# Patient Record
Sex: Male | Born: 1965
Health system: Southern US, Community
[De-identification: ages and names within clinical notes are randomized; demographics above are authoritative.]

## PROBLEM LIST (undated history)

## (undated) DIAGNOSIS — M25559 Pain in unspecified hip: Secondary | ICD-10-CM

## (undated) DIAGNOSIS — I7300 Raynaud's syndrome without gangrene: Secondary | ICD-10-CM

## (undated) DIAGNOSIS — E119 Type 2 diabetes mellitus without complications: Secondary | ICD-10-CM

## (undated) DIAGNOSIS — F419 Anxiety disorder, unspecified: Secondary | ICD-10-CM

## (undated) DIAGNOSIS — G4730 Sleep apnea, unspecified: Secondary | ICD-10-CM

## (undated) DIAGNOSIS — R0600 Dyspnea, unspecified: Secondary | ICD-10-CM

## (undated) DIAGNOSIS — I10 Essential (primary) hypertension: Secondary | ICD-10-CM

## (undated) DIAGNOSIS — M1990 Unspecified osteoarthritis, unspecified site: Secondary | ICD-10-CM

## (undated) HISTORY — DX: Anxiety disorder, unspecified: F41.9

## (undated) HISTORY — DX: Essential (primary) hypertension: I10

## (undated) HISTORY — DX: Raynaud's syndrome without gangrene: I73.00

## (undated) HISTORY — PX: I & D EXTREMITY: SHX5045

---

## 2004-08-27 ENCOUNTER — Ambulatory Visit: Payer: Self-pay | Admitting: *Deleted

## 2004-08-28 ENCOUNTER — Ambulatory Visit: Payer: Self-pay | Admitting: *Deleted

## 2005-08-24 ENCOUNTER — Emergency Department: Payer: Self-pay | Admitting: Emergency Medicine

## 2005-08-26 ENCOUNTER — Emergency Department: Payer: Self-pay | Admitting: Emergency Medicine

## 2012-10-27 HISTORY — PX: OPEN REDUCTION INTERNAL FIXATION (ORIF) TIBIA/FIBULA FRACTURE: SHX5992

## 2012-11-30 ENCOUNTER — Ambulatory Visit: Payer: Self-pay

## 2012-12-02 DIAGNOSIS — S92009A Unspecified fracture of unspecified calcaneus, initial encounter for closed fracture: Secondary | ICD-10-CM | POA: Insufficient documentation

## 2012-12-02 DIAGNOSIS — S82873A Displaced pilon fracture of unspecified tibia, initial encounter for closed fracture: Secondary | ICD-10-CM | POA: Insufficient documentation

## 2013-02-01 ENCOUNTER — Encounter: Payer: Self-pay | Admitting: Surgery

## 2013-02-05 LAB — WOUND AEROBIC CULTURE

## 2013-02-27 ENCOUNTER — Encounter: Payer: Self-pay | Admitting: Surgery

## 2013-03-29 ENCOUNTER — Encounter: Payer: Self-pay | Admitting: Surgery

## 2013-04-08 DIAGNOSIS — G5620 Lesion of ulnar nerve, unspecified upper limb: Secondary | ICD-10-CM | POA: Insufficient documentation

## 2013-05-11 DIAGNOSIS — G5620 Lesion of ulnar nerve, unspecified upper limb: Secondary | ICD-10-CM | POA: Insufficient documentation

## 2013-05-18 ENCOUNTER — Ambulatory Visit: Payer: Self-pay | Admitting: Orthopaedic Surgery

## 2013-06-01 DIAGNOSIS — S82209A Unspecified fracture of shaft of unspecified tibia, initial encounter for closed fracture: Secondary | ICD-10-CM | POA: Insufficient documentation

## 2013-09-07 DIAGNOSIS — M869 Osteomyelitis, unspecified: Secondary | ICD-10-CM | POA: Insufficient documentation

## 2013-09-07 DIAGNOSIS — Z792 Long term (current) use of antibiotics: Secondary | ICD-10-CM | POA: Insufficient documentation

## 2013-10-05 ENCOUNTER — Ambulatory Visit: Payer: Self-pay | Admitting: Orthopaedic Surgery

## 2014-01-28 ENCOUNTER — Emergency Department: Payer: Self-pay | Admitting: Emergency Medicine

## 2014-01-28 LAB — PROTIME-INR
INR: 0.9
Prothrombin Time: 12.2 secs (ref 11.5–14.7)

## 2014-01-28 LAB — COMPREHENSIVE METABOLIC PANEL
ALK PHOS: 143 U/L — AB
ALT: 36 U/L
Albumin: 3.5 g/dL (ref 3.4–5.0)
Anion Gap: 10 (ref 7–16)
BUN: 8 mg/dL (ref 7–18)
Bilirubin,Total: 0.8 mg/dL (ref 0.2–1.0)
CHLORIDE: 103 mmol/L (ref 98–107)
CREATININE: 0.8 mg/dL (ref 0.60–1.30)
Calcium, Total: 9.7 mg/dL (ref 8.5–10.1)
Co2: 27 mmol/L (ref 21–32)
GLUCOSE: 91 mg/dL (ref 65–99)
OSMOLALITY: 277 (ref 275–301)
Potassium: 4.1 mmol/L (ref 3.5–5.1)
SGOT(AST): 22 U/L (ref 15–37)
Sodium: 140 mmol/L (ref 136–145)
TOTAL PROTEIN: 7.4 g/dL (ref 6.4–8.2)

## 2014-01-28 LAB — CBC
HCT: 47.9 % (ref 40.0–52.0)
HGB: 16.2 g/dL (ref 13.0–18.0)
MCH: 33.6 pg (ref 26.0–34.0)
MCHC: 33.8 g/dL (ref 32.0–36.0)
MCV: 100 fL (ref 80–100)
Platelet: 202 10*3/uL (ref 150–440)
RBC: 4.81 10*6/uL (ref 4.40–5.90)
RDW: 13.9 % (ref 11.5–14.5)
WBC: 9.7 10*3/uL (ref 3.8–10.6)

## 2014-01-28 LAB — APTT: Activated PTT: 28.1 secs (ref 23.6–35.9)

## 2014-02-18 ENCOUNTER — Encounter: Payer: Self-pay | Admitting: Vascular Surgery

## 2014-02-18 ENCOUNTER — Other Ambulatory Visit: Payer: Self-pay | Admitting: *Deleted

## 2014-02-18 DIAGNOSIS — I82402 Acute embolism and thrombosis of unspecified deep veins of left lower extremity: Secondary | ICD-10-CM

## 2014-03-28 ENCOUNTER — Encounter: Payer: Self-pay | Admitting: Vascular Surgery

## 2014-03-29 ENCOUNTER — Encounter: Payer: Self-pay | Admitting: Vascular Surgery

## 2014-03-29 ENCOUNTER — Other Ambulatory Visit (HOSPITAL_COMMUNITY): Payer: Self-pay

## 2014-04-18 ENCOUNTER — Encounter: Payer: Self-pay | Admitting: Vascular Surgery

## 2014-04-19 ENCOUNTER — Encounter: Payer: Self-pay | Admitting: Vascular Surgery

## 2014-04-19 ENCOUNTER — Other Ambulatory Visit (HOSPITAL_COMMUNITY): Payer: Self-pay

## 2014-09-30 ENCOUNTER — Other Ambulatory Visit: Payer: Self-pay | Admitting: Family Medicine

## 2014-09-30 ENCOUNTER — Ambulatory Visit (INDEPENDENT_AMBULATORY_CARE_PROVIDER_SITE_OTHER): Payer: BLUE CROSS/BLUE SHIELD | Admitting: Family Medicine

## 2014-09-30 ENCOUNTER — Encounter: Payer: Self-pay | Admitting: Family Medicine

## 2014-09-30 VITALS — BP 160/91 | HR 90 | Temp 97.6°F | Ht 74.0 in | Wt 230.0 lb

## 2014-09-30 DIAGNOSIS — I82402 Acute embolism and thrombosis of unspecified deep veins of left lower extremity: Secondary | ICD-10-CM | POA: Diagnosis not present

## 2014-09-30 DIAGNOSIS — L02419 Cutaneous abscess of limb, unspecified: Secondary | ICD-10-CM

## 2014-09-30 DIAGNOSIS — S82201F Unspecified fracture of shaft of right tibia, subsequent encounter for open fracture type IIIA, IIIB, or IIIC with routine healing: Secondary | ICD-10-CM | POA: Diagnosis not present

## 2014-09-30 DIAGNOSIS — L03119 Cellulitis of unspecified part of limb: Secondary | ICD-10-CM | POA: Diagnosis not present

## 2014-09-30 DIAGNOSIS — S82201C Unspecified fracture of shaft of right tibia, initial encounter for open fracture type IIIA, IIIB, or IIIC: Secondary | ICD-10-CM | POA: Insufficient documentation

## 2014-09-30 DIAGNOSIS — F411 Generalized anxiety disorder: Secondary | ICD-10-CM | POA: Diagnosis not present

## 2014-09-30 DIAGNOSIS — I10 Essential (primary) hypertension: Secondary | ICD-10-CM | POA: Insufficient documentation

## 2014-09-30 DIAGNOSIS — I7300 Raynaud's syndrome without gangrene: Secondary | ICD-10-CM | POA: Insufficient documentation

## 2014-09-30 DIAGNOSIS — E1159 Type 2 diabetes mellitus with other circulatory complications: Secondary | ICD-10-CM | POA: Insufficient documentation

## 2014-09-30 DIAGNOSIS — F17210 Nicotine dependence, cigarettes, uncomplicated: Secondary | ICD-10-CM | POA: Insufficient documentation

## 2014-09-30 DIAGNOSIS — I152 Hypertension secondary to endocrine disorders: Secondary | ICD-10-CM | POA: Insufficient documentation

## 2014-09-30 DIAGNOSIS — F17200 Nicotine dependence, unspecified, uncomplicated: Secondary | ICD-10-CM | POA: Insufficient documentation

## 2014-09-30 MED ORDER — SERTRALINE HCL 50 MG PO TABS
50.0000 mg | ORAL_TABLET | Freq: Every day | ORAL | Status: DC
Start: 2014-09-30 — End: 2015-04-04

## 2014-09-30 MED ORDER — CLONAZEPAM 0.5 MG PO TABS: 0.5000 mg | ORAL_TABLET | Freq: Three times a day (TID) | ORAL | Status: DC | PRN

## 2014-09-30 NOTE — Assessment & Plan Note (Signed)
Cont current meds as doing well give refills

## 2014-09-30 NOTE — Assessment & Plan Note (Signed)
Has been over 6 mo will d/c meds and obs for new DVT sx

## 2014-09-30 NOTE — Progress Notes (Signed)
BP 160/91 mmHg  Pulse 90  Temp(Src) 97.6 F (36.4 C)  Ht 6\' 2"  (1.88 m)  Wt 230 lb (104.327 kg)  BMI 29.52 kg/m2  SpO2 93%   Subjective:    Patient ID: Harold Tate, male    DOB: 1965-05-15, 49 y.o.   MRN: [id number]  HPI: Harold Tate is a 49 y.o. male presenting on 09/30/2014 for Insect Bite; Depression; and DVT  SKIN LESION Duration:3 days Location: Right lower leg  Painful: no Itching: no Onset: gradual Context: not changing Associated signs and symptoms:  History of skin cancer: yes History of precancerous skin lesions: no Family history of skin cancer: no Pt also doing well anxiety and depression No issues with meds Has not had f/u for DVT and still taking xarelto HPI  Relevant past medical, surgical, family and social history reviewed and updated as indicated. Interim medical history since our last visit reviewed. Allergies and medications reviewed and updated.  No current outpatient prescriptions on file prior to visit.   No current facility-administered medications on file prior to visit.    Review of Systems  Constitutional: Negative.   Respiratory: Negative.   Cardiovascular: Negative.     Per HPI unless specifically indicated above     Objective:    BP 160/91 mmHg  Pulse 90  Temp(Src) 97.6 F (36.4 C)  Ht 6\' 2"  (1.88 m)  Wt 230 lb (104.327 kg)  BMI 29.52 kg/m2  SpO2 93%  Wt Readings from Last 3 Encounters:  09/30/14 230 lb (104.327 kg)  04/26/14 234 lb (106.142 kg)    Physical Exam  Constitutional: He is oriented to person, place, and time. He appears well-developed and well-nourished. No distress.  HENT:  Head: Normocephalic and atraumatic.  Right Ear: Hearing normal.  Left Ear: Hearing normal.  Nose: Nose normal.  Eyes: Conjunctivae and lids are normal. Right eye exhibits no discharge. Left eye exhibits no discharge. No scleral icterus.  Cardiovascular: Normal rate, regular rhythm and normal heart sounds.   Pulmonary/Chest:  Effort normal and breath sounds normal. No respiratory distress.  Musculoskeletal: Normal range of motion.  Neurological: He is alert and oriented to person, place, and time.  Skin: Skin is intact. No rash noted.     Has resolving blister rt ant leg slight redness  Psychiatric: He has a normal mood and affect. His speech is normal and behavior is normal. Judgment and thought content normal. Cognition and memory are normal.        Assessment & Plan:   Problem List Items Addressed This Visit      Cardiovascular and Mediastinum   Deep vein thrombosis of left lower limb (Chronic)    Has been over 6 mo will d/c meds and obs for new DVT sx        Musculoskeletal and Integument   Open fracture of shaft of right tibia, type IIIA, IIIB, or IIIC (Chronic)   Relevant Medications   clindamycin (CLEOCIN) 150 MG capsule     Other   Generalized anxiety disorder (Chronic)    Cont current meds as doing well give refills      Relevant Medications   clonazePAM (KLONOPIN) 0.5 MG tablet   sertraline (ZOLOFT) 50 MG tablet    Other Visit Diagnoses    Cellulitis and abscess of leg    -  Primary    discuss skin care and infection will cont hot sokes and neosporin          Follow up plan:  Return in about 3 months (around 12/31/2014), or if symptoms worsen or fail to improve, for Physical Exam.

## 2015-01-30 ENCOUNTER — Encounter: Payer: BLUE CROSS/BLUE SHIELD | Admitting: Family Medicine

## 2015-04-04 ENCOUNTER — Ambulatory Visit (INDEPENDENT_AMBULATORY_CARE_PROVIDER_SITE_OTHER): Payer: BLUE CROSS/BLUE SHIELD | Admitting: Family Medicine

## 2015-04-04 ENCOUNTER — Encounter: Payer: Self-pay | Admitting: Family Medicine

## 2015-04-04 VITALS — BP 148/97 | HR 97 | Temp 97.6°F | Ht 72.6 in | Wt 217.0 lb

## 2015-04-04 DIAGNOSIS — Z0000 Encounter for general adult medical examination without abnormal findings: Secondary | ICD-10-CM | POA: Diagnosis not present

## 2015-04-04 DIAGNOSIS — F411 Generalized anxiety disorder: Secondary | ICD-10-CM

## 2015-04-04 LAB — URINALYSIS, ROUTINE W REFLEX MICROSCOPIC
Bilirubin, UA: NEGATIVE
Glucose, UA: NEGATIVE
NITRITE UA: NEGATIVE
PH UA: 7 (ref 5.0–7.5)
Protein, UA: NEGATIVE
RBC UA: NEGATIVE
Specific Gravity, UA: 1.02 (ref 1.005–1.030)
UUROB: 1 mg/dL (ref 0.2–1.0)

## 2015-04-04 LAB — MICROSCOPIC EXAMINATION

## 2015-04-04 MED ORDER — SERTRALINE HCL 50 MG PO TABS: 50.0000 mg | ORAL_TABLET | Freq: Every day | ORAL | Status: DC

## 2015-04-04 MED ORDER — HYDROXYZINE HCL 25 MG PO TABS: 25.0000 mg | ORAL_TABLET | Freq: Every evening | ORAL | Status: DC | PRN

## 2015-04-04 MED ORDER — CLONAZEPAM 0.5 MG PO TABS: 0.5000 mg | ORAL_TABLET | Freq: Three times a day (TID) | ORAL | Status: DC | PRN

## 2015-04-04 NOTE — Progress Notes (Signed)
BP 148/97 mmHg  Pulse 97  Temp(Src) 97.6 F (36.4 C)  Ht 6' 0.6" (1.844 m)  Wt 217 lb (98.431 kg)  BMI 28.95 kg/m2  SpO2 95%   Subjective:    Patient ID: Harold Tate, male    DOB: 1965/06/11, 49 y.o.   MRN: [id number]  HPI: Harold Tate is a 49 y.o. male  Chief Complaint  Patient presents with  . Annual Exam   patient doing well with anxiety takes clonazepam 3 times a day but is been running out Patient's taking Zoloft doing well Still taking clindamycin to prevent staph infection taking once a day This medicine as prescribed by infectious disease  Relevant past medical, surgical, family and social history reviewed and updated as indicated. Interim medical history since our last visit reviewed. Allergies and medications reviewed and updated.  Review of Systems  Constitutional: Negative.   HENT: Negative.   Eyes: Negative.   Respiratory: Negative.   Cardiovascular: Negative.   Gastrointestinal: Negative.   Endocrine: Negative.   Genitourinary: Negative.   Musculoskeletal: Negative.   Skin: Negative.   Allergic/Immunologic: Negative.   Neurological: Negative.   Hematological: Negative.   Psychiatric/Behavioral: Negative.     Per HPI unless specifically indicated above     Objective:    BP 148/97 mmHg  Pulse 97  Temp(Src) 97.6 F (36.4 C)  Ht 6' 0.6" (1.844 m)  Wt 217 lb (98.431 kg)  BMI 28.95 kg/m2  SpO2 95%  Wt Readings from Last 3 Encounters:  04/04/15 217 lb (98.431 kg)  09/30/14 230 lb (104.327 kg)  04/26/14 234 lb (106.142 kg)    Physical Exam  Constitutional: He is oriented to person, place, and time. He appears well-developed and well-nourished.  HENT:  Head: Normocephalic and atraumatic.  Right Ear: External ear normal.  Left Ear: External ear normal.  Eyes: Conjunctivae and EOM are normal. Pupils are equal, round, and reactive to light.  Neck: Normal range of motion. Neck supple.  Cardiovascular: Normal rate, regular rhythm, normal  heart sounds and intact distal pulses.   Pulmonary/Chest: Effort normal and breath sounds normal.  Abdominal: Soft. Bowel sounds are normal. There is no splenomegaly or hepatomegaly.  Genitourinary: Rectum normal, prostate normal and penis normal.  Musculoskeletal: Normal range of motion.  Neurological: He is alert and oriented to person, place, and time. He has normal reflexes.  Skin: No rash noted. No erythema.  Psychiatric: He has a normal mood and affect. His behavior is normal. Judgment and thought content normal.        Assessment & Plan:   Problem List Items Addressed This Visit      Other   Generalized anxiety disorder - Primary (Chronic)    Discuss medications cautions about using clonazepam and taking too much will stick to 90 tablets Discuss if needing more inadequate therapy will consider seeing psychiatry. We'll continue Zoloft for now      Relevant Medications   clonazePAM (KLONOPIN) 0.5 MG tablet   sertraline (ZOLOFT) 50 MG tablet   Other Relevant Orders   Comprehensive metabolic panel   Lipid panel   CBC with Differential/Platelet   PSA   Urinalysis, Routine w reflex microscopic (not at [facility])   TSH    Other Visit Diagnoses    PE (physical exam), annual        Relevant Orders    Comprehensive metabolic panel    Lipid panel    CBC with Differential/Platelet    PSA    Urinalysis,  Routine w reflex microscopic (not at [facility])    TSH        Follow up plan: Return in about 6 months (around 10/03/2015), or if symptoms worsen or fail to improve, for Med check.

## 2015-04-04 NOTE — Assessment & Plan Note (Signed)
Discuss medications cautions about using clonazepam and taking too much will stick to 90 tablets Discuss if needing more inadequate therapy will consider seeing psychiatry. We'll continue Zoloft for now

## 2015-04-05 ENCOUNTER — Telehealth: Payer: Self-pay | Admitting: Family Medicine

## 2015-04-05 LAB — CBC WITH DIFFERENTIAL/PLATELET
Basophils Absolute: 0.1 10*3/uL (ref 0.0–0.2)
Basos: 1 %
EOS (ABSOLUTE): 0.1 10*3/uL (ref 0.0–0.4)
EOS: 1 %
HEMOGLOBIN: 15.6 g/dL (ref 12.6–17.7)
Hematocrit: 45 % (ref 37.5–51.0)
IMMATURE GRANS (ABS): 0 10*3/uL (ref 0.0–0.1)
IMMATURE GRANULOCYTES: 0 %
Lymphocytes Absolute: 1.7 10*3/uL (ref 0.7–3.1)
Lymphs: 25 %
MCH: 34.3 pg — ABNORMAL HIGH (ref 26.6–33.0)
MCHC: 34.7 g/dL (ref 31.5–35.7)
MCV: 99 fL — ABNORMAL HIGH (ref 79–97)
MONOCYTES: 9 %
Monocytes Absolute: 0.6 10*3/uL (ref 0.1–0.9)
NEUTROS PCT: 64 %
Neutrophils Absolute: 4.3 10*3/uL (ref 1.4–7.0)
PLATELETS: 236 10*3/uL (ref 150–379)
RBC: 4.55 x10E6/uL (ref 4.14–5.80)
RDW: 13.5 % (ref 12.3–15.4)
WBC: 6.7 10*3/uL (ref 3.4–10.8)

## 2015-04-05 LAB — LIPID PANEL
Chol/HDL Ratio: 3.6 ratio units (ref 0.0–5.0)
Cholesterol, Total: 247 mg/dL — ABNORMAL HIGH (ref 100–199)
HDL: 68 mg/dL (ref >39)
LDL Calculated: 160 mg/dL — ABNORMAL HIGH (ref 0–99)
TRIGLYCERIDES: 93 mg/dL (ref 0–149)
VLDL Cholesterol Cal: 19 mg/dL (ref 5–40)

## 2015-04-05 LAB — COMPREHENSIVE METABOLIC PANEL
ALT: 27 IU/L (ref 0–44)
AST: 25 IU/L (ref 0–40)
Albumin/Globulin Ratio: 1.7 (ref 1.1–2.5)
Albumin: 4.5 g/dL (ref 3.5–5.5)
Alkaline Phosphatase: 84 IU/L (ref 39–117)
BILIRUBIN TOTAL: 0.7 mg/dL (ref 0.0–1.2)
BUN / CREAT RATIO: 12 (ref 9–20)
BUN: 10 mg/dL (ref 6–24)
CHLORIDE: 97 mmol/L (ref 97–106)
CO2: 26 mmol/L (ref 18–29)
Calcium: 9.7 mg/dL (ref 8.7–10.2)
Creatinine, Ser: 0.82 mg/dL (ref 0.76–1.27)
GFR calc Af Amer: 120 mL/min/{1.73_m2} (ref >59)
GFR calc non Af Amer: 104 mL/min/{1.73_m2} (ref >59)
GLOBULIN, TOTAL: 2.7 g/dL (ref 1.5–4.5)
GLUCOSE: 105 mg/dL — AB (ref 65–99)
Potassium: 4.6 mmol/L (ref 3.5–5.2)
Sodium: 139 mmol/L (ref 136–144)
Total Protein: 7.2 g/dL (ref 6.0–8.5)

## 2015-04-05 LAB — TSH: TSH: 1.5 u[IU]/mL (ref 0.450–4.500)

## 2015-04-05 LAB — PSA: Prostate Specific Ag, Serum: 1.7 ng/mL (ref 0.0–4.0)

## 2015-04-05 NOTE — Telephone Encounter (Signed)
Phone call Discussed with patient elevated cholesterol patient will do better with diet exercise nutrition

## 2015-04-05 NOTE — Telephone Encounter (Signed)
-----   Message from Wynn Maudlin, [place] sent at 04/05/2015  5:14 PM EST ----- labs

## 2015-09-27 ENCOUNTER — Other Ambulatory Visit: Payer: Self-pay

## 2015-09-27 DIAGNOSIS — F411 Generalized anxiety disorder: Secondary | ICD-10-CM

## 2015-09-27 MED ORDER — CLONAZEPAM 0.5 MG PO TABS: 0.5000 mg | ORAL_TABLET | Freq: Three times a day (TID) | ORAL | Status: DC | PRN

## 2015-10-03 ENCOUNTER — Ambulatory Visit: Payer: BLUE CROSS/BLUE SHIELD | Admitting: Family Medicine

## 2015-11-02 ENCOUNTER — Encounter: Payer: Self-pay | Admitting: Family Medicine

## 2015-11-02 ENCOUNTER — Ambulatory Visit (INDEPENDENT_AMBULATORY_CARE_PROVIDER_SITE_OTHER): Payer: BLUE CROSS/BLUE SHIELD | Admitting: Family Medicine

## 2015-11-02 VITALS — BP 130/85 | HR 105 | Temp 98.3°F | Ht 74.0 in | Wt 225.0 lb

## 2015-11-02 DIAGNOSIS — F411 Generalized anxiety disorder: Secondary | ICD-10-CM

## 2015-11-02 MED ORDER — CLONAZEPAM 0.5 MG PO TABS
0.5000 mg | ORAL_TABLET | Freq: Three times a day (TID) | ORAL | Status: DC | PRN
Start: 2015-11-02 — End: 2015-12-04

## 2015-11-02 MED ORDER — HYDROXYZINE HCL 25 MG PO TABS: 25.0000 mg | ORAL_TABLET | Freq: Every evening | ORAL | Status: DC | PRN

## 2015-11-02 MED ORDER — SERTRALINE HCL 100 MG PO TABS: 100.0000 mg | ORAL_TABLET | Freq: Every day | ORAL | Status: DC

## 2015-11-02 NOTE — Progress Notes (Signed)
BP 130/85 mmHg  Pulse 105  Temp(Src) 98.3 F (36.8 C)  Ht 6\' 2"  (1.88 m)  Wt 225 lb (102.059 kg)  BMI 28.88 kg/m2  SpO2 97%   Subjective:    Patient ID: Harold Tate, male    DOB: April 12, 1966, 50 y.o.   MRN: [id number]  HPI: Harold Tate is a 50 y.o. male  Chief Complaint  Patient presents with  . Anxiety   ANXIETY/STRESS- ran out of his klonopin about a week ago. Takes his wife's xanax when he is out of his medicine.  Duration:uncontrolled Anxious mood: yes  Excessive worrying: yes Irritability: no  Sweating: yes Nausea: yes Palpitations:yes Hyperventilation: no Panic attacks: yes Agoraphobia: no  Obscessions/compulsions: no Depressed mood: no GAD 7 : Generalized Anxiety Score 11/02/2015  Nervous, Anxious, on Edge 3  Control/stop worrying 3  Worry too much - different things 3  Trouble relaxing 3  Restless 0  Easily annoyed or irritable 2  Afraid - awful might happen 0  Total GAD 7 Score 14  Anxiety Difficulty Somewhat difficult   Anhedonia: no Weight changes: no Insomnia: no   Hypersomnia: no Fatigue/loss of energy: yes Feelings of worthlessness: no Feelings of guilt: no Impaired concentration/indecisiveness: yes Suicidal ideations: no  Crying spells: no Recent Stressors/Life Changes: yes   Relationship problems: yes   Family stress: yes     Financial stress: yes    Job stress: yes    Recent death/loss: no  Relevant past medical, surgical, family and social history reviewed and updated as indicated. Interim medical history since our last visit reviewed. Allergies and medications reviewed and updated.  Review of Systems  Constitutional: Negative.   Respiratory: Negative.   Cardiovascular: Negative.   Psychiatric/Behavioral: Negative.     Per HPI unless specifically indicated above     Objective:    BP 130/85 mmHg  Pulse 105  Temp(Src) 98.3 F (36.8 C)  Ht 6\' 2"  (1.88 m)  Wt 225 lb (102.059 kg)  BMI 28.88 kg/m2  SpO2 97%  Wt Readings  from Last 3 Encounters:  11/02/15 225 lb (102.059 kg)  04/04/15 217 lb (98.431 kg)  09/30/14 230 lb (104.327 kg)    Physical Exam  Constitutional: He is oriented to person, place, and time. He appears well-developed and well-nourished. No distress.  HENT:  Head: Normocephalic and atraumatic.  Right Ear: Hearing normal.  Left Ear: Hearing normal.  Nose: Nose normal.  Eyes: Conjunctivae and lids are normal. Right eye exhibits no discharge. Left eye exhibits no discharge. No scleral icterus.  Cardiovascular: Normal rate, regular rhythm, normal heart sounds and intact distal pulses.  Exam reveals no gallop and no friction rub.   No murmur heard. Pulmonary/Chest: Effort normal and breath sounds normal. No respiratory distress. He has no wheezes. He has no rales. He exhibits no tenderness.  Musculoskeletal: Normal range of motion.  Neurological: He is alert and oriented to person, place, and time.  Skin: Skin is warm, dry and intact. No rash noted. No erythema. No pallor.  Psychiatric: His speech is normal and behavior is normal. Judgment and thought content normal. His mood appears anxious. Cognition and memory are normal.  Nursing note and vitals reviewed.   Results for orders placed or performed in visit on 04/04/15  Microscopic Examination  Result Value Ref Range   WBC, UA 6-10 (A) 0 -  5 /hpf   RBC, UA 0-2 0 -  2 /hpf   Epithelial Cells (non renal) 0-10 0 - 10 /hpf  Bacteria, UA Few None seen/Few  Comprehensive metabolic panel  Result Value Ref Range   Glucose 105 (H) 65 - 99 mg/dL   BUN 10 6 - 24 mg/dL   Creatinine, Ser 0.82 0.76 - 1.27 mg/dL   GFR calc non Af Amer 104 >59 mL/min/1.73   GFR calc Af Amer 120 >59 mL/min/1.73   BUN/Creatinine Ratio 12 9 - 20   Sodium 139 136 - 144 mmol/L   Potassium 4.6 3.5 - 5.2 mmol/L   Chloride 97 97 - 106 mmol/L   CO2 26 18 - 29 mmol/L   Calcium 9.7 8.7 - 10.2 mg/dL   Total Protein 7.2 6.0 - 8.5 g/dL   Albumin 4.5 3.5 - 5.5 g/dL    Globulin, Total 2.7 1.5 - 4.5 g/dL   Albumin/Globulin Ratio 1.7 1.1 - 2.5   Bilirubin Total 0.7 0.0 - 1.2 mg/dL   Alkaline Phosphatase 84 39 - 117 IU/L   AST 25 0 - 40 IU/L   ALT 27 0 - 44 IU/L  Lipid panel  Result Value Ref Range   Cholesterol, Total 247 (H) 100 - 199 mg/dL   Triglycerides 93 0 - 149 mg/dL   HDL 68 >39 mg/dL   VLDL Cholesterol Cal 19 5 - 40 mg/dL   LDL Calculated 160 (H) 0 - 99 mg/dL   Chol/HDL Ratio 3.6 0.0 - 5.0 ratio units  CBC with Differential/Platelet  Result Value Ref Range   WBC 6.7 3.4 - 10.8 x10E3/uL   RBC 4.55 4.14 - 5.80 x10E6/uL   Hemoglobin 15.6 12.6 - 17.7 g/dL   Hematocrit 45.0 37.5 - 51.0 %   MCV 99 (H) 79 - 97 fL   MCH 34.3 (H) 26.6 - 33.0 pg   MCHC 34.7 31.5 - 35.7 g/dL   RDW 13.5 12.3 - 15.4 %   Platelets 236 150 - 379 x10E3/uL   Neutrophils 64 %   Lymphs 25 %   Monocytes 9 %   Eos 1 %   Basos 1 %   Neutrophils Absolute 4.3 1.4 - 7.0 x10E3/uL   Lymphocytes Absolute 1.7 0.7 - 3.1 x10E3/uL   Monocytes Absolute 0.6 0.1 - 0.9 x10E3/uL   EOS (ABSOLUTE) 0.1 0.0 - 0.4 x10E3/uL   Basophils Absolute 0.1 0.0 - 0.2 x10E3/uL   Immature Granulocytes 0 %   Immature Grans (Abs) 0.0 0.0 - 0.1 x10E3/uL  PSA  Result Value Ref Range   Prostate Specific Ag, Serum 1.7 0.0 - 4.0 ng/mL  Urinalysis, Routine w reflex microscopic (not at [facility])  Result Value Ref Range   Specific Gravity, UA 1.020 1.005 - 1.030   pH, UA 7.0 5.0 - 7.5   Color, UA Yellow Yellow   Appearance Ur Clear Clear   Leukocytes, UA Trace (A) Negative   Protein, UA Negative Negative/Trace   Glucose, UA Negative Negative   Ketones, UA 1+ (A) Negative   RBC, UA Negative Negative   Bilirubin, UA Negative Negative   Urobilinogen, Ur 1.0 0.2 - 1.0 mg/dL   Nitrite, UA Negative Negative   Microscopic Examination See below:   TSH  Result Value Ref Range   TSH 1.500 0.450 - 4.500 uIU/mL      Assessment & Plan:   Problem List Items Addressed This Visit      Other   Generalized  anxiety disorder - Primary (Chronic)    Not under good control. Will increase his zoloft to 100mg  daily and refill his klonopin and his hydroxyzine. Will check back in with  regular PCP in 1 month.       Relevant Medications   sertraline (ZOLOFT) 100 MG tablet   clonazePAM (KLONOPIN) 0.5 MG tablet       Follow up plan: Return As scheduled with [facility].

## 2015-11-02 NOTE — Assessment & Plan Note (Signed)
Not under good control. Will increase his zoloft to 100mg  daily and refill his klonopin and his hydroxyzine. Will check back in with regular PCP in 1 month.

## 2015-12-04 ENCOUNTER — Other Ambulatory Visit: Payer: Self-pay | Admitting: Family Medicine

## 2015-12-04 ENCOUNTER — Telehealth: Payer: Self-pay | Admitting: Family Medicine

## 2015-12-04 ENCOUNTER — Ambulatory Visit (INDEPENDENT_AMBULATORY_CARE_PROVIDER_SITE_OTHER): Payer: BLUE CROSS/BLUE SHIELD | Admitting: Family Medicine

## 2015-12-04 ENCOUNTER — Encounter: Payer: Self-pay | Admitting: Family Medicine

## 2015-12-04 DIAGNOSIS — I10 Essential (primary) hypertension: Secondary | ICD-10-CM

## 2015-12-04 DIAGNOSIS — F411 Generalized anxiety disorder: Secondary | ICD-10-CM

## 2015-12-04 MED ORDER — BENAZEPRIL HCL 20 MG PO TABS: 20.0000 mg | ORAL_TABLET | Freq: Every day | ORAL | 3 refills | Status: DC

## 2015-12-04 MED ORDER — CLONAZEPAM 0.5 MG PO TABS: 0.5000 mg | ORAL_TABLET | Freq: Three times a day (TID) | ORAL | 5 refills | Status: DC | PRN

## 2015-12-04 NOTE — Progress Notes (Signed)
BP (!) 142/89 (BP Location: Right Arm, Patient Position: Sitting, Cuff Size: Normal)   Pulse 93   Temp 98.1 F (36.7 C)   Ht 6\' 2"  (1.88 m)   Wt 229 lb (103.9 kg)   SpO2 97%   BMI 29.40 kg/m    Subjective:    Patient ID: Harold Tate, male    DOB: 09/05/1965, 50 y.o.   MRN: [id number]  HPI: Harold Tate is a 50 y.o. male  Follow-up depression and anxiety Patient feels depression is resolved wants to stop Zoloft due to sexual function side effects Klonopin 0.5 mg 3 times a day does well controlling his nerves anxiety. Patient's blood pressures been elevated the last several visits discussed hypertension treatment starting medications patient very reluctant discussed risks benefits. Discussed nonpharmacologic treatment with weight loss decrease beer consumption salt reduction.   Relevant past medical, surgical, family and social history reviewed and updated as indicated. Interim medical history since our last visit reviewed. Allergies and medications reviewed and updated.  Review of Systems  Constitutional: Negative.   Respiratory: Negative.   Cardiovascular: Negative.     Per HPI unless specifically indicated above     Objective:    BP (!) 142/89 (BP Location: Right Arm, Patient Position: Sitting, Cuff Size: Normal)   Pulse 93   Temp 98.1 F (36.7 C)   Ht 6\' 2"  (1.88 m)   Wt 229 lb (103.9 kg)   SpO2 97%   BMI 29.40 kg/m   Wt Readings from Last 3 Encounters:  12/04/15 229 lb (103.9 kg)  11/02/15 225 lb (102.1 kg)  04/04/15 217 lb (98.4 kg)    Physical Exam  Constitutional: He is oriented to person, place, and time. He appears well-developed and well-nourished. No distress.  HENT:  Head: Normocephalic and atraumatic.  Right Ear: Hearing normal.  Left Ear: Hearing normal.  Nose: Nose normal.  Eyes: Conjunctivae and lids are normal. Right eye exhibits no discharge. Left eye exhibits no discharge. No scleral icterus.  Cardiovascular: Normal rate, regular  rhythm and normal heart sounds.   Pulmonary/Chest: Effort normal. No respiratory distress.  Musculoskeletal: Normal range of motion.  Neurological: He is alert and oriented to person, place, and time.  Skin: Skin is intact. No rash noted.  Psychiatric: He has a normal mood and affect. His speech is normal and behavior is normal. Judgment and thought content normal. Cognition and memory are normal.    Results for orders placed or performed in visit on 04/04/15  Microscopic Examination  Result Value Ref Range   WBC, UA 6-10 (A) 0 - 5 /hpf   RBC, UA 0-2 0 - 2 /hpf   Epithelial Cells (non renal) 0-10 0 - 10 /hpf   Bacteria, UA Few None seen/Few  Comprehensive metabolic panel  Result Value Ref Range   Glucose 105 (H) 65 - 99 mg/dL   BUN 10 6 - 24 mg/dL   Creatinine, Ser 0.82 0.76 - 1.27 mg/dL   GFR calc non Af Amer 104 >59 mL/min/1.73   GFR calc Af Amer 120 >59 mL/min/1.73   BUN/Creatinine Ratio 12 9 - 20   Sodium 139 136 - 144 mmol/L   Potassium 4.6 3.5 - 5.2 mmol/L   Chloride 97 97 - 106 mmol/L   CO2 26 18 - 29 mmol/L   Calcium 9.7 8.7 - 10.2 mg/dL   Total Protein 7.2 6.0 - 8.5 g/dL   Albumin 4.5 3.5 - 5.5 g/dL   Globulin, Total 2.7 1.5 -  4.5 g/dL   Albumin/Globulin Ratio 1.7 1.1 - 2.5   Bilirubin Total 0.7 0.0 - 1.2 mg/dL   Alkaline Phosphatase 84 39 - 117 IU/L   AST 25 0 - 40 IU/L   ALT 27 0 - 44 IU/L  Lipid panel  Result Value Ref Range   Cholesterol, Total 247 (H) 100 - 199 mg/dL   Triglycerides 93 0 - 149 mg/dL   HDL 68 >39 mg/dL   VLDL Cholesterol Cal 19 5 - 40 mg/dL   LDL Calculated 160 (H) 0 - 99 mg/dL   Chol/HDL Ratio 3.6 0.0 - 5.0 ratio units  CBC with Differential/Platelet  Result Value Ref Range   WBC 6.7 3.4 - 10.8 x10E3/uL   RBC 4.55 4.14 - 5.80 x10E6/uL   Hemoglobin 15.6 12.6 - 17.7 g/dL   Hematocrit 45.0 37.5 - 51.0 %   MCV 99 (H) 79 - 97 fL   MCH 34.3 (H) 26.6 - 33.0 pg   MCHC 34.7 31.5 - 35.7 g/dL   RDW 13.5 12.3 - 15.4 %   Platelets 236 150 - 379  x10E3/uL   Neutrophils 64 %   Lymphs 25 %   Monocytes 9 %   Eos 1 %   Basos 1 %   Neutrophils Absolute 4.3 1.4 - 7.0 x10E3/uL   Lymphocytes Absolute 1.7 0.7 - 3.1 x10E3/uL   Monocytes Absolute 0.6 0.1 - 0.9 x10E3/uL   EOS (ABSOLUTE) 0.1 0.0 - 0.4 x10E3/uL   Basophils Absolute 0.1 0.0 - 0.2 x10E3/uL   Immature Granulocytes 0 %   Immature Grans (Abs) 0.0 0.0 - 0.1 x10E3/uL  PSA  Result Value Ref Range   Prostate Specific Ag, Serum 1.7 0.0 - 4.0 ng/mL  Urinalysis, Routine w reflex microscopic (not at [facility])  Result Value Ref Range   Specific Gravity, UA 1.020 1.005 - 1.030   pH, UA 7.0 5.0 - 7.5   Color, UA Yellow Yellow   Appearance Ur Clear Clear   Leukocytes, UA Trace (A) Negative   Protein, UA Negative Negative/Trace   Glucose, UA Negative Negative   Ketones, UA 1+ (A) Negative   RBC, UA Negative Negative   Bilirubin, UA Negative Negative   Urobilinogen, Ur 1.0 0.2 - 1.0 mg/dL   Nitrite, UA Negative Negative   Microscopic Examination See below:   TSH  Result Value Ref Range   TSH 1.500 0.450 - 4.500 uIU/mL      Assessment & Plan:   Problem List Items Addressed This Visit      Cardiovascular and Mediastinum   Benign hypertension (Chronic)    Discuss hypertension care and treatment decrease beer consumption decrease salt weight loss if not better will need to start medications        Other   Generalized anxiety disorder (Chronic)    Anxiety depression will continue clonazepam taper and stop Zoloft discuss ways to do that if nerves still bothersome may need to add other medications will be sensitive for sexual dysfunction.      Relevant Medications   clonazePAM (KLONOPIN) 0.5 MG tablet    Other Visit Diagnoses   None.      Follow up plan: Return in about 6 months (around 06/05/2016) for Physical Exam.

## 2015-12-04 NOTE — Telephone Encounter (Signed)
Pt has decided he probably should try going on blood pressure meds.  Please call pt.

## 2015-12-04 NOTE — Assessment & Plan Note (Signed)
Anxiety depression will continue clonazepam taper and stop Zoloft discuss ways to do that if nerves still bothersome may need to add other medications will be sensitive for sexual dysfunction.

## 2015-12-04 NOTE — Assessment & Plan Note (Signed)
Discuss hypertension care and treatment decrease beer consumption decrease salt weight loss if not better will need to start medications

## 2015-12-04 NOTE — Progress Notes (Signed)
Phone call Discussed with patient decided after further reflection to go ahead and start blood pressure medicines will start Benzapril

## 2016-02-19 ENCOUNTER — Telehealth: Payer: Self-pay | Admitting: Family Medicine

## 2016-02-19 ENCOUNTER — Telehealth: Payer: BLUE CROSS/BLUE SHIELD | Admitting: Family

## 2016-02-19 DIAGNOSIS — R059 Cough, unspecified: Secondary | ICD-10-CM

## 2016-02-19 DIAGNOSIS — R05 Cough: Secondary | ICD-10-CM

## 2016-02-19 MED ORDER — BENZONATATE 100 MG PO CAPS: 100.0000 mg | ORAL_CAPSULE | Freq: Three times a day (TID) | ORAL | 0 refills | Status: DC | PRN

## 2016-02-19 MED ORDER — OSELTAMIVIR PHOSPHATE 75 MG PO CAPS: 75.0000 mg | ORAL_CAPSULE | Freq: Two times a day (BID) | ORAL | 1 refills | Status: DC

## 2016-02-19 NOTE — Telephone Encounter (Signed)
Pt called and stated that he thinks he has the flu. He's having chills, he's achy and he also has a fever. He would like to have something sent to [facility].

## 2016-02-19 NOTE — Telephone Encounter (Signed)
Routing to provider for advice.

## 2016-02-19 NOTE — Telephone Encounter (Signed)
Routing to provider  

## 2016-02-19 NOTE — Telephone Encounter (Signed)
Phone call patient with aching all over no real or known exposure to ticks having fever and headache and aching. Just started yesterday we'll call in Tamiflu

## 2016-02-19 NOTE — Telephone Encounter (Signed)
Patient states that he had an eVisit and that the provider only called in Tessalon Perles 100 MG. Patient states that he feels awful.  States that he has fever,chills and an excruciating headache. Patient thinks he has the flu and wants someone to call him to discuss next steps.

## 2016-02-19 NOTE — Progress Notes (Signed)
We are sorry that you are not feeling well.  Here is how we plan to help!  Based on what you have shared with me it looks like you have upper respiratory tract inflammation that has resulted in a significant cough.  Inflammation and infection in the upper respiratory tract is commonly called bronchitis and has four common causes:  Allergies, Viral Infections, Acid Reflux and Bacterial Infections.  Allergies, viruses and acid reflux are treated by controlling symptoms or eliminating the cause. An example might be a cough caused by taking certain blood pressure medications. You stop the cough by changing the medication. Another example might be a cough caused by acid reflux. Controlling the reflux helps control the cough.  Based on your presentation I believe you most likely have A cough due to a virus.  This is called viral bronchitis and is best treated by rest, plenty of fluids and control of the cough.  You may use Ibuprofen or Tylenol as directed to help your symptoms.    In addition you may use A non-prescription cough medication called Mucinex DM: take 2 tablets every 12 hours. and A prescription cough medication called Tessalon Perles 100mg. You may take 1-2 capsules every 8 hours as needed for your cough.    HOME CARE . Only take medications as instructed by your medical team. . Complete the entire course of an antibiotic. . Drink plenty of fluids and get plenty of rest. . Avoid close contacts especially the very young and the elderly . Cover your mouth if you cough or cough into your sleeve. . Always remember to wash your hands . A steam or ultrasonic humidifier can help congestion.    GET HELP RIGHT AWAY IF: . You develop worsening fever. . You become short of breath . You cough up blood. . Your symptoms persist after you have completed your treatment plan MAKE SURE YOU   Understand these instructions.  Will watch your condition.  Will get help right away if you are not doing  well or get worse.  Your e-visit answers were reviewed by a board certified advanced clinical practitioner to complete your personal care plan.  Depending on the condition, your plan could have included both over the counter or prescription medications. If there is a problem please reply  once you have received a response from your provider. Your safety is important to us.  If you have drug allergies check your prescription carefully.    You can use MyChart to ask questions about today's visit, request a non-urgent call back, or ask for a work or school excuse for 24 hours related to this e-Visit. If it has been greater than 24 hours you will need to follow up with your provider, or enter a new e-Visit to address those concerns. You will get an e-mail in the next two days asking about your experience.  I hope that your e-visit has been valuable and will speed your recovery. Thank you for using e-visits.   

## 2016-05-14 ENCOUNTER — Other Ambulatory Visit: Payer: Self-pay | Admitting: Family Medicine

## 2016-05-14 DIAGNOSIS — F411 Generalized anxiety disorder: Secondary | ICD-10-CM

## 2016-05-14 NOTE — Telephone Encounter (Signed)
Patient called to see if Dr Jeananne Rama would send his

## 2016-05-14 NOTE — Telephone Encounter (Signed)
Routing to provider. Appt 06/05/16.

## 2016-05-17 ENCOUNTER — Telehealth: Payer: Self-pay | Admitting: Family Medicine

## 2016-05-17 NOTE — Telephone Encounter (Signed)
Pt called stated he needs a refill on Clonazepam. Pharm is [facility]. Thanks.

## 2016-05-17 NOTE — Telephone Encounter (Signed)
RX was refilled on 05/14/16. Refill was called in to [facility].

## 2016-06-04 ENCOUNTER — Other Ambulatory Visit: Payer: Self-pay | Admitting: Family Medicine

## 2016-06-04 ENCOUNTER — Telehealth: Payer: Self-pay | Admitting: Family Medicine

## 2016-06-04 NOTE — Telephone Encounter (Signed)
Phone call Discussed with patient elevated blood pressure will stop alcohol taking extra Benzapril now will take 2 in the morning and patient has appointment at 9 AM we'll see him then.

## 2016-06-04 NOTE — Telephone Encounter (Signed)
Routing to provider  

## 2016-06-04 NOTE — Telephone Encounter (Signed)
Call pt 

## 2016-06-05 ENCOUNTER — Other Ambulatory Visit: Payer: Self-pay

## 2016-06-05 ENCOUNTER — Ambulatory Visit (INDEPENDENT_AMBULATORY_CARE_PROVIDER_SITE_OTHER): Payer: BLUE CROSS/BLUE SHIELD | Admitting: Family Medicine

## 2016-06-05 ENCOUNTER — Encounter: Payer: Self-pay | Admitting: Family Medicine

## 2016-06-05 ENCOUNTER — Telehealth: Payer: Self-pay

## 2016-06-05 VITALS — BP 121/84 | HR 93 | Ht 73.62 in | Wt 256.0 lb

## 2016-06-05 DIAGNOSIS — Z114 Encounter for screening for human immunodeficiency virus [HIV]: Secondary | ICD-10-CM | POA: Diagnosis not present

## 2016-06-05 DIAGNOSIS — Z125 Encounter for screening for malignant neoplasm of prostate: Secondary | ICD-10-CM

## 2016-06-05 DIAGNOSIS — Z13220 Encounter for screening for lipoid disorders: Secondary | ICD-10-CM | POA: Diagnosis not present

## 2016-06-05 DIAGNOSIS — I10 Essential (primary) hypertension: Secondary | ICD-10-CM

## 2016-06-05 DIAGNOSIS — Z0000 Encounter for general adult medical examination without abnormal findings: Secondary | ICD-10-CM

## 2016-06-05 DIAGNOSIS — Z1211 Encounter for screening for malignant neoplasm of colon: Secondary | ICD-10-CM

## 2016-06-05 DIAGNOSIS — Z23 Encounter for immunization: Secondary | ICD-10-CM | POA: Diagnosis not present

## 2016-06-05 DIAGNOSIS — F411 Generalized anxiety disorder: Secondary | ICD-10-CM

## 2016-06-05 DIAGNOSIS — Z1329 Encounter for screening for other suspected endocrine disorder: Secondary | ICD-10-CM

## 2016-06-05 LAB — URINALYSIS, ROUTINE W REFLEX MICROSCOPIC
Bilirubin, UA: NEGATIVE
Glucose, UA: NEGATIVE
Ketones, UA: NEGATIVE
LEUKOCYTES UA: NEGATIVE
NITRITE UA: NEGATIVE
PH UA: 5.5 (ref 5.0–7.5)
PROTEIN UA: NEGATIVE
RBC, UA: NEGATIVE
Specific Gravity, UA: 1.02 (ref 1.005–1.030)
Urobilinogen, Ur: 0.2 mg/dL (ref 0.2–1.0)

## 2016-06-05 MED ORDER — CLONAZEPAM 0.5 MG PO TABS: 0.5000 mg | ORAL_TABLET | Freq: Three times a day (TID) | ORAL | 5 refills | Status: DC | PRN

## 2016-06-05 MED ORDER — BENAZEPRIL HCL 40 MG PO TABS: 40.0000 mg | ORAL_TABLET | Freq: Every day | ORAL | 7 refills | Status: DC

## 2016-06-05 NOTE — Progress Notes (Signed)
BP 121/84   Pulse 93   Ht 6' 1.62" (1.87 m)   Wt 256 lb (116.1 kg)   SpO2 94%   BMI 33.21 kg/m    Subjective:    Patient ID: Harold Tate, male    DOB: 02-Aug-1965, 51 y.o.   MRN: [id number]  HPI: Harold Tate is a 51 y.o. male  Chief Complaint  Patient presents with  . Follow-up  . Annual Exam  Patient with multiple concerns. Blood pressure patient's had markedly elevated blood pressure off medication had stopped Benzapril because of concern about possible side effects has taken yesterday and today blood pressure doing well today. On 40 mg.  Anxiety has taken clonazepam 0.5  3 times a day which helps with both panic and chronic anxiety. Patient uses has been consistent.  Alcohol patient has had occasional spells of 10-12 beers in the evening. Had some sleep disorder. Staying up late primarily with electronic screens.  Relevant past medical, surgical, family and social history reviewed and updated as indicated. Interim medical history since our last visit reviewed. Allergies and medications reviewed and updated.  Review of Systems  Constitutional: Negative.   HENT: Negative.   Eyes: Negative.   Respiratory: Negative.   Cardiovascular: Negative.   Gastrointestinal: Negative.   Endocrine: Negative.   Genitourinary: Negative.   Musculoskeletal: Negative.   Skin: Negative.   Allergic/Immunologic: Negative.   Neurological: Negative.   Hematological: Negative.   Psychiatric/Behavioral: Negative.     Per HPI unless specifically indicated above     Objective:    BP 121/84   Pulse 93   Ht 6' 1.62" (1.87 m)   Wt 256 lb (116.1 kg)   SpO2 94%   BMI 33.21 kg/m   Wt Readings from Last 3 Encounters:  06/05/16 256 lb (116.1 kg)  12/04/15 229 lb (103.9 kg)  11/02/15 225 lb (102.1 kg)    Physical Exam  Constitutional: He is oriented to person, place, and time. He appears well-developed and well-nourished.  HENT:  Head: Normocephalic and atraumatic.  Right Ear:  External ear normal.  Left Ear: External ear normal.  Eyes: Conjunctivae and EOM are normal. Pupils are equal, round, and reactive to light.  Neck: Normal range of motion. Neck supple.  Cardiovascular: Normal rate, regular rhythm, normal heart sounds and intact distal pulses.   Pulmonary/Chest: Effort normal and breath sounds normal.  Abdominal: Soft. Bowel sounds are normal. There is no splenomegaly or hepatomegaly.  Genitourinary: Rectum normal, prostate normal and penis normal.  Musculoskeletal: Normal range of motion.  Neurological: He is alert and oriented to person, place, and time. He has normal reflexes.  Skin: No rash noted. No erythema.  Psychiatric: He has a normal mood and affect. His behavior is normal. Judgment and thought content normal.    Results for orders placed or performed in visit on 04/04/15  Microscopic Examination  Result Value Ref Range   WBC, UA 6-10 (A) 0 - 5 /hpf   RBC, UA 0-2 0 - 2 /hpf   Epithelial Cells (non renal) 0-10 0 - 10 /hpf   Bacteria, UA Few None seen/Few  Comprehensive metabolic panel  Result Value Ref Range   Glucose 105 (H) 65 - 99 mg/dL   BUN 10 6 - 24 mg/dL   Creatinine, Ser 0.82 0.76 - 1.27 mg/dL   GFR calc non Af Amer 104 >59 mL/min/1.73   GFR calc Af Amer 120 >59 mL/min/1.73   BUN/Creatinine Ratio 12 9 - 20  Sodium 139 136 - 144 mmol/L   Potassium 4.6 3.5 - 5.2 mmol/L   Chloride 97 97 - 106 mmol/L   CO2 26 18 - 29 mmol/L   Calcium 9.7 8.7 - 10.2 mg/dL   Total Protein 7.2 6.0 - 8.5 g/dL   Albumin 4.5 3.5 - 5.5 g/dL   Globulin, Total 2.7 1.5 - 4.5 g/dL   Albumin/Globulin Ratio 1.7 1.1 - 2.5   Bilirubin Total 0.7 0.0 - 1.2 mg/dL   Alkaline Phosphatase 84 39 - 117 IU/L   AST 25 0 - 40 IU/L   ALT 27 0 - 44 IU/L  Lipid panel  Result Value Ref Range   Cholesterol, Total 247 (H) 100 - 199 mg/dL   Triglycerides 93 0 - 149 mg/dL   HDL 68 >39 mg/dL   VLDL Cholesterol Cal 19 5 - 40 mg/dL   LDL Calculated 160 (H) 0 - 99 mg/dL    Chol/HDL Ratio 3.6 0.0 - 5.0 ratio units  CBC with Differential/Platelet  Result Value Ref Range   WBC 6.7 3.4 - 10.8 x10E3/uL   RBC 4.55 4.14 - 5.80 x10E6/uL   Hemoglobin 15.6 12.6 - 17.7 g/dL   Hematocrit 45.0 37.5 - 51.0 %   MCV 99 (H) 79 - 97 fL   MCH 34.3 (H) 26.6 - 33.0 pg   MCHC 34.7 31.5 - 35.7 g/dL   RDW 13.5 12.3 - 15.4 %   Platelets 236 150 - 379 x10E3/uL   Neutrophils 64 %   Lymphs 25 %   Monocytes 9 %   Eos 1 %   Basos 1 %   Neutrophils Absolute 4.3 1.4 - 7.0 x10E3/uL   Lymphocytes Absolute 1.7 0.7 - 3.1 x10E3/uL   Monocytes Absolute 0.6 0.1 - 0.9 x10E3/uL   EOS (ABSOLUTE) 0.1 0.0 - 0.4 x10E3/uL   Basophils Absolute 0.1 0.0 - 0.2 x10E3/uL   Immature Granulocytes 0 %   Immature Grans (Abs) 0.0 0.0 - 0.1 x10E3/uL  PSA  Result Value Ref Range   Prostate Specific Ag, Serum 1.7 0.0 - 4.0 ng/mL  Urinalysis, Routine w reflex microscopic (not at [facility])  Result Value Ref Range   Specific Gravity, UA 1.020 1.005 - 1.030   pH, UA 7.0 5.0 - 7.5   Color, UA Yellow Yellow   Appearance Ur Clear Clear   Leukocytes, UA Trace (A) Negative   Protein, UA Negative Negative/Trace   Glucose, UA Negative Negative   Ketones, UA 1+ (A) Negative   RBC, UA Negative Negative   Bilirubin, UA Negative Negative   Urobilinogen, Ur 1.0 0.2 - 1.0 mg/dL   Nitrite, UA Negative Negative   Microscopic Examination See below:   TSH  Result Value Ref Range   TSH 1.500 0.450 - 4.500 uIU/mL      Assessment & Plan:   Problem List Items Addressed This Visit      Cardiovascular and Mediastinum   Benign hypertension (Chronic)    Hypertension exacerbated by amount of alcohol being drunk lifestyle with anxiety and not taking medications. Discussed risk factor modification with lifestyle. Discussed taking Benzapril faithfully 40 mg 1 a day. Will recheck blood pressure 1 month      Relevant Medications   benazepril (LOTENSIN) 40 MG tablet   Other Relevant Orders   CBC with Differential/Platelet    Comprehensive metabolic panel   Urinalysis, Routine w reflex microscopic     Other   Generalized anxiety disorder (Chronic)    Discuss anxiety care and treatment with clonazepam  taking 3 a day patient not overusing prescriptions lasting appropriately will continue clonazepam.      Relevant Medications   clonazePAM (KLONOPIN) 0.5 MG tablet    Other Visit Diagnoses    Annual physical exam    -  Primary   Relevant Orders   CBC with Differential/Platelet   Comprehensive metabolic panel   Lipid panel   PSA   TSH   Urinalysis, Routine w reflex microscopic   HIV antibody   Encounter for screening for HIV       Relevant Orders   HIV antibody   Screening cholesterol level       Relevant Orders   Lipid panel   Prostate cancer screening       Relevant Orders   PSA   Thyroid disorder screen       Relevant Orders   TSH   Colon cancer screening       Relevant Orders   Ambulatory referral to Gastroenterology   Need for influenza vaccination           Follow up plan: Return in about 4 weeks (around 07/03/2016) for BMP.

## 2016-06-05 NOTE — Assessment & Plan Note (Signed)
Discuss anxiety care and treatment with clonazepam taking 3 a day patient not overusing prescriptions lasting appropriately will continue clonazepam.

## 2016-06-05 NOTE — Assessment & Plan Note (Signed)
Hypertension exacerbated by amount of alcohol being drunk lifestyle with anxiety and not taking medications. Discussed risk factor modification with lifestyle. Discussed taking Benzapril faithfully 40 mg 1 a day. Will recheck blood pressure 1 month

## 2016-06-05 NOTE — Telephone Encounter (Signed)
Gastroenterology Pre-Procedure Review  Request Date:  Requesting Physician: Dr.   PATIENT REVIEW QUESTIONS: The patient responded to the following health history questions as indicated:    1. Are you having any GI issues? no 2. Do you have a personal history of Polyps? no 3. Do you have a family history of Colon Cancer or Polyps? yes (father polyps) 4. Diabetes Mellitus? no 5. Joint replacements in the past 12 months?no 6. Major health problems in the past 3 months?no 7. Any artificial heart valves, MVP, or defibrillator?no    MEDICATIONS & ALLERGIES:    Patient reports the following regarding taking any anticoagulation/antiplatelet therapy:   Plavix, Coumadin, Eliquis, Xarelto, Lovenox, Pradaxa, Brilinta, or Effient? no Aspirin? no  Patient confirms/reports the following medications:  Current Outpatient Prescriptions  Medication Sig Dispense Refill  . benazepril (LOTENSIN) 40 MG tablet Take 1 tablet (40 mg total) by mouth daily. 30 tablet 7  . clindamycin (CLEOCIN) 150 MG capsule Take by mouth daily.     . clonazePAM (KLONOPIN) 0.5 MG tablet Take 1 tablet (0.5 mg total) by mouth 3 (three) times daily as needed for anxiety. 90 tablet 5   No current facility-administered medications for this visit.     Patient confirms/reports the following allergies:  No Known Allergies  No orders of the defined types were placed in this encounter.   AUTHORIZATION INFORMATION Primary Insurance: 1D#: Group #:  Secondary Insurance: 1D#: Group #:  SCHEDULE INFORMATION: Date: 07/02/16 Time: Location: [place]

## 2016-06-06 ENCOUNTER — Telehealth: Payer: Self-pay | Admitting: Family Medicine

## 2016-06-06 ENCOUNTER — Encounter: Payer: Self-pay | Admitting: Family Medicine

## 2016-06-06 DIAGNOSIS — E1169 Type 2 diabetes mellitus with other specified complication: Secondary | ICD-10-CM | POA: Insufficient documentation

## 2016-06-06 DIAGNOSIS — R748 Abnormal levels of other serum enzymes: Secondary | ICD-10-CM

## 2016-06-06 DIAGNOSIS — E7800 Pure hypercholesterolemia, unspecified: Secondary | ICD-10-CM

## 2016-06-06 LAB — COMPREHENSIVE METABOLIC PANEL
A/G RATIO: 1.5 (ref 1.2–2.2)
ALBUMIN: 4.1 g/dL (ref 3.5–5.5)
ALK PHOS: 126 IU/L — AB (ref 39–117)
ALT: 56 IU/L — AB (ref 0–44)
AST: 32 IU/L (ref 0–40)
BILIRUBIN TOTAL: 0.7 mg/dL (ref 0.0–1.2)
BUN / CREAT RATIO: 14 (ref 9–20)
BUN: 11 mg/dL (ref 6–24)
CHLORIDE: 97 mmol/L (ref 96–106)
CO2: 24 mmol/L (ref 18–29)
Calcium: 9.2 mg/dL (ref 8.7–10.2)
Creatinine, Ser: 0.78 mg/dL (ref 0.76–1.27)
GFR calc non Af Amer: 105 mL/min/{1.73_m2} (ref >59)
GFR, EST AFRICAN AMERICAN: 122 mL/min/{1.73_m2} (ref >59)
GLUCOSE: 142 mg/dL — AB (ref 65–99)
Globulin, Total: 2.8 g/dL (ref 1.5–4.5)
POTASSIUM: 4.3 mmol/L (ref 3.5–5.2)
Sodium: 138 mmol/L (ref 134–144)
TOTAL PROTEIN: 6.9 g/dL (ref 6.0–8.5)

## 2016-06-06 LAB — CBC WITH DIFFERENTIAL/PLATELET
BASOS ABS: 0.1 10*3/uL (ref 0.0–0.2)
BASOS: 1 %
EOS (ABSOLUTE): 0.2 10*3/uL (ref 0.0–0.4)
Eos: 4 %
Hematocrit: 48.8 % (ref 37.5–51.0)
Hemoglobin: 16.5 g/dL (ref 13.0–17.7)
IMMATURE GRANS (ABS): 0 10*3/uL (ref 0.0–0.1)
Immature Granulocytes: 0 %
LYMPHS ABS: 2 10*3/uL (ref 0.7–3.1)
LYMPHS: 33 %
MCH: 35.5 pg — AB (ref 26.6–33.0)
MCHC: 33.8 g/dL (ref 31.5–35.7)
MCV: 105 fL — AB (ref 79–97)
Monocytes Absolute: 0.7 10*3/uL (ref 0.1–0.9)
Monocytes: 11 %
NEUTROS ABS: 3.2 10*3/uL (ref 1.4–7.0)
Neutrophils: 51 %
PLATELETS: 192 10*3/uL (ref 150–379)
RBC: 4.65 x10E6/uL (ref 4.14–5.80)
RDW: 13.5 % (ref 12.3–15.4)
WBC: 6.2 10*3/uL (ref 3.4–10.8)

## 2016-06-06 LAB — LIPID PANEL
Chol/HDL Ratio: 5.6 ratio units — ABNORMAL HIGH (ref 0.0–5.0)
Cholesterol, Total: 269 mg/dL — ABNORMAL HIGH (ref 100–199)
HDL: 48 mg/dL (ref >39)
LDL Calculated: 169 mg/dL — ABNORMAL HIGH (ref 0–99)
Triglycerides: 260 mg/dL — ABNORMAL HIGH (ref 0–149)
VLDL Cholesterol Cal: 52 mg/dL — ABNORMAL HIGH (ref 5–40)

## 2016-06-06 LAB — TSH: TSH: 3.97 u[IU]/mL (ref 0.450–4.500)

## 2016-06-06 LAB — PSA: PROSTATE SPECIFIC AG, SERUM: 0.5 ng/mL (ref 0.0–4.0)

## 2016-06-06 NOTE — Telephone Encounter (Signed)
d 

## 2016-06-06 NOTE — Assessment & Plan Note (Signed)
Phone call with patient today about diet better nutrition decreased alcohol

## 2016-06-06 NOTE — Telephone Encounter (Signed)
Phone call Discussed with patient elevated glucose patient was fasting will check hemoglobin A1c Discuss elevated liver enzymes which is no surprise with the amount of alcohol patient's been drinking patient will dramatically cut back as discussed yesterday and reassess at next office visit in 3 months.

## 2016-06-08 LAB — SPECIMEN STATUS REPORT

## 2016-06-08 LAB — HGB A1C W/O EAG: Hgb A1c MFr Bld: 5.9 % — ABNORMAL HIGH (ref 4.8–5.6)

## 2016-07-01 ENCOUNTER — Telehealth: Payer: Self-pay | Admitting: Gastroenterology

## 2016-07-01 NOTE — Telephone Encounter (Signed)
Patient called to cancel his colonoscopy due to the flu. I called [facility] to let them know. Patient will call back to reschedule when he feels better.

## 2016-07-02 ENCOUNTER — Ambulatory Visit
Admission: RE | Admit: 2016-07-02 | Payer: BLUE CROSS/BLUE SHIELD | Source: Ambulatory Visit | Admitting: Gastroenterology

## 2016-07-02 ENCOUNTER — Encounter: Admission: RE | Payer: Self-pay | Source: Ambulatory Visit

## 2016-07-02 SURGERY — COLONOSCOPY WITH PROPOFOL
Anesthesia: General

## 2016-07-17 ENCOUNTER — Ambulatory Visit: Payer: BLUE CROSS/BLUE SHIELD | Admitting: Family Medicine

## 2016-07-25 ENCOUNTER — Telehealth: Payer: Self-pay

## 2016-07-25 ENCOUNTER — Encounter: Payer: Self-pay | Admitting: Emergency Medicine

## 2016-07-25 ENCOUNTER — Emergency Department
Admission: EM | Admit: 2016-07-25 | Discharge: 2016-07-25 | Disposition: A | Discharge status: Home / Self Care | Payer: BLUE CROSS/BLUE SHIELD | Attending: Emergency Medicine | Admitting: Emergency Medicine

## 2016-07-25 ENCOUNTER — Emergency Department: Payer: BLUE CROSS/BLUE SHIELD

## 2016-07-25 DIAGNOSIS — M79605 Pain in left leg: Secondary | ICD-10-CM

## 2016-07-25 DIAGNOSIS — M7989 Other specified soft tissue disorders: Secondary | ICD-10-CM | POA: Insufficient documentation

## 2016-07-25 DIAGNOSIS — F17210 Nicotine dependence, cigarettes, uncomplicated: Secondary | ICD-10-CM | POA: Diagnosis not present

## 2016-07-25 DIAGNOSIS — M79604 Pain in right leg: Secondary | ICD-10-CM

## 2016-07-25 LAB — COMPREHENSIVE METABOLIC PANEL
ALT: 62 U/L (ref 17–63)
ANION GAP: 8 (ref 5–15)
AST: 47 U/L — ABNORMAL HIGH (ref 15–41)
Albumin: 4 g/dL (ref 3.5–5.0)
Alkaline Phosphatase: 108 U/L (ref 38–126)
BUN: 10 mg/dL (ref 6–20)
CHLORIDE: 102 mmol/L (ref 101–111)
CO2: 24 mmol/L (ref 22–32)
Calcium: 9.6 mg/dL (ref 8.9–10.3)
Creatinine, Ser: 0.89 mg/dL (ref 0.61–1.24)
GFR calc non Af Amer: >60 mL/min (ref >60)
Glucose, Bld: 104 mg/dL — ABNORMAL HIGH (ref 65–99)
Potassium: 4 mmol/L (ref 3.5–5.1)
Sodium: 134 mmol/L — ABNORMAL LOW (ref 135–145)
Total Bilirubin: 1 mg/dL (ref 0.3–1.2)
Total Protein: 7.3 g/dL (ref 6.5–8.1)

## 2016-07-25 LAB — CBC
HCT: 47.8 % (ref 40.0–52.0)
Hemoglobin: 16.8 g/dL (ref 13.0–18.0)
MCH: 36.5 pg — AB (ref 26.0–34.0)
MCHC: 35.1 g/dL (ref 32.0–36.0)
MCV: 104.1 fL — ABNORMAL HIGH (ref 80.0–100.0)
PLATELETS: 195 10*3/uL (ref 150–440)
RBC: 4.6 MIL/uL (ref 4.40–5.90)
RDW: 13.8 % (ref 11.5–14.5)
WBC: 8.6 10*3/uL (ref 3.8–10.6)

## 2016-07-25 LAB — FIBRIN DERIVATIVES D-DIMER (ARMC ONLY): Fibrin derivatives D-dimer (ARMC): 472.86 (ref 0.00–499.00)

## 2016-07-25 LAB — SEDIMENTATION RATE: Sed Rate: 3 mm/hr (ref 0–20)

## 2016-07-25 NOTE — Discharge Instructions (Signed)
Please return immediately if condition worsens. Please contact her primary physician or the physician you were given for referral. If you have any specialist physicians involved in her treatment and plan please also contact them. Thank you for using [facility].   Please return especially for fever, increased pain, swelling or weakness. These contact your physicians and make them aware of the results here.

## 2016-07-25 NOTE — ED Provider Notes (Signed)
Time Seen: Approximately 1811  I have reviewed the triage notes  Chief Complaint: Claudication   History of Present Illness: Harold Tate is a 51 y.o. male  who is referred here by [facility] orthopedics and in for further evaluation of a possible deep venous thrombosis. Patient's had pain primarily over the anterior surface of his lower extremity. Patient's had previous fracture with surgical repair. He was seen in her office started today and it was concern for possible "" staph infection"". Also concern for possible deep venous thrombosis and the patient has had a blood clot in that leg prior. He has history of tobacco abuse and has a history of Raynaud's phenomenon Patient states he had recent x-rays of the area which showed no acute fracture or any significant bony abnormalities  Past Medical History:  Diagnosis Date  . Anxiety   . Raynaud's syndrome     Patient Active Problem List   Diagnosis Date Noted  . Hypercholesterolemia 06/06/2016  . Raynaud's syndrome 09/30/2014  . Generalized anxiety disorder 09/30/2014  . Tobacco dependence syndrome 09/30/2014  . Benign hypertension 09/30/2014  . Deep vein thrombosis of left lower limb ([place]) 09/30/2014  . Open fracture of shaft of right tibia, type IIIA, IIIB, or IIIC 09/30/2014    Past Surgical History:  Procedure Laterality Date  . OPEN REDUCTION INTERNAL FIXATION (ORIF) TIBIA/FIBULA FRACTURE Left July 2014    Past Surgical History:  Procedure Laterality Date  . OPEN REDUCTION INTERNAL FIXATION (ORIF) TIBIA/FIBULA FRACTURE Left July 2014    Current Outpatient Rx  . Order #: [id number] Class: Normal  . Order #: [id number] Class: Historical Med  . Order #: [id number] Class: Print    Allergies:  Patient has no known allergies.  Family History: No family history on file.  Social History: Social History  Substance Use Topics  . Smoking status: Current Every Day Smoker    Packs/day: 1.00    Years: 27.00    Types:  Cigarettes  . Smokeless tobacco: Never Used  . Alcohol use 21.0 oz/week    35 Cans of beer per week     Review of Systems:   10 point review of systems was performed and was otherwise negative:  Constitutional: No fever Eyes: No visual disturbances ENT: No sore throat, ear pain Cardiac: No chest pain Respiratory: No shortness of breath, wheezing, or stridor Abdomen: No abdominal pain, no vomiting, No diarrhea Endocrine: No weight loss, No night sweats Extremities: Mild chronic swelling in his left lower extremity. He denies any calf tenderness or swelling proximal to the knee Skin: No rashes, easy bruising Neurologic: No focal weakness, trouble with speech or swollowing Urologic: No dysuria, Hematuria, or urinary frequency   Physical Exam:  ED Triage Vitals  Enc Vitals Group     BP 07/25/16 1621 118/75     Pulse Rate 07/25/16 1621 (!) 111     Resp 07/25/16 1621 20     Temp 07/25/16 1621 98.2 F (36.8 C)     Temp Source 07/25/16 1621 Oral     SpO2 07/25/16 1621 97 %     Weight 07/25/16 1622 255 lb 3.2 oz (115.8 kg)     Height 07/25/16 1622 _0  (1.854 m)     Head Circumference --      Peak Flow --      Pain Score 07/25/16 1626 4     Pain Loc --      Pain Edu? --      Excl. in [place]? --  General: Awake , Alert , and Oriented times 3; GCS 15 Head: Normal cephalic , atraumatic Eyes: Pupils equal , round, reactive to light Nose/Throat: No nasal drainage, patent upper airway without erythema or exudate.  Neck: Supple, Full range of motion, No anterior adenopathy or palpable thyroid masses Lungs: Clear to ascultation without wheezes , rhonchi, or rales Heart: Regular rate, regular rhythm without murmurs , gallops , or rubs Abdomen: Soft, non tender without rebound, guarding , or rigidity; bowel sounds positive and symmetric in all 4 quadrants. No organomegaly .        Extremities: 2Tender primarily over the anterior surface of his left ankle. There is no redness. Patient  is able to bear weight though uncomfortable.  Neurologic: normal ambulation, Motor symmetric without deficits, sensory intact Skin: warm, dry, no rashes   Labs:   All laboratory work was reviewed including any pertinent negatives or positives listed below:  Labs Reviewed  CBC - Abnormal; Notable for the following:       Result Value   MCV 104.1 (*)    MCH 36.5 (*)    All other components within normal limits  COMPREHENSIVE METABOLIC PANEL - Abnormal; Notable for the following:    Sodium 134 (*)    Glucose, Bld 104 (*)    AST 47 (*)    All other components within normal limits  FIBRIN DERIVATIVES D-DIMER [facility] ONLY)  SEDIMENTATION RATE    Radiology: "US Venous Img Lower Unilateral Left  Result Date: 07/25/2016 CLINICAL DATA:  LEFT leg pain and swelling for 3 days. Assess for deep vein thrombosis, possible Staph infection. History of DVT. EXAM: LEFT LOWER EXTREMITY VENOUS DOPPLER ULTRASOUND TECHNIQUE: Gray-scale sonography with graded compression, as well as color Doppler and duplex ultrasound were performed to evaluate the lower extremity deep venous systems from the level of the common femoral vein and including the common femoral, femoral, profunda femoral, popliteal and calf veins including the posterior tibial, peroneal and gastrocnemius veins when visible. The superficial great saphenous vein was also interrogated. Spectral Doppler was utilized to evaluate flow at rest and with distal augmentation maneuvers in the common femoral, femoral and popliteal veins. COMPARISON:  LEFT lower extremity duplex ultrasound January 28, 2014 FINDINGS: Contralateral Common Femoral Vein: Respiratory phasicity is normal and symmetric with the symptomatic side. No evidence of thrombus. Normal compressibility. Common Femoral Vein: No evidence of thrombus. Normal compressibility, respiratory phasicity and response to augmentation. Saphenofemoral Junction: No evidence of thrombus. Normal compressibility and  flow on color Doppler imaging. Profunda Femoral Vein: No evidence of thrombus. Normal compressibility and flow on color Doppler imaging. Femoral Vein: No evidence of thrombus. Normal compressibility, respiratory phasicity and response to augmentation. Popliteal Vein: No evidence of thrombus. Normal compressibility, respiratory phasicity and response to augmentation. Calf Veins: No evidence of thrombus.  Normal  color Doppler imaging. Venous Reflux:  None. Other Findings:  Lower extremity edema. IMPRESSION: No acute deep vein thrombosis LEFT lower extremity. Electronically Signed   By: Elon Alas M.D.   On: 07/25/2016 19:20  "  I personally reviewed the radiologic studies   ED Course: * At this time I'm not sure of the nature of the patient's pain and swelling. It could be reactive sympathetic dystrophy, acute aggravation of his previous fracture, etc. His ESR is normal and I felt with no fever and no elevated white blood cell count with recent negative x-rays to further investigation of osteomyelitis was not necessary. She'll follow up with infectious disease and he was advised  to keep that appointment. I felt there was no reason to admit the patient or given him IV antibiotics at this time. His Doppler study was negative for deep venous thrombosis.*    Final Clinical Impression:   Final diagnoses:  Pain of left lower extremity     Plan: Outpatient Patient was advised to return immediately if condition worsens. Patient was advised to follow up with their primary care physician or other specialized physicians involved in their outpatient care. The patient and/or family member/power of attorney had laboratory results reviewed at the bedside. All questions and concerns were addressed and appropriate discharge instructions were distributed by the nursing staff.             Daymon Larsen, MD 07/25/16 2051

## 2016-07-25 NOTE — Telephone Encounter (Signed)
I spoke back with patient, he is currently at [facility] getting it checked out. I informed him we placed a referral for Ortho for him, but that [facility] will probably arrange a referral too and he could decide which one he wanted to go to.

## 2016-07-25 NOTE — Telephone Encounter (Signed)
Yes, I think it would be a good idea to get checked out right away. I will place a new referral and get that going as well.

## 2016-07-25 NOTE — ED Triage Notes (Addendum)
Pt sent over from [facility] in [place] for further eval of possible blot clot in lower left leg and or staph infection. Pain with walking.

## 2016-07-25 NOTE — Telephone Encounter (Signed)
This is a patient of Dr.Crissmans.  In 2014 he broke his legs, had surgeries. Then his leg rebroke and during surgery they found he had a staph infection. They replaced all the pins, etc in his leg.  2 days ago, his leg got tender again like before when it had rebroke.   He would like a referral to Ortho. He doesn't want to go back to his old doctor in [place]. He also wants to know if it would be a good idea for him to go to an Ortho UC to have it checked out/xrayed today.

## 2016-08-12 DIAGNOSIS — M19179 Post-traumatic osteoarthritis, unspecified ankle and foot: Secondary | ICD-10-CM | POA: Insufficient documentation

## 2016-08-29 ENCOUNTER — Encounter: Payer: Self-pay | Admitting: Family Medicine

## 2016-08-29 ENCOUNTER — Ambulatory Visit (INDEPENDENT_AMBULATORY_CARE_PROVIDER_SITE_OTHER): Payer: BLUE CROSS/BLUE SHIELD | Admitting: Family Medicine

## 2016-08-29 VITALS — BP 151/100 | HR 100 | Temp 97.7°F | Wt 262.3 lb

## 2016-08-29 DIAGNOSIS — R5383 Other fatigue: Secondary | ICD-10-CM | POA: Diagnosis not present

## 2016-08-29 DIAGNOSIS — J0100 Acute maxillary sinusitis, unspecified: Secondary | ICD-10-CM

## 2016-08-29 MED ORDER — AZITHROMYCIN 250 MG PO TABS: ORAL_TABLET | ORAL | 0 refills | Status: DC

## 2016-08-29 MED ORDER — FLUTICASONE PROPIONATE 50 MCG/ACT NA SUSP
2.0000 | Freq: Every day | NASAL | 6 refills | Status: DC
Start: 2016-08-29 — End: 2016-11-05

## 2016-08-29 MED ORDER — GUAIFENESIN ER 600 MG PO TB12: 1200.0000 mg | ORAL_TABLET | Freq: Two times a day (BID) | ORAL | 0 refills | Status: DC | PRN

## 2016-08-29 MED ORDER — HYDROCOD POLST-CPM POLST ER 10-8 MG/5ML PO SUER: 5.0000 mL | Freq: Two times a day (BID) | ORAL | 0 refills | Status: DC | PRN

## 2016-08-29 NOTE — Patient Instructions (Addendum)
Coricidin HBP Robitussin or Delsym syrup for cough

## 2016-08-29 NOTE — Progress Notes (Signed)
BP (!) 151/100 (BP Location: Right Arm, Patient Position: Sitting, Cuff Size: Large)   Pulse 100   Temp 97.7 F (36.5 C)   Wt 262 lb 4.8 oz (119 kg)   SpO2 95%   BMI 34.61 kg/m    Subjective:    Patient ID: Harold Tate, male    DOB: May 20, 1965, 51 y.o.   MRN: [id number]  HPI: Harold Tate is a 51 y.o. male  Chief Complaint  Patient presents with  . URI    X 1 week   Patient presents with 1 week history of sore throat, congestion, productive cough, facial pain/pressure, sinus HA. Denies fever, chills, aches, CP, SOB. Tried a few aleve but hasn't tried much else OTC because he wasn't sure what he could take with his HTN. Denies sick contacts.   Also wanting to discuss his weight gain (reports 40-60 lb over the past year), fatigue, and low sex drive. States since his divorce 2 years ago he hasn't felt like himself. Has tried wellbutrin and zoloft with no relief, does not feel like it is a mood issue. Wanting his testosterone checked as a friend told him that was probably the issue. States he hasn't changed his eating habits or exercise habits. Recent labs reviewed, TSH and other basic labs benign.   Past Medical History:  Diagnosis Date  . Anxiety   . Raynaud's syndrome    Social History   Social History  . Marital status: Married    Spouse name: N/A  . Number of children: N/A  . Years of education: N/A   Occupational History  . Not on file.   Social History Main Topics  . Smoking status: Current Every Day Smoker    Packs/day: 1.00    Years: 27.00    Types: Cigarettes  . Smokeless tobacco: Never Used  . Alcohol use 21.0 oz/week    35 Cans of beer per week  . Drug use: Yes    Types: Benzodiazepines  . Sexual activity: Not on file   Other Topics Concern  . Not on file   Social History Narrative  . No narrative on file   Relevant past medical, surgical, family and social history reviewed and updated as indicated. Interim medical history since our last visit  reviewed. Allergies and medications reviewed and updated.  Review of Systems  Constitutional: Positive for fatigue and unexpected weight change.  HENT: Positive for congestion, sinus pain, sinus pressure and sore throat.   Eyes: Negative.   Respiratory: Positive for cough.   Cardiovascular: Negative.   Gastrointestinal: Negative.   Genitourinary: Negative.        Low libido  Musculoskeletal: Negative.   Neurological: Positive for headaches.  Psychiatric/Behavioral: Negative.    Per HPI unless specifically indicated above     Objective:    BP (!) 151/100 (BP Location: Right Arm, Patient Position: Sitting, Cuff Size: Large)   Pulse 100   Temp 97.7 F (36.5 C)   Wt 262 lb 4.8 oz (119 kg)   SpO2 95%   BMI 34.61 kg/m   Wt Readings from Last 3 Encounters:  08/29/16 262 lb 4.8 oz (119 kg)  07/25/16 255 lb 3.2 oz (115.8 kg)  06/05/16 256 lb (116.1 kg)    Physical Exam  Constitutional: He is oriented to person, place, and time. He appears well-developed and well-nourished. No distress.  HENT:  Head: Atraumatic.  Right Ear: External ear normal.  Left Ear: External ear normal.  Mouth/Throat: No oropharyngeal exudate.  Sinus TTP b/l Oropharynx erythematous Left TM injected  Eyes: Conjunctivae are normal. Pupils are equal, round, and reactive to light.  Neck: Normal range of motion. Neck supple.  Cardiovascular: Normal rate and normal heart sounds.   Pulmonary/Chest: Effort normal and breath sounds normal. No respiratory distress.  Musculoskeletal: Normal range of motion.  Neurological: He is alert and oriented to person, place, and time.  Skin: Skin is warm and dry.  Psychiatric: He has a normal mood and affect. His behavior is normal.  Nursing note and vitals reviewed.     Assessment & Plan:   Problem List Items Addressed This Visit    None    Visit Diagnoses    Fatigue, unspecified type    -  Primary   Will check testosterone level. Discussed importance of weight  loss - diet and regular exercise.    Relevant Orders   Testosterone (Completed)   Acute maxillary sinusitis, recurrence not specified       Mucinex, azithromycin, flonase, and tussionex sent. Discussed safe OTC options that won't elevate BP. F/u if worsening or no improvement   Relevant Medications   fluticasone (FLONASE) 50 MCG/ACT nasal spray   azithromycin (ZITHROMAX) 250 MG tablet   guaiFENesin (MUCINEX) 600 MG 12 hr tablet   chlorpheniramine-HYDROcodone (TUSSIONEX PENNKINETIC ER) 10-8 MG/5ML SUER       Follow up plan: Return in about 3 months (around 11/29/2016) for 6 month f/u.

## 2016-08-30 ENCOUNTER — Telehealth: Payer: Self-pay | Admitting: Family Medicine

## 2016-08-30 LAB — TESTOSTERONE: Testosterone: 422 ng/dL (ref 264–916)

## 2016-08-30 NOTE — Telephone Encounter (Signed)
Please call pt and let him know that his testosterone came back normal. Continue working on diet improvement, weight loss, exercise (low impact like swimming or stationary bike for his ankle), reduce any alcohol or sweets intake. He will be due for a f/u in August

## 2016-08-30 NOTE — Telephone Encounter (Signed)
Patient notified

## 2016-09-03 ENCOUNTER — Ambulatory Visit: Payer: BLUE CROSS/BLUE SHIELD | Admitting: Family Medicine

## 2016-10-23 ENCOUNTER — Ambulatory Visit (INDEPENDENT_AMBULATORY_CARE_PROVIDER_SITE_OTHER): Payer: BLUE CROSS/BLUE SHIELD | Admitting: Family Medicine

## 2016-10-23 ENCOUNTER — Encounter: Payer: Self-pay | Admitting: Family Medicine

## 2016-10-23 ENCOUNTER — Telehealth: Payer: Self-pay | Admitting: Family Medicine

## 2016-10-23 DIAGNOSIS — F411 Generalized anxiety disorder: Secondary | ICD-10-CM | POA: Diagnosis not present

## 2016-10-23 DIAGNOSIS — I10 Essential (primary) hypertension: Secondary | ICD-10-CM | POA: Diagnosis not present

## 2016-10-23 DIAGNOSIS — R5383 Other fatigue: Secondary | ICD-10-CM | POA: Insufficient documentation

## 2016-10-23 DIAGNOSIS — R531 Weakness: Secondary | ICD-10-CM | POA: Insufficient documentation

## 2016-10-23 MED ORDER — CLONAZEPAM 0.5 MG PO TABS: 0.5000 mg | ORAL_TABLET | Freq: Three times a day (TID) | ORAL | 5 refills | Status: DC | PRN

## 2016-10-23 NOTE — Telephone Encounter (Signed)
Please fax last labs to [facility] @ [phone]  Thanks

## 2016-10-23 NOTE — Assessment & Plan Note (Signed)
Discuss anxiety will continue current medication and current dosings cautions about running out early and taking extra.

## 2016-10-23 NOTE — Assessment & Plan Note (Signed)
The current medical regimen is effective;  continue present plan and medications.  

## 2016-10-23 NOTE — Progress Notes (Signed)
   BP 136/86 (BP Location: Left Arm)   Pulse (!) 101   Wt 264 lb (119.7 kg)   SpO2 93%   BMI 34.83 kg/m    Subjective:    Patient ID: Harold Tate, male    DOB: August 22, 1965, 51 y.o.   MRN: [id number]  HPI: Harold Tate is a 51 y.o. male  Chief Complaint  Patient presents with  . Depression   Patient with complaints of feeling bad sleepy tired a great deal has been depressed in the past but doesn't really feel depressed but is acting like he is depressed. Has no energy not sure about snoring or sleep apnea wife doesn't say anything. On review sometimes patient will come back from getting work started  Relevant past medical, surgical, family and social history reviewed and updated as indicated. Interim medical history since our last visit reviewed. Allergies and medications reviewed and updated.  Review of Systems  Constitutional: Negative.   Respiratory: Negative.   Cardiovascular: Negative.     Per HPI unless specifically indicated above     Objective:    BP 136/86 (BP Location: Left Arm)   Pulse (!) 101   Wt 264 lb (119.7 kg)   SpO2 93%   BMI 34.83 kg/m   Wt Readings from Last 3 Encounters:  10/23/16 264 lb (119.7 kg)  08/29/16 262 lb 4.8 oz (119 kg)  07/25/16 255 lb 3.2 oz (115.8 kg)    Physical Exam  Constitutional: He is oriented to person, place, and time. He appears well-developed and well-nourished.  HENT:  Head: Normocephalic and atraumatic.  Eyes: Conjunctivae and EOM are normal.  Neck: Normal range of motion.  Cardiovascular: Normal rate, regular rhythm and normal heart sounds.   Pulmonary/Chest: Effort normal and breath sounds normal.  Musculoskeletal: Normal range of motion.  Neurological: He is alert and oriented to person, place, and time.  Skin: No erythema.  Psychiatric: He has a normal mood and affect. His behavior is normal. Judgment and thought content normal.    Results for orders placed or performed in visit on 08/29/16  Testosterone    Result Value Ref Range   Testosterone 422 264 - 916 ng/dL      Assessment & Plan:   Problem List Items Addressed This Visit      Cardiovascular and Mediastinum   Benign hypertension (Chronic)    The current medical regimen is effective;  continue present plan and medications.         Other   Generalized anxiety disorder (Chronic)    Discuss anxiety will continue current medication and current dosings cautions about running out early and taking extra.      Relevant Medications   clonazePAM (KLONOPIN) 0.5 MG tablet   Fatigue    Nonspecific fatigue symptoms and patient with multiple risk factors for sleep apnea and symptoms of sleep apnea. We'll make referral for sleep study discussed with patient importance and risk and benefit.      Relevant Orders   Ambulatory referral to Sleep Studies       Follow up plan: Return in about 4 weeks (around 11/20/2016).

## 2016-10-23 NOTE — Assessment & Plan Note (Signed)
Nonspecific fatigue symptoms and patient with multiple risk factors for sleep apnea and symptoms of sleep apnea. We'll make referral for sleep study discussed with patient importance and risk and benefit.

## 2016-10-24 NOTE — Telephone Encounter (Signed)
Fax sent.

## 2016-11-04 ENCOUNTER — Telehealth: Payer: Self-pay | Admitting: Family Medicine

## 2016-11-04 NOTE — Telephone Encounter (Signed)
Call pt 

## 2016-11-04 NOTE — Telephone Encounter (Signed)
Patient was transferred to provider for telephone conversation.   

## 2016-11-04 NOTE — Telephone Encounter (Signed)
Patient has been feeling dizzy and having dull pain in his chest when he gets too hot outside. Patient states he has had 3 episodes like this so far. Patient states that he does not know if it is in correlation to his past. Patient would like to know from pcp if he should come in for an appointment with his pcp or be seen by a heart doctor. Patient states once he gets cooled off he feels better but that the pain and exhaustion comes and go's when he is hot.  Please Advise.  Thank you

## 2016-11-04 NOTE — Telephone Encounter (Signed)
Phone call Discussed with patient concerning symptoms either for heat exhaustion are possibly coronary artery disease as patient to be worked in tomorrow to further evaluate.

## 2016-11-04 NOTE — Telephone Encounter (Signed)
Please advise 

## 2016-11-05 ENCOUNTER — Ambulatory Visit (INDEPENDENT_AMBULATORY_CARE_PROVIDER_SITE_OTHER): Payer: BLUE CROSS/BLUE SHIELD | Admitting: Family Medicine

## 2016-11-05 ENCOUNTER — Encounter: Payer: Self-pay | Admitting: Family Medicine

## 2016-11-05 ENCOUNTER — Ambulatory Visit: Payer: BLUE CROSS/BLUE SHIELD | Admitting: Family Medicine

## 2016-11-05 VITALS — BP 126/84 | HR 111 | Wt 260.0 lb

## 2016-11-05 DIAGNOSIS — E119 Type 2 diabetes mellitus without complications: Secondary | ICD-10-CM | POA: Diagnosis not present

## 2016-11-05 DIAGNOSIS — E669 Obesity, unspecified: Secondary | ICD-10-CM | POA: Insufficient documentation

## 2016-11-05 DIAGNOSIS — R81 Glycosuria: Secondary | ICD-10-CM | POA: Diagnosis not present

## 2016-11-05 DIAGNOSIS — E1169 Type 2 diabetes mellitus with other specified complication: Secondary | ICD-10-CM | POA: Insufficient documentation

## 2016-11-05 DIAGNOSIS — R079 Chest pain, unspecified: Secondary | ICD-10-CM

## 2016-11-05 LAB — URINALYSIS, ROUTINE W REFLEX MICROSCOPIC
Bilirubin, UA: NEGATIVE
Leukocytes, UA: NEGATIVE
Nitrite, UA: NEGATIVE
PROTEIN UA: NEGATIVE
RBC, UA: NEGATIVE
Specific Gravity, UA: 1.02 (ref 1.005–1.030)
UUROB: 1 mg/dL (ref 0.2–1.0)
pH, UA: 5.5 (ref 5.0–7.5)

## 2016-11-05 LAB — CBC WITH DIFFERENTIAL/PLATELET
HEMATOCRIT: 45.1 % (ref 37.5–51.0)
Hemoglobin: 15.3 g/dL (ref 13.0–17.7)
LYMPHS: 28 %
Lymphocytes Absolute: 1.9 10*3/uL (ref 0.7–3.1)
MCH: 36.6 pg — AB (ref 26.6–33.0)
MCHC: 33.9 g/dL (ref 31.5–35.7)
MCV: 108 fL — AB (ref 79–97)
MID (ABSOLUTE): 1.1 10*3/uL (ref 0.1–1.6)
MID: 16 %
NEUTROS PCT: 56 %
Neutrophils Absolute: 3.8 10*3/uL (ref 1.4–7.0)
Platelets: 180 10*3/uL (ref 150–379)
RBC: 4.18 x10E6/uL (ref 4.14–5.80)
RDW: 13.3 % (ref 12.3–15.4)
WBC: 6.8 10*3/uL (ref 3.4–10.8)

## 2016-11-05 LAB — MICROSCOPIC EXAMINATION
Bacteria, UA: NONE SEEN
RBC, UA: NONE SEEN /hpf (ref 0–?)
WBC UA: NONE SEEN /HPF (ref 0–?)

## 2016-11-05 LAB — BAYER DCA HB A1C WAIVED: HB A1C: 7 % — AB (ref <7.0)

## 2016-11-05 MED ORDER — METFORMIN HCL 500 MG PO TABS
500.0000 mg | ORAL_TABLET | Freq: Two times a day (BID) | ORAL | 2 refills | Status: DC
Start: 2016-11-05 — End: 2017-11-08

## 2016-11-05 NOTE — Assessment & Plan Note (Signed)
Symptoms mostly from dehydration and diabetes.

## 2016-11-05 NOTE — Assessment & Plan Note (Signed)
See other note

## 2016-11-05 NOTE — Progress Notes (Signed)
BP 126/84   Pulse (!) 111   Wt 260 lb (117.9 kg)   SpO2 98%   BMI 34.30 kg/m    Subjective:    Patient ID: Harold Tate, male    DOB: [birth year], 51 y.o.   MRN: [id number]  HPI: Harold Tate is a 51 y.o. male  Chief Complaint  Patient presents with  . Heat Exposure    3 episodes over the last 2 weeks, light headed, dizziness, weakness in arms. When cooled off feels better.   Patient of concern is been having chest pressure type sensation coming on occasionally with exertion going away with rest has felt better this weekend with being cooler and patient hasn't worked outside. Has been craving banana sandwiches noticed his urine is been very dark and limited volume. No radiation of chest symptoms no nausea vomiting. Patient's been very sweaty but it's been in the heat. Patient does drink 3 beers in the evening. Patient has continued to take Benzapril and clonazepam.   Relevant past medical, surgical, family and social history reviewed and updated as indicated. Interim medical history since our last visit reviewed. Allergies and medications reviewed and updated.  Review of Systems  Constitutional: Negative.   Respiratory: Negative.   Cardiovascular: Negative.     Per HPI unless specifically indicated above     Objective:    BP 126/84   Pulse (!) 111   Wt 260 lb (117.9 kg)   SpO2 98%   BMI 34.30 kg/m   Wt Readings from Last 3 Encounters:  11/05/16 260 lb (117.9 kg)  10/23/16 264 lb (119.7 kg)  08/29/16 262 lb 4.8 oz (119 kg)    Physical Exam  Constitutional: He is oriented to person, place, and time. He appears well-developed and well-nourished.  HENT:  Head: Normocephalic and atraumatic.  Eyes: Conjunctivae and EOM are normal.  Neck: Normal range of motion.  Cardiovascular: Normal rate, regular rhythm and normal heart sounds.   Pulmonary/Chest: Effort normal and breath sounds normal.  Musculoskeletal: Normal range of motion.  Neurological: He is alert and  oriented to person, place, and time.  Skin: No erythema.  Psychiatric: He has a normal mood and affect. His behavior is normal. Judgment and thought content normal.    EKG showing sinus tachycardia otherwise normal. Urine showing 3+ glucose and ketones also mildly concentrated with specific gravity of 1.020. Hemoglobin A1c 7 CBC with no anemia normal white count    Assessment & Plan:   Problem List Items Addressed This Visit      Endocrine   DM (diabetes mellitus), type 2 ([place])    See other note.      Relevant Medications   metFORMIN (GLUCOPHAGE) 500 MG tablet     Other   Chest pain    Symptoms mostly from dehydration and diabetes.      Relevant Orders   Basic metabolic panel   CBC With Differential/Platelet   Urinalysis, Routine w reflex microscopic   EKG 12-Lead (Completed)    Other Visit Diagnoses    Glucosuria    -  Primary   Relevant Orders   Bayer DCA Hb A1c Waived     Patient with new diagnosis of diabetes reviewed glucose from visit 3 months ago and was 104. With diabetes most likely explaining patient's symptoms along with dehydration. Reviewed dietary measures weight loss and nutrition. Reviewed starting metformin 500 at bedtime increasing to 500 twice a day in one week. Will not start diabetes education or glucometer  at this time we'll looked at this later as patient very reluctant Patient still hasn't done sleep study again discussed importance for weight loss.  Patient's cardiovascular if change in symptoms are persisting will reevaluate Discuss hydration and proper fluids and fluid replacement in this heat.  Follow up plan: Return for Recheck diabetes.

## 2016-11-06 ENCOUNTER — Encounter: Payer: Self-pay | Admitting: Family Medicine

## 2016-11-06 LAB — BASIC METABOLIC PANEL
BUN / CREAT RATIO: 8 — AB (ref 9–20)
BUN: 7 mg/dL (ref 6–24)
CO2: 24 mmol/L (ref 20–29)
CREATININE: 0.89 mg/dL (ref 0.76–1.27)
Calcium: 9.5 mg/dL (ref 8.7–10.2)
Chloride: 99 mmol/L (ref 96–106)
GFR, EST AFRICAN AMERICAN: 115 mL/min/{1.73_m2} (ref >59)
GFR, EST NON AFRICAN AMERICAN: 100 mL/min/{1.73_m2} (ref >59)
Glucose: 177 mg/dL — ABNORMAL HIGH (ref 65–99)
Potassium: 4.7 mmol/L (ref 3.5–5.2)
SODIUM: 140 mmol/L (ref 134–144)

## 2016-11-18 ENCOUNTER — Ambulatory Visit: Payer: BLUE CROSS/BLUE SHIELD | Admitting: Family Medicine

## 2016-11-19 ENCOUNTER — Telehealth: Payer: Self-pay | Admitting: Family Medicine

## 2016-11-19 NOTE — Telephone Encounter (Signed)
Patient was transferred to provider for telephone conversation.   

## 2016-11-19 NOTE — Telephone Encounter (Signed)
call

## 2016-11-19 NOTE — Telephone Encounter (Signed)
Left message on machine for pt to return call to the office.  

## 2016-11-25 ENCOUNTER — Ambulatory Visit: Payer: BLUE CROSS/BLUE SHIELD | Admitting: Family Medicine

## 2017-02-08 ENCOUNTER — Other Ambulatory Visit: Payer: Self-pay | Admitting: Family Medicine

## 2017-02-10 NOTE — Telephone Encounter (Signed)
Last OV: 11/05/16 Next OV: None on file.   BMP Latest Ref Rng & Units 11/05/2016 07/25/2016 06/05/2016  Glucose 65 - 99 mg/dL 177(H) 104(H) 142(H)  BUN 6 - 24 mg/dL 7 10 11   Creatinine 0.76 - 1.27 mg/dL 0.89 0.89 0.78  BUN/Creat Ratio 9 - 20 8(L) - 14  Sodium 134 - 144 mmol/L 140 134(L) 138  Potassium 3.5 - 5.2 mmol/L 4.7 4.0 4.3  Chloride 96 - 106 mmol/L 99 102 97  CO2 20 - 29 mmol/L 24 24 24   Calcium 8.7 - 10.2 mg/dL 9.5 9.6 9.2

## 2017-02-10 NOTE — Telephone Encounter (Signed)
apt 

## 2017-02-19 ENCOUNTER — Ambulatory Visit: Payer: BLUE CROSS/BLUE SHIELD | Admitting: Family Medicine

## 2017-05-08 ENCOUNTER — Telehealth: Payer: Self-pay | Admitting: Family Medicine

## 2017-05-08 ENCOUNTER — Other Ambulatory Visit: Payer: Self-pay | Admitting: Family Medicine

## 2017-05-08 DIAGNOSIS — F411 Generalized anxiety disorder: Secondary | ICD-10-CM

## 2017-05-08 MED ORDER — CLONAZEPAM 0.5 MG PO TABS: 0.5000 mg | ORAL_TABLET | Freq: Three times a day (TID) | ORAL | 1 refills | Status: DC | PRN

## 2017-05-08 NOTE — Telephone Encounter (Signed)
Copied from [place] [id number]. Topic: Quick Communication - See Telephone Encounter >> May 08, 2017  9:24 AM Ahmed Prima L wrote: CRM for notification. See Telephone encounter for:   05/08/17.  clonazePAM (KLONOPIN) 0.5 MG tablet OV 05/20/17  [facility] - [place], [place] - [address]

## 2017-05-08 NOTE — Telephone Encounter (Signed)
Controlled substance 

## 2017-05-13 ENCOUNTER — Ambulatory Visit: Payer: BLUE CROSS/BLUE SHIELD | Admitting: Family Medicine

## 2017-05-20 ENCOUNTER — Ambulatory Visit: Payer: BLUE CROSS/BLUE SHIELD | Admitting: Family Medicine

## 2017-06-18 ENCOUNTER — Ambulatory Visit: Payer: BLUE CROSS/BLUE SHIELD | Admitting: Family Medicine

## 2017-06-18 ENCOUNTER — Encounter: Payer: Self-pay | Admitting: Family Medicine

## 2017-06-18 VITALS — BP 136/80 | HR 69 | Ht 73.0 in | Wt 263.0 lb

## 2017-06-18 DIAGNOSIS — I10 Essential (primary) hypertension: Secondary | ICD-10-CM

## 2017-06-18 DIAGNOSIS — E119 Type 2 diabetes mellitus without complications: Secondary | ICD-10-CM

## 2017-06-18 DIAGNOSIS — F411 Generalized anxiety disorder: Secondary | ICD-10-CM | POA: Diagnosis not present

## 2017-06-18 DIAGNOSIS — M21611 Bunion of right foot: Secondary | ICD-10-CM | POA: Diagnosis not present

## 2017-06-18 LAB — BAYER DCA HB A1C WAIVED: HB A1C (BAYER DCA - WAIVED): 5.8 % (ref <7.0)

## 2017-06-18 MED ORDER — BENAZEPRIL HCL 40 MG PO TABS: 40.0000 mg | ORAL_TABLET | Freq: Every day | ORAL | 1 refills | Status: DC

## 2017-06-18 MED ORDER — CLONAZEPAM 0.5 MG PO TABS: 0.5000 mg | ORAL_TABLET | Freq: Three times a day (TID) | ORAL | 4 refills | Status: DC | PRN

## 2017-06-18 NOTE — Assessment & Plan Note (Signed)
The current medical regimen is effective;  continue present plan and medications.  

## 2017-06-18 NOTE — Assessment & Plan Note (Signed)
never started metformin.  Did a better diet and hemoglobin A1c now down to 5.8. Stay off metformin continue diet work on weight loss next.

## 2017-06-18 NOTE — Progress Notes (Signed)
BP 136/80 (BP Location: Left Arm)   Pulse 69   Ht 6\' 1"  (1.854 m)   Wt 263 lb (119.3 kg)   SpO2 99%   BMI 34.70 kg/m    Subjective:    Patient ID: Harold Tate, male    DOB: 04-Aug-1965, 52 y.o.   MRN: [id number]  HPI: Harold Tate is a 52 y.o. male  Chief Complaint  Patient presents with  . Medication Refill  Patient all in all doing well has been trying to eat better checking his own blood sugar and has been in the 120 range.  With no low blood sugar spells and is stopped metformin and continued to do well with low blood sugars. While taking metformin did not have any side effects. Patient does have some concerns about developing bunion especially his right great toe.  Having trouble with some shoe fit. Well controlled with clonazepam usage faithfully without issues or increasing dose.   Relevant past medical, surgical, family and social history reviewed and updated as indicated. Interim medical history since our last visit reviewed. Allergies and medications reviewed and updated.  Review of Systems  Constitutional: Negative.   Respiratory: Negative.   Cardiovascular: Negative.     Per HPI unless specifically indicated above     Objective:    BP 136/80 (BP Location: Left Arm)   Pulse 69   Ht 6\' 1"  (1.854 m)   Wt 263 lb (119.3 kg)   SpO2 99%   BMI 34.70 kg/m   Wt Readings from Last 3 Encounters:  06/18/17 263 lb (119.3 kg)  11/05/16 260 lb (117.9 kg)  10/23/16 264 lb (119.7 kg)    Physical Exam  Constitutional: He is oriented to person, place, and time. He appears well-developed and well-nourished.  HENT:  Head: Normocephalic and atraumatic.  Eyes: Conjunctivae and EOM are normal.  Neck: Normal range of motion.  Cardiovascular: Normal rate, regular rhythm and normal heart sounds.  Pulmonary/Chest: Effort normal and breath sounds normal.  Musculoskeletal: Normal range of motion.  Prominent bunion right great toe  Neurological: He is alert and oriented  to person, place, and time.  Skin: No erythema.  Psychiatric: He has a normal mood and affect. His behavior is normal. Judgment and thought content normal.    Results for orders placed or performed in visit on 11/05/16  Microscopic Examination  Result Value Ref Range   WBC, UA None seen 0 - 5 /hpf   RBC, UA None seen 0 - 2 /hpf   Epithelial Cells (non renal) 0-10 0 - 10 /hpf   Mucus, UA Present (A) Not Estab.   Bacteria, UA None seen None seen/Few  Basic metabolic panel  Result Value Ref Range   Glucose 177 (H) 65 - 99 mg/dL   BUN 7 6 - 24 mg/dL   Creatinine, Ser 0.89 0.76 - 1.27 mg/dL   GFR calc non Af Amer 100 >59 mL/min/1.73   GFR calc Af Amer 115 >59 mL/min/1.73   BUN/Creatinine Ratio 8 (L) 9 - 20   Sodium 140 134 - 144 mmol/L   Potassium 4.7 3.5 - 5.2 mmol/L   Chloride 99 96 - 106 mmol/L   CO2 24 20 - 29 mmol/L   Calcium 9.5 8.7 - 10.2 mg/dL  CBC With Differential/Platelet  Result Value Ref Range   WBC 6.8 3.4 - 10.8 x10E3/uL   RBC 4.18 4.14 - 5.80 x10E6/uL   Hemoglobin 15.3 13.0 - 17.7 g/dL   Hematocrit 45.1 37.5 -  51.0 %   MCV 108 (H) 79 - 97 fL   MCH 36.6 (H) 26.6 - 33.0 pg   MCHC 33.9 31.5 - 35.7 g/dL   RDW 13.3 12.3 - 15.4 %   Platelets 180 150 - 379 x10E3/uL   Neutrophils 56 Not Estab. %   Lymphs 28 Not Estab. %   MID 16 Not Estab. %   Neutrophils Absolute 3.8 1.4 - 7.0 x10E3/uL   Lymphocytes Absolute 1.9 0.7 - 3.1 x10E3/uL   MID (Absolute) 1.1 0.1 - 1.6 X10E3/uL  Urinalysis, Routine w reflex microscopic  Result Value Ref Range   Specific Gravity, UA 1.020 1.005 - 1.030   pH, UA 5.5 5.0 - 7.5   Color, UA Yellow Yellow   Appearance Ur Clear Clear   Leukocytes, UA Negative Negative   Protein, UA Negative Negative/Trace   Glucose, UA 3+ (A) Negative   Ketones, UA Trace (A) Negative   RBC, UA Negative Negative   Bilirubin, UA Negative Negative   Urobilinogen, Ur 1.0 0.2 - 1.0 mg/dL   Nitrite, UA Negative Negative   Microscopic Examination See below:     Bayer DCA Hb A1c Waived  Result Value Ref Range   Bayer DCA Hb A1c Waived 7.0 (H) <7.0 %      Assessment & Plan:   Problem List Items Addressed This Visit      Cardiovascular and Mediastinum   Benign hypertension (Chronic)    The current medical regimen is effective;  continue present plan and medications.       Relevant Medications   benazepril (LOTENSIN) 40 MG tablet   Other Relevant Orders   Basic metabolic panel     Endocrine   DM (diabetes mellitus), type 2 ([place]) - Primary    never started metformin.  Did a better diet and hemoglobin A1c now down to 5.8. Stay off metformin continue diet work on weight loss next.      Relevant Medications   benazepril (LOTENSIN) 40 MG tablet   Other Relevant Orders   Bayer DCA Hb A1c Waived     Musculoskeletal and Integument   Bunion of great toe of right foot    Bunions gone problematic difficult getting shoes that fit wants to have evaluated      Relevant Orders   Ambulatory referral to Podiatry     Other   Generalized anxiety disorder (Chronic)    Continued generalized anxiety use of clonazepam 0.5 3 times daily has remained consistent      Relevant Medications   clonazePAM (KLONOPIN) 0.5 MG tablet       Follow up plan: Return in about 3 months (around 09/15/2017) for Hemoglobin A1c, Physical Exam.

## 2017-06-18 NOTE — Assessment & Plan Note (Signed)
Continued generalized anxiety use of clonazepam 0.5 3 times daily has remained consistent

## 2017-06-18 NOTE — Assessment & Plan Note (Signed)
Bunions gone problematic difficult getting shoes that fit wants to have evaluated

## 2017-06-19 ENCOUNTER — Encounter: Payer: Self-pay | Admitting: Family Medicine

## 2017-06-19 LAB — BASIC METABOLIC PANEL
BUN / CREAT RATIO: 11 (ref 9–20)
BUN: 8 mg/dL (ref 6–24)
CO2: 23 mmol/L (ref 20–29)
CREATININE: 0.72 mg/dL — AB (ref 0.76–1.27)
Calcium: 9.1 mg/dL (ref 8.7–10.2)
Chloride: 97 mmol/L (ref 96–106)
GFR calc Af Amer: 125 mL/min/{1.73_m2} (ref >59)
GFR, EST NON AFRICAN AMERICAN: 108 mL/min/{1.73_m2} (ref >59)
GLUCOSE: 106 mg/dL — AB (ref 65–99)
POTASSIUM: 4.3 mmol/L (ref 3.5–5.2)
SODIUM: 136 mmol/L (ref 134–144)

## 2017-07-23 ENCOUNTER — Ambulatory Visit: Payer: Self-pay | Admitting: Podiatry

## 2017-10-03 ENCOUNTER — Telehealth: Payer: Self-pay | Admitting: Family Medicine

## 2017-10-03 NOTE — Telephone Encounter (Signed)
Unfortunately, I just got this, and I am not able to see the patient now. He will need to be seen next week and if he doesn't think he can wait until then, he will need to go to the urgent care.

## 2017-10-03 NOTE — Telephone Encounter (Signed)
Copied from [place] [id number]. Topic: Appointment Scheduling - Scheduling Inquiry for Clinic >> Oct 01, 2017  9:13 AM Conception Chancy, NT wrote: Reason for CRM: patient is calling and states he has had weakness in his legs and would like to know what he should do. The office is full this week. He states he injured his legs in 2014. Okay to leave voicemail. Please advise.  >> Oct 02, 2017  7:03 PM Ivar Drape wrote: Patient called to check on his request.  He said he is concerned because it has been two days and no one has given him a call back.  >> Oct 03, 2017  4:39 PM Don Perking M wrote: Sorry for the delay but I just saw this message.

## 2017-10-03 NOTE — Telephone Encounter (Signed)
Spoke with patient, he states that he has a history of a blood clot, informed patient that with a history of a blood clot that he needs to go to urgent care to be evaluated.

## 2017-10-15 ENCOUNTER — Encounter: Payer: BLUE CROSS/BLUE SHIELD | Admitting: Family Medicine

## 2017-11-08 ENCOUNTER — Other Ambulatory Visit: Payer: Self-pay

## 2017-11-08 ENCOUNTER — Ambulatory Visit
Admission: EM | Admit: 2017-11-08 | Discharge: 2017-11-08 | Disposition: A | Discharge status: Home / Self Care | Payer: BLUE CROSS/BLUE SHIELD | Attending: Emergency Medicine | Admitting: Emergency Medicine

## 2017-11-08 DIAGNOSIS — K047 Periapical abscess without sinus: Secondary | ICD-10-CM

## 2017-11-08 DIAGNOSIS — K0889 Other specified disorders of teeth and supporting structures: Secondary | ICD-10-CM

## 2017-11-08 DIAGNOSIS — M272 Inflammatory conditions of jaws: Secondary | ICD-10-CM

## 2017-11-08 HISTORY — DX: Type 2 diabetes mellitus without complications: E11.9

## 2017-11-08 MED ORDER — HYDROCODONE-ACETAMINOPHEN 5-325 MG PO TABS: 1.0000 | ORAL_TABLET | Freq: Four times a day (QID) | ORAL | 0 refills | Status: DC | PRN

## 2017-11-08 MED ORDER — KETOROLAC TROMETHAMINE 60 MG/2ML IM SOLN
30.0000 mg | Freq: Once | INTRAMUSCULAR | Status: AC
  Administered 2017-11-08: 30 mg via INTRAMUSCULAR

## 2017-11-08 MED ORDER — IBUPROFEN 600 MG PO TABS: 600.0000 mg | ORAL_TABLET | Freq: Four times a day (QID) | ORAL | 0 refills | Status: DC | PRN

## 2017-11-08 MED ORDER — PENICILLIN V POTASSIUM 500 MG PO TABS: 500.0000 mg | ORAL_TABLET | Freq: Four times a day (QID) | ORAL | 0 refills | Status: AC

## 2017-11-08 NOTE — ED Provider Notes (Signed)
HPI  SUBJECTIVE:  Harold Tate is a 52 y.o. male who presents with swollen gums and dental pain for the past 3 weeks.  States it is constant.  Is unable to characterize the pain.  Wife notes right cheek facial swelling starting this morning.  No fevers, trismus.  Patient is not sure if he has had any purulent from his gums.  No swelling of the gun.  He states that this pain radiates to his temple and to behind his eye.  He tried Advil 600 mg, BC powders with temporary relief, warm salt water gargles, peroxide rinses.  Last dose of ibuprofen was within 6 to 8 hours of evaluation.  No aggravating factors.  He denies history of diabetes.  He has a history of hypertension.  He is a smoker.  PMD: Guadalupe Maple, MD  Dentist: None.    Past Medical History:  Diagnosis Date  . Anxiety   . Diabetes mellitus without complication ([place])   . Hypertension   . Raynaud's syndrome     Past Surgical History:  Procedure Laterality Date  . OPEN REDUCTION INTERNAL FIXATION (ORIF) TIBIA/FIBULA FRACTURE Left July 2014    History reviewed. No pertinent family history.  Social History   Tobacco Use  . Smoking status: Current Every Day Smoker    Packs/day: 1.00    Years: 27.00    Pack years: 27.00    Types: Cigarettes  . Smokeless tobacco: Never Used  Substance Use Topics  . Alcohol use: Yes    Alcohol/week: 21.0 oz    Types: 35 Cans of beer per week  . Drug use: Not Currently    Types: Benzodiazepines    No current facility-administered medications for this encounter.   Current Outpatient Medications:  .  benazepril (LOTENSIN) 40 MG tablet, Take 1 tablet (40 mg total) by mouth daily., Disp: 90 tablet, Rfl: 1 .  clonazePAM (KLONOPIN) 0.5 MG tablet, Take 1 tablet (0.5 mg total) by mouth 3 (three) times daily as needed for anxiety., Disp: 90 tablet, Rfl: 4 .  HYDROcodone-acetaminophen (NORCO/VICODIN) 5-325 MG tablet, Take 1-2 tablets by mouth every 6 (six) hours as needed for moderate pain or  severe pain., Disp: 20 tablet, Rfl: 0 .  ibuprofen (ADVIL,MOTRIN) 600 MG tablet, Take 1 tablet (600 mg total) by mouth every 6 (six) hours as needed., Disp: 30 tablet, Rfl: 0 .  penicillin v potassium (VEETID) 500 MG tablet, Take 1 tablet (500 mg total) by mouth 4 (four) times daily for 7 days., Disp: 28 tablet, Rfl: 0  No Known Allergies   ROS  As noted in HPI.   Physical Exam  BP (!) 151/98 (BP Location: Left Arm)   Pulse 95   Temp 98.5 F (36.9 C) (Oral)   Resp 18   Ht 6\' 1"  (1.854 m)   Wt 240 lb (108.9 kg)   SpO2 97%   BMI 31.66 kg/m   Constitutional: Well developed, well nourished, moderate painful distress Eyes:  EOMI, conjunctiva normal bilaterally HENT: Normocephalic, atraumatic,mucus membranes moist.  Mild swelling of the R anterior cheek.  No erythema, tenderness, and duration.  Multiple nontender crowns on the right upper side.  Teeth upper right side otherwise normal, nontender.  Positive swollen, tender gums.  Positive exquisitely tender swelling of the anterior hard palate.  No trismus.  No swelling under the tongue.  No swelling under the jaw. Neck: No cervical lymphadenopathy.   Respiratory: Normal inspiratory effort Cardiovascular: Normal rate GI: nondistended skin: No rash, skin  intact Musculoskeletal: no deformities Neurologic: Alert & oriented x 3, no focal neuro deficits Psychiatric: Speech and behavior appropriate   ED Course   Medications  ketorolac (TORADOL) injection 30 mg (30 mg Intramuscular Given 11/08/17 1617)    No orders of the defined types were placed in this encounter.   No results found for this or any previous visit (from the past 24 hour(s)). No results found.  ED Clinical Impression  Hard palate abscess  Dental infection   ED Assessment/Plan  [place] Narcotic database reviewed for this patient, and feel that the risk/benefit ratio today is favorable for proceeding with a prescription for controlled substance.  No opiate  prescriptions since May 2018.   Patient with a dental abscess, and an abscess of his hard palate. Do Not feel comfortable draining this here.  [facility] in [place] to have this done.  Canine space abscess in the differential, but this is less likely.  He has extensive gingivitis.  Will send home with penicillin, ibuprofen 600 mg take with 1 g of Tylenol or ibuprofen and Norco for severe pain.  Advised warm salt water gargles.  Discussed this with wife.  Giving shot of Toradol here.  Will provide dental referral list as well.  Discussed MDM, treatment plan, and plan for follow-up with patient.  Discussed signs and symptoms that should prompt return to the emergency department patient agrees with plan.   Meds ordered this encounter  Medications  . ketorolac (TORADOL) injection 30 mg  . HYDROcodone-acetaminophen (NORCO/VICODIN) 5-325 MG tablet    Sig: Take 1-2 tablets by mouth every 6 (six) hours as needed for moderate pain or severe pain.    Dispense:  20 tablet    Refill:  0  . penicillin v potassium (VEETID) 500 MG tablet    Sig: Take 1 tablet (500 mg total) by mouth 4 (four) times daily for 7 days.    Dispense:  28 tablet    Refill:  0  . ibuprofen (ADVIL,MOTRIN) 600 MG tablet    Sig: Take 1 tablet (600 mg total) by mouth every 6 (six) hours as needed.    Dispense:  30 tablet    Refill:  0    *This clinic note was created using [employer]. Therefore, there may be occasional mistakes despite careful proofreading.   ?   Melynda Ripple, MD 11/08/17 1656

## 2017-11-08 NOTE — ED Triage Notes (Signed)
Pt with right sided dental pain x past few weeks. States both upper and lower jaw pain and some facial swelling.

## 2017-11-08 NOTE — Discharge Instructions (Addendum)
Warm salt water gargles.  This may rupture on its own.  Ibuprofen and Tylenol together 3 or 4 times a day or ibuprofen and Norco together 3 or 4 times a day as we discussed.  Follow-up with a dentist of your choice for an oral surgeon ASAP.  You need to have this drained and the underlying cause addressed.  OPTIONS FOR DENTAL FOLLOW UP CARE  [email or web address] [phone])  [facility] [phone])  [place] [phone])  [place] [phone])  [facility] [phone]) This clinic caters to the indigent population and is on a lottery system. Location: [employer] [facility], [place], [place], [place] Clinic Hours: Wednesdays from 6pm - 9pm, patients seen by a lottery system. For dates, call or go to [email or web address] Services: Cleanings, fillings and simple extractions. Payment Options: DENTAL WORK IS FREE OF CHARGE. Bring proof of income or support. Best way to get seen: Arrive at 5:15 pm - this is a lottery, NOT first come/first serve, so arriving earlier will not increase your chances of being seen.     [facility] [phone] Select option 1 for emergencies   Location: [facility], [place], [address], [place] Clinic Hours: No walk-ins accepted - call the day before to schedule an appointment. Check in times are 9:30 am and 1:30 pm. Services: Simple extractions, temporary fillings, pulpectomy/pulp debridement, uncomplicated abscess drainage. Payment Options: PAYMENT IS DUE AT THE TIME OF SERVICE.  Fee is usually $100-200, additional surgical procedures (e.g. abscess drainage) may be extra. Cash, checks, Visa/MasterCard accepted.  Can file Medicaid if  patient is covered for dental - patient should call case worker to check. No discount for [facility] patients. Best way to get seen: MUST call the day before and get onto the schedule. Can usually be seen the next 1-2 days. No walk-ins accepted.     [place] [phone]   Location: [place], [place] Clinic Hours: M, W, Th, F 8am or 1:30pm, Tues 9a or 1:30 - first come/first served. Services: Simple extractions, temporary fillings, uncomplicated abscess drainage.  You do not need to be an [facility] resident. Payment Options: PAYMENT IS DUE AT THE TIME OF SERVICE. Dental insurance, otherwise sliding scale - bring proof of income or support. Depending on income and treatment needed, cost is usually $50-200. Best way to get seen: Arrive early as it is first come/first served.     [facility] [phone]   Location: [place] [facility] Hours: Mon-Thu 8a-5p Services: Most basic dental services including extractions and fillings. Payment Options: PAYMENT IS DUE AT THE TIME OF SERVICE. Sliding scale, up to 50% off - bring proof if income or support. Medicaid with dental option accepted. Best way to get seen: Call to schedule an appointment, can usually be seen within 2 weeks OR they will try to see walk-ins - show up at [place] or 2p (you may have to wait).     [facility] RESIDENTS ONLY   Location: [facility], [place] [address], [place], [place] [address] Clinic Hours: By appointment only. Monday - Thursday 8am-5pm, Friday 8am-12pm Services: Cleanings, fillings, extractions. Payment Options: PAYMENT IS DUE AT THE TIME OF SERVICE. Cash, Visa or MasterCard. Sliding scale - $  30 minimum per service. Best way to get seen: Come in to office, complete packet and make an appointment - need proof of income or  support monies for each household member and proof of [facility] residence. Usually takes about a month to get in.     [facility] [phone]   Location: [address] [place] Clinic Hours: Walk-in Urgent Care Dental Services are offered Monday-Friday mornings only. The numbers of emergencies accepted daily is limited to the number of providers available. Maximum 15 - Mondays, Wednesdays & Thursdays Maximum 10 - Tuesdays & Fridays Services: You do not need to be a [facility] resident to be seen for a dental emergency. Emergencies are defined as pain, swelling, abnormal bleeding, or dental trauma. Walkins will receive x-rays if needed. NOTE: Dental cleaning is not an emergency. Payment Options: PAYMENT IS DUE AT THE TIME OF SERVICE. Minimum co-pay is $40.00 for uninsured patients. Minimum co-pay is $3.00 for Medicaid with dental coverage. Dental Insurance is accepted and must be presented at time of visit. Medicare does not cover dental. Forms of payment: Cash, credit card, checks. Best way to get seen: If not previously registered with the clinic, walk-in dental registration begins at 7:15 am and is on a first come/first serve basis. If previously registered with the clinic, call to make an appointment.     [facility] [place] ONLY   Location: [address], [place], [place] Clinic Hours: Mon-Thu 10a-2p Services: Extractions only! Payment Options: FREE (donations accepted) - bring proof of income or support Best way to get seen: Call and schedule an appointment OR come at 8am on the 1st Monday of every month (except for holidays) when it is first come/first served.     [facility] [phone]   Location: [place], [place] Clinic Hours: Friday mornings Services, Payment Options, Best way to get seen: Call for info

## 2017-11-13 ENCOUNTER — Other Ambulatory Visit: Payer: Self-pay | Admitting: Family Medicine

## 2017-11-13 ENCOUNTER — Encounter: Payer: Self-pay | Admitting: Family Medicine

## 2017-11-13 ENCOUNTER — Ambulatory Visit: Payer: Self-pay | Admitting: Family Medicine

## 2017-11-13 VITALS — BP 147/77 | HR 103 | Temp 98.5°F | Ht 73.0 in | Wt 235.3 lb

## 2017-11-13 DIAGNOSIS — F411 Generalized anxiety disorder: Secondary | ICD-10-CM

## 2017-11-13 MED ORDER — CLONAZEPAM 0.5 MG PO TABS: 0.5000 mg | ORAL_TABLET | Freq: Three times a day (TID) | ORAL | 2 refills | Status: DC | PRN

## 2017-11-13 NOTE — Progress Notes (Signed)
   BP (!) 147/77 (BP Location: Left Arm, Patient Position: Sitting, Cuff Size: Normal)   Pulse (!) 103   Temp 98.5 F (36.9 C) (Oral)   Ht 6\' 1"  (1.854 m)   Wt 235 lb 4.8 oz (106.7 kg)   SpO2 97%   BMI 31.04 kg/m    Subjective:    Patient ID: Harold Tate, male    DOB: Jun 12, 1965, 52 y.o.   MRN: [id number]  HPI: Harold Tate is a 52 y.o. male  Chief Complaint  Patient presents with  . Medication Refill    Needs medication refill on clonzepam.   . Anxiety   Pt here today for anxiety f/u. Doing well on klonopin TID prn. Denies side effects, panic episodes.   Relevant past medical, surgical, family and social history reviewed and updated as indicated. Interim medical history since our last visit reviewed. Allergies and medications reviewed and updated.  Review of Systems  Per HPI unless specifically indicated above     Objective:    BP (!) 147/77 (BP Location: Left Arm, Patient Position: Sitting, Cuff Size: Normal)   Pulse (!) 103   Temp 98.5 F (36.9 C) (Oral)   Ht 6\' 1"  (1.854 m)   Wt 235 lb 4.8 oz (106.7 kg)   SpO2 97%   BMI 31.04 kg/m   Wt Readings from Last 3 Encounters:  11/13/17 235 lb 4.8 oz (106.7 kg)  11/08/17 240 lb (108.9 kg)  06/18/17 263 lb (119.3 kg)    Physical Exam  Constitutional: He is oriented to person, place, and time. He appears well-developed and well-nourished. No distress.  HENT:  Head: Atraumatic.  Eyes: EOM are normal.  Neck: Normal range of motion.  Cardiovascular: Normal rate and regular rhythm.  Pulmonary/Chest: Effort normal and breath sounds normal.  Musculoskeletal: Normal range of motion.  Neurological: He is alert and oriented to person, place, and time.  Skin: Skin is warm and dry.  Psychiatric: He has a normal mood and affect. His behavior is normal.  Nursing note and vitals reviewed.   Results for orders placed or performed in visit on 06/18/17  Bayer DCA Hb A1c Waived  Result Value Ref Range   HB A1C (BAYER DCA  - WAIVED) 5.8 <3.2 %  Basic metabolic panel  Result Value Ref Range   Glucose 106 (H) 65 - 99 mg/dL   BUN 8 6 - 24 mg/dL   Creatinine, Ser 0.72 (L) 0.76 - 1.27 mg/dL   GFR calc non Af Amer 108 >59 mL/min/1.73   GFR calc Af Amer 125 >59 mL/min/1.73   BUN/Creatinine Ratio 11 9 - 20   Sodium 136 134 - 144 mmol/L   Potassium 4.3 3.5 - 5.2 mmol/L   Chloride 97 96 - 106 mmol/L   CO2 23 20 - 29 mmol/L   Calcium 9.1 8.7 - 10.2 mg/dL      Assessment & Plan:   Problem List Items Addressed This Visit      Other   Generalized anxiety disorder - Primary (Chronic)    Stable, under good control. Continue current regimen      Relevant Medications   clonazePAM (KLONOPIN) 0.5 MG tablet       Follow up plan: Return for as scheduled for CPE.

## 2017-11-13 NOTE — Telephone Encounter (Signed)
Refill of klonopin  LRF 06/18/17  #90   4 refills  LOV   06/18/17 Dr. Jeananne Rama    [facility] [place], [place] - [address]

## 2017-11-16 NOTE — Assessment & Plan Note (Signed)
Stable, under good control. Continue current regimen 

## 2017-11-16 NOTE — Patient Instructions (Signed)
Follow up as scheduled.  

## 2018-02-06 ENCOUNTER — Telehealth: Payer: Self-pay

## 2018-02-06 DIAGNOSIS — F411 Generalized anxiety disorder: Secondary | ICD-10-CM

## 2018-02-06 NOTE — Telephone Encounter (Signed)
Clonazepam 0.5mg  tablet take one tablet 3 times a daily as needed

## 2018-02-06 NOTE — Telephone Encounter (Signed)
Not due until 10/18. Will leave for Dr. Jeananne Rama to review Monday  Copied from [place] [id number]. Topic: General - Other >> Feb 06, 2018  8:22 AM Carolyn Stare wrote: Pt req refill clonazePAM (KLONOPIN) 0.5 MG tablet   [place]

## 2018-02-07 MED ORDER — CLONAZEPAM 0.5 MG PO TABS: 0.5000 mg | ORAL_TABLET | Freq: Three times a day (TID) | ORAL | 2 refills | Status: DC | PRN

## 2018-02-09 ENCOUNTER — Encounter: Payer: Self-pay | Admitting: Family Medicine

## 2018-05-04 ENCOUNTER — Other Ambulatory Visit: Payer: Self-pay

## 2018-05-04 DIAGNOSIS — F411 Generalized anxiety disorder: Secondary | ICD-10-CM

## 2018-05-04 NOTE — Telephone Encounter (Signed)
Last visit: 11/13/2017 Fax from pharmacy-refill for Cloazepam .05mg  tablet

## 2018-05-04 NOTE — Telephone Encounter (Signed)
Called patient and pt agreed to schedule an OV for tomorrow Tuesday, 10/2018 at 10am with Jolene for med refill.

## 2018-05-05 ENCOUNTER — Ambulatory Visit: Payer: Self-pay | Admitting: Nurse Practitioner

## 2018-05-05 ENCOUNTER — Encounter: Payer: Self-pay | Admitting: Nurse Practitioner

## 2018-05-05 ENCOUNTER — Ambulatory Visit (INDEPENDENT_AMBULATORY_CARE_PROVIDER_SITE_OTHER): Payer: BLUE CROSS/BLUE SHIELD | Admitting: Nurse Practitioner

## 2018-05-05 DIAGNOSIS — F411 Generalized anxiety disorder: Secondary | ICD-10-CM

## 2018-05-05 DIAGNOSIS — I10 Essential (primary) hypertension: Secondary | ICD-10-CM | POA: Diagnosis not present

## 2018-05-05 DIAGNOSIS — E7800 Pure hypercholesterolemia, unspecified: Secondary | ICD-10-CM | POA: Diagnosis not present

## 2018-05-05 DIAGNOSIS — M79605 Pain in left leg: Secondary | ICD-10-CM

## 2018-05-05 DIAGNOSIS — M79604 Pain in right leg: Secondary | ICD-10-CM | POA: Diagnosis not present

## 2018-05-05 MED ORDER — CLONAZEPAM 0.5 MG PO TABS: 0.5000 mg | ORAL_TABLET | Freq: Three times a day (TID) | ORAL | 2 refills | Status: DC | PRN

## 2018-05-05 MED ORDER — CYCLOBENZAPRINE HCL 10 MG PO TABS: 10.0000 mg | ORAL_TABLET | Freq: Three times a day (TID) | ORAL | 0 refills | Status: DC | PRN

## 2018-05-05 NOTE — Assessment & Plan Note (Addendum)
Chronic, stable with BP below goal on recheck.  Continue current medication regimen.  Labs next visit.

## 2018-05-05 NOTE — Assessment & Plan Note (Signed)
Chronic.  Continue to work on diet and exercise regimen.  Labs next visit.

## 2018-05-05 NOTE — Progress Notes (Signed)
BP 124/86 (BP Location: Left Arm, Cuff Size: Normal)   Pulse (!) 104   Temp 97.6 F (36.4 C) (Oral)   Wt 244 lb (110.7 kg)   SpO2 99%   BMI 32.19 kg/m    Subjective:    Patient ID: Harold Tate, male    DOB: 27-Mar-1966, 53 y.o.   MRN: [id number]  HPI: Harold Tate is a 53 y.o. male presents for follow-up  Chief Complaint  Patient presents with  . Hypertension  . Anxiety  . Pain   HYPERTENSION / HYPERLIPIDEMIA Satisfied with current treatment? yes Duration of hypertension: chronic BP monitoring frequency: not checking BP range: did at one time, but not over past months BP medication side effects: no Duration of hyperlipidemia: chronic Cholesterol medication side effects: no Cholesterol supplements: none Medication compliance: good compliance Aspirin: no Recent stressors: no Recurrent headaches: no Visual changes: no Palpitations: no Dyspnea: no Chest pain: no Lower extremity edema: no Dizzy/lightheaded: no   ANXIETY/STRESS Reports stable control with current Klonopin.  Pt is aware of risks of benzo medication use to include increased sedation, respiratory suppression, falls, dependence and cardiovascular events.  Pt would like to continue treatment as benefit determined to outweigh risk.   Duration:controlled Anxious mood: no  Excessive worrying: no Irritability: no  Sweating: no Nausea: no Palpitations:no Hyperventilation: no Panic attacks: no Agoraphobia: no  Obscessions/compulsions: no Depressed mood: no Depression screen [facility] 2/9 05/05/2018 10/23/2016 06/05/2016  Decreased Interest 0 3 0  Down, Depressed, Hopeless 0 3 0  PHQ - 2 Score 0 6 0  Altered sleeping 0 3 -  Tired, decreased energy 0 3 -  Change in appetite 1 1 -  Feeling bad or failure about yourself  0 1 -  Trouble concentrating 0 1 -  Moving slowly or fidgety/restless 0 1 -  Suicidal thoughts 0 0 -  PHQ-9 Score 1 16 -  Difficult doing work/chores Not difficult at all - -   Anhedonia:  no Weight changes: no Insomnia: yes hard to stay asleep  Hypersomnia: no Fatigue/loss of energy: no Feelings of worthlessness: no Feelings of guilt: no Impaired concentration/indecisiveness: no Suicidal ideations: no  Crying spells: no Recent Stressors/Life Changes: no   Relationship problems: no   Family stress: no     Financial stress: no    Job stress: no    Recent death/loss: no   GAD 7 : Generalized Anxiety Score 05/05/2018 11/02/2015  Nervous, Anxious, on Edge 3 3  Control/stop worrying 2 3  Worry too much - different things 2 3  Trouble relaxing 2 3  Restless 2 0  Easily annoyed or irritable 0 2  Afraid - awful might happen 2 0  Total GAD 7 Score 13 14  Anxiety Difficulty Somewhat difficult Somewhat difficult   LEG/BACK PAIN: Has had bilateral leg pain for over 6 months, mainly to upper thighs.  States it "just hurts" and can not describe the pain.  The pain is present "from the time I get up to the time I go to bed".  Reports his chiropractor feels it "is from discs in my back".  Saw his chiropractor 3 times, last saw three weeks ago.  He works in [employer] and has had multiple injuries in past.  Reports pain as a 8/10 and brings tears to his eyes.  Has tried Aleeve, CBD oil, Tylenol, and Ibuprofen at home.  Recently obtained insurance, so has not had imaging yet.  Did not bring insurance card into visit today,  had recommended he bring that to next visit.  Discussed need for imaging and further assessment, possible ortho consult.  He wishes to wait for imaging order and ortho evaluation until he obtains his new insurance card.  He has h/o DVT left lower limb several years ago, but reports these are not similar symptoms.  Denies SOB, CP, swelling or warmth to LE.    Relevant past medical, surgical, family and social history reviewed and updated as indicated. Interim medical history since our last visit reviewed. Allergies and medications reviewed and updated.  Review of  Systems  Constitutional: Negative for activity change, diaphoresis, fatigue and fever.  Respiratory: Negative for cough, chest tightness, shortness of breath and wheezing.   Cardiovascular: Negative for chest pain, palpitations and leg swelling.  Gastrointestinal: Negative for abdominal distention, abdominal pain, constipation, diarrhea, nausea and vomiting.  Endocrine: Negative for cold intolerance, heat intolerance, polydipsia, polyphagia and polyuria.  Musculoskeletal: Positive for arthralgias and back pain.  Skin: Negative.   Neurological: Negative for dizziness, syncope, weakness, light-headedness, numbness and headaches.  Psychiatric/Behavioral: Negative.     Per HPI unless specifically indicated above     Objective:    BP 124/86 (BP Location: Left Arm, Cuff Size: Normal)   Pulse (!) 104   Temp 97.6 F (36.4 C) (Oral)   Wt 244 lb (110.7 kg)   SpO2 99%   BMI 32.19 kg/m   Wt Readings from Last 3 Encounters:  05/05/18 244 lb (110.7 kg)  11/13/17 235 lb 4.8 oz (106.7 kg)  11/08/17 240 lb (108.9 kg)    Physical Exam Vitals signs and nursing note reviewed.  Constitutional:      Appearance: He is well-developed.  HENT:     Head: Normocephalic and atraumatic.     Right Ear: Hearing normal. No drainage.     Left Ear: Hearing normal. No drainage.     Mouth/Throat:     Pharynx: Uvula midline.  Eyes:     General: Lids are normal.        Right eye: No discharge.        Left eye: No discharge.     Conjunctiva/sclera: Conjunctivae normal.     Pupils: Pupils are equal, round, and reactive to light.  Neck:     Musculoskeletal: Normal range of motion and neck supple.     Thyroid: No thyromegaly.     Vascular: No carotid bruit or JVD.     Trachea: Trachea normal.  Cardiovascular:     Rate and Rhythm: Normal rate and regular rhythm.     Heart sounds: Normal heart sounds, S1 normal and S2 normal. No murmur. No gallop.   Pulmonary:     Effort: Pulmonary effort is normal.      Breath sounds: Normal breath sounds.  Abdominal:     General: Bowel sounds are normal.     Palpations: Abdomen is soft. There is no hepatomegaly or splenomegaly.  Musculoskeletal:     Lumbar back: He exhibits decreased range of motion, tenderness and pain. He exhibits no swelling, no edema and no spasm.     Comments: Antalgic gait present.  Full ROM BLE, although discomfort present with ROM.  Skin:    General: Skin is warm and dry.     Capillary Refill: Capillary refill takes less than 2 seconds.     Findings: No rash.  Neurological:     Mental Status: He is alert and oriented to person, place, and time.     Deep Tendon Reflexes: Reflexes  are normal and symmetric.  Psychiatric:        Mood and Affect: Mood normal.        Behavior: Behavior normal.        Thought Content: Thought content normal.        Judgment: Judgment normal.     Results for orders placed or performed in visit on 06/18/17  Bayer DCA Hb A1c Waived  Result Value Ref Range   HB A1C (BAYER DCA - WAIVED) 5.8 <3.7 %  Basic metabolic panel  Result Value Ref Range   Glucose 106 (H) 65 - 99 mg/dL   BUN 8 6 - 24 mg/dL   Creatinine, Ser 0.72 (L) 0.76 - 1.27 mg/dL   GFR calc non Af Amer 108 >59 mL/min/1.73   GFR calc Af Amer 125 >59 mL/min/1.73   BUN/Creatinine Ratio 11 9 - 20   Sodium 136 134 - 144 mmol/L   Potassium 4.3 3.5 - 5.2 mmol/L   Chloride 97 96 - 106 mmol/L   CO2 23 20 - 29 mmol/L   Calcium 9.1 8.7 - 10.2 mg/dL      Assessment & Plan:   Problem List Items Addressed This Visit      Cardiovascular and Mediastinum   Benign hypertension (Chronic)    Chronic, stable with BP below goal on recheck.  Continue current medication regimen.  Labs next visit.        Other   Generalized anxiety disorder (Chronic)    Chronic, stable on Klonopin.  Have discussed risks/benefits with patient.  He wishes to continue current therapy.  Script for refill written.      Relevant Medications   clonazePAM (KLONOPIN)  0.5 MG tablet   Hypercholesterolemia    Chronic.  Continue to work on diet and exercise regimen.  Labs next visit.      Leg pain, bilateral    Ongoing x 6 months to bilateral legs, in presence of ongoing lower back pain.  Refuses imaging at this time or ortho consult, will obtain these once his insurance card is received.  At this time is self pay.  Will send script for Flexeril PRN.  Patient discussed opioid, Tramadol, discussed risks of use in conjunction with current daily Klonopin.  Will continue current simple treatment at home [facility], Icy/Hot patches, heating pad, stretches, and Flexeril).  Follow-up in 4 weeks or sooner if worsening symptoms.          Narcotic database reviewed for this patient, and feel that the risk/benefit ratio today is favorable for proceeding with a prescription for controlled substance.  Last refill on 04/07/18, 90 tablets for 30 days supply.  Follow up plan: Return in about 4 weeks (around 06/02/2018) for Physical exam with Dr. Jeananne Rama (need labs).

## 2018-05-05 NOTE — Assessment & Plan Note (Signed)
Ongoing x 6 months to bilateral legs, in presence of ongoing lower back pain.  Refuses imaging at this time or ortho consult, will obtain these once his insurance card is received.  At this time is self pay.  Will send script for Flexeril PRN.  Patient discussed opioid, Tramadol, discussed risks of use in conjunction with current daily Klonopin.  Will continue current simple treatment at home [facility], Icy/Hot patches, heating pad, stretches, and Flexeril).  Follow-up in 4 weeks or sooner if worsening symptoms.

## 2018-05-05 NOTE — Patient Instructions (Signed)
Acute Back Pain, Adult  Acute back pain is sudden and usually short-lived. It is often caused by an injury to the muscles and tissues in the back. The injury may result from:   A muscle or ligament getting overstretched or torn (strained). Ligaments are tissues that connect bones to each other. Lifting something improperly can cause a back strain.   Wear and tear (degeneration) of the spinal disks. Spinal disks are circular tissue that provides cushioning between the bones of the spine (vertebrae).   Twisting motions, such as while playing sports or doing yard work.   A hit to the back.   Arthritis.  You may have a physical exam, lab tests, and imaging tests to find the cause of your pain. Acute back pain usually goes away with rest and home care.  Follow these instructions at home:  Managing pain, stiffness, and swelling   Take over-the-counter and prescription medicines only as told by your health care provider.   Your health care provider may recommend applying ice during the first 24-48 hours after your pain starts. To do this:  ? Put ice in a plastic bag.  ? Place a towel between your skin and the bag.  ? Leave the ice on for 20 minutes, 2-3 times a day.   If directed, apply heat to the affected area as often as told by your health care provider. Use the heat source that your health care provider recommends, such as a moist heat pack or a heating pad.  ? Place a towel between your skin and the heat source.  ? Leave the heat on for 20-30 minutes.  ? Remove the heat if your skin turns bright red. This is especially important if you are unable to feel pain, heat, or cold. You have a greater risk of getting burned.  Activity     Do not stay in bed. Staying in bed for more than 1-2 days can delay your recovery.   Sit up and stand up straight. Avoid leaning forward when you sit, or hunching over when you stand.  ? If you work at a desk, sit close to it so you do not need to lean over. Keep your chin tucked  in. Keep your neck drawn back, and keep your elbows bent at a right angle. Your arms should look like the letter "L."  ? Sit high and close to the steering wheel when you drive. Add lower back (lumbar) support to your car seat, if needed.   Take short walks on even surfaces as soon as you are able. Try to increase the length of time you walk each day.   Do not sit, drive, or stand in one place for more than 30 minutes at a time. Sitting or standing for long periods of time can put stress on your back.   Do not drive or use heavy machinery while taking prescription pain medicine.   Use proper lifting techniques. When you bend and lift, use positions that put less stress on your back:  ? Bend your knees.  ? Keep the load close to your body.  ? Avoid twisting.   Exercise regularly as told by your health care provider. Exercising helps your back heal faster and helps prevent back injuries by keeping muscles strong and flexible.   Work with a physical therapist to make a safe exercise program, as recommended by your health care provider. Do any exercises as told by your physical therapist.  Lifestyle   Maintain   a healthy weight. Extra weight puts stress on your back and makes it difficult to have good posture.   Avoid activities or situations that make you feel anxious or stressed. Stress and anxiety increase muscle tension and can make back pain worse. Learn ways to manage anxiety and stress, such as through exercise.  General instructions   Sleep on a firm mattress in a comfortable position. Try lying on your side with your knees slightly bent. If you lie on your back, put a pillow under your knees.   Follow your treatment plan as told by your health care provider. This may include:  ? Cognitive or behavioral therapy.  ? Acupuncture or massage therapy.  ? Meditation or yoga.  Contact a health care provider if:   You have pain that is not relieved with rest or medicine.   You have increasing pain going down  into your legs or buttocks.   Your pain does not improve after 2 weeks.   You have pain at night.   You lose weight without trying.   You have a fever or chills.  Get help right away if:   You develop new bowel or bladder control problems.   You have unusual weakness or numbness in your arms or legs.   You develop nausea or vomiting.   You develop abdominal pain.   You feel faint.  Summary   Acute back pain is sudden and usually short-lived.   Use proper lifting techniques. When you bend and lift, use positions that put less stress on your back.   Take over-the-counter and prescription medicines and apply heat or ice as directed by your health care provider.  This information is not intended to replace advice given to you by your health care provider. Make sure you discuss any questions you have with your health care provider.  Document Released: 04/15/2005 Document Revised: 11/20/2017 Document Reviewed: 11/27/2016  Elsevier Interactive Patient Education  2019 [employer]vier Inc[employer]

## 2018-05-05 NOTE — Assessment & Plan Note (Signed)
Chronic, stable on Klonopin.  Have discussed risks/benefits with patient.  He wishes to continue current therapy.  Script for refill written.

## 2018-07-21 ENCOUNTER — Encounter: Payer: Self-pay | Admitting: Family Medicine

## 2018-07-21 ENCOUNTER — Other Ambulatory Visit: Payer: Self-pay

## 2018-07-21 ENCOUNTER — Ambulatory Visit (INDEPENDENT_AMBULATORY_CARE_PROVIDER_SITE_OTHER): Payer: BLUE CROSS/BLUE SHIELD | Admitting: Family Medicine

## 2018-07-21 DIAGNOSIS — I10 Essential (primary) hypertension: Secondary | ICD-10-CM

## 2018-07-21 DIAGNOSIS — M79605 Pain in left leg: Secondary | ICD-10-CM

## 2018-07-21 DIAGNOSIS — F419 Anxiety disorder, unspecified: Secondary | ICD-10-CM | POA: Diagnosis not present

## 2018-07-21 DIAGNOSIS — E7800 Pure hypercholesterolemia, unspecified: Secondary | ICD-10-CM

## 2018-07-21 DIAGNOSIS — M79604 Pain in right leg: Secondary | ICD-10-CM | POA: Diagnosis not present

## 2018-07-21 NOTE — Assessment & Plan Note (Signed)
Reportedly stable

## 2018-07-21 NOTE — Assessment & Plan Note (Signed)
The current medical regimen is effective;  continue present plan and medications.  

## 2018-07-21 NOTE — Progress Notes (Signed)
There were no vitals taken for this visit.   Subjective:    Patient ID: Harold Tate, male    DOB: 12-Jan-1966, 53 y.o.   MRN: [id number]  HPI: [name] is a 53 y.o. male  Phone visit  Virtual Visit via Telephone Note Today's visit performed via telephone visit due to COVID-19 isolation precautions I connected with Harold Tate  on 07/21/18  by telephone and verified that I am speaking with the correct person using two identifiers.   I discussed the limitations, risks, security and privacy concerns of performing an evaluation and management service by telephone and the availability of in person appointments. I also discussed with the patient that there may be a patient responsible charge related to this service. The patient expressed understanding and agreed to proceed.  Patient with complaints of low back pain with radicular symptoms causing him to have to waddling gait at times.  Patient is noticed urgency and upper leg weakness.  This is been ongoing for 8 months and is not sure how long the bathroom symptoms have lasted.  Patient does make some concerning notes about sometimes soiling his pants and having to rush to the bathroom. He has been to the chiropractor with some relief. Blood pressures been doing well no complaints from medications. Chronic anxiety has been treated for 20+ years first with Xanax now with clonazepam.  Taking 3 a day without problems discussed occasionally trying to take 2 a day.    Relevant past medical, surgical, family and social history reviewed and updated as indicated. Interim medical history since our last visit reviewed. Allergies and medications reviewed and updated.  Review of Systems  Constitutional: Negative.   Respiratory: Negative.   Cardiovascular: Negative.     Per HPI unless specifically indicated above     Objective:    There were no vitals taken for this visit.  Wt Readings from Last 3 Encounters:  05/05/18 244 lb (110.7  kg)  11/13/17 235 lb 4.8 oz (106.7 kg)  11/08/17 240 lb (108.9 kg)    Physical Exam none Results for orders placed or performed in visit on 06/18/17  Bayer DCA Hb A1c Waived  Result Value Ref Range   HB A1C (BAYER DCA - WAIVED) 5.8 <5.5 %  Basic metabolic panel  Result Value Ref Range   Glucose 106 (H) 65 - 99 mg/dL   BUN 8 6 - 24 mg/dL   Creatinine, Ser 0.72 (L) 0.76 - 1.27 mg/dL   GFR calc non Af Amer 108 >59 mL/min/1.73   GFR calc Af Amer 125 >59 mL/min/1.73   BUN/Creatinine Ratio 11 9 - 20   Sodium 136 134 - 144 mmol/L   Potassium 4.3 3.5 - 5.2 mmol/L   Chloride 97 96 - 106 mmol/L   CO2 23 20 - 29 mmol/L   Calcium 9.1 8.7 - 10.2 mg/dL      Assessment & Plan:   Problem List Items Addressed This Visit      Cardiovascular and Mediastinum   Benign hypertension (Chronic)    Reportedly stable        Other   Hypercholesterolemia    The current medical regimen is effective;  continue present plan and medications.       Leg pain, bilateral    Patient with radicular leg pain concerning for developing cauda equina syndrome. Reviewed cauda equina syndrome with patient and to proceed directly to the emergency room at [facility] without a phone call for worsening symptoms.  Discussed neurosurgery consult and probable need for MRI to evaluate his back.  We are not ordering a MRI because of insurance hassles which the neurosurgeons can overcome much easier.      Relevant Orders   Ambulatory referral to Neurosurgery   Anxiety    Discussed chronic anxiety and will continue clonazepam 3 a day with occasional trial of 2 a day.          Follow up plan: Return in about 3 months (around 10/21/2018) for Physical Exam, Hemoglobin A1c.  I discussed the assessment and treatment plan with the patient. The patient was provided an opportunity to ask questions and all were answered. The patient agreed with the plan and demonstrated an understanding of the instructions.   The patient  was advised to call back or seek an in-person evaluation if the symptoms worsen or if the condition fails to improve as anticipated.   I provided 21+ minutes of non-face-to-face time during this encounter.

## 2018-07-21 NOTE — Assessment & Plan Note (Signed)
Patient with radicular leg pain concerning for developing cauda equina syndrome. Reviewed cauda equina syndrome with patient and to proceed directly to the emergency room at [facility] without a phone call for worsening symptoms. Discussed neurosurgery consult and probable need for MRI to evaluate his back.  We are not ordering a MRI because of insurance hassles which the neurosurgeons can overcome much easier.

## 2018-07-21 NOTE — Assessment & Plan Note (Signed)
Discussed chronic anxiety and will continue clonazepam 3 a day with occasional trial of 2 a day.

## 2018-07-23 ENCOUNTER — Other Ambulatory Visit: Payer: Self-pay | Admitting: Family Medicine

## 2018-07-23 ENCOUNTER — Telehealth: Payer: Self-pay | Admitting: Family Medicine

## 2018-07-23 DIAGNOSIS — F411 Generalized anxiety disorder: Secondary | ICD-10-CM

## 2018-07-23 NOTE — Telephone Encounter (Unsigned)
Copied from [place] [id number]. Topic: Quick Communication - Rx Refill/Question >> Jul 23, 2018 11:19 AM Alanda Slim E wrote: Medication: clonazePAM (KLONOPIN) 0.5 MG tablet  benazepril (LOTENSIN) 40 MG tablet  Has the patient contacted their pharmacy? No   Preferred Pharmacy (with phone number or street name): [place], [place] [phone] (Phone) [phone] (Fax)    Agent: Please be advised that RX refills may take up to 3 business days. We ask that you follow-up with your pharmacy.

## 2018-07-23 NOTE — Telephone Encounter (Signed)
Spoke with Counsellor at [facility]. Patient does not have any more refills for clonazePAM but is also not yet due, he picked up 90 tablets on 06/29/18. Patient has not picked up benzaepril since September. Last RX for that one was from Feb. Of 2019

## 2018-07-23 NOTE — Telephone Encounter (Signed)
Requested medication (s) are due for refill today: Yes  Requested medication (s) are on the active medication list: Yes  Last refill:  05/05/18  Future visit scheduled: No  Notes to clinic:  See request    Requested Prescriptions  Pending Prescriptions Disp Refills   clonazePAM (KLONOPIN) 0.5 MG tablet [Pharmacy Med Name: CLONAZEPAM 0.5 MG TABLET] 90 tablet 0    Sig: Take 1 tablet (0.5 mg total) by mouth 3 (three) times daily as neededfor anxiety.     Not Delegated - Psychiatry:  Anxiolytics/Hypnotics Failed - 07/23/2018 12:56 PM      Failed - This refill cannot be delegated      Failed - Urine Drug Screen completed in last 360 days.      Passed - Valid encounter within last 6 months    Recent Outpatient Visits          2 days ago Benign hypertension   [facility] [place], Jeannette How, MD   2 months ago Generalized anxiety disorder   [place], [place] T, NP   8 months ago Generalized anxiety disorder   [place], [place], [place]   1 year ago Type 2 diabetes mellitus without complication, without long-term current use of insulin ([place])   [place], Jeannette How, MD   1 year ago [place], Jeannette How, MD           Signed Prescriptions Disp Refills   benazepril (LOTENSIN) 40 MG tablet 90 tablet 0    Sig: Take 1 tablet (40 mg total) by mouth daily.     Cardiovascular:  ACE Inhibitors Failed - 07/23/2018 12:56 PM      Failed - Cr in normal range and within 180 days    Creatinine  Date Value Ref Range Status  01/28/2014 0.80 0.60 - 1.30 mg/dL Final   Creatinine, Ser  Date Value Ref Range Status  06/18/2017 0.72 (L) 0.76 - 1.27 mg/dL Final         Failed - K in normal range and within 180 days    Potassium  Date Value Ref Range Status  06/18/2017 4.3 3.5 - 5.2 mmol/L Final  01/28/2014 4.1 3.5 - 5.1 mmol/L Final         Passed - Patient is not pregnant      Passed - Last BP in normal range    BP Readings from Last 1 Encounters:  05/05/18 124/86         Passed - Valid encounter within last 6 months    Recent Outpatient Visits          2 days ago Benign hypertension   [facility] Crissman, Jeannette How, MD   2 months ago Generalized anxiety disorder   [place], Henrine Screws T, NP   8 months ago Generalized anxiety disorder   [place], [place], [place]   1 year ago Type 2 diabetes mellitus without complication, without long-term current use of insulin ([place])   [place], Jeannette How, MD   1 year ago Glucosuria   [facility] Cr[place], Jeannette How, MD

## 2018-07-23 NOTE — Telephone Encounter (Signed)
Check pharm rx has been done

## 2018-07-27 ENCOUNTER — Other Ambulatory Visit: Payer: Self-pay | Admitting: Family Medicine

## 2018-07-27 MED ORDER — BENAZEPRIL HCL 40 MG PO TABS: 40.0000 mg | ORAL_TABLET | Freq: Every day | ORAL | 1 refills | Status: DC

## 2018-08-31 ENCOUNTER — Telehealth: Payer: Self-pay

## 2018-08-31 NOTE — Telephone Encounter (Signed)
Fax from [place]:  CC: Weakness  Caller states that his doctor set up a referral to go to a [facility] in [place] but they don't want to see him because of the coronavirus. He was told that if he was having trouble with his back and legs to go to the ER.   Additional comment: His legs are weak, he hurts in his inner thighs, and his back is hurting as well. Please advice

## 2018-09-01 NOTE — Telephone Encounter (Signed)
Call pt go to er

## 2018-09-01 NOTE — Telephone Encounter (Signed)
Patient notified. Patient really did not want to go to the ER. Advised patient he could try and UC first but they may send him to the ER as well. He may need urgent imaging.  Patient said he was undecided.   I advised patient to call us back if he had any other questions or concerns.

## 2018-09-01 NOTE — Telephone Encounter (Signed)
Called patient. No answer. Left VM for patient to return call to the office.

## 2018-09-14 MED ORDER — ACETAMINOPHEN 325 MG PO TABS
650.00 | ORAL_TABLET | ORAL | Status: DC
Start: ? — End: 2018-09-14

## 2018-09-14 MED ORDER — FOLIC ACID 1 MG PO TABS
1.00 | ORAL_TABLET | ORAL | Status: DC
Start: 2018-09-15 — End: 2018-09-14

## 2018-09-14 MED ORDER — NICOTINE 21 MG/24HR TD PT24
1.00 | MEDICATED_PATCH | TRANSDERMAL | Status: DC
Start: 2018-09-15 — End: 2018-09-14

## 2018-09-14 MED ORDER — IBUPROFEN 400 MG PO TABS
400.00 | ORAL_TABLET | ORAL | Status: DC
Start: ? — End: 2018-09-14

## 2018-09-14 MED ORDER — GENERIC EXTERNAL MEDICATION
100.00 | Status: DC
Start: 2018-09-14 — End: 2018-09-14

## 2018-09-14 MED ORDER — CLONAZEPAM 0.5 MG PO TABS
0.50 | ORAL_TABLET | ORAL | Status: DC
Start: ? — End: 2018-09-14

## 2018-09-14 MED ORDER — BACLOFEN 10 MG PO TABS
10.00 | ORAL_TABLET | ORAL | Status: DC
Start: 2018-09-14 — End: 2018-09-14

## 2018-09-14 MED ORDER — LIDOCAINE 5 % EX PTCH
2.00 | MEDICATED_PATCH | CUTANEOUS | Status: DC
Start: 2018-09-15 — End: 2018-09-14

## 2018-09-14 MED ORDER — TRAMADOL HCL 50 MG PO TABS
50.00 | ORAL_TABLET | ORAL | Status: DC
Start: ? — End: 2018-09-14

## 2018-09-14 MED ORDER — ENOXAPARIN SODIUM 40 MG/0.4ML ~~LOC~~ SOLN
40.00 | SUBCUTANEOUS | Status: DC
Start: ? — End: 2018-09-14

## 2018-09-14 MED ORDER — DICLOFENAC SODIUM 1 % TD GEL
2.00 | TRANSDERMAL | Status: DC
Start: 2018-09-14 — End: 2018-09-14

## 2018-09-17 ENCOUNTER — Ambulatory Visit (INDEPENDENT_AMBULATORY_CARE_PROVIDER_SITE_OTHER): Payer: BLUE CROSS/BLUE SHIELD | Admitting: Family Medicine

## 2018-09-17 ENCOUNTER — Encounter: Payer: Self-pay | Admitting: Family Medicine

## 2018-09-17 ENCOUNTER — Other Ambulatory Visit: Payer: Self-pay

## 2018-09-17 DIAGNOSIS — G839 Paralytic syndrome, unspecified: Secondary | ICD-10-CM | POA: Diagnosis not present

## 2018-09-17 NOTE — Assessment & Plan Note (Signed)
Discussed with patient proximal motor weakness work-up in progress at [facility] Reviewed patient's blood work and labs

## 2018-09-17 NOTE — Progress Notes (Signed)
There were no vitals taken for this visit.   Subjective:    Patient ID: Harold Tate, male    DOB: 02/17/1966, 53 y.o.   MRN: [id number]  HPI: Harold Tate is a 53 y.o. male  Hospital F/U Telemedicine using audio/video telecommunications for a synchronous communication visit. Today's visit due to COVID-19 isolation precautions I connected with and verified that I am speaking with the correct person using two identifiers.   I discussed the limitations, risks, security and privacy concerns of performing an evaluation and management service by telecommunication and the availability of in person appointments. I also discussed with the patient that there may be a patient responsible charge related to this service. The patient expressed understanding and agreed to proceed. The patient's location is home. I am at home. Transition of Care Hospital Follow up.   Hospital/Facility:[facility] D/C Physician: Ronalee Red D/C Date: 09-14-18  Records Requested: na Records Received: na Records Reviewed: today  Diagnoses on Discharge: proximal motor weekness  Date of interactive Contact within 48 hours of discharge:09-15-18  Contact was through: phone  Date of 7 day or 14 day face-to-face visit:    09-14-18  Outpatient Encounter Medications as of 09/17/2018  Medication Sig  . benazepril (LOTENSIN) 40 MG tablet Take 1 tablet (40 mg total) by mouth daily.  . clonazePAM (KLONOPIN) 0.5 MG tablet Take 1 tablet (0.5 mg total) by mouth 3 (three) times daily as neededfor anxiety.  . cyclobenzaprine (FLEXERIL) 10 MG tablet Take 1 tablet (10 mg total) by mouth 3 (three) times daily as needed for muscle spasms.   No facility-administered encounter medications on file as of 09/17/2018.     Diagnostic Tests Reviewed/Disposition: Proximal motor weekness  Consults:mult at [facility]  Discharge Instructions F/U PT and neuro muscular clinic  Disease/illness Education:done  Home Health/Community Services  Discussions/Referrals:na  Establishment or re-establishment of referral orders for community resources:na  Discussion with other health care providers:na  Assessment and Support of treatment regimen adherence:na  Appointments Coordinated with: na  Education for self-management, independent living, and ADLs: done  Relevant past medical, surgical, family and social history reviewed and updated as indicated. Interim medical history since our last visit reviewed. Allergies and medications reviewed and updated.  Review of Systems  Constitutional: Negative.   Respiratory: Negative.   Cardiovascular: Negative.     Per HPI unless specifically indicated above     Objective:    There were no vitals taken for this visit.  Wt Readings from Last 3 Encounters:  05/05/18 244 lb (110.7 kg)  11/13/17 235 lb 4.8 oz (106.7 kg)  11/08/17 240 lb (108.9 kg)    Physical Exam  Results for orders placed or performed in visit on 06/18/17  Bayer DCA Hb A1c Waived  Result Value Ref Range   HB A1C (BAYER DCA - WAIVED) 5.8 <8.5 %  Basic metabolic panel  Result Value Ref Range   Glucose 106 (H) 65 - 99 mg/dL   BUN 8 6 - 24 mg/dL   Creatinine, Ser 0.72 (L) 0.76 - 1.27 mg/dL   GFR calc non Af Amer 108 >59 mL/min/1.73   GFR calc Af Amer 125 >59 mL/min/1.73   BUN/Creatinine Ratio 11 9 - 20   Sodium 136 134 - 144 mmol/L   Potassium 4.3 3.5 - 5.2 mmol/L   Chloride 97 96 - 106 mmol/L   CO2 23 20 - 29 mmol/L   Calcium 9.1 8.7 - 10.2 mg/dL      Assessment & Plan:  Problem List Items Addressed This Visit      Other   Motor level spinal weakness [facility])    Discussed with patient proximal motor weakness work-up in progress at [facility] Reviewed patient's blood work and labs         I discussed the assessment and treatment plan with the patient. The patient was provided an opportunity to ask questions and all were answered. The patient agreed with the plan and demonstrated an understanding of the  instructions.   The patient was advised to call back or seek an in-person evaluation if the symptoms worsen or if the condition fails to improve as anticipated.   I provided 21+ minutes of time during this encounter.  Follow up plan: Return if symptoms worsen or fail to improve, for As scheduled.

## 2018-09-22 ENCOUNTER — Telehealth: Payer: Self-pay | Admitting: Family Medicine

## 2018-09-22 NOTE — Telephone Encounter (Signed)
Pt called in again stating he would like to speak with Dr. Jeananne Rama. He said that the neurosurgery consult did not go well. He said that her disposition wasn't good. His sister went with him to the appointment. He said the doctor noted in his chart he has opioid addiction and pt has never had a problem. He doesn't want to see her anymore, he doesn't like her disposition, he cannot communicate well with her. The doctor ran labs and they are waiting on results. He states that he is supposed to be getting PT as well. The company [facility] that he was told to call is not in network with his insurance. He needs some assistance. Pt states that he doesn't need a narcotic but has pain. He suggested even prescription ibuprofen. Pt asked me to hold thinking Dr. Jeananne Rama was calling him. Call ended.  Copied from [place] [id number]. Topic: General - Inquiry >> Sep 18, 2018 12:14 PM Mathis Bud wrote: Reason for CRM: patient is calling to request pain medication. Patient states that he spoke with PCP regarding this issues yesterday on virtual appt. 5/21.  Patient call back # [phone]

## 2018-09-23 ENCOUNTER — Other Ambulatory Visit: Payer: Self-pay

## 2018-09-23 ENCOUNTER — Ambulatory Visit (INDEPENDENT_AMBULATORY_CARE_PROVIDER_SITE_OTHER): Payer: BLUE CROSS/BLUE SHIELD | Admitting: Family Medicine

## 2018-09-23 ENCOUNTER — Encounter: Payer: Self-pay | Admitting: Family Medicine

## 2018-09-23 DIAGNOSIS — R29898 Other symptoms and signs involving the musculoskeletal system: Secondary | ICD-10-CM | POA: Insufficient documentation

## 2018-09-23 MED ORDER — IBUPROFEN 600 MG PO TABS: 600.0000 mg | ORAL_TABLET | Freq: Four times a day (QID) | ORAL | 2 refills | Status: DC | PRN

## 2018-09-23 NOTE — Assessment & Plan Note (Signed)
Discussed leg weakness care and treatment will go ahead with physical therapy and MRI.  Discussed follow-up with neurology Discussed pain medication with ibuprofen. Discussed alcohol and concerns.

## 2018-09-23 NOTE — Telephone Encounter (Signed)
Call pt apt this PM with me

## 2018-09-23 NOTE — Progress Notes (Signed)
There were no vitals taken for this visit.   Subjective:    Patient ID: Harold Tate, male    DOB: [birth year], 53 y.o.   MRN: [id number]  HPI: Harold Tate is a 53 y.o. male  Pain check med check  Telemedicine using audio/video telecommunications for a synchronous communication visit. Today's visit due to COVID-19 isolation precautions I connected with and verified that I am speaking with the correct person using two identifiers.   I discussed the limitations, risks, security and privacy concerns of performing an evaluation and management service by telecommunication and the availability of in person appointments. I also discussed with the patient that there may be a patient responsible charge related to this service. The patient expressed understanding and agreed to proceed. The patient's location is home. I am at home.  Discussed extensively with patient that he feels he was not heard at [facility] they focus more on alcohol and drugs which patient has never been a drug seeker or abuser.  Patient has drunk alcohol to excess at times and explained their concerns about DTs at [facility].Marland Kitchen Discussed following up with recommendations of MRI and physical therapy which patient indicates she is going to do.. Discussed pain for medication which ibuprofen 600 mg has worked well.  We will give patient another prescription. Discussed patient's frustrations with proximal motor weakness and his difficulties getting through the day.  Relevant past medical, surgical, family and social history reviewed and updated as indicated. Interim medical history since our last visit reviewed. Allergies and medications reviewed and updated.  Review of Systems  Constitutional: Negative.   Respiratory: Negative.   Cardiovascular: Negative.     Per HPI unless specifically indicated above     Objective:    There were no vitals taken for this visit.  Wt Readings from Last 3 Encounters:  05/05/18 244 lb (110.7 kg)   11/13/17 235 lb 4.8 oz (106.7 kg)  11/08/17 240 lb (108.9 kg)    Physical Exam  Results for orders placed or performed in visit on 06/18/17  Bayer DCA Hb A1c Waived  Result Value Ref Range   HB A1C (BAYER DCA - WAIVED) 5.8 <2.7 %  Basic metabolic panel  Result Value Ref Range   Glucose 106 (H) 65 - 99 mg/dL   BUN 8 6 - 24 mg/dL   Creatinine, Ser 0.72 (L) 0.76 - 1.27 mg/dL   GFR calc non Af Amer 108 >59 mL/min/1.73   GFR calc Af Amer 125 >59 mL/min/1.73   BUN/Creatinine Ratio 11 9 - 20   Sodium 136 134 - 144 mmol/L   Potassium 4.3 3.5 - 5.2 mmol/L   Chloride 97 96 - 106 mmol/L   CO2 23 20 - 29 mmol/L   Calcium 9.1 8.7 - 10.2 mg/dL      Assessment & Plan:   Problem List Items Addressed This Visit      Other   Leg weakness    Discussed leg weakness care and treatment will go ahead with physical therapy and MRI.  Discussed follow-up with neurology Discussed pain medication with ibuprofen. Discussed alcohol and concerns.          I discussed the assessment and treatment plan with the patient. The patient was provided an opportunity to ask questions and all were answered. The patient agreed with the plan and demonstrated an understanding of the instructions.   The patient was advised to call back or seek an in-person evaluation if the symptoms worsen or if  the condition fails to improve as anticipated.   I provided 40+ minutes of time during this encounter. Follow up plan: Return in about 4 weeks (around 10/21/2018), or if symptoms worsen or fail to improve, for re check.

## 2018-09-24 ENCOUNTER — Telehealth: Payer: Self-pay

## 2018-09-24 DIAGNOSIS — R29898 Other symptoms and signs involving the musculoskeletal system: Secondary | ICD-10-CM

## 2018-09-24 NOTE — Telephone Encounter (Signed)
  Dr.Crissman please place a order for home health, so that the referrals department can get a company that will accept his insurance.    [facility] sent a referral after patient was discharged from them, the home health company [facility] does not accept his insurance.  Daughter: Doreatha Lew was speaking with [employer].   Copied from [place] [phone]. Topic: Quick Communication - Home Health Verbal Orders >> Sep 23, 2018  3:18 PM Rainey Pines A wrote: Reine Just from [facility] is requesting a callback at [phone]. He also needs a new home health order from Dr. Jeananne Rama as well as a follow up appointment for patient. Patients insurance was not accepted. >> Sep 23, 2018  4:56 PM Guadalupe Maple, MD wrote: ok

## 2018-09-28 ENCOUNTER — Encounter: Payer: Self-pay | Admitting: Family Medicine

## 2018-09-28 NOTE — Telephone Encounter (Signed)
Done

## 2018-09-30 ENCOUNTER — Telehealth: Payer: Self-pay | Admitting: Family Medicine

## 2018-09-30 DIAGNOSIS — R29898 Other symptoms and signs involving the musculoskeletal system: Secondary | ICD-10-CM

## 2018-09-30 NOTE — Telephone Encounter (Signed)
Copied from [place] [phone]. Topic: Referral - Question >> Sep 28, 2018 12:26 PM Parke Poisson wrote: Reason for CRM: There was a referral placed for home health care for pt.Can you please add services that you want ordered for patient?

## 2018-10-01 ENCOUNTER — Encounter: Payer: Self-pay | Admitting: Family Medicine

## 2018-10-01 ENCOUNTER — Other Ambulatory Visit: Payer: Self-pay

## 2018-10-01 ENCOUNTER — Ambulatory Visit (INDEPENDENT_AMBULATORY_CARE_PROVIDER_SITE_OTHER): Payer: BLUE CROSS/BLUE SHIELD | Admitting: Family Medicine

## 2018-10-01 DIAGNOSIS — M869 Osteomyelitis, unspecified: Secondary | ICD-10-CM | POA: Diagnosis not present

## 2018-10-01 DIAGNOSIS — M164 Bilateral post-traumatic osteoarthritis of hip: Secondary | ICD-10-CM

## 2018-10-01 DIAGNOSIS — M160 Bilateral primary osteoarthritis of hip: Secondary | ICD-10-CM | POA: Insufficient documentation

## 2018-10-01 MED ORDER — TRAMADOL HCL 50 MG PO TABS: 50.0000 mg | ORAL_TABLET | Freq: Two times a day (BID) | ORAL | 1 refills | Status: DC | PRN

## 2018-10-01 NOTE — Assessment & Plan Note (Signed)
Because of worsening condition and ability to work will refer to orthopedics to further evaluate

## 2018-10-01 NOTE — Progress Notes (Signed)
There were no vitals taken for this visit.   Subjective:    Patient ID: Harold Tate, male    DOB: 02-09-1966, 53 y.o.   MRN: [id number]  HPI: Harold Tate is a 53 y.o. male  Hip pain  Telemedicine using audio/video telecommunications for a synchronous communication visit. Today's visit due to COVID-19 isolation precautions I connected with and verified that I am speaking with the correct person using two identifiers.   I discussed the limitations, risks, security and privacy concerns of performing an evaluation and management service by telecommunication and the availability of in person appointments. I also discussed with the patient that there may be a patient responsible charge related to this service. The patient expressed understanding and agreed to proceed. The patient's location is home. I am at home.  Ongoing leg weakness difficulty with walking neurology work-up at [facility] for cauda equina and other neurologic type conditions was negative patient had MRI of his hips 2 days ago which included his femurs and musculature which was normal but hips showed marked degenerative changes with osteonecrosis and marked osteoarthritis changes.  Patient has been unable to work because of leg weakness and difficulty with walking. Has continued problems with his feet and ankles from previous surgeries. Patient's insurance is limited to [facility]  Relevant past medical, surgical, family and social history reviewed and updated as indicated. Interim medical history since our last visit reviewed. Allergies and medications reviewed and updated.  Review of Systems  Constitutional: Negative.   Respiratory: Negative.   Cardiovascular: Negative.     Per HPI unless specifically indicated above     Objective:    There were no vitals taken for this visit.  Wt Readings from Last 3 Encounters:  05/05/18 244 lb (110.7 kg)  11/13/17 235 lb 4.8 oz (106.7 kg)  11/08/17 240 lb (108.9 kg)    Physical  Exam  Results for orders placed or performed in visit on 06/18/17  Bayer DCA Hb A1c Waived  Result Value Ref Range   HB A1C (BAYER DCA - WAIVED) 5.8 <8.6 %  Basic metabolic panel  Result Value Ref Range   Glucose 106 (H) 65 - 99 mg/dL   BUN 8 6 - 24 mg/dL   Creatinine, Ser 0.72 (L) 0.76 - 1.27 mg/dL   GFR calc non Af Amer 108 >59 mL/min/1.73   GFR calc Af Amer 125 >59 mL/min/1.73   BUN/Creatinine Ratio 11 9 - 20   Sodium 136 134 - 144 mmol/L   Potassium 4.3 3.5 - 5.2 mmol/L   Chloride 97 96 - 106 mmol/L   CO2 23 20 - 29 mmol/L   Calcium 9.1 8.7 - 10.2 mg/dL      Assessment & Plan:   Problem List Items Addressed This Visit      Musculoskeletal and Integument   Osteomyelitis (HCC)    Currently not taking any medications      Osteoarthritis of both hips    Because of worsening condition and ability to work will refer to orthopedics to further evaluate      Relevant Medications   traMADol (ULTRAM) 50 MG tablet   Other Relevant Orders   Ambulatory referral to Orthopedic Surgery     I discussed the assessment and treatment plan with the patient. The patient was provided an opportunity to ask questions and all were answered. The patient agreed with the plan and demonstrated an understanding of the instructions.   The patient was advised to call back or  seek an in-person evaluation if the symptoms worsen or if the condition fails to improve as anticipated.   I provided 21+ minutes of time during this encounter.  Follow up plan: Return in about 2 weeks (around 10/15/2018).

## 2018-10-01 NOTE — Assessment & Plan Note (Signed)
Currently not taking any medications

## 2018-10-02 ENCOUNTER — Telehealth: Payer: Self-pay

## 2018-10-02 NOTE — Telephone Encounter (Signed)
PA for Tramadol initiated and submitted on Cover My Meds. Key: Jossie Ng

## 2018-10-07 NOTE — Telephone Encounter (Signed)
PA for tramadol denied by patient's insurance.

## 2018-10-08 ENCOUNTER — Other Ambulatory Visit: Payer: Self-pay | Admitting: Family Medicine

## 2018-10-08 DIAGNOSIS — M164 Bilateral post-traumatic osteoarthritis of hip: Secondary | ICD-10-CM

## 2018-10-08 NOTE — Progress Notes (Signed)
Phone call Discussed with patient tramadol patient did get a prescription for that Patient has not heard from orthopedic referral put in second request at this time made referral urgent.

## 2018-10-15 ENCOUNTER — Encounter: Payer: Self-pay | Admitting: Family Medicine

## 2018-10-20 ENCOUNTER — Telehealth: Payer: Self-pay | Admitting: Family Medicine

## 2018-10-20 NOTE — Telephone Encounter (Signed)
Kim from [employer] is calling and stating Pt wants to put nursing visit on hold until he has his surgery.

## 2018-10-22 NOTE — Telephone Encounter (Signed)
That is fine. Thanks. Left for PCP as Juluis Rainier

## 2018-10-26 ENCOUNTER — Ambulatory Visit (INDEPENDENT_AMBULATORY_CARE_PROVIDER_SITE_OTHER): Payer: BLUE CROSS/BLUE SHIELD | Admitting: Family Medicine

## 2018-10-26 ENCOUNTER — Encounter: Payer: Self-pay | Admitting: Family Medicine

## 2018-10-26 ENCOUNTER — Other Ambulatory Visit: Payer: Self-pay

## 2018-10-26 DIAGNOSIS — M164 Bilateral post-traumatic osteoarthritis of hip: Secondary | ICD-10-CM

## 2018-10-26 DIAGNOSIS — E7800 Pure hypercholesterolemia, unspecified: Secondary | ICD-10-CM | POA: Diagnosis not present

## 2018-10-26 DIAGNOSIS — I10 Essential (primary) hypertension: Secondary | ICD-10-CM | POA: Diagnosis not present

## 2018-10-26 NOTE — Assessment & Plan Note (Signed)
Right hip replacement surgery scheduled in 3 days

## 2018-10-26 NOTE — Assessment & Plan Note (Signed)
The current medical regimen is effective;  continue present plan and medications.  

## 2018-10-26 NOTE — Progress Notes (Signed)
   There were no vitals taken for this visit.   Subjective:    Patient ID: Harold Tate, male    DOB: 03-Feb-1966, 53 y.o.   MRN: [id number]  HPI: Harold Tate is a 53 y.o. male  Med check   Has hip replacement. Discussed with patient has hip replacement surgery scheduled later this week.  Reviewed procedure in details with patient need for physical therapy afterwards because of leg weakness and difficulty with walking. Reviewed other medical problems blood pressure and blood pressure medications. Reviewed anxiety nerves pain control. Relevant past medical, surgical, family and social history reviewed and updated as indicated. Interim medical history since our last visit reviewed. Allergies and medications reviewed and updated.  Review of Systems  Constitutional: Negative.   Respiratory: Negative.   Cardiovascular: Negative.     Per HPI unless specifically indicated above     Objective:    There were no vitals taken for this visit.  Wt Readings from Last 3 Encounters:  05/05/18 244 lb (110.7 kg)  11/13/17 235 lb 4.8 oz (106.7 kg)  11/08/17 240 lb (108.9 kg)    Physical Exam  Results for orders placed or performed in visit on 06/18/17  Bayer DCA Hb A1c Waived  Result Value Ref Range   HB A1C (BAYER DCA - WAIVED) 5.8 <4.5 %  Basic metabolic panel  Result Value Ref Range   Glucose 106 (H) 65 - 99 mg/dL   BUN 8 6 - 24 mg/dL   Creatinine, Ser 0.72 (L) 0.76 - 1.27 mg/dL   GFR calc non Af Amer 108 >59 mL/min/1.73   GFR calc Af Amer 125 >59 mL/min/1.73   BUN/Creatinine Ratio 11 9 - 20   Sodium 136 134 - 144 mmol/L   Potassium 4.3 3.5 - 5.2 mmol/L   Chloride 97 96 - 106 mmol/L   CO2 23 20 - 29 mmol/L   Calcium 9.1 8.7 - 10.2 mg/dL      Assessment & Plan:   Problem List Items Addressed This Visit      Cardiovascular and Mediastinum   Benign hypertension (Chronic)    The current medical regimen is effective;  continue present plan and medications.         Musculoskeletal and Integument   Osteoarthritis of both hips    Right hip replacement surgery scheduled in 3 days        Other   Hypercholesterolemia    The current medical regimen is effective;  continue present plan and medications.           Follow up plan: Return if symptoms worsen or fail to improve.

## 2018-10-27 ENCOUNTER — Other Ambulatory Visit: Payer: Self-pay

## 2018-10-27 ENCOUNTER — Encounter
Admission: RE | Admit: 2018-10-27 | Discharge: 2018-10-27 | Disposition: A | Discharge status: Home / Self Care | Payer: BLUE CROSS/BLUE SHIELD | Source: Ambulatory Visit | Attending: Orthopedic Surgery | Admitting: Orthopedic Surgery

## 2018-10-27 DIAGNOSIS — Z01818 Encounter for other preprocedural examination: Secondary | ICD-10-CM | POA: Insufficient documentation

## 2018-10-27 HISTORY — DX: Sleep apnea, unspecified: G47.30

## 2018-10-27 HISTORY — DX: Dyspnea, unspecified: R06.00

## 2018-10-27 HISTORY — DX: Unspecified osteoarthritis, unspecified site: M19.90

## 2018-10-27 HISTORY — DX: Pain in unspecified hip: M25.559

## 2018-10-27 LAB — BASIC METABOLIC PANEL
Anion gap: 9 (ref 5–15)
BUN: 7 mg/dL (ref 6–20)
CO2: 25 mmol/L (ref 22–32)
Calcium: 8.9 mg/dL (ref 8.9–10.3)
Chloride: 99 mmol/L (ref 98–111)
Creatinine, Ser: 0.61 mg/dL (ref 0.61–1.24)
GFR calc Af Amer: >60 mL/min (ref >60)
GFR calc non Af Amer: >60 mL/min (ref >60)
Glucose, Bld: 164 mg/dL — ABNORMAL HIGH (ref 70–99)
Potassium: 4.5 mmol/L (ref 3.5–5.1)
Sodium: 133 mmol/L — ABNORMAL LOW (ref 135–145)

## 2018-10-27 LAB — APTT: aPTT: 29 seconds (ref 24–36)

## 2018-10-27 LAB — CBC
HCT: 44.7 % (ref 39.0–52.0)
Hemoglobin: 15.7 g/dL (ref 13.0–17.0)
MCH: 34.4 pg — ABNORMAL HIGH (ref 26.0–34.0)
MCHC: 35.1 g/dL (ref 30.0–36.0)
MCV: 98 fL (ref 80.0–100.0)
Platelets: 191 10*3/uL (ref 150–400)
RBC: 4.56 MIL/uL (ref 4.22–5.81)
RDW: 13.1 % (ref 11.5–15.5)
WBC: 6.1 10*3/uL (ref 4.0–10.5)
nRBC: 0 % (ref 0.0–0.2)

## 2018-10-27 LAB — TYPE AND SCREEN
ABO/RH(D): O POS
Antibody Screen: NEGATIVE

## 2018-10-27 LAB — URINALYSIS, COMPLETE (UACMP) WITH MICROSCOPIC
Bacteria, UA: NONE SEEN
Bilirubin Urine: NEGATIVE
Glucose, UA: NEGATIVE mg/dL
Hgb urine dipstick: NEGATIVE
Ketones, ur: NEGATIVE mg/dL
Leukocytes,Ua: NEGATIVE
Nitrite: NEGATIVE
Protein, ur: NEGATIVE mg/dL
Specific Gravity, Urine: 1.016 (ref 1.005–1.030)
pH: 7 (ref 5.0–8.0)

## 2018-10-27 LAB — SEDIMENTATION RATE: Sed Rate: 11 mm/hr (ref 0–20)

## 2018-10-27 LAB — PROTIME-INR
INR: 1 (ref 0.8–1.2)
Prothrombin Time: 13.1 seconds (ref 11.4–15.2)

## 2018-10-27 LAB — SURGICAL PCR SCREEN
MRSA, PCR: NEGATIVE
Staphylococcus aureus: NEGATIVE

## 2018-10-27 NOTE — Patient Instructions (Signed)
Your procedure is scheduled on: 11/03/2018 Tues Report to Same Day Surgery 2nd floor medical mall [facility] Entrance-take elevator on left to 2nd floor.  Check in with surgery information desk.) To find out your arrival time please call [phone] between 1PM - 3PM on 11/02/2018 Mon  Remember: Instructions that are not followed completely may result in serious medical risk, up to and including death, or upon the discretion of your surgeon and anesthesiologist your surgery may need to be rescheduled.    _x___ 1. Do not eat food after midnight the night before your procedure. You may drink clear liquids up to 2 hours before you are scheduled to arrive at the hospital for your procedure.  Do not drink clear liquids within 2 hours of your scheduled arrival to the hospital.  Clear liquids include  --Water or Apple juice without pulp  --Clear carbohydrate beverage such as ClearFast or Gatorade  --Black Coffee or Clear Tea (No milk, no creamers, do not add anything to                  the coffee or Tea Type 1 and type 2 diabetics should only drink water.   ____Ensure clear carbohydrate drink on the way to the hospital for bariatric patients  ____Ensure clear carbohydrate drink 3 hours before surgery for Dr Dwyane Luo patients if physician instructed.   No gum chewing or hard candies.     __x__ 2. No Alcohol for 24 hours before or after surgery.   __x__3. No Smoking or e-cigarettes for 24 prior to surgery.  Do not use any chewable tobacco products for at least 6 hour prior to surgery   ____  4. Bring all medications with you on the day of surgery if instructed.    __x__ 5. Notify your doctor if there is any change in your medical condition     (cold, fever, infections).    x___6. On the morning of surgery brush your teeth with toothpaste and water.  You may rinse your mouth with mouth wash if you wish.  Do not swallow any toothpaste or mouthwash.   Do not wear jewelry, make-up, hairpins,  clips or nail polish.  Do not wear lotions, powders, or perfumes. You may wear deodorant.  Do not shave 48 hours prior to surgery. Men may shave face and neck.  Do not bring valuables to the hospital.    [facility] is not responsible for any belongings or valuables.               Contacts, dentures or bridgework may not be worn into surgery.  Leave your suitcase in the car. After surgery it may be brought to your room.  For patients admitted to the hospital, discharge time is determined by your                       treatment team.  _  Patients discharged the day of surgery will not be allowed to drive home.  You will need someone to drive you home and stay with you the night of your procedure.    Please read over the following fact sheets that you were given:   [facility] Preparing for Surgery and or MRSA Information   _x___ Take anti-hypertensive listed below, cardiac, seizure, asthma,     anti-reflux and psychiatric medicines. These include:  1. clonazePAM (KLONOPIN) 0.5 MG tablet  2.traMADol (ULTRAM) 50 MG tablet if needed  3.  4.  5.  6.  ____Fleets enema or Magnesium Citrate as directed.   _x___ Use CHG Soap or sage wipes as directed on instruction sheet   ____ Use inhalers on the day of surgery and bring to hospital day of surgery  ____ Stop Metformin and Janumet 2 days prior to surgery.    ____ Take 1/2 of usual insulin dose the night before surgery and none on the morning     surgery.   _x___ Follow recommendations from Cardiologist, Pulmonologist or PCP regarding          stopping Aspirin, Coumadin, Plavix ,Eliquis, Effient, or Pradaxa, and Pletal.  X____Stop Anti-inflammatories such as Advil, Aleve, Ibuprofen, Motrin, Naproxen, Naprosyn, Goodies powders or aspirin products. OK to take Tylenol and                          Celebrex.   _x___ Stop supplements until after surgery.  But may continue Vitamin D, Vitamin B,       and multivitamin.   ____ Bring C-Pap to  the hospital.

## 2018-10-28 LAB — URINE CULTURE: Culture: NO GROWTH

## 2018-10-30 ENCOUNTER — Other Ambulatory Visit
Admission: RE | Admit: 2018-10-30 | Discharge: 2018-10-30 | Disposition: A | Discharge status: Home / Self Care | Payer: BLUE CROSS/BLUE SHIELD | Source: Ambulatory Visit | Attending: Orthopedic Surgery | Admitting: Orthopedic Surgery

## 2018-10-30 ENCOUNTER — Other Ambulatory Visit: Payer: Self-pay

## 2018-10-30 DIAGNOSIS — Z01812 Encounter for preprocedural laboratory examination: Secondary | ICD-10-CM | POA: Insufficient documentation

## 2018-10-30 DIAGNOSIS — Z1159 Encounter for screening for other viral diseases: Secondary | ICD-10-CM | POA: Diagnosis not present

## 2018-10-30 LAB — SARS CORONAVIRUS 2 (TAT 6-24 HRS): SARS Coronavirus 2: NEGATIVE

## 2018-11-02 ENCOUNTER — Telehealth: Payer: Self-pay

## 2018-11-02 MED ORDER — CEFAZOLIN SODIUM-DEXTROSE 2-4 GM/100ML-% IV SOLN
2.0000 g | Freq: Once | INTRAVENOUS | Status: AC
  Administered 2018-11-03: 2 g via INTRAVENOUS

## 2018-11-03 ENCOUNTER — Other Ambulatory Visit: Payer: Self-pay

## 2018-11-03 ENCOUNTER — Encounter: Payer: Self-pay | Admitting: *Deleted

## 2018-11-03 ENCOUNTER — Encounter
Admission: RE | Disposition: A | Discharge status: Home Health | Payer: Self-pay | Source: Home / Self Care | Attending: Orthopedic Surgery

## 2018-11-03 ENCOUNTER — Inpatient Hospital Stay: Payer: BLUE CROSS/BLUE SHIELD | Admitting: Anesthesiology

## 2018-11-03 ENCOUNTER — Inpatient Hospital Stay
Admission: RE | Admit: 2018-11-03 | Discharge: 2018-11-06 | DRG: 470 | Disposition: A | Discharge status: Home Health | Payer: BLUE CROSS/BLUE SHIELD | Attending: Orthopedic Surgery | Admitting: Orthopedic Surgery

## 2018-11-03 ENCOUNTER — Inpatient Hospital Stay: Payer: BLUE CROSS/BLUE SHIELD

## 2018-11-03 DIAGNOSIS — E119 Type 2 diabetes mellitus without complications: Secondary | ICD-10-CM | POA: Diagnosis present

## 2018-11-03 DIAGNOSIS — T148XXA Other injury of unspecified body region, initial encounter: Secondary | ICD-10-CM

## 2018-11-03 DIAGNOSIS — I7300 Raynaud's syndrome without gangrene: Secondary | ICD-10-CM | POA: Diagnosis present

## 2018-11-03 DIAGNOSIS — F419 Anxiety disorder, unspecified: Secondary | ICD-10-CM | POA: Diagnosis present

## 2018-11-03 DIAGNOSIS — M87851 Other osteonecrosis, right femur: Secondary | ICD-10-CM | POA: Diagnosis present

## 2018-11-03 DIAGNOSIS — Z79899 Other long term (current) drug therapy: Secondary | ICD-10-CM

## 2018-11-03 DIAGNOSIS — Z1159 Encounter for screening for other viral diseases: Secondary | ICD-10-CM | POA: Diagnosis not present

## 2018-11-03 DIAGNOSIS — M1611 Unilateral primary osteoarthritis, right hip: Secondary | ICD-10-CM | POA: Diagnosis present

## 2018-11-03 DIAGNOSIS — I10 Essential (primary) hypertension: Secondary | ICD-10-CM | POA: Diagnosis present

## 2018-11-03 DIAGNOSIS — Z79891 Long term (current) use of opiate analgesic: Secondary | ICD-10-CM | POA: Diagnosis not present

## 2018-11-03 DIAGNOSIS — G8918 Other acute postprocedural pain: Secondary | ICD-10-CM

## 2018-11-03 DIAGNOSIS — Z96641 Presence of right artificial hip joint: Secondary | ICD-10-CM

## 2018-11-03 HISTORY — PX: TOTAL HIP ARTHROPLASTY: SHX124

## 2018-11-03 LAB — CBC
HCT: 40.5 % (ref 39.0–52.0)
Hemoglobin: 13.8 g/dL (ref 13.0–17.0)
MCH: 34.2 pg — ABNORMAL HIGH (ref 26.0–34.0)
MCHC: 34.1 g/dL (ref 30.0–36.0)
MCV: 100.2 fL — ABNORMAL HIGH (ref 80.0–100.0)
Platelets: 162 10*3/uL (ref 150–400)
RBC: 4.04 MIL/uL — ABNORMAL LOW (ref 4.22–5.81)
RDW: 13.1 % (ref 11.5–15.5)
WBC: 9.3 10*3/uL (ref 4.0–10.5)
nRBC: 0 % (ref 0.0–0.2)

## 2018-11-03 LAB — CREATININE, SERUM
Creatinine, Ser: 0.72 mg/dL (ref 0.61–1.24)
GFR calc Af Amer: >60 mL/min (ref >60)
GFR calc non Af Amer: >60 mL/min (ref >60)

## 2018-11-03 LAB — ABO/RH: ABO/RH(D): O POS

## 2018-11-03 SURGERY — ARTHROPLASTY, HIP, TOTAL, ANTERIOR APPROACH
Anesthesia: Spinal | Site: Hip | Laterality: Right

## 2018-11-03 MED ORDER — LORAZEPAM 2 MG PO TABS
2.0000 mg | ORAL_TABLET | Freq: Two times a day (BID) | ORAL | Status: DC
  Administered 2018-11-03 – 2018-11-06 (×6): 2 mg via ORAL
  Filled 2018-11-03 (×6): qty 1

## 2018-11-03 MED ORDER — BISACODYL 5 MG PO TBEC: 5.0000 mg | DELAYED_RELEASE_TABLET | Freq: Every day | ORAL | Status: DC | PRN

## 2018-11-03 MED ORDER — PROPOFOL 10 MG/ML IV BOLUS
INTRAVENOUS | Status: AC
  Filled 2018-11-03: qty 60

## 2018-11-03 MED ORDER — BUPIVACAINE LIPOSOME 1.3 % IJ SUSP
INTRAMUSCULAR | Status: AC
  Filled 2018-11-03: qty 20

## 2018-11-03 MED ORDER — NEOMYCIN-POLYMYXIN B GU 40-200000 IR SOLN
Status: DC | PRN
  Administered 2018-11-03: 4 mL

## 2018-11-03 MED ORDER — METOCLOPRAMIDE HCL 5 MG/ML IJ SOLN: 5.0000 mg | Freq: Three times a day (TID) | INTRAMUSCULAR | Status: DC | PRN

## 2018-11-03 MED ORDER — FAMOTIDINE 20 MG PO TABS
ORAL_TABLET | ORAL | Status: AC
  Administered 2018-11-03: 20 mg via ORAL
  Filled 2018-11-03: qty 1

## 2018-11-03 MED ORDER — BENAZEPRIL HCL 20 MG PO TABS
40.0000 mg | ORAL_TABLET | Freq: Every day | ORAL | Status: DC
  Administered 2018-11-04 – 2018-11-06 (×3): 40 mg via ORAL
  Filled 2018-11-03 (×4): qty 2

## 2018-11-03 MED ORDER — PHENOL 1.4 % MT LIQD
1.0000 | OROMUCOSAL | Status: DC | PRN
  Filled 2018-11-03: qty 177

## 2018-11-03 MED ORDER — MIDAZOLAM HCL 2 MG/2ML IJ SOLN
INTRAMUSCULAR | Status: AC
  Filled 2018-11-03: qty 2

## 2018-11-03 MED ORDER — BUPIVACAINE-EPINEPHRINE (PF) 0.25% -1:200000 IJ SOLN
INTRAMUSCULAR | Status: AC
  Filled 2018-11-03: qty 30

## 2018-11-03 MED ORDER — MAGNESIUM HYDROXIDE 400 MG/5ML PO SUSP: 30.0000 mL | Freq: Every day | ORAL | Status: DC | PRN

## 2018-11-03 MED ORDER — OXYCODONE HCL 5 MG/5ML PO SOLN: 5.0000 mg | Freq: Once | ORAL | Status: DC | PRN

## 2018-11-03 MED ORDER — PROPOFOL 10 MG/ML IV BOLUS
INTRAVENOUS | Status: DC | PRN
  Administered 2018-11-03: 80 mg via INTRAVENOUS

## 2018-11-03 MED ORDER — ALUM & MAG HYDROXIDE-SIMETH 200-200-20 MG/5ML PO SUSP
30.0000 mL | ORAL | Status: DC | PRN
  Administered 2018-11-05 – 2018-11-06 (×2): 30 mL via ORAL
  Filled 2018-11-03 (×2): qty 30

## 2018-11-03 MED ORDER — CLONAZEPAM 0.5 MG PO TABS
0.5000 mg | ORAL_TABLET | Freq: Three times a day (TID) | ORAL | Status: DC | PRN
  Administered 2018-11-03: 0.5 mg via ORAL
  Filled 2018-11-03 (×2): qty 1

## 2018-11-03 MED ORDER — OXYCODONE HCL 5 MG PO TABS
10.0000 mg | ORAL_TABLET | ORAL | Status: DC | PRN
  Administered 2018-11-04 – 2018-11-06 (×2): 10 mg via ORAL
  Filled 2018-11-03 (×2): qty 2

## 2018-11-03 MED ORDER — SODIUM CHLORIDE FLUSH 0.9 % IV SOLN
INTRAVENOUS | Status: AC
  Filled 2018-11-03: qty 10

## 2018-11-03 MED ORDER — LACTATED RINGERS IV SOLN
INTRAVENOUS | Status: DC
  Administered 2018-11-03 (×2): via INTRAVENOUS

## 2018-11-03 MED ORDER — TRAMADOL HCL 50 MG PO TABS
50.0000 mg | ORAL_TABLET | Freq: Four times a day (QID) | ORAL | Status: DC
  Administered 2018-11-03 – 2018-11-06 (×11): 50 mg via ORAL
  Filled 2018-11-03 (×11): qty 1

## 2018-11-03 MED ORDER — SODIUM CHLORIDE 0.9 % IV SOLN
INTRAVENOUS | Status: DC | PRN
  Administered 2018-11-03: 20 ug/min via INTRAVENOUS

## 2018-11-03 MED ORDER — MENTHOL 3 MG MT LOZG
1.0000 | LOZENGE | OROMUCOSAL | Status: DC | PRN
  Filled 2018-11-03: qty 9

## 2018-11-03 MED ORDER — ACETAMINOPHEN 500 MG PO TABS
1000.0000 mg | ORAL_TABLET | Freq: Four times a day (QID) | ORAL | Status: AC
  Administered 2018-11-03 – 2018-11-04 (×4): 1000 mg via ORAL
  Filled 2018-11-03 (×5): qty 2

## 2018-11-03 MED ORDER — PROPOFOL 500 MG/50ML IV EMUL
INTRAVENOUS | Status: DC | PRN
  Administered 2018-11-03: 175 ug/kg/min via INTRAVENOUS

## 2018-11-03 MED ORDER — PHENYLEPHRINE HCL (PRESSORS) 10 MG/ML IV SOLN
INTRAVENOUS | Status: DC | PRN
  Administered 2018-11-03: 20 ug via INTRAVENOUS

## 2018-11-03 MED ORDER — SODIUM CHLORIDE 0.9 % IV SOLN
INTRAVENOUS | Status: DC
  Administered 2018-11-03 – 2018-11-04 (×2): via INTRAVENOUS

## 2018-11-03 MED ORDER — ONDANSETRON HCL 4 MG/2ML IJ SOLN: 4.0000 mg | Freq: Four times a day (QID) | INTRAMUSCULAR | Status: DC | PRN

## 2018-11-03 MED ORDER — SODIUM CHLORIDE 0.9 % IV SOLN
INTRAVENOUS | Status: DC | PRN
  Administered 2018-11-03: 80 mL

## 2018-11-03 MED ORDER — METOCLOPRAMIDE HCL 10 MG PO TABS: 5.0000 mg | ORAL_TABLET | Freq: Three times a day (TID) | ORAL | Status: DC | PRN

## 2018-11-03 MED ORDER — ZOLPIDEM TARTRATE 5 MG PO TABS: 5.0000 mg | ORAL_TABLET | Freq: Every evening | ORAL | Status: DC | PRN

## 2018-11-03 MED ORDER — FOLIC ACID 1 MG PO TABS
1.0000 mg | ORAL_TABLET | Freq: Every day | ORAL | Status: DC
  Administered 2018-11-03 – 2018-11-06 (×4): 1 mg via ORAL
  Filled 2018-11-03 (×5): qty 1

## 2018-11-03 MED ORDER — DOCUSATE SODIUM 100 MG PO CAPS
100.0000 mg | ORAL_CAPSULE | Freq: Two times a day (BID) | ORAL | Status: DC
  Administered 2018-11-03 – 2018-11-06 (×6): 100 mg via ORAL
  Filled 2018-11-03 (×7): qty 1

## 2018-11-03 MED ORDER — BUPIVACAINE-EPINEPHRINE 0.25% -1:200000 IJ SOLN
INTRAMUSCULAR | Status: DC | PRN
  Administered 2018-11-03: 30 mL

## 2018-11-03 MED ORDER — DIPHENHYDRAMINE HCL 12.5 MG/5ML PO ELIX: 12.5000 mg | ORAL_SOLUTION | ORAL | Status: DC | PRN

## 2018-11-03 MED ORDER — CEFAZOLIN SODIUM-DEXTROSE 2-4 GM/100ML-% IV SOLN
INTRAVENOUS | Status: AC
  Filled 2018-11-03: qty 100

## 2018-11-03 MED ORDER — OXYCODONE HCL 5 MG PO TABS
5.0000 mg | ORAL_TABLET | ORAL | Status: DC | PRN
  Administered 2018-11-03 (×2): 5 mg via ORAL
  Administered 2018-11-03 – 2018-11-06 (×10): 10 mg via ORAL
  Filled 2018-11-03: qty 2
  Filled 2018-11-03: qty 1
  Filled 2018-11-03 (×5): qty 2
  Filled 2018-11-03: qty 1
  Filled 2018-11-03 (×5): qty 2

## 2018-11-03 MED ORDER — CEFAZOLIN SODIUM-DEXTROSE 1-4 GM/50ML-% IV SOLN
1.0000 g | Freq: Four times a day (QID) | INTRAVENOUS | Status: AC
  Administered 2018-11-03 (×2): 1 g via INTRAVENOUS
  Filled 2018-11-03 (×2): qty 50

## 2018-11-03 MED ORDER — METHOCARBAMOL 1000 MG/10ML IJ SOLN
500.0000 mg | Freq: Four times a day (QID) | INTRAVENOUS | Status: DC | PRN
  Filled 2018-11-03: qty 5

## 2018-11-03 MED ORDER — METHOCARBAMOL 500 MG PO TABS
500.0000 mg | ORAL_TABLET | Freq: Four times a day (QID) | ORAL | Status: DC | PRN
  Administered 2018-11-03 – 2018-11-04 (×4): 500 mg via ORAL
  Filled 2018-11-03 (×5): qty 1

## 2018-11-03 MED ORDER — MAGNESIUM CITRATE PO SOLN
1.0000 | Freq: Once | ORAL | Status: DC | PRN
  Filled 2018-11-03: qty 296

## 2018-11-03 MED ORDER — VITAMIN B-1 100 MG PO TABS
100.0000 mg | ORAL_TABLET | Freq: Every day | ORAL | Status: DC
  Administered 2018-11-03 – 2018-11-06 (×4): 100 mg via ORAL
  Filled 2018-11-03 (×4): qty 1

## 2018-11-03 MED ORDER — ENOXAPARIN SODIUM 40 MG/0.4ML ~~LOC~~ SOLN
40.0000 mg | SUBCUTANEOUS | Status: DC
  Administered 2018-11-04 – 2018-11-06 (×3): 40 mg via SUBCUTANEOUS
  Filled 2018-11-03 (×3): qty 0.4

## 2018-11-03 MED ORDER — HYDROMORPHONE HCL 1 MG/ML IJ SOLN
0.5000 mg | INTRAMUSCULAR | Status: DC | PRN
  Administered 2018-11-04: 1 mg via INTRAVENOUS
  Filled 2018-11-03: qty 1

## 2018-11-03 MED ORDER — MIDAZOLAM HCL 5 MG/5ML IJ SOLN
INTRAMUSCULAR | Status: DC | PRN
  Administered 2018-11-03 (×2): 2 mg via INTRAVENOUS

## 2018-11-03 MED ORDER — FENTANYL CITRATE (PF) 100 MCG/2ML IJ SOLN: 25.0000 ug | INTRAMUSCULAR | Status: DC | PRN

## 2018-11-03 MED ORDER — ONDANSETRON HCL 4 MG PO TABS: 4.0000 mg | ORAL_TABLET | Freq: Four times a day (QID) | ORAL | Status: DC | PRN

## 2018-11-03 MED ORDER — ACETAMINOPHEN 325 MG PO TABS: 325.0000 mg | ORAL_TABLET | Freq: Four times a day (QID) | ORAL | Status: DC | PRN

## 2018-11-03 MED ORDER — SODIUM CHLORIDE (PF) 0.9 % IJ SOLN
INTRAMUSCULAR | Status: AC
  Filled 2018-11-03: qty 50

## 2018-11-03 MED ORDER — FAMOTIDINE 20 MG PO TABS
20.0000 mg | ORAL_TABLET | Freq: Once | ORAL | Status: AC
  Administered 2018-11-03: 08:00:00 20 mg via ORAL

## 2018-11-03 MED ORDER — SODIUM CHLORIDE 0.9 % IJ SOLN: INTRAMUSCULAR | Status: DC | PRN

## 2018-11-03 MED ORDER — OXYCODONE HCL 5 MG PO TABS: 5.0000 mg | ORAL_TABLET | Freq: Once | ORAL | Status: DC | PRN

## 2018-11-03 SURGICAL SUPPLY — 61 items
BLADE SAGITTAL AGGR TOOTH XLG (BLADE) ×2 IMPLANT
BNDG COHESIVE 6X5 TAN STRL LF (GAUZE/BANDAGES/DRESSINGS) ×6 IMPLANT
CANISTER SUCT 1200ML W/VALVE (MISCELLANEOUS) ×2 IMPLANT
CANISTER WOUND CARE 500ML ATS (WOUND CARE) ×2 IMPLANT
CHLORAPREP W/TINT 26 (MISCELLANEOUS) ×2 IMPLANT
COVER WAND RF STERILE (DRAPES) ×2 IMPLANT
DRAPE C-ARM XRAY 36X54 (DRAPES) ×2 IMPLANT
DRAPE INCISE IOBAN 66X60 STRL (DRAPES) IMPLANT
DRAPE POUCH INSTRU U-SHP 10X18 (DRAPES) ×2 IMPLANT
DRAPE SHEET LG 3/4 BI-LAMINATE (DRAPES) ×6 IMPLANT
DRAPE TABLE BACK 80X90 (DRAPES) ×2 IMPLANT
DRESSING SURGICEL FIBRLLR 1X2 (HEMOSTASIS) ×2 IMPLANT
DRSG OPSITE POSTOP 4X8 (GAUZE/BANDAGES/DRESSINGS) ×4 IMPLANT
DRSG SURGICEL FIBRILLAR 1X2 (HEMOSTASIS) ×4
ELECT BLADE 6.5 EXT (BLADE) ×2 IMPLANT
ELECT REM PT RETURN 9FT ADLT (ELECTROSURGICAL) ×2
ELECTRODE REM PT RTRN 9FT ADLT (ELECTROSURGICAL) ×1 IMPLANT
GLOVE BIOGEL PI IND STRL 9 (GLOVE) ×1 IMPLANT
GLOVE BIOGEL PI INDICATOR 9 (GLOVE) ×1
GLOVE SURG SYN 9.0  PF PI (GLOVE) ×2
GLOVE SURG SYN 9.0 PF PI (GLOVE) ×2 IMPLANT
GOWN SRG 2XL LVL 4 RGLN SLV (GOWNS) ×1 IMPLANT
GOWN STRL NON-REIN 2XL LVL4 (GOWNS) ×1
GOWN STRL REUS W/ TWL LRG LVL3 (GOWN DISPOSABLE) ×1 IMPLANT
GOWN STRL REUS W/TWL LRG LVL3 (GOWN DISPOSABLE) ×1
HEMOVAC 400CC 10FR (MISCELLANEOUS) IMPLANT
HIP FEM HD XL 28 (Head) ×1 IMPLANT
HOLDER FOLEY CATH W/STRAP (MISCELLANEOUS) ×2 IMPLANT
HOOD PEEL AWAY FLYTE STAYCOOL (MISCELLANEOUS) ×2 IMPLANT
KIT PREVENA INCISION MGT 13 (CANNISTER) ×2 IMPLANT
LINER DM 28MM (Liner) ×2 IMPLANT
LINER DM SZH 28X56 (Liner) IMPLANT
MAT ABSORB  FLUID 56X50 GRAY (MISCELLANEOUS) ×1
MAT ABSORB FLUID 56X50 GRAY (MISCELLANEOUS) ×1 IMPLANT
NDL SAFETY ECLIPSE 18X1.5 (NEEDLE) ×1 IMPLANT
NDL SPNL 20GX3.5 QUINCKE YW (NEEDLE) ×2 IMPLANT
NEEDLE HYPO 18GX1.5 SHARP (NEEDLE) ×1
NEEDLE SPNL 20GX3.5 QUINCKE YW (NEEDLE) ×4 IMPLANT
NS IRRIG 1000ML POUR BTL (IV SOLUTION) ×2 IMPLANT
PACK HIP COMPR (MISCELLANEOUS) ×2 IMPLANT
SCALPEL PROTECTED #10 DISP (BLADE) ×4 IMPLANT
SHELL ACETABULAR DM  56MM (Shell) ×1 IMPLANT
SOL PREP PVP 2OZ (MISCELLANEOUS) ×2
SOLUTION PREP PVP 2OZ (MISCELLANEOUS) ×1 IMPLANT
SPONGE DRAIN TRACH 4X4 STRL 2S (GAUZE/BANDAGES/DRESSINGS) ×2 IMPLANT
STAPLER SKIN PROX 35W (STAPLE) ×2 IMPLANT
STEM FEMORAL SZ5 STD COLLARED (Stem) ×1 IMPLANT
STRAP SAFETY 5IN WIDE (MISCELLANEOUS) ×2 IMPLANT
SUT DVC 2 QUILL PDO  T11 36X36 (SUTURE) ×1
SUT DVC 2 QUILL PDO T11 36X36 (SUTURE) ×1 IMPLANT
SUT SILK 0 (SUTURE) ×1
SUT SILK 0 30XBRD TIE 6 (SUTURE) ×1 IMPLANT
SUT V-LOC 90 ABS DVC 3-0 CL (SUTURE) ×2 IMPLANT
SUT VIC AB 1 CT1 36 (SUTURE) ×2 IMPLANT
SYR 20CC LL (SYRINGE) ×2 IMPLANT
SYR 30ML LL (SYRINGE) ×2 IMPLANT
SYR 50ML LL SCALE MARK (SYRINGE) ×4 IMPLANT
SYR BULB IRRIG 60ML STRL (SYRINGE) ×2 IMPLANT
TAPE MICROFOAM 4IN (TAPE) ×2 IMPLANT
TOWEL OR 17X26 4PK STRL BLUE (TOWEL DISPOSABLE) ×2 IMPLANT
TRAY FOLEY MTR SLVR 16FR STAT (SET/KITS/TRAYS/PACK) ×2 IMPLANT

## 2018-11-03 NOTE — Anesthesia Post-op Follow-up Note (Signed)
Anesthesia QCDR form completed.        

## 2018-11-03 NOTE — H&P (Signed)
Reviewed paper H+P, will be scanned into chart. No changes noted.  

## 2018-11-03 NOTE — Op Note (Signed)
11/03/2018  11:46 AM  PATIENT:  Harold Tate  53 y.o. male  PRE-OPERATIVE DIAGNOSIS:  AVASCULAR NECROSIS OF RIGHT HIP  POST-OPERATIVE DIAGNOSIS:  AVASCULAR NECROSIS OF RIGHT HIP  PROCEDURE:  Procedure(s): RIGHT TOTAL HIP ARTHROPLASTY ANTERIOR APPROACH (Right)  SURGEON: Laurene Footman, MD  ASSISTANTS: none  ANESTHESIA:   spinal  EBL:  Total I/O In: 1000 [I.V.:1000] Out: 400 [Urine:200; Blood:200]  BLOOD ADMINISTERED:none  DRAINS: none   LOCAL MEDICATIONS USED:  MARCAINE    and OTHER Exparel  SPECIMEN:  Source of Specimen:  Right femoral head  DISPOSITION OF SPECIMEN:  PATHOLOGY  COUNTS:  YES  TOURNIQUET:  * No tourniquets in log *  IMPLANTS: Medacta AMIS 5 standard stem with 56 mm Mpact TM cup and liner with metal XL 28 mm head  DICTATION: .Dragon Dictation   The patient was brought to the operating room and after spinal anesthesia was obtained patient was placed on the operative table with the ipsilateral foot into the Medacta attachment, contralateral leg on a well-padded table. C-arm was brought in and preop template x-ray taken. After prepping and draping in usual sterile fashion appropriate patient identification and timeout procedures were completed. Anterior approach to the hip was obtained and centered over the greater trochanter and TFL muscle. The subcutaneous tissue was incised hemostasis being achieved by electrocautery. TFL fascia was incised and the muscle retracted laterally deep retractor placed. The lateral femoral circumflex vessels were identified and ligated. The anterior capsule was exposed and a capsulotomy performed. The neck was identified and a femoral neck cut carried out with a saw. The head was removed without difficulty and showed  femoral head with cartilage it was peeling off consistent with avascular necrosis, and acetabulum showing synovitis. Reaming was carried out to 54 mm and a 56 mm cup trial gave appropriate tightness to the acetabular  component a 56 DM cup was impacted into position. The leg was then externally rotated and ischiofemoral and pubofemoral releases carried out. The femur was sequentially broached to a size 5, size 5 standard with M and then XL heads trials were placed and the final components chosen. The 5 standard stem was inserted along with a XL metal 28 mm head and 56 mm liner. The hip was reduced and was stable the wound was thoroughly irrigated with fibrillar placed along the posterior capsule and medial neck. The deep fascia ws closed using a heavy Quill after infiltration of 30 cc of quarter percent Sensorcaine with epinephrine.3-0 V-loc to close the skin with skin staples.  Incisional wound VAC applied and patient was sent to recovery in stable condition.   PLAN OF CARE: Admit to inpatient

## 2018-11-03 NOTE — Anesthesia Procedure Notes (Addendum)
Spinal  Patient location during procedure: OR Start time: 11/03/2018 10:38 AM End time: 11/03/2018 10:35 AM Staffing Resident/CRNA: Nelda Marseille, CRNA Performed: resident/CRNA  Preanesthetic Checklist Completed: patient identified, site marked, surgical consent, pre-op evaluation, timeout performed, IV checked, risks and benefits discussed and monitors and equipment checked Spinal Block Patient position: sitting Prep: Betadine Patient monitoring: heart rate, continuous pulse ox, blood pressure and cardiac monitor Approach: midline Location: L3-4 Injection technique: single-shot Needle Needle type: Whitacre and Introducer  Needle gauge: 25 G Needle length: 9 cm Additional Notes Negative paresthesia. Negative blood return. Positive free-flowing CSF. Expiration date of kit checked and confirmed. Patient tolerated procedure well, without complications.

## 2018-11-03 NOTE — TOC Progression Note (Signed)
Transition of Care [facility]) - Progression Note    Patient Details  Name: Harold Tate MRN: [id number] Date of Birth: 11/17/1965  Transition of [facility] CM/SW Contact  Marilee Ditommaso, Lenice Llamas Phone Number: [phone]  11/03/2018, 2:17 PM  Clinical Narrative: Lovenox price requested.           Expected Discharge Plan and Services                                                 Social Determinants of Health (SDOH) Interventions    Readmission Risk Interventions No flowsheet data found.

## 2018-11-03 NOTE — Evaluation (Signed)
Physical Therapy Evaluation Patient Details Name: Harold Tate MRN: [id number] DOB: [birth year] Today's Date: 11/03/2018   History of Present Illness  admitted for acute hospitalization status post R THR, anterior approach, WBAT (11/03/18)  Clinical Impression  Upon evaluation, patient alert and oriented; follows commands, agreeable to participate with session.  Eager for repositioning and improved pain control.  R LE strength and ROM very guarded and generally limited due to pain, but tolerates movement throughout functional range for basic transfers and gait.  Patient generally restless in bed due to pain; eager for repositioning and improved pain control.  Intermittent anxiety related to pain and movement attempts; responds well to cuing/education for progressive relaxation, pursed lip breathing.  Currently requiring min assist for bed mobility; min assist for sit/stand, basic transfers and gait (5') with RW, min assist. Limited tolerance for R LE WBing (due to pain); constant cuing for R foot flat, hip/knee extension as tolerated.  Additional distance limited by pain.  Patient does appear to calm and relax with positioning in chair; encouraged for OOB to chair for at least one hour as tolerated. Would benefit from skilled PT to address above deficits and promote optimal return to PLOF; Recommend transition to [place] upon discharge from acute hospitalization.     Follow Up Recommendations Home health PT    Equipment Recommendations  3in1 (PT)    Recommendations for Other Services       Precautions / Restrictions Precautions Precautions: Fall;Anterior Hip Restrictions Weight Bearing Restrictions: Yes RLE Weight Bearing: Weight bearing as tolerated      Mobility  Bed Mobility Overal bed mobility: Needs Assistance Bed Mobility: Supine to Sit     Supine to sit: Min assist     General bed mobility comments: for R LE position/protection  Transfers Overall transfer level: Needs  assistance Equipment used: Rolling walker (2 wheeled) Transfers: Sit to/from Stand Sit to Stand: Min assist         General transfer comment: cuing for hand placement, overall technique; patient very anxious of initial movement  Ambulation/Gait Ambulation/Gait assistance: Min guard;Min assist Gait Distance (Feet): 5 Feet Assistive device: Rolling walker (2 wheeled)       General Gait Details: 3-point, step to gait pattern with decreased WBing R LE. Min cuing for R foot flat R hip/knee extension as tolerated. Heavy WBing bilat UEs.  Stairs            Wheelchair Mobility    Modified Rankin (Stroke Patients Only)       Balance Overall balance assessment: Needs assistance Sitting-balance support: No upper extremity supported;Feet supported Sitting balance-Leahy Scale: Fair Sitting balance - Comments: offset to L to unweight R hip   Standing balance support: Bilateral upper extremity supported Standing balance-Leahy Scale: Fair                               Pertinent Vitals/Pain Pain Assessment: Faces Faces Pain Scale: Hurts whole lot Pain Location: R hip Pain Descriptors / Indicators: Aching;Grimacing;Guarding Pain Intervention(s): Limited activity within patient's tolerance;Monitored during session;Premedicated before session;Repositioned    Home Living Family/patient expects to be discharged to:: Private residence Living Arrangements: Spouse/significant other Available Help at Discharge: Family Type of Home: House Home Access: Stairs to enter Entrance Stairs-Rails: None Entrance Stairs-Number of Steps: 3 Home Layout: Two level;Able to live on main level with bedroom/bathroom Home Equipment: Walker - 2 wheels      Prior Function Level of Independence: Independent  Comments: Indep with ADLs, household and community mobilization without assist device; does endorse recent use of RW due ot progressive R hip pain.  Working full-time as  Arts administrator        Extremity/Trunk Assessment   Upper Extremity Assessment Upper Extremity Assessment: Overall WFL for tasks assessed    Lower Extremity Assessment Lower Extremity Assessment: (R hip grossly 3-/5 generally guarded and limited by pain; otherwise, LEs at least 4/5)       Communication      Cognition Arousal/Alertness: Awake/alert Behavior During Therapy: WFL for tasks assessed/performed Overall Cognitive Status: Within Functional Limits for tasks assessed                                        General Comments      Exercises Other Exercises Other Exercises: Supine R LE therex, 1x10: ankle pumps, quad sets.  Patient increasingly restless in supine position (due to pain) requesting transition to unsupported sitting edge o f bed for position change.   Assessment/Plan    PT Assessment Patient needs continued PT services  PT Problem List Decreased mobility;Decreased strength;Decreased activity tolerance;Decreased safety awareness;Decreased range of motion;Decreased balance;Pain;Decreased skin integrity;Decreased knowledge of use of DME;Decreased knowledge of precautions       PT Treatment Interventions DME instruction;Stair training;Therapeutic activities;Balance training;Gait training;Functional mobility training;Therapeutic exercise;Patient/family education    PT Goals (Current goals can be found in the Care Plan section)  Acute Rehab PT Goals Patient Stated Goal: to make the pain better PT Goal Formulation: With patient Time For Goal Achievement: 11/17/18 Potential to Achieve Goals: Good    Frequency BID   Barriers to discharge        Co-evaluation               AM-PAC PT "6 Clicks" Mobility  Outcome Measure Help needed turning from your back to your side while in a flat bed without using bedrails?: A Little Help needed moving from lying on your back to sitting on the side of a flat bed  without using bedrails?: A Little Help needed moving to and from a bed to a chair (including a wheelchair)?: A Little Help needed standing up from a chair using your arms (e.g., wheelchair or bedside chair)?: A Little Help needed to walk in hospital room?: A Little Help needed climbing 3-5 steps with a railing? : A Lot 6 Click Score: 17    End of Session Equipment Utilized During Treatment: Gait belt Activity Tolerance: Patient limited by pain;Patient tolerated treatment well Patient left: in chair;with call bell/phone within reach;with chair alarm set Nurse Communication: Mobility status PT Visit Diagnosis: Muscle weakness (generalized) (M62.81);Difficulty in walking, not elsewhere classified (R26.2);Pain Pain - Right/Left: Right Pain - part of body: Hip    Time: 1549-1630 PT Time Calculation (min) (ACUTE ONLY): 41 min   Charges:   PT Evaluation $PT Eval Moderate Complexity: 1 Mod PT Treatments $Therapeutic Exercise: 8-22 mins       Lamontae Ricardo H. Owens Shark, PT, DPT, NCS 11/03/18, 6:26 PM [phone]

## 2018-11-03 NOTE — TOC Benefit Eligibility Note (Signed)
Transition of Care [facility]) Benefit Eligibility Note    Patient Details  Name: MARKEVIOUS EHMKE MRN: [id number] Date of Birth: 1965-07-22   Medication/Dose: Lovenox 38m once daily for 14 days  Covered?: No  Prescription Coverage [facility]  Spoke with Person/Company/Phone Number:: AVicente Maleswith [employer]  Prior Approval: Yes(PA required for name brand: [phone]  Deductible: Unmet($200 deductible, $16.68 met as of time of call.)  Additional Notes: Generic Enoxaparin covered with no PA required.  Considered Tier 2.  Estimated copay: $9.29.    HDannette BarbaraPhone Number: [phone] [phone]/10/2018, 2:38 PM

## 2018-11-03 NOTE — TOC Initial Note (Signed)
Transition of Care [facility]) - Initial/Assessment Note    Patient Details  Name: Harold Tate MRN: [id number] Date of Birth: 03/16/1966  Transition of [facility]) CM/SW Contact:    Aliyanah Rozas, Lenice Llamas Phone Number: [phone]  11/03/2018, 3:34 PM  Clinical Narrative:  Clinical Social Worker (CSW) met with patient to discuss D/C plan. Patient was alert and oriented X4 and was sitting up in the bed. CSW introduced self and explained role of CSW department. Patient reported that he lives in [place] and his son Landry Mellow will be staying with him. Per patient his girlfriend [place] [employer] will be coming over to his house often to help him. Patient reported that his sister also lives close by and can help him. Patient reported that he has a walker at home and requested a bedside commode. [employer] agency representative is aware of above. CSW explained that patient's surgeon prefers [facility] for home health. Patient is agreeable to using [facility]. Helene Kelp [facility] representative is aware of above. CSW notified patient of Lovenox price $9.29. CSW will continue to follow and assist as needed.              Expected Discharge Plan: [place] Barriers to Discharge: Continued Medical Work up   Patient Goals and CMS Choice Patient states their goals for this hospitalization and ongoing recovery are:: Pain control   Choice offered to / list presented to : Patient  Expected Discharge Plan and Services Expected Discharge Plan: [place] In-house Referral: Clinical Social Work Discharge Planning Services: CM Consult Post Acute Care Choice: [place] arrangements for the past 2 months: Single Family Home                 DME Arranged: Bedside commode DME Agency: [employer] Date DME Agency Contacted: 11/03/18   Representative spoke with at DME Agency: [place]: PT [place]: [facility] [employer] [employer] Date [place]:  11/03/18   Representative spoke with at [place]: Helene Kelp  Prior Living Arrangements/Services Living arrangements for the past 2 months: [place] with:: Adult Children Patient language and need for interpreter reviewed:: No Do you feel safe going back to the place where you live?: Yes      Need for Family Participation in Patient Care: No (Comment) Care giver support system in place?: Yes (comment)   Criminal Activity/Legal Involvement Pertinent to Current Situation/Hospitalization: No - Comment as needed  Activities of Daily Living Home Assistive Devices/Equipment: Gilford Rile (specify type) ADL Screening (condition at time of admission) Patient's cognitive ability adequate to safely complete daily activities?: Yes Is the patient deaf or have difficulty hearing?: No Does the patient have difficulty seeing, even when wearing glasses/contacts?: No Does the patient have difficulty concentrating, remembering, or making decisions?: No Patient able to express need for assistance with ADLs?: Yes Does the patient have difficulty dressing or bathing?: No Independently performs ADLs?: Yes (appropriate for developmental age) Does the patient have difficulty walking or climbing stairs?: No Weakness of Legs: None Weakness of Arms/Hands: None  Permission Sought/Granted Permission sought to share information with : (home health agency and DME agency) Permission granted to share information with : Yes, Verbal Permission Granted              Emotional Assessment Appearance:: Appears stated age   Affect (typically observed): Pleasant, Calm Orientation: : Oriented to Self, Oriented to Place, Oriented to  Time, Oriented to Situation Alcohol /  Substance Use: Not Applicable Psych Involvement: No (comment)  Admission diagnosis:  AVASCULAR NECROSIS OF RIGHT HIP Patient Active Problem List   Diagnosis Date Noted  . Status post total hip replacement, right 11/03/2018  .  Osteoarthritis of both hips 10/01/2018  . Leg weakness 09/23/2018  . Motor level spinal weakness ([place]) 09/17/2018  . Anxiety 07/21/2018  . Leg pain, bilateral 05/05/2018  . Bunion of great toe of right foot 06/18/2017  . DM (diabetes mellitus), type 2 ([place]) 11/05/2016  . Post-traumatic osteoarthritis of ankle 08/12/2016  . Hypercholesterolemia 06/06/2016  . Raynaud's syndrome 09/30/2014  . Generalized anxiety disorder 09/30/2014  . Tobacco dependence syndrome 09/30/2014  . Benign hypertension 09/30/2014  . Deep vein thrombosis of left lower limb ([place]) 09/30/2014  . Osteomyelitis ([place]) 09/07/2013  . Lesion of ulnar nerve 05/11/2013  . Ulnar nerve palsy 04/08/2013   PCP:  Guadalupe Maple, MD Pharmacy:   [place], [place] [address] [place] [address] Phone: [phone] Fax: [place], [place] [facility] [address] [place] [facility] [place] [address] Phone: [phone] Fax: [phone]     Social Determinants of Health (SDOH) Interventions    Readmission Risk Interventions No flowsheet data found.

## 2018-11-03 NOTE — Anesthesia Procedure Notes (Signed)
Date/Time: 11/03/2018 10:35 AM Performed by: Nelda [place], CRNA Pre-anesthesia Checklist: Patient identified, Emergency Drugs available, Suction available, Patient being monitored and Timeout performed Oxygen Delivery Method: Simple face mask

## 2018-11-03 NOTE — Transfer of Care (Signed)
Immediate Anesthesia Transfer of Care Note  Patient: Harold Tate  Procedure(s) Performed: RIGHT TOTAL HIP ARTHROPLASTY ANTERIOR APPROACH (Right Hip)  Patient Location: PACU  Anesthesia Type:Spinal  Level of Consciousness: awake, alert  and oriented  Airway & Oxygen Therapy: Patient Spontanous Breathing and Patient connected to nasal cannula oxygen  Post-op Assessment: Report given to RN and Post -op Vital signs reviewed and stable  Post vital signs: Reviewed and stable  Last Vitals:  Vitals Value Taken Time  BP 102/71 11/03/18 1148  Temp    Pulse 95 11/03/18 1148  Resp 16 11/03/18 1148  SpO2 96 % 11/03/18 1148  Vitals shown include unvalidated device data.  Last Pain:  Vitals:   11/03/18 1148  TempSrc:   PainSc: 0-No pain      Patients Stated Pain Goal: 2 (09/40/76 8088)  Complications: No apparent anesthesia complications

## 2018-11-03 NOTE — Anesthesia Preprocedure Evaluation (Addendum)
Anesthesia Evaluation  Patient identified by MRN, date of birth, ID band Patient awake    Reviewed: Allergy & Precautions, H&P , NPO status , Patient's Chart, lab work & pertinent test results  Airway Mallampati: III  TM Distance: >3 FB     Dental  (+) Missing, Caps   Pulmonary shortness of breath, sleep apnea , COPD, Current Smoker,           Cardiovascular hypertension, (-) angina(-) Past MI and (-) Cardiac Stents (-) dysrhythmias      Neuro/Psych PSYCHIATRIC DISORDERS Anxiety negative neurological ROS     GI/Hepatic negative GI ROS, (+)     substance abuse (reports an average 6 drinks/day)  alcohol use,   Endo/Other  diabetes  Renal/GU      Musculoskeletal  (+) Arthritis ,   Abdominal   Peds  Hematology negative hematology ROS (+)   Anesthesia Other Findings Obese  Past Medical History: No date: Anxiety No date: Arthritis No date: Diabetes mellitus without complication (HCC) No date: Dyspnea No date: Hip pain No date: Hypertension No date: Raynaud's syndrome No date: Sleep apnea  Past Surgical History: No date: I&D EXTREMITY July 2014: OPEN REDUCTION INTERNAL FIXATION (ORIF) TIBIA/FIBULA  FRACTURE; Left     Reproductive/Obstetrics negative OB ROS                          Anesthesia Physical Anesthesia Plan  ASA: III  Anesthesia Plan: Spinal   Post-op Pain Management:    Induction:   PONV Risk Score and Plan: Propofol infusion  Airway Management Planned: Natural Airway and Simple Face Mask  Additional Equipment:   Intra-op Plan:   Post-operative Plan:   Informed Consent: I have reviewed the patients History and Physical, chart, labs and discussed the procedure including the risks, benefits and alternatives for the proposed anesthesia with the patient or authorized representative who has indicated his/her understanding and acceptance.     Dental Advisory  Given  Plan Discussed with: Anesthesiologist and CRNA  Anesthesia Plan Comments:        Anesthesia Quick Evaluation

## 2018-11-04 ENCOUNTER — Encounter: Payer: Self-pay | Admitting: Orthopedic Surgery

## 2018-11-04 LAB — BASIC METABOLIC PANEL
Anion gap: 7 (ref 5–15)
BUN: 7 mg/dL (ref 6–20)
CO2: 25 mmol/L (ref 22–32)
Calcium: 8.1 mg/dL — ABNORMAL LOW (ref 8.9–10.3)
Chloride: 100 mmol/L (ref 98–111)
Creatinine, Ser: 0.59 mg/dL — ABNORMAL LOW (ref 0.61–1.24)
GFR calc Af Amer: >60 mL/min (ref >60)
GFR calc non Af Amer: >60 mL/min (ref >60)
Glucose, Bld: 161 mg/dL — ABNORMAL HIGH (ref 70–99)
Potassium: 3.9 mmol/L (ref 3.5–5.1)
Sodium: 132 mmol/L — ABNORMAL LOW (ref 135–145)

## 2018-11-04 LAB — CBC
HCT: 37.8 % — ABNORMAL LOW (ref 39.0–52.0)
Hemoglobin: 13.3 g/dL (ref 13.0–17.0)
MCH: 34.9 pg — ABNORMAL HIGH (ref 26.0–34.0)
MCHC: 35.2 g/dL (ref 30.0–36.0)
MCV: 99.2 fL (ref 80.0–100.0)
Platelets: 146 10*3/uL — ABNORMAL LOW (ref 150–400)
RBC: 3.81 MIL/uL — ABNORMAL LOW (ref 4.22–5.81)
RDW: 12.9 % (ref 11.5–15.5)
WBC: 7.2 10*3/uL (ref 4.0–10.5)
nRBC: 0 % (ref 0.0–0.2)

## 2018-11-04 MED ORDER — NICOTINE 14 MG/24HR TD PT24
14.0000 mg | MEDICATED_PATCH | Freq: Every day | TRANSDERMAL | Status: DC
  Administered 2018-11-04 – 2018-11-05 (×2): 14 mg via TRANSDERMAL
  Filled 2018-11-04 (×3): qty 1

## 2018-11-04 NOTE — Progress Notes (Signed)
Physical Therapy Treatment Patient Details Name: Harold Tate MRN: [id number] DOB: 1966-03-30 Today's Date: 11/04/2018    History of Present Illness admitted for acute hospitalization status post R THR, anterior approach, WBAT (11/03/18)    PT Comments    Patient requiring significantly increased time and for initiation and completion of all functional activities.  Was able to increase gait distance, though still tolerating distance of only 25' at a time (min assist with RW).  Very slow and deliberate; constant cuing for postural extension, weight shift and overall cadence.    Follow Up Recommendations  Home health PT     Equipment Recommendations  3in1 (PT)    Recommendations for Other Services       Precautions / Restrictions Precautions Precautions: Anterior Hip;Fall Restrictions Weight Bearing Restrictions: Yes RLE Weight Bearing: Weight bearing as tolerated    Mobility  Bed Mobility Overal bed mobility: Needs Assistance Bed Mobility: Supine to Sit     Supine to sit: Supervision     General bed mobility comments: increased time, heavy use of bedrails to complete  Transfers Overall transfer level: Needs assistance Equipment used: Rolling walker (2 wheeled) Transfers: Sit to/from Stand Sit to Stand: Min assist         General transfer comment: increased time/encouragement for all movement transitions; consistent cuing for hand/foot placement to maximize mechanics; min assist for initial lift off  Ambulation/Gait Ambulation/Gait assistance: Min assist Gait Distance (Feet): 25 Feet(x2) Assistive device: Rolling walker (2 wheeled)       General Gait Details: step to progressing to partial step through; min cuing for continuous vs broken cadence as tolerated.  Constant facilitation for postural extension, cuing for gait sequencing.  Limited R foot clearance and step length   Stairs             Wheelchair Mobility    Modified Rankin (Stroke  Patients Only)       Balance Overall balance assessment: Needs assistance Sitting-balance support: No upper extremity supported;Feet supported Sitting balance-Leahy Scale: Good     Standing balance support: Bilateral upper extremity supported Standing balance-Leahy Scale: Fair                              Cognition Arousal/Alertness: Awake/alert Behavior During Therapy: WFL for tasks assessed/performed Overall Cognitive Status: Within Functional Limits for tasks assessed                                        Exercises Other Exercises Other Exercises: Lower body dressing to don pants, mod assist, emphasis on hemi-dressing techniques for optimal pain management; upper body dressing, set up/sup. Other Exercises: Sit/stand from edge of bed (elevated) x2 with RW, heavy min assist with increased time; sit/stand from standard chair with bilat arm rests x1 with RW, mod assist for lift off (min assist once up)    General Comments        Pertinent Vitals/Pain Pain Assessment: Faces Faces Pain Scale: Hurts even more Pain Location: R hip Pain Descriptors / Indicators: Aching;Grimacing;Guarding Pain Intervention(s): Limited activity within patient's tolerance;Monitored during session;Premedicated before session;Repositioned;Patient requesting pain meds-RN notified;RN gave pain meds during session    Home Living                      Prior Function  PT Goals (current goals can now be found in the care plan section) Acute Rehab PT Goals Patient Stated Goal: to make the pain better PT Goal Formulation: With patient Time For Goal Achievement: 11/17/18 Potential to Achieve Goals: Good Progress towards PT goals: Progressing toward goals    Frequency    BID      PT Plan Current plan remains appropriate    Co-evaluation              AM-PAC PT "6 Clicks" Mobility   Outcome Measure  Help needed turning from your back to  your side while in a flat bed without using bedrails?: A Little Help needed moving from lying on your back to sitting on the side of a flat bed without using bedrails?: A Little Help needed moving to and from a bed to a chair (including a wheelchair)?: A Little Help needed standing up from a chair using your arms (e.g., wheelchair or bedside chair)?: A Lot Help needed to walk in hospital room?: A Lot Help needed climbing 3-5 steps with a railing? : A Lot 6 Click Score: 15    End of Session Equipment Utilized During Treatment: Gait belt Activity Tolerance: Patient tolerated treatment well Patient left: in bed;with call bell/phone within reach;with bed alarm set(refused OOB in chair due to pain) Nurse Communication: Mobility status PT Visit Diagnosis: Muscle weakness (generalized) (M62.81);Difficulty in walking, not elsewhere classified (R26.2);Pain Pain - Right/Left: Right Pain - part of body: Hip     Time: 3383-2919 PT Time Calculation (min) (ACUTE ONLY): 66 min  Charges:  $Gait Training: 23-37 mins $Therapeutic Exercise: 23-37 mins $Therapeutic Activity: 23-37 mins                     Harold Tate, PT, DPT, NCS 11/04/18, 3:45 PM [phone]

## 2018-11-04 NOTE — Progress Notes (Signed)
   Subjective: 1 Day Post-Op Procedure(s) (LRB): RIGHT TOTAL HIP ARTHROPLASTY ANTERIOR APPROACH (Right) Patient reports pain as severe.   Patient is well, and has had no acute complaints or problems Denies any CP, SOB, ABD pain. We will continue therapy today.  Plan is to go Home after hospital stay.  Objective: Vital signs in last 24 hours: Temp:  [97.2 F (36.2 C)-99.1 F (37.3 C)] 99.1 F (37.3 C) (07/08 0751) Pulse Rate:  [67-99] 95 (07/08 0751) Resp:  [15-23] 20 (07/08 0751) BP: (102-160)/(59-99) 146/97 (07/08 0751) SpO2:  [93 %-98 %] 96 % (07/08 0751) Weight:  [111.1 kg] 111.1 kg (07/07 0817)  Intake/Output from previous day: 07/07 0701 - 07/08 0700 In: 1541.9 [I.V.:1538.7; IV Piggyback:3.3] Out: 3200 [Urine:2800; Drains:200; Blood:200] Intake/Output this shift: No intake/output data recorded.  Recent Labs    11/03/18 1430 11/04/18 0401  HGB 13.8 13.3   Recent Labs    11/03/18 1430 11/04/18 0401  WBC 9.3 7.2  RBC 4.04* 3.81*  HCT 40.5 37.8*  PLT 162 146*   Recent Labs    11/03/18 1430 11/04/18 0401  NA  --  132*  K  --  3.9  CL  --  100  CO2  --  25  BUN  --  7  CREATININE 0.72 0.59*  GLUCOSE  --  161*  CALCIUM  --  8.1*   No results for input(s): LABPT, INR in the last 72 hours.  EXAM General - Patient is Alert, Appropriate and Oriented Extremity - Neurovascular intact Sensation intact distally Intact pulses distally Dorsiflexion/Plantar flexion intact Incision: dressing C/D/I and moderate drainage No cellulitis present Compartment soft Dressing - 300 cc drainage hemovac Motor Function - intact, moving foot and toes well on exam.   Past Medical History:  Diagnosis Date  . Anxiety   . Arthritis   . Diabetes mellitus without complication ([place])   . Dyspnea   . Hip pain   . Hypertension   . Raynaud's syndrome   . Sleep apnea     Assessment/Plan:   1 Day Post-Op Procedure(s) (LRB): RIGHT TOTAL HIP ARTHROPLASTY ANTERIOR APPROACH  (Right) Active Problems:   Status post total hip replacement, right  Estimated body mass index is 32.32 kg/m as calculated from the following:   Height as of this encounter: 6\' 1"  (1.854 m).   Weight as of this encounter: 111.1 kg. Advance diet Up with therapy  Needs bowel movement Vital signs stable Labs stable Encourage incentive spirometer Care management to assist with discharge.  DVT Prophylaxis - Lovenox, TED hose and SCDs Weight-Bearing as tolerated to right leg   T. Rachelle Hora, PA-C [place] 11/04/2018, 8:16 AM

## 2018-11-04 NOTE — Progress Notes (Signed)
Physical Therapy Treatment Patient Details Name: Harold Tate MRN: [id number] DOB: Jan 22, 1966 Today's Date: 11/04/2018    History of Present Illness admitted for acute hospitalization status post R THR, anterior approach, WBAT (11/03/18)    PT Comments    Continues to be very limited, very focused on pain; RN informed/aware and meds requested during session. Patient requiring increased assist for all mobility tasks this date, requiring very specific (single-step) cuing and consistent encouragement for participation with session.  Patient with multiple needs, requests during session; very easily distracted by external environment and with marked difficulty attending to single task at a time. Will continue to progress gait efforts as able; anticipate need for additional stay in hospital to facilitate safe discharge home due to limited progression thus far.    Follow Up Recommendations  Home health PT     Equipment Recommendations  3in1 (PT)    Recommendations for Other Services       Precautions / Restrictions Precautions Precautions: Fall;Anterior Hip Restrictions Weight Bearing Restrictions: Yes RLE Weight Bearing: Weight bearing as tolerated    Mobility  Bed Mobility Overal bed mobility: Needs Assistance Bed Mobility: Supine to Sit     Supine to sit: Mod assist     General bed mobility comments: assist for R LE position/protection and truncal elevation  Transfers Overall transfer level: Needs assistance Equipment used: Rolling walker (2 wheeled) Transfers: Sit to/from Stand Sit to Stand: Min assist;Mod assist;+2 safety/equipment         General transfer comment: consistent cuing for hand placement, sequence/technique; easily overwhelmed by external environment  Ambulation/Gait Ambulation/Gait assistance: Min assist;+2 safety/equipment Gait Distance (Feet): 6 Feet Assistive device: Rolling walker (2 wheeled)       General Gait Details: 3-point, step to gait  pattern; assist for initial R LE advancement, improving with distance.  Very heavy WBing bilat UEs; very effortful, guarded gait perofrmance.  Additional distance limited by pain.   Stairs             Wheelchair Mobility    Modified Rankin (Stroke Patients Only)       Balance Overall balance assessment: Needs assistance Sitting-balance support: No upper extremity supported;Feet supported Sitting balance-Leahy Scale: Fair     Standing balance support: Bilateral upper extremity supported Standing balance-Leahy Scale: Fair                              Cognition Arousal/Alertness: Awake/alert Behavior During Therapy: WFL for tasks assessed/performed Overall Cognitive Status: Within Functional Limits for tasks assessed                                        Exercises Other Exercises Other Exercises: Lower body dressing to don undergarments, mod assist to thread LEs and pull over hips; sit/stand from edge of bed (elevated per patient insistence), min/mod assist +2    General Comments        Pertinent Vitals/Pain Pain Assessment: Faces Faces Pain Scale: Hurts whole lot Pain Location: R hip Pain Descriptors / Indicators: Aching;Grimacing;Guarding Pain Intervention(s): Limited activity within patient's tolerance;Monitored during session;Patient requesting pain meds-RN notified;Repositioned;Premedicated before session    Home Living                      Prior Function            PT Goals (  current goals can now be found in the care plan section) Acute Rehab PT Goals Patient Stated Goal: to make the pain better PT Goal Formulation: With patient Time For Goal Achievement: 11/17/18 Potential to Achieve Goals: Good Progress towards PT goals: Progressing toward goals    Frequency    BID      PT Plan Current plan remains appropriate    Co-evaluation              AM-PAC PT "6 Clicks" Mobility   Outcome Measure   Help needed turning from your back to your side while in a flat bed without using bedrails?: A Lot Help needed moving from lying on your back to sitting on the side of a flat bed without using bedrails?: A Lot Help needed moving to and from a bed to a chair (including a wheelchair)?: A Lot Help needed standing up from a chair using your arms (e.g., wheelchair or bedside chair)?: A Lot Help needed to walk in hospital room?: A Lot Help needed climbing 3-5 steps with a railing? : Total 6 Click Score: 11    End of Session Equipment Utilized During Treatment: Gait belt Activity Tolerance: Patient limited by pain Patient left: in chair;with call bell/phone within reach;with chair alarm set Nurse Communication: Mobility status PT Visit Diagnosis: Muscle weakness (generalized) (M62.81);Difficulty in walking, not elsewhere classified (R26.2);Pain Pain - Right/Left: Right Pain - part of body: Hip     Time: 3546-5681 PT Time Calculation (min) (ACUTE ONLY): 41 min  Charges:  $Gait Training: 8-22 mins $Therapeutic Exercise: 23-37 mins                     Hondo Nanda H. Owens Shark, PT, DPT, NCS 11/04/18, 11:52 AM [phone]

## 2018-11-04 NOTE — Anesthesia Postprocedure Evaluation (Signed)
Anesthesia Post Note  Patient: Harold Tate  Procedure(s) Performed: RIGHT TOTAL HIP ARTHROPLASTY ANTERIOR APPROACH (Right Hip)  Patient location during evaluation: Nursing Unit Anesthesia Type: Spinal Level of consciousness: awake and alert and oriented Pain management: pain level controlled Vital Signs Assessment: post-procedure vital signs reviewed and stable Respiratory status: spontaneous breathing Cardiovascular status: stable and blood pressure returned to baseline Postop Assessment: no headache, no backache, patient able to bend at knees, adequate PO intake, no apparent nausea or vomiting and able to ambulate Anesthetic complications: no     Last Vitals:  Vitals:   11/03/18 2334 11/04/18 0419  BP: 139/86 (!) 160/95  Pulse: 90 95  Resp: 19 19  Temp: 36.9 C 36.9 C  SpO2: 93% 96%    Last Pain:  Vitals:   11/04/18 0419  TempSrc: Oral  PainSc:                  Lanora Manis

## 2018-11-05 MED ORDER — METHOCARBAMOL 500 MG PO TABS: 500.0000 mg | ORAL_TABLET | Freq: Four times a day (QID) | ORAL | 0 refills | Status: DC | PRN

## 2018-11-05 MED ORDER — BISACODYL 10 MG RE SUPP
10.0000 mg | Freq: Once | RECTAL | Status: AC
  Administered 2018-11-05: 10 mg via RECTAL
  Filled 2018-11-05: qty 1

## 2018-11-05 MED ORDER — DOCUSATE SODIUM 100 MG PO CAPS: 100.0000 mg | ORAL_CAPSULE | Freq: Two times a day (BID) | ORAL | 0 refills | Status: DC

## 2018-11-05 MED ORDER — ACETAMINOPHEN 325 MG PO TABS: 325.0000 mg | ORAL_TABLET | Freq: Four times a day (QID) | ORAL | Status: DC | PRN

## 2018-11-05 MED ORDER — OXYCODONE HCL 5 MG PO TABS: 5.0000 mg | ORAL_TABLET | ORAL | 0 refills | Status: DC | PRN

## 2018-11-05 MED ORDER — ENOXAPARIN SODIUM 40 MG/0.4ML ~~LOC~~ SOLN: 40.0000 mg | SUBCUTANEOUS | 0 refills | Status: DC

## 2018-11-05 NOTE — Discharge Summary (Addendum)
Physician Discharge Summary  Patient ID: Harold Tate MRN: [id number] DOB/AGE: 06/03/1965 53 y.o.  Admit date: 11/03/2018 Discharge date: 11/06/2018 Admission Diagnoses:  AVASCULAR NECROSIS OF RIGHT HIP   Discharge Diagnoses: Patient Active Problem List   Diagnosis Date Noted  . Status post total hip replacement, right 11/03/2018  . Osteoarthritis of both hips 10/01/2018  . Leg weakness 09/23/2018  . Motor level spinal weakness ([place]) 09/17/2018  . Anxiety 07/21/2018  . Leg pain, bilateral 05/05/2018  . Bunion of great toe of right foot 06/18/2017  . DM (diabetes mellitus), type 2 ([place]) 11/05/2016  . Post-traumatic osteoarthritis of ankle 08/12/2016  . Hypercholesterolemia 06/06/2016  . Raynaud's syndrome 09/30/2014  . Generalized anxiety disorder 09/30/2014  . Tobacco dependence syndrome 09/30/2014  . Benign hypertension 09/30/2014  . Deep vein thrombosis of left lower limb ([place]) 09/30/2014  . Osteomyelitis ([place]) 09/07/2013  . Lesion of ulnar nerve 05/11/2013  . Ulnar nerve palsy 04/08/2013    Past Medical History:  Diagnosis Date  . Anxiety   . Arthritis   . Diabetes mellitus without complication ([place])   . Dyspnea   . Hip pain   . Hypertension   . Raynaud's syndrome   . Sleep apnea      Transfusion: none   Consultants (if any):   Discharged Condition: Improved  Hospital Course: Harold Tate is an 53 y.o. male who was admitted 11/03/2018 with a diagnosis of right hip osteoarthritis and went to the operating room on 11/03/2018 and underwent the above named procedures.    Surgeries: Procedure(s): RIGHT TOTAL HIP ARTHROPLASTY ANTERIOR APPROACH on 11/03/2018 Patient tolerated the surgery well. Taken to PACU where she was stabilized and then transferred to the orthopedic floor.  Started on Lovenox 40 mg q 24 hrs. Foot pumps applied bilaterally at 80 mm. Heels elevated on bed with rolled towels. No evidence of DVT. Negative Homan. Physical therapy started on day #1 for  gait training and transfer. OT started day #1 for ADL and assisted devices.  Patient's foley was d/c on day #1. Patient's IV was d/c on day #2.  On post op day #3 patient was stable and ready for discharge to home with HHPT.  Implants: Medacta AMIS 5 standard stem with 56 mm Mpact TM cup and liner with metal XL 28 mm head  He was given perioperative antibiotics:  Anti-infectives (From admission, onward)   Start     Dose/Rate Route Frequency Ordered Stop   11/03/18 1400  ceFAZolin (ANCEF) IVPB 1 g/50 mL premix     1 g 100 mL/hr over 30 Minutes Intravenous Every 6 hours 11/03/18 1353 11/04/18 0744   11/03/18 0802  ceFAZolin (ANCEF) 2-4 GM/100ML-% IVPB    Note to Pharmacy: Ronnell Freshwater   : cabinet override      11/03/18 0802 11/03/18 1457   11/02/18 2145  ceFAZolin (ANCEF) IVPB 2g/100 mL premix     2 g 200 mL/hr over 30 Minutes Intravenous  Once 11/02/18 2139 11/03/18 1037    .  He was given sequential compression devices, early ambulation, and Lovenox, teds for DVT prophylaxis.  He benefited maximally from the hospital stay and there were no complications.    Recent vital signs:  Vitals:   11/04/18 2248 11/05/18 0756  BP: 129/80 120/72  Pulse: 95 (!) 101  Resp: 18 20  Temp: 99.4 F (37.4 C) 99 F (37.2 C)  SpO2: 91% 90%    Recent laboratory studies:  Lab Results  Component Value Date  HGB 13.3 11/04/2018   HGB 13.8 11/03/2018   HGB 15.7 10/27/2018   Lab Results  Component Value Date   WBC 7.2 11/04/2018   PLT 146 (L) 11/04/2018   Lab Results  Component Value Date   INR 1.0 10/27/2018   Lab Results  Component Value Date   NA 132 (L) 11/04/2018   K 3.9 11/04/2018   CL 100 11/04/2018   CO2 25 11/04/2018   BUN 7 11/04/2018   CREATININE 0.59 (L) 11/04/2018   GLUCOSE 161 (H) 11/04/2018    Discharge Medications:   Allergies as of 11/05/2018   No Known Allergies     Medication List    STOP taking these medications   cyclobenzaprine 10 MG  tablet Commonly known as: FLEXERIL   ibuprofen 600 MG tablet Commonly known as: ADVIL     TAKE these medications   acetaminophen 325 MG tablet Commonly known as: TYLENOL Take 1-2 tablets (325-650 mg total) by mouth every 6 (six) hours as needed for mild pain (pain score 1-3 or temp > 100.5).   benazepril 40 MG tablet Commonly known as: LOTENSIN Take 1 tablet (40 mg total) by mouth daily.   clonazePAM 0.5 MG tablet Commonly known as: KLONOPIN Take 1 tablet (0.5 mg total) by mouth 3 (three) times daily as neededfor anxiety. What changed: See the new instructions.   docusate sodium 100 MG capsule Commonly known as: COLACE Take 1 capsule (100 mg total) by mouth 2 (two) times daily.   enoxaparin 40 MG/0.4ML injection Commonly known as: LOVENOX Inject 0.4 mLs (40 mg total) into the skin daily for 14 days. Start taking on: November 06, 5007   folic acid 1 MG tablet Commonly known as: FOLVITE Take 1 mg by mouth daily.   lidocaine 5 % Commonly known as: LIDODERM Place 1 patch onto the skin daily as needed (pain). Remove & Discard patch within 12 hours or as directed by MD   methocarbamol 500 MG tablet Commonly known as: ROBAXIN Take 1 tablet (500 mg total) by mouth every 6 (six) hours as needed for muscle spasms.   oxyCODONE 5 MG immediate release tablet Commonly known as: Oxy IR/ROXICODONE Take 1-2 tablets (5-10 mg total) by mouth every 4 (four) hours as needed for moderate pain (pain score 4-6).   thiamine 100 MG tablet Commonly known as: VITAMIN B-1 Take 100 mg by mouth daily.   traMADol 50 MG tablet Commonly known as: ULTRAM Take 1 tablet (50 mg total) by mouth every 12 (twelve) hours as needed.            Durable Medical Equipment  (From admission, onward)         Start     Ordered   11/03/18 1354  DME Walker rolling  Once    Question:  Patient needs a walker to treat with the following condition  Answer:  Status post total hip replacement, right   11/03/18  1353   11/03/18 1354  DME 3 n 1  Once     11/03/18 1353   11/03/18 1354  DME Bedside commode  Once    Question:  Patient needs a bedside commode to treat with the following condition  Answer:  Status post total hip replacement, right   11/03/18 1353          Diagnostic Studies: Dg Hip Operative Unilat W Or W/o Pelvis Right  Result Date: 11/03/2018 CLINICAL DATA:  Anterior hip replacement. FLUOROSCOPY TIME:  18 seconds. EXAM: OPERATIVE RIGHT HIP (WITH  PELVIS IF PERFORMED) 2 VIEWS TECHNIQUE: Fluoroscopic spot image(s) were submitted for interpretation post-operatively. COMPARISON:  None. FINDINGS: The patient is status post right hip replacement by the end of the study. The acetabular and visualized portions of the femoral component are in good position. The distal femoral component is not included on today's study. IMPRESSION: Right hip replacement as above. Electronically Signed   By: Dorise Bullion III M.D   On: 11/03/2018 11:43   Dg Hip Unilat W Or W/o Pelvis 2-3 Views Right  Result Date: 11/03/2018 CLINICAL DATA:  Right hip replacement EXAM: DG HIP (WITH OR WITHOUT PELVIS) 2-3V RIGHT COMPARISON:  None. FINDINGS: Interval right total hip arthroplasty. No hardware failure or complication. Postsurgical changes in the surrounding soft tissues. IMPRESSION: Interval right total hip arthroplasty. Electronically Signed   By: Kathreen Devoid   On: 11/03/2018 13:10    Disposition:     Follow-up Information    Duanne Guess, PA-C Follow up in 2 week(s).   Specialties: Orthopedic Surgery, Emergency Medicine Contact information: [place] [address] [phone]            Signed: Feliberto Gottron 11/05/2018, 11:45 AM

## 2018-11-05 NOTE — Evaluation (Signed)
Occupational Therapy Evaluation Patient Details Name: Harold Tate MRN: [id number] DOB: 1965/12/14 Today's Date: 11/05/2018    History of Present Illness 53yo male admitted for acute hospitalization status post R THR, anterior approach, WBAT (11/03/18)   Clinical Impression   Pt seen for OT evaluation this date, POD#1 from above surgery. Pt was independent in all ADL prior to surgery, working full-time in [employer], but becoming increasingly limited by R hip pain requiring AD for mobility. Pt is eager to return to PLOF with less pain and improved safety and independence. Pt currently requires Mod assist for LB dressing and bathing while in seated position due to pain and limited AROM of R hip. Pt hesitant with movement and requires significant amount of time to reposition himself and to perform lateral scoots EOB. Pt instructed in self care skills, falls prevention strategies including pet care considerations, home/routines modifications, DME/AE for LB bathing and dressing tasks, and compression stocking mgt strategies. Pt would benefit from additional instruction in self care skills and techniques to help maintain precautions with or without assistive devices to support recall and carryover prior to discharge. Recommend HHOT upon discharge.     Follow Up Recommendations  Home health OT;Supervision - Intermittent    Equipment Recommendations  3 in 1 bedside commode;Other (comment)(reacher)    Recommendations for Other Services       Precautions / Restrictions Precautions Precautions: Anterior Hip;Fall Restrictions Weight Bearing Restrictions: Yes RLE Weight Bearing: Weight bearing as tolerated      Mobility Bed Mobility Overal bed mobility: Needs Assistance Bed Mobility: Sit to Supine     Sit to supine: Mod assist   General bed mobility comments: significantly increased time, and mod assist for RLE mgt, heavy use of BUE  Transfers Overall transfer level: Needs  assistance Equipment used: Rolling walker (2 wheeled) Transfers: Sit to/from Stand Sit to Stand: Min assist         General transfer comment: increased time/effort to perform    Balance Overall balance assessment: Needs assistance Sitting-balance support: No upper extremity supported;Feet supported Sitting balance-Leahy Scale: Good Sitting balance - Comments: offset to L to unweight R hip   Standing balance support: Bilateral upper extremity supported Standing balance-Leahy Scale: Fair                             ADL either performed or assessed with clinical judgement   ADL Overall ADL's : Needs assistance/impaired Eating/Feeding: Sitting;Independent   Grooming: Sitting;Independent   Upper Body Bathing: Sitting;Set up;Supervision/ safety   Lower Body Bathing: Sit to/from stand;Moderate assistance   Upper Body Dressing : Sitting;Set up;Supervision/safety   Lower Body Dressing: Sit to/from stand;Moderate assistance   Toilet Transfer: Minimal assistance;Comfort height toilet;Ambulation;RW                   Vision Patient Visual Report: No change from baseline Vision Assessment?: No apparent visual deficits     Perception     Praxis      Pertinent Vitals/Pain Pain Assessment: 0-10 Pain Score: 7  Faces Pain Scale: Hurts even more Pain Location: R hip Pain Descriptors / Indicators: Aching;Grimacing;Guarding Pain Intervention(s): Limited activity within patient's tolerance;Monitored during session;Premedicated before session;Repositioned;Relaxation     Hand Dominance Right   Extremity/Trunk Assessment Upper Extremity Assessment Upper Extremity Assessment: Overall WFL for tasks assessed   Lower Extremity Assessment Lower Extremity Assessment: Defer to PT evaluation(R hip grossly 3-/5 generally guarded and limited by pain; otherwise, LEs  at least 4/5)       Communication Communication Communication: No difficulties   Cognition  Arousal/Alertness: Awake/alert Behavior During Therapy: WFL for tasks assessed/performed Overall Cognitive Status: Within Functional Limits for tasks assessed                                 General Comments: slightly agitated at times, easily redirectable   General Comments       Exercises Other Exercises Other Exercises: pt instructed in falls prevention including pet care considerations, compression stocking mgt, AE/DME, and home/routines modifications   Shoulder Instructions      Home Living Family/patient expects to be discharged to:: Private residence Living Arrangements: Spouse/significant other Available Help at Discharge: Family Type of Home: House Home Access: Stairs to enter Technical brewer of Steps: 3 Entrance Stairs-Rails: None Home Layout: Two level;Able to live on main level with bedroom/bathroom     Bathroom Shower/Tub: Teacher, early years/pre: Standard     Home Equipment: Walker - 2 wheels          Prior Functioning/Environment Level of Independence: Independent        Comments: Indep with ADLs, household and community mobilization without assist device; does endorse recent use of RW due ot progressive R hip pain.  Working full-time as Microbiologist Problem List: Decreased strength;Decreased range of motion;Pain;Decreased knowledge of use of DME or AE      OT Treatment/Interventions: Self-care/ADL training;Therapeutic exercise;Therapeutic activities;DME and/or AE instruction;Patient/family education;Balance training    OT Goals(Current goals can be found in the care plan section) Acute Rehab OT Goals Patient Stated Goal: to make the pain better Time For Goal Achievement: 11/19/18 Potential to Achieve Goals: Good ADL Goals Pt Will Perform Lower Body Dressing: with min assist;sit to/from stand;with adaptive equipment Pt Will Transfer to Toilet: with supervision;ambulating;bedside  commode(BSC over toilet, LRAD for amb) Additional ADL Goal #1: Pt will independently instruct family/caregiver in compression stockings mgt Additional ADL Goal #2: Pt will perform bed mobility with supervision assist in preparation for seated ADL tasks.  OT Frequency: Min 1X/week   Barriers to D/C:            Co-evaluation              AM-PAC OT "6 Clicks" Daily Activity     Outcome Measure Help from another person eating meals?: None Help from another person taking care of personal grooming?: None Help from another person toileting, which includes using toliet, bedpan, or urinal?: A Little Help from another person bathing (including washing, rinsing, drying)?: A Lot Help from another person to put on and taking off regular upper body clothing?: None Help from another person to put on and taking off regular lower body clothing?: A Lot 6 Click Score: 19   End of Session    Activity Tolerance: Patient limited by pain Patient left: in bed;with call bell/phone within reach;with bed alarm set;with SCD's reapplied  OT Visit Diagnosis: Other abnormalities of gait and mobility (R26.89);Pain Pain - Right/Left: Right Pain - part of body: Hip;Leg                Time: 1540-0867 OT Time Calculation (min): 41 min Charges:  OT General Charges $OT Visit: 1 Visit OT Evaluation $OT Eval Low Complexity: 1 Low OT Treatments $Self Care/Home Management : 23-37 mins  Jeni Salles, MPH, MS, OTR/L ascom [phone] 11/05/18,  11:20 AM

## 2018-11-05 NOTE — Progress Notes (Signed)
Physical Therapy Treatment Patient Details Name: Harold Tate MRN: [id number] DOB: 1965-06-19 Today's Date: 11/05/2018    History of Present Illness admitted for acute hospitalization status post R THR, anterior approach, WBAT (11/03/18)    PT Comments    Gradual progression in gait distance and overall activity tolerance; remains generally limited by pain (though appears improved from previous date).  Excessive use of bilat UEs to assist with movement transitions and WBing throughout RW Intermittently agitated during session; easily redirectable.  Generally self-limiting in performance (due to pain) at this time. Plan to initiate stairs this PM to continue progression towards DC.    Follow Up Recommendations  Home health PT     Equipment Recommendations       Recommendations for Other Services       Precautions / Restrictions Precautions Precautions: Anterior Hip;Fall Restrictions Weight Bearing Restrictions: Yes RLE Weight Bearing: Weight bearing as tolerated    Mobility  Bed Mobility Overal bed mobility: Needs Assistance Bed Mobility: Supine to Sit     Supine to sit: Supervision     General bed mobility comments: increased time, heavy use of bedrails to complete  Transfers Overall transfer level: Needs assistance Equipment used: Rolling walker (2 wheeled) Transfers: Sit to/from Stand Sit to Stand: Min assist         General transfer comment: increased time/encouragement for all movement transitions; consistent cuing for hand/foot placement to maximize mechanics; min assist for initial lift off.  Very slow and effortful  Ambulation/Gait Ambulation/Gait assistance: Min guard Gait Distance (Feet): (65' x2) Assistive device: Rolling walker (2 wheeled)     Gait velocity interpretation: <1.31 ft/sec, indicative of household ambulator General Gait Details: step to progressing to partially step through gait pattern; heavy WBing bilat UEs.  Slow and effortful, but  gradually improved cadence with distance and continued cuing/encouragement from therapist.  Constant cuing for walker position, postural extension.   Stairs             Wheelchair Mobility    Modified Rankin (Stroke Patients Only)       Balance Overall balance assessment: Needs assistance Sitting-balance support: No upper extremity supported;Feet supported Sitting balance-Leahy Scale: Good     Standing balance support: Bilateral upper extremity supported Standing balance-Leahy Scale: Fair                              Cognition Arousal/Alertness: Awake/alert Behavior During Therapy: WFL for tasks assessed/performed Overall Cognitive Status: Within Functional Limits for tasks assessed                                 General Comments: slightly agitated at times, easily redirectable      Exercises Other Exercises Other Exercises: Toilet transfer, ambulatory with RW, cga/min assist to [facility]; heavy use of armrests and grab bar for sit/stand despite maximally elevated BSC    General Comments        Pertinent Vitals/Pain Faces Pain Scale: Hurts even more Pain Location: R hip Pain Descriptors / Indicators: Aching;Grimacing;Guarding Pain Intervention(s): Limited activity within patient's tolerance;Monitored during session;Premedicated before session;Repositioned    Home Living                      Prior Function            PT Goals (current goals can now be found in the care plan  section) Acute Rehab PT Goals Patient Stated Goal: to make the pain better PT Goal Formulation: With patient Time For Goal Achievement: 11/17/18 Potential to Achieve Goals: Good Progress towards PT goals: Progressing toward goals    Frequency    BID      PT Plan Current plan remains appropriate    Co-evaluation              AM-PAC PT "6 Clicks" Mobility   Outcome Measure  Help needed turning from your back to your side while in a  flat bed without using bedrails?: None Help needed moving from lying on your back to sitting on the side of a flat bed without using bedrails?: None Help needed moving to and from a bed to a chair (including a wheelchair)?: A Little Help needed standing up from a chair using your arms (e.g., wheelchair or bedside chair)?: A Little Help needed to walk in hospital room?: A Little Help needed climbing 3-5 steps with a railing? : A Little 6 Click Score: 20    End of Session Equipment Utilized During Treatment: Gait belt Activity Tolerance: Patient tolerated treatment well;Patient limited by pain Patient left: (seated edge of bed, OT present for evaluation; to assist with return to bed as appropriate) Nurse Communication: Mobility status PT Visit Diagnosis: Muscle weakness (generalized) (M62.81);Difficulty in walking, not elsewhere classified (R26.2);Pain Pain - Right/Left: Right Pain - part of body: Hip     Time: 0852-0949 PT Time Calculation (min) (ACUTE ONLY): 57 min  Charges:  $Gait Training: 23-37 mins $Therapeutic Activity: 23-37 mins                     Kileen Lange H. Owens Shark, PT, DPT, NCS 11/05/18, 10:52 AM [phone]

## 2018-11-05 NOTE — TOC Progression Note (Signed)
Transition of Care [facility]) - Progression Note    Patient Details  Name: Harold Tate MRN: [id number] Date of Birth: April 09, 1966  Transition of Care [facility]) CM/SW Contact  Magic Mohler, Lenice Llamas Phone Number: [phone]  11/05/2018, 11:33 AM  Clinical Narrative: OT is recommending home health OT. Clinical Education officer, museum (CSW) sent [facility] a secure message requesting home health OT order. Clinical Education officer, museum (CSW) also notified [facility] representative Helene Kelp of need for home health OT.      Expected Discharge Plan: [place] Barriers to Discharge: Continued Medical Work up  Expected Discharge Plan and Services Expected Discharge Plan: [place] In-house Referral: Clinical Social Work Discharge Planning Services: CM Consult Post Acute Care Choice: [place] arrangements for the past 2 months: Single Family Home                 DME Arranged: Bedside commode DME Agency: [employer] Date DME Agency Contacted: 11/03/18   Representative spoke with at DME Agency: Leroy Sea [place]: PT [place]: [facility] [employer] Date [place]: 11/03/18   Representative spoke with at [place]: [place] ([place]) Interventions    Readmission Risk Interventions No flowsheet data found.

## 2018-11-05 NOTE — Progress Notes (Signed)
Physical Therapy Treatment Patient Details Name: Harold Tate MRN: [id number] DOB: 1966/03/22 Today's Date: 11/05/2018    History of Present Illness 53yo male admitted for acute hospitalization status post R THR, anterior approach, WBAT (11/03/18)    PT Comments    Initiated stair training this session.  Able to complete up/down 4 steps with bilat rails, min assist +1; fair/good technique and overall stability.  Patient without rails available at home currently.  Educated in backwards approach with RW-patient very fearful/hesitant of backwards approach.  Did complete two steps  (with max encouragement) with min/mod assist +2, but poorly receptive to education regarding this technique. Insistent that he can do his way and/or can have rails/grab bar installed prior to discharge.  Will reassess and continue education next session.    Follow Up Recommendations  Home health PT     Equipment Recommendations  3in1 (PT)    Recommendations for Other Services       Precautions / Restrictions Precautions Precautions: Fall Restrictions Weight Bearing Restrictions: Yes RLE Weight Bearing: Weight bearing as tolerated    Mobility  Bed Mobility Overal bed mobility: Needs Assistance Bed Mobility: Supine to Sit;Sit to Supine     Supine to sit: Supervision Sit to supine: Min assist   General bed mobility comments: assist for R LE elevation into bed; educated in L LE hook technique (unable to move fast enough to generate momentum for successful use of hook technique)  Transfers Overall transfer level: Needs assistance Equipment used: Rolling walker (2 wheeled) Transfers: Sit to/from Stand Sit to Stand: Min assist;Min guard         General transfer comment: tends to pull/push on RW despite repetitive cuing  Ambulation/Gait Ambulation/Gait assistance: Min Gaffer (Feet): (75' x2) Assistive device: Rolling walker (2 wheeled)       General Gait Details: step  to progressing to partially step through gait pattern; heavy WBing bilat UEs.  Slow and effortful, but gradually improved cadence with distance and continued cuing/encouragement from therapist.  Constant cuing for walker position, postural extension.   Stairs Stairs: Yes Stairs assistance: Min assist Stair Management: Two rails Number of Stairs: 4 General stair comments: up/down 4 with bilat rails, min assist +1; fair/good technique and overall stability.  Patient without rails available.  Educated in backwards approach with RW-patient very fearful/hesitant of backwards approach.  Did complete two steps  (with max encouragement) with min/mod assist +2, but poorly receptive to education regarding this technique. Insistent that he can do his way and/or can have rails/grab bar installed prior to discharge.  Will reassess and continue education next session.   Wheelchair Mobility    Modified Rankin (Stroke Patients Only)       Balance Overall balance assessment: Needs assistance Sitting-balance support: Feet supported;No upper extremity supported Sitting balance-Leahy Scale: Fair Sitting balance - Comments: offset to L to unweight R hip   Standing balance support: Bilateral upper extremity supported Standing balance-Leahy Scale: Fair                              Cognition Arousal/Alertness: Awake/alert Behavior During Therapy: WFL for tasks assessed/performed Overall Cognitive Status: Within Functional Limits for tasks assessed                                        Exercises  General Comments        Pertinent Vitals/Pain Pain Assessment: Faces Faces Pain Scale: Hurts even more Pain Location: R hip Pain Descriptors / Indicators: Aching;Grimacing;Guarding Pain Intervention(s): Limited activity within patient's tolerance;Monitored during session;Repositioned    Home Living                      Prior Function            PT  Goals (current goals can now be found in the care plan section) Acute Rehab PT Goals Patient Stated Goal: to make the pain better PT Goal Formulation: With patient Time For Goal Achievement: 11/17/18 Potential to Achieve Goals: Good Progress towards PT goals: Progressing toward goals    Frequency    BID      PT Plan Current plan remains appropriate    Co-evaluation              AM-PAC PT "6 Clicks" Mobility   Outcome Measure  Help needed turning from your back to your side while in a flat bed without using bedrails?: None Help needed moving from lying on your back to sitting on the side of a flat bed without using bedrails?: None Help needed moving to and from a bed to a chair (including a wheelchair)?: A Little Help needed standing up from a chair using your arms (e.g., wheelchair or bedside chair)?: A Little Help needed to walk in hospital room?: A Little Help needed climbing 3-5 steps with a railing? : A Little 6 Click Score: 20    End of Session Equipment Utilized During Treatment: Gait belt Activity Tolerance: Patient tolerated treatment well;Patient limited by pain Patient left: in bed;with bed alarm set;with call bell/phone within reach Nurse Communication: Mobility status PT Visit Diagnosis: Muscle weakness (generalized) (M62.81);Difficulty in walking, not elsewhere classified (R26.2);Pain Pain - Right/Left: Right Pain - part of body: Hip     Time: 0350-0938 PT Time Calculation (min) (ACUTE ONLY): 64 min  Charges:  $Gait Training: 23-37 mins $Therapeutic Activity: 23-37 mins                     Zeynab Klett H. Owens Shark, PT, DPT, NCS 11/05/18, 4:12 PM [phone]

## 2018-11-05 NOTE — Discharge Instructions (Signed)
ANTERIOR APPROACH TOTAL HIP REPLACEMENT POSTOPERATIVE DIRECTIONS   Hip Rehabilitation, Guidelines Following Surgery  The results of a hip operation are greatly improved after range of motion and muscle strengthening exercises. Follow all safety measures which are given to protect your hip. If any of these exercises cause increased pain or swelling in your joint, decrease the amount until you are comfortable again. Then slowly increase the exercises. Call your caregiver if you have problems or questions.   HOME CARE INSTRUCTIONS  Remove items at home which could result in a fall. This includes throw rugs or furniture in walking pathways.   ICE to the affected hip every three hours for 30 minutes at a time and then as needed for pain and swelling.  Continue to use ice on the hip for pain and swelling from surgery. You may notice swelling that will progress down to the foot and ankle.  This is normal after surgery.  Elevate the leg when you are not up walking on it.    Continue to use the breathing machine which will help keep your temperature down.  It is common for your temperature to cycle up and down following surgery, especially at night when you are not up moving around and exerting yourself.  The breathing machine keeps your lungs expanded and your temperature down.  Do not place pillow under knee, focus on keeping the knee straight while resting  DIET You may resume your previous home diet once your are discharged from the hospital.  DRESSING / WOUND CARE / SHOWERING Please remove provena negative pressure dressing on 11/13/2018 and apply honey comb dressing. Keep dressing clean and dry at all times.  ACTIVITY Walk with your walker as instructed. Use walker as long as suggested by your caregivers. Avoid periods of inactivity such as sitting longer than an hour when not asleep. This helps prevent blood clots.  You may resume a sexual relationship in one month or when given the OK by  your doctor.  You may return to work once you are cleared by your doctor.  Do not drive a car for 6 weeks or until released by you surgeon.  Do not drive while taking narcotics.  WEIGHT BEARING Weight bearing as tolerated. Use walker/cane as needed for at least 4 weeks post op.  POSTOPERATIVE CONSTIPATION PROTOCOL Constipation - defined medically as fewer than three stools per week and severe constipation as less than one stool per week.  One of the most common issues patients have following surgery is constipation.  Even if you have a regular bowel pattern at home, your normal regimen is likely to be disrupted due to multiple reasons following surgery.  Combination of anesthesia, postoperative narcotics, change in appetite and fluid intake all can affect your bowels.  In order to avoid complications following surgery, here are some recommendations in order to help you during your recovery period.  Colace (docusate) - Pick up an over-the-counter form of Colace or another stool softener and take twice a day as long as you are requiring postoperative pain medications.  Take with a full glass of water daily.  If you experience loose stools or diarrhea, hold the colace until you stool forms back up.  If your symptoms do not get better within 1 week or if they get worse, check with your doctor.  Dulcolax (bisacodyl) - Pick up over-the-counter and take as directed by the product packaging as needed to assist with the movement of your bowels.  Take with a full  glass of water.  Use this product as needed if not relieved by Colace only.  ° °MiraLax (polyethylene glycol) - Pick up over-the-counter to have on hand.  MiraLax is a solution that will increase the amount of water in your bowels to assist with bowel movements.  Take as directed and can mix with a glass of water, juice, soda, coffee, or tea.  Take if you go more than two days without a movement. °Do not use MiraLax more than once per day. Call your  doctor if you are still constipated or irregular after using this medication for 7 days in a row. ° °If you continue to have problems with postoperative constipation, please contact the office for further assistance and recommendations.  If you experience "the worst abdominal pain ever" or develop nausea or vomiting, please contact the office immediatly for further recommendations for treatment. ° °ITCHING ° If you experience itching with your medications, try taking only a single pain pill, or even half a pain pill at a time.  You can also use Benadryl over the counter for itching or also to help with sleep.  ° °TED HOSE STOCKINGS °Wear the elastic stockings on both legs for six weeks following surgery during the day but you may remove then at night for sleeping. ° °MEDICATIONS °See your medication summary on the “After Visit Summary” that the nursing staff will review with you prior to discharge.  You may have some home medications which will be placed on hold until you complete the course of blood thinner medication.  It is important for you to complete the blood thinner medication as prescribed by your surgeon.  Continue your approved medications as instructed at time of discharge. ° °PRECAUTIONS °If you experience chest pain or shortness of breath - call 911 immediately for transfer to the hospital emergency department.  °If you develop a fever greater that 101 F, purulent drainage from wound, increased redness or drainage from wound, foul odor from the wound/dressing, or calf pain - CONTACT YOUR SURGEON.   °                                                °FOLLOW-UP APPOINTMENTS °Make sure you keep all of your appointments after your operation with your surgeon and caregivers. You should call the office at the above phone number and make an appointment for approximately two weeks after the date of your surgery or on the date instructed by your surgeon outlined in the "After Visit Summary". ° °RANGE OF MOTION  AND STRENGTHENING EXERCISES  °These exercises are designed to help you keep full movement of your hip joint. Follow your caregiver's or physical therapist's instructions. Perform all exercises about fifteen times, three times per day or as directed. Exercise both hips, even if you have had only one joint replacement. These exercises can be done on a training (exercise) mat, on the floor, on a table or on a bed. Use whatever works the best and is most comfortable for you. Use music or television while you are exercising so that the exercises are a pleasant break in your day. This will make your life better with the exercises acting as a break in routine you can look forward to.  °Lying on your back, slowly slide your foot toward your buttocks, raising your knee up off the floor. Then slowly   slide your foot back down until your leg is straight again.  Lying on your back spread your legs as far apart as you can without causing discomfort.  Lying on your side, raise your upper leg and foot straight up from the floor as far as is comfortable. Slowly lower the leg and repeat.  Lying on your back, tighten up the muscle in the front of your thigh (quadriceps muscles). You can do this by keeping your leg straight and trying to raise your heel off the floor. This helps strengthen the largest muscle supporting your knee.  Lying on your back, tighten up the muscles of your buttocks both with the legs straight and with the knee bent at a comfortable angle while keeping your heel on the floor.   IF YOU ARE TRANSFERRED TO A SKILLED REHAB FACILITY If the patient is transferred to a skilled rehab facility following release from the hospital, a list of the current medications will be sent to the facility for the patient to continue.  When discharged from the skilled rehab facility, please have the facility set up the patient's [place] prior to being released. Also, the skilled facility will be responsible  for providing the patient with their medications at time of release from the facility to include their pain medication, the muscle relaxants, and their blood thinner medication. If the patient is still at the rehab facility at time of the two week follow up appointment, the skilled rehab facility will also need to assist the patient in arranging follow up appointment in our office and any transportation needs.  MAKE SURE YOU:  Understand these instructions.  Get help right away if you are not doing well or get worse.    Pick up stool softner and laxative for home use following surgery while on pain medications. Continue to use ice for pain and swelling after surgery. Do not use any lotions or creams on the incision until instructed by your surgeon.

## 2018-11-05 NOTE — TOC Progression Note (Signed)
Transition of Care [facility]) - Progression Note    Patient Details  Name: Harold Tate MRN: [id number] Date of Birth: 1965-11-11  [facility] of [facility]) CM/SW Contact  Jovanka Westgate, Lenice Llamas Phone Number: [phone]  11/05/2018, 10:17 AM  Clinical Narrative: Per PT patient is interested in paying out of pocket for a bariatric bedside commode. Per [place] DME agency representative it will be $98 and shipped to patient's house. Clinical Social Worker (CSW) made patient aware of above. Per patient he will keep his regular BSC he has in the room. CSW will continue to follow and assist as needed.       Expected Discharge Plan: [place] Barriers to Discharge: Continued Medical Work up  Expected Discharge Plan and Services Expected Discharge Plan: [place] In-house Referral: Clinical Social Work Discharge Planning Services: CM Consult Post Acute Care Choice: [place] arrangements for the past 2 months: Single Family Home                 DME Arranged: Bedside commode DME Agency: [employer] Date DME Agency Contacted: 11/03/18   Representative spoke with at DME Agency: Leroy Sea [place]: PT [place]: [facility] [employer] Date [place]: 11/03/18   Representative spoke with at [place]: [place] ([place]) Interventions    Readmission Risk Interventions No flowsheet data found.

## 2018-11-05 NOTE — Progress Notes (Signed)
   Subjective: 2 Days Post-Op Procedure(s) (LRB): RIGHT TOTAL HIP ARTHROPLASTY ANTERIOR APPROACH (Right) Patient reports pain as moderate.  Pain much improved from yesterday. Patient is well, and has had no acute complaints or problems Denies any CP, SOB, ABD pain. We will continue therapy today.  Plan is to go Home after hospital stay.  Objective: Vital signs in last 24 hours: Temp:  [98.1 F (36.7 C)-99.4 F (37.4 C)] 99 F (37.2 C) (07/09 0756) Pulse Rate:  [92-101] 101 (07/09 0756) Resp:  [18-20] 20 (07/09 0756) BP: (120-131)/(72-90) 120/72 (07/09 0756) SpO2:  [90 %-91 %] 90 % (07/09 0756)  Intake/Output from previous day: 07/08 0701 - 07/09 0700 In: -  Out: 0814 [Urine:1775] Intake/Output this shift: No intake/output data recorded.  Recent Labs    11/03/18 1430 11/04/18 0401  HGB 13.8 13.3   Recent Labs    11/03/18 1430 11/04/18 0401  WBC 9.3 7.2  RBC 4.04* 3.81*  HCT 40.5 37.8*  PLT 162 146*   Recent Labs    11/03/18 1430 11/04/18 0401  NA  --  132*  K  --  3.9  CL  --  100  CO2  --  25  BUN  --  7  CREATININE 0.72 0.59*  GLUCOSE  --  161*  CALCIUM  --  8.1*   No results for input(s): LABPT, INR in the last 72 hours.  EXAM General - Patient is Alert, Appropriate and Oriented Extremity - Neurovascular intact Sensation intact distally Intact pulses distally Dorsiflexion/Plantar flexion intact Incision: dressing C/D/I and moderate drainage No cellulitis present Compartment soft Dressing - 300 cc drainage hemovac Motor Function - intact, moving foot and toes well on exam.   Past Medical History:  Diagnosis Date  . Anxiety   . Arthritis   . Diabetes mellitus without complication ([place])   . Dyspnea   . Hip pain   . Hypertension   . Raynaud's syndrome   . Sleep apnea     Assessment/Plan:   2 Days Post-Op Procedure(s) (LRB): RIGHT TOTAL HIP ARTHROPLASTY ANTERIOR APPROACH (Right) Active Problems:   Status post total hip replacement,  right  Estimated body mass index is 32.32 kg/m as calculated from the following:   Height as of this encounter: 6\' 1"  (1.854 m).   Weight as of this encounter: 111.1 kg. Advance diet Up with therapy  Needs bowel movement, add suppository to regimen Vital signs stable Labs stable Encourage incentive spirometer Care management to assist with discharge to home with home health PT Friday  DVT Prophylaxis - Lovenox, TED hose and SCDs Weight-Bearing as tolerated to right leg   T. Rachelle Hora, PA-C [place] 11/05/2018, 8:05 AM

## 2018-11-05 NOTE — Progress Notes (Signed)
OT Cancellation Note  Patient Details Name: AVRUM KIMBALL MRN: [id number] DOB: 07-11-1965   Cancelled Treatment:    Reason Eval/Treat Not Completed: Other (comment). Order received, chart reviewed. Pt working with PT upon attempt. Will re-attempt OT evaluation at later date/time as pt is available and medically appropriate.  Jeni Salles, MPH, MS, OTR/L ascom [phone] 11/05/18, 9:34 AM

## 2018-11-06 NOTE — Progress Notes (Signed)
Clinical Education officer, museum (CSW) confirmed with patient at the medical mall entrance that he has a walker at home and does not need one.   [employer], LCSW [phone]

## 2018-11-06 NOTE — TOC Transition Note (Signed)
Transition of Care [facility]) - CM/SW Discharge Note   Patient Details  Name: Harold Tate MRN: [id number] Date of Birth: 25-Nov-1965  [facility] CM/SW Contact:  Chantilly Linskey, Lenice Llamas Phone Number: [phone]  11/06/2018, 8:32 AM   Clinical Narrative: Clinical Social Worker (CSW) notified Helene Kelp [facility] home health represntative that patient will D/C home today. Patient's bedside commode has been delivered to patient's room. Patient is aware of his Lovenox price $9.29. Please reconsult if future social work needs arise. CSW signing off.      Final next level of care: [place] Barriers to Discharge: Barriers Resolved   Patient Goals and CMS Choice Patient states their goals for this hospitalization and ongoing recovery are:: Pain control CMS Medicare.gov Compare Post Acute Care list provided to:: (Patient's surgeon's office set up home health with [facility] prior to hospital stay.) Choice offered to / list presented to : Patient  Discharge Placement                       Discharge Plan and Services In-house Referral: Clinical Social Work Discharge Planning Services: CM Consult Post Acute Care Choice: Home Health          DME Arranged: Bedside commode DME Agency: [employer] Date DME Agency Contacted: 11/03/18   Representative spoke with at DME Agency: [place]: PT, OT [place] Agency: [facility] (formerly [employer]) Date [place]: 11/06/18   Representative spoke with at [place]: [place] ([place]) Interventions     Readmission Risk Interventions No flowsheet data found.

## 2018-11-06 NOTE — Progress Notes (Signed)
   Subjective: 3 Days Post-Op Procedure(s) (LRB): RIGHT TOTAL HIP ARTHROPLASTY ANTERIOR APPROACH (Right) Patient reports pain as mild.  Pain much improved from yesterday. Patient is well, and has had no acute complaints or problems Denies any CP, SOB, ABD pain. We will continue therapy today.  Plan is to go Home after hospital stay.  Objective: Vital signs in last 24 hours: Temp:  [98.9 F (37.2 C)-99.3 F (37.4 C)] 99.3 F (37.4 C) (07/10 0735) Pulse Rate:  [92-100] 92 (07/10 0735) Resp:  [17-18] 17 (07/10 0735) BP: (102-126)/(72-81) 115/81 (07/10 0735) SpO2:  [91 %-92 %] 92 % (07/10 0735)  Intake/Output from previous day: 07/09 0701 - 07/10 0700 In: 240 [P.O.:240] Out: 925 [Urine:925] Intake/Output this shift: Total I/O In: -  Out: 465 [Urine:465]  Recent Labs    11/03/18 1430 11/04/18 0401  HGB 13.8 13.3   Recent Labs    11/03/18 1430 11/04/18 0401  WBC 9.3 7.2  RBC 4.04* 3.81*  HCT 40.5 37.8*  PLT 162 146*   Recent Labs    11/03/18 1430 11/04/18 0401  NA  --  132*  K  --  3.9  CL  --  100  CO2  --  25  BUN  --  7  CREATININE 0.72 0.59*  GLUCOSE  --  161*  CALCIUM  --  8.1*   No results for input(s): LABPT, INR in the last 72 hours.  EXAM General - Patient is Alert, Appropriate and Oriented Extremity - Neurovascular intact Sensation intact distally Intact pulses distally Dorsiflexion/Plantar flexion intact Incision: dressing C/D/I and moderate drainage No cellulitis present Compartment soft Dressing - 300 cc drainage hemovac Motor Function - intact, moving foot and toes well on exam.   Past Medical History:  Diagnosis Date  . Anxiety   . Arthritis   . Diabetes mellitus without complication ([place])   . Dyspnea   . Hip pain   . Hypertension   . Raynaud's syndrome   . Sleep apnea     Assessment/Plan:   3 Days Post-Op Procedure(s) (LRB): RIGHT TOTAL HIP ARTHROPLASTY ANTERIOR APPROACH (Right) Active Problems:   Status post total hip  replacement, right  Estimated body mass index is 32.32 kg/m as calculated from the following:   Height as of this encounter: 6\' 1"  (1.854 m).   Weight as of this encounter: 111.1 kg. Advance diet Up with therapy  Vital signs stable Labs stable Encourage incentive spirometer Care management to assist with discharge to home with home health PT today  DVT Prophylaxis - Lovenox, TED hose and SCDs Weight-Bearing as tolerated to right leg   T. Rachelle Hora, PA-C [place] 11/06/2018, 8:01 AM

## 2018-11-06 NOTE — Progress Notes (Signed)
Physical Therapy Treatment Patient Details Name: Harold Tate MRN: [id number] DOB: [birth year] Today's Date: 11/06/2018    History of Present Illness 53yo male admitted for acute hospitalization status post R THR, anterior approach, WBAT (11/03/18)    PT Comments    Noted progression in mobility and overall activity tolerance this date; completing full lap around nursing station (200') with RW, close sup and stairs (up/down 4 with L ascending rail), cga.  Patient insistent that he will have rail in place for use at discharge and refuses to entertain education or trial of any other stair negotiation technique.  Patient remains generally resistant to cuing and education, insistent on doing things his own way; generally impulsive, and often makes tasks more difficult than necessary for himself.  Despite behavior, patient demonstrating adequate progression for safe discharge home; has necessary equipment available and follow up services in place. No additional questions or concerns at this time.    Follow Up Recommendations  Home health PT     Equipment Recommendations  3in1 (PT)    Recommendations for Other Services       Precautions / Restrictions Precautions Precautions: Fall;Anterior Hip Restrictions Weight Bearing Restrictions: Yes RLE Weight Bearing: Weight bearing as tolerated    Mobility  Bed Mobility Overal bed mobility: Needs Assistance Bed Mobility: Supine to Sit;Sit to Supine     Supine to sit: Modified independent (Device/Increase time) Sit to supine: Min assist   General bed mobility comments: assist for R LE management into bed  Transfers Overall transfer level: Needs assistance Equipment used: Rolling walker (2 wheeled) Transfers: Sit to/from Stand Sit to Stand: Supervision         General transfer comment: improved speed of movement transition  Ambulation/Gait Ambulation/Gait assistance: Supervision Gait Distance (Feet): 200 Feet Assistive device:  Rolling walker (2 wheeled)       General Gait Details: narrowed BOS, decreased step height/length bilat; heavy WBing bilat UEs with forward flexed posture.  Increased cadence-mod impulsivity due to internal frustration with situation, cuing.   Stairs Stairs: Yes Stairs assistance: Min guard Stair Management: One rail Left Number of Stairs: 4 General stair comments: step to asecnding forward with L LE, descending backward with R LE.  Fair/good stability in modified R LE SLS.  Again, generally impulsive and limited receptiveness to cuing.  Insistent that he will have rail in place prior to discharge; refuses to entertain any other option   Wheelchair Mobility    Modified Rankin (Stroke Patients Only)       Balance Overall balance assessment: Needs assistance Sitting-balance support: No upper extremity supported;Feet supported Sitting balance-Leahy Scale: Good       Standing balance-Leahy Scale: Fair                              Cognition   Behavior During Therapy: [facility] for tasks assessed/performed Overall Cognitive Status: Within Functional Limits for tasks assessed                                 General Comments: slightly agitated at times, easily redirectable      Exercises Other Exercises Other Exercises: Verbally reviewed technique for car transfer; patient voiced understanding, stating that was 'common sense' and 'the way I was doing it before' Other Exercises: Issued handout with supine LE therex for use as HEP.  Verbally reviewed therex and recommended frequency, repetition.  Patient voiced understanding.    General Comments        Pertinent Vitals/Pain Pain Assessment: Faces Faces Pain Scale: Hurts even more Pain Location: R hip Pain Descriptors / Indicators: Aching;Grimacing;Guarding Pain Intervention(s): Limited activity within patient's tolerance;Monitored during session;Repositioned;Premedicated before session    Home  Living                      Prior Function            PT Goals (current goals can now be found in the care plan section) Acute Rehab PT Goals Patient Stated Goal: to make the pain better PT Goal Formulation: With patient Time For Goal Achievement: 11/17/18 Potential to Achieve Goals: Good Progress towards PT goals: Progressing toward goals    Frequency    BID      PT Plan Current plan remains appropriate    Co-evaluation              AM-PAC PT "6 Clicks" Mobility   Outcome Measure  Help needed turning from your back to your side while in a flat bed without using bedrails?: None Help needed moving from lying on your back to sitting on the side of a flat bed without using bedrails?: None Help needed moving to and from a bed to a chair (including a wheelchair)?: A Little Help needed standing up from a chair using your arms (e.g., wheelchair or bedside chair)?: A Little Help needed to walk in hospital room?: A Little Help needed climbing 3-5 steps with a railing? : A Little 6 Click Score: 20    End of Session Equipment Utilized During Treatment: Gait belt Activity Tolerance: Patient tolerated treatment well Patient left: in bed;with call bell/phone within reach Nurse Communication: Mobility status PT Visit Diagnosis: Muscle weakness (generalized) (M62.81);Difficulty in walking, not elsewhere classified (R26.2);Pain Pain - Right/Left: Right Pain - part of body: Hip     Time: 9563-8756 PT Time Calculation (min) (ACUTE ONLY): 39 min  Charges:  $Gait Training: 23-37 mins $Therapeutic Activity: 8-22 mins                     Pamila Mendibles H. Owens Shark, PT, DPT, NCS 11/06/18, 9:46 AM [phone]

## 2018-11-06 NOTE — Progress Notes (Signed)
Patient is being discharged to home today with [facility]. DC & Rx instructions given and patient acknowledged understanding. IV removed. NT will help patient gather belongings and prepare for transport via private vehicle with girlfriend.

## 2018-11-18 ENCOUNTER — Encounter: Payer: Self-pay | Admitting: Family Medicine

## 2018-11-19 ENCOUNTER — Encounter: Payer: Self-pay | Admitting: Family Medicine

## 2018-11-19 ENCOUNTER — Ambulatory Visit (INDEPENDENT_AMBULATORY_CARE_PROVIDER_SITE_OTHER): Payer: BLUE CROSS/BLUE SHIELD | Admitting: Family Medicine

## 2018-11-19 ENCOUNTER — Other Ambulatory Visit: Payer: Self-pay

## 2018-11-19 DIAGNOSIS — E7800 Pure hypercholesterolemia, unspecified: Secondary | ICD-10-CM

## 2018-11-19 DIAGNOSIS — E1169 Type 2 diabetes mellitus with other specified complication: Secondary | ICD-10-CM | POA: Diagnosis not present

## 2018-11-19 DIAGNOSIS — I10 Essential (primary) hypertension: Secondary | ICD-10-CM

## 2018-11-19 DIAGNOSIS — F411 Generalized anxiety disorder: Secondary | ICD-10-CM | POA: Diagnosis not present

## 2018-11-19 DIAGNOSIS — F419 Anxiety disorder, unspecified: Secondary | ICD-10-CM | POA: Diagnosis not present

## 2018-11-19 DIAGNOSIS — Z96641 Presence of right artificial hip joint: Secondary | ICD-10-CM

## 2018-11-19 DIAGNOSIS — R37 Sexual dysfunction, unspecified: Secondary | ICD-10-CM

## 2018-11-19 MED ORDER — CLONAZEPAM 0.5 MG PO TABS: ORAL_TABLET | ORAL | 3 refills | Status: DC

## 2018-11-19 NOTE — Assessment & Plan Note (Signed)
Checking labs

## 2018-11-19 NOTE — Assessment & Plan Note (Signed)
Pending A1c current diet control

## 2018-11-19 NOTE — Progress Notes (Signed)
There were no vitals taken for this visit.   Subjective:    Patient ID: Harold Tate, male    DOB: Jan 07, 1966, 53 y.o.   MRN: [id number]  HPI: Harold Tate is a 53 y.o. male  Med check Discussed with patient having problems with lack of sexual desire and some function issues.  Has had hip replacement and that is doing well over the last 2 weeks is tapering down on medications and finishing up doing well. Sexual issues been ongoing for several years but just seem to be getting worse is ready to do something about it now that is leg is stronger.  Reviewed medication not taking Benzapril for blood pressure as his blood pressures been controlled. Has been diet-controlled diabetes. Chronic anxiety has been ongoing takes clonazepam 0.53 times a day on a regular basis with no increased use and no decrease use over the years has not asked for early refills. Pain medication taking through orthopedics and is tapering off.   Relevant past medical, surgical, family and social history reviewed and updated as indicated. Interim medical history since our last visit reviewed. Allergies and medications reviewed and updated.  Review of Systems  Constitutional: Negative.   Respiratory: Negative.   Cardiovascular: Negative.     Per HPI unless specifically indicated above     Objective:    There were no vitals taken for this visit.  Wt Readings from Last 3 Encounters:  11/03/18 245 lb (111.1 kg)  10/27/18 245 lb (111.1 kg)  05/05/18 244 lb (110.7 kg)    Physical Exam  Results for orders placed or performed during the hospital encounter of 11/03/18  CBC  Result Value Ref Range   WBC 9.3 4.0 - 10.5 K/uL   RBC 4.04 (L) 4.22 - 5.81 MIL/uL   Hemoglobin 13.8 13.0 - 17.0 g/dL   HCT 40.5 39.0 - 52.0 %   MCV 100.2 (H) 80.0 - 100.0 fL   MCH 34.2 (H) 26.0 - 34.0 pg   MCHC 34.1 30.0 - 36.0 g/dL   RDW 13.1 11.5 - 15.5 %   Platelets 162 150 - 400 K/uL   nRBC 0.0 0.0 - 0.2 %  Creatinine, serum   Result Value Ref Range   Creatinine, Ser 0.72 0.61 - 1.24 mg/dL   GFR calc non Af Amer >60 >60 mL/min   GFR calc Af Amer >60 >60 mL/min  CBC  Result Value Ref Range   WBC 7.2 4.0 - 10.5 K/uL   RBC 3.81 (L) 4.22 - 5.81 MIL/uL   Hemoglobin 13.3 13.0 - 17.0 g/dL   HCT 37.8 (L) 39.0 - 52.0 %   MCV 99.2 80.0 - 100.0 fL   MCH 34.9 (H) 26.0 - 34.0 pg   MCHC 35.2 30.0 - 36.0 g/dL   RDW 12.9 11.5 - 15.5 %   Platelets 146 (L) 150 - 400 K/uL   nRBC 0.0 0.0 - 0.2 %  Basic metabolic panel  Result Value Ref Range   Sodium 132 (L) 135 - 145 mmol/L   Potassium 3.9 3.5 - 5.1 mmol/L   Chloride 100 98 - 111 mmol/L   CO2 25 22 - 32 mmol/L   Glucose, Bld 161 (H) 70 - 99 mg/dL   BUN 7 6 - 20 mg/dL   Creatinine, Ser 0.59 (L) 0.61 - 1.24 mg/dL   Calcium 8.1 (L) 8.9 - 10.3 mg/dL   GFR calc non Af Amer >60 >60 mL/min   GFR calc Af Amer >60 >60 mL/min  Anion gap 7 5 - 15  ABO/Rh  Result Value Ref Range   ABO/RH(D)      O POS Performed at [facility], [address], [place], [place] [address]   Surgical pathology  Result Value Ref Range   SURGICAL PATHOLOGY      Surgical Pathology CASE: [id number] PATIENT: Jammal Earleen Newport Surgical Pathology Report     SPECIMEN SUBMITTED: A. Femoral head, right  CLINICAL HISTORY: None provided  PRE-OPERATIVE DIAGNOSIS: Avascular necrosis of right hip  POST-OPERATIVE DIAGNOSIS: Same as pre-op     DIAGNOSIS: A.  FEMORAL HEAD, RIGHT, ARTHROPLASTY: - AVASCULAR NECROSIS.  GROSS DESCRIPTION: A. Labeled: Right femoral head Received: Formalin Size of specimen:      Head - 5.0 x 5.0 x 4.7 cm      Neck - 4.5 x 3.7 x 2.0 cm      Additional tissue: Absent Articular surface: The articular surface displays a 5.2 x 3.5 cm focal area of softening with the articular surface being freely movable and detached from the underlying bone parenchyma.  The remaining articular surface is tan-yellow with multifocal areas of slight granulation. Cut  surface: The bone parenchyma underlying the freely movable articular surface is softened with a pale-tan discoloration (suspicious for necrosis).  The re maining bone parenchyma is tan-yellow and otherwise grossly unremarkable. Other findings: The surgical resection margin is tan-pink with a clean cut.  Block summary: 1-2 - representative sections (following decalcification)    Final Diagnosis performed by Betsy Pries, MD.   Electronically signed 11/04/2018 4:17:34PM The electronic signature indicates that the named Attending Pathologist has evaluated the specimen  Technical component performed at [facility], [address], [place], [place] [address] Lab: [phone] Dir: Rush Farmer, MD, MMM  Professional component performed at [facility], [facility], [place], [place], [place] [address] Lab: [phone] Dir: Dellia Nims. Rubinas, MD       Assessment & Plan:   Problem List Items Addressed This Visit      Cardiovascular and Mediastinum   Benign hypertension (Chronic)    Currently not taking any medication diet-controlled with good control      Relevant Orders   Comprehensive metabolic panel   CBC with Differential/Platelet   TSH   Urinalysis, Routine w reflex microscopic   PSA     Endocrine   Diabetes mellitus associated with hormonal etiology ([place])    Pending A1c current diet control      Relevant Orders   CBC with Differential/Platelet   TSH   Urinalysis, Routine w reflex microscopic   PSA   Bayer DCA Hb A1c Waived     Other   Generalized anxiety disorder (Chronic)   Relevant Medications   clonazePAM (KLONOPIN) 0.5 MG tablet   Hypercholesterolemia    Checking labs      Relevant Orders   Lipid panel   Anxiety    Chronic anxiety treated with clonazepam steady use with no changes no early refills      Status post total hip replacement, right    Recovering well from surgery      Sexual dysfunction    Discuss medication and usage  will check labs and urology referral      Relevant Orders   Comprehensive metabolic panel   CBC with Differential/Platelet   TSH   Urinalysis, Routine w reflex microscopic   PSA   Testosterone     Telemedicine using audio/video telecommunications for a synchronous communication visit. Today's visit due to COVID-19 isolation precautions I  connected with and verified that I am speaking with the correct person using two identifiers.   I discussed the limitations, risks, security and privacy concerns of performing an evaluation and management service by telecommunication and the availability of in person appointments. I also discussed with the patient that there may be a patient responsible charge related to this service. The patient expressed understanding and agreed to proceed. The patient's location is home. I am at home.   I discussed the assessment and treatment plan with the patient. The patient was provided an opportunity to ask questions and all were answered. The patient agreed with the plan and demonstrated an understanding of the instructions.   The patient was advised to call back or seek an in-person evaluation if the symptoms worsen or if the condition fails to improve as anticipated.   I provided 21+ minutes of time during this encounter.  Follow up plan: Return in about 4 weeks (around 12/17/2018).

## 2018-11-19 NOTE — Assessment & Plan Note (Signed)
Currently not taking any medication diet-controlled with good control

## 2018-11-19 NOTE — Assessment & Plan Note (Signed)
Discuss medication and usage will check labs and urology referral

## 2018-11-19 NOTE — Assessment & Plan Note (Signed)
Recovering well from surgery

## 2018-11-19 NOTE — Assessment & Plan Note (Signed)
Chronic anxiety treated with clonazepam steady use with no changes no early refills

## 2018-11-25 ENCOUNTER — Ambulatory Visit: Payer: Self-pay | Admitting: Licensed Clinical Social Worker

## 2018-11-25 DIAGNOSIS — F411 Generalized anxiety disorder: Secondary | ICD-10-CM

## 2018-11-25 NOTE — Chronic Care Management (AMB) (Signed)
  Chronic Care Management    Clinical Social Work General Note  11/25/2018 Name: Harold Tate MRN: [id number] DOB: 1966-04-07  [name] is a 53 y.o. year old male who is a primary care patient of Crissman, Jeannette How, MD. The CCM was consulted to assist the patient with Mental Health Counseling and Resources.   Mr. [name] was given information about Chronic Care Management services today including:  1. CCM service includes personalized support from designated clinical staff supervised by his physician, including individualized plan of care and coordination with other care providers 2. 24/7 contact phone numbers for assistance for urgent and routine care needs. 3. Service will only be billed when office clinical staff spend 20 minutes or more in a month to coordinate care. 4. Only one practitioner may furnish and bill the service in a calendar month. 5. The patient may stop CCM services at any time (effective at the end of the month) by phone call to the office staff. 6. The patient will be responsible for cost sharing (co-pay) of up to 20% of the service fee (after annual deductible is met).  Patient did not agree to services and wishes to consider information provided before deciding about enrollment in care management services.    LCSW spoke with patient and received HIPPA verifications successfully. Patient admits to experiencing ongoing anxiety symptoms but does not want mental health resources or support at this time. Patient was provided CCM program contact information in the case that he changes his mind.   Review of patient status, including review of consultants reports, relevant laboratory and other test results, and collaboration with appropriate care team members and the patient's provider was performed as part of comprehensive patient evaluation and provision of chronic care management services.    Follow Up Plan: Client will contact CCM providers in the future if case management  needs arise    Eula Fried, BSW, MSW, [email or web address] Phone: [phone]

## 2018-12-01 LAB — SURGICAL PATHOLOGY

## 2018-12-21 ENCOUNTER — Ambulatory Visit: Payer: BLUE CROSS/BLUE SHIELD | Admitting: Family Medicine

## 2018-12-21 ENCOUNTER — Other Ambulatory Visit: Payer: Self-pay

## 2019-01-11 ENCOUNTER — Other Ambulatory Visit: Payer: Self-pay

## 2019-01-11 ENCOUNTER — Encounter: Payer: Self-pay | Admitting: Family Medicine

## 2019-01-11 ENCOUNTER — Ambulatory Visit (INDEPENDENT_AMBULATORY_CARE_PROVIDER_SITE_OTHER): Payer: BLUE CROSS/BLUE SHIELD | Admitting: Family Medicine

## 2019-01-11 DIAGNOSIS — F321 Major depressive disorder, single episode, moderate: Secondary | ICD-10-CM | POA: Diagnosis not present

## 2019-01-11 MED ORDER — FLUOXETINE HCL 20 MG PO TABS: 20.0000 mg | ORAL_TABLET | Freq: Every day | ORAL | 1 refills | Status: DC

## 2019-01-11 NOTE — Assessment & Plan Note (Signed)
Discussed depression medication and issues concerned about increased energy and sleep if so we will need to stop medications will follow-up in 2 weeks to assess fluoxetine 20 mg

## 2019-01-11 NOTE — Progress Notes (Signed)
   There were no vitals taken for this visit.   Subjective:    Patient ID: Harold Tate, male    DOB: [birth year], 53 y.o.   MRN: [id number]  HPI: Harold Tate is a 53 y.o. male  Med check Discussed with patient doing well from hip replacement surgery is recovering has another hip scheduled sometime this fall.  Blood pressure anxiety stable cholesterol stable sexual dysfunction has improved. Patient's real issues today are worsening depression with increased anxiety poor sleep loss of interest in usual things no energy sad blue tearful with no suicidal ideation.  Relevant past medical, surgical, family and social history reviewed and updated as indicated. Interim medical history since our last visit reviewed. Allergies and medications reviewed and updated.  Review of Systems  Constitutional: Negative.   Respiratory: Negative.   Cardiovascular: Negative.     Per HPI unless specifically indicated above     Objective:    There were no vitals taken for this visit.  Wt Readings from Last 3 Encounters:  11/03/18 245 lb (111.1 kg)  10/27/18 245 lb (111.1 kg)  05/05/18 244 lb (110.7 kg)    Physical Exam      Assessment & Plan:   Problem List Items Addressed This Visit      Other   Depression, major, single episode, moderate ([place])    Discussed depression medication and issues concerned about increased energy and sleep if so we will need to stop medications will follow-up in 2 weeks to assess fluoxetine 20 mg      Relevant Medications   FLUoxetine (PROZAC) 20 MG tablet       Follow up plan: Return in about 2 weeks (around 01/25/2019) for Depression recheck.

## 2019-02-04 ENCOUNTER — Other Ambulatory Visit: Payer: Self-pay

## 2019-02-04 ENCOUNTER — Encounter
Admission: RE | Admit: 2019-02-04 | Discharge: 2019-02-04 | Disposition: A | Discharge status: Home / Self Care | Payer: BLUE CROSS/BLUE SHIELD | Source: Ambulatory Visit | Attending: Orthopedic Surgery | Admitting: Orthopedic Surgery

## 2019-02-04 DIAGNOSIS — Z01812 Encounter for preprocedural laboratory examination: Secondary | ICD-10-CM | POA: Diagnosis not present

## 2019-02-04 LAB — BASIC METABOLIC PANEL
Anion gap: 9 (ref 5–15)
BUN: 8 mg/dL (ref 6–20)
CO2: 27 mmol/L (ref 22–32)
Calcium: 9.2 mg/dL (ref 8.9–10.3)
Chloride: 101 mmol/L (ref 98–111)
Creatinine, Ser: 0.78 mg/dL (ref 0.61–1.24)
GFR calc Af Amer: >60 mL/min (ref >60)
GFR calc non Af Amer: >60 mL/min (ref >60)
Glucose, Bld: 157 mg/dL — ABNORMAL HIGH (ref 70–99)
Potassium: 4.3 mmol/L (ref 3.5–5.1)
Sodium: 137 mmol/L (ref 135–145)

## 2019-02-04 LAB — CBC
HCT: 49.6 % (ref 39.0–52.0)
Hemoglobin: 17.2 g/dL — ABNORMAL HIGH (ref 13.0–17.0)
MCH: 34.1 pg — ABNORMAL HIGH (ref 26.0–34.0)
MCHC: 34.7 g/dL (ref 30.0–36.0)
MCV: 98.4 fL (ref 80.0–100.0)
Platelets: 229 10*3/uL (ref 150–400)
RBC: 5.04 MIL/uL (ref 4.22–5.81)
RDW: 13.7 % (ref 11.5–15.5)
WBC: 6.2 10*3/uL (ref 4.0–10.5)
nRBC: 0 % (ref 0.0–0.2)

## 2019-02-04 LAB — URINALYSIS, ROUTINE W REFLEX MICROSCOPIC
Bilirubin Urine: NEGATIVE
Glucose, UA: NEGATIVE mg/dL
Hgb urine dipstick: NEGATIVE
Ketones, ur: NEGATIVE mg/dL
Leukocytes,Ua: NEGATIVE
Nitrite: NEGATIVE
Protein, ur: NEGATIVE mg/dL
Specific Gravity, Urine: 1.017 (ref 1.005–1.030)
pH: 5 (ref 5.0–8.0)

## 2019-02-04 LAB — SURGICAL PCR SCREEN
MRSA, PCR: NEGATIVE
Staphylococcus aureus: NEGATIVE

## 2019-02-04 LAB — TYPE AND SCREEN
ABO/RH(D): O POS
Antibody Screen: NEGATIVE

## 2019-02-04 LAB — PROTIME-INR
INR: 0.9 (ref 0.8–1.2)
Prothrombin Time: 12.2 seconds (ref 11.4–15.2)

## 2019-02-04 LAB — APTT: aPTT: 29 seconds (ref 24–36)

## 2019-02-04 LAB — SEDIMENTATION RATE: Sed Rate: 2 mm/hr (ref 0–20)

## 2019-02-04 NOTE — Patient Instructions (Signed)
Your procedure is scheduled on: thurs. 10/15 Report to Day Surgery. To find out your arrival time please call [phone] between 1PM - 3PM on Wed. 10/14.  Remember: Instructions that are not followed completely may result in serious medical risk,  up to and including death, or upon the discretion of your surgeon and anesthesiologist your  surgery may need to be rescheduled.     _X__ 1. Do not eat food after midnight the night before your procedure.                 No gum chewing or hard candies. You may drink clear liquids up to 2 hours                 before you are scheduled to arrive for your surgery- DO not drink clear                 liquids within 2 hours of the start of your surgery.                 Clear Liquids include:  water, apple juice without pulp, clear carbohydrate                 drink such as Clearfast of Gatorade, Black Coffee or Tea (Do not add                 anything to coffee or tea).  __X__2.  On the morning of surgery brush your teeth with toothpaste and water, you                may rinse your mouth with mouthwash if you wish.  Do not swallow any toothpaste of mouthwash.     _X__ 3.  No Alcohol for 24 hours before or after surgery.   _X__ 4.  Do Not Smoke or use e-cigarettes For 24 Hours Prior to Your Surgery.                 Do not use any chewable tobacco products for at least 6 hours prior to                 surgery.  ____  5.  Bring all medications with you on the day of surgery if instructed.   _x___  6.  Notify your doctor if there is any change in your medical condition      (cold, fever, infections).     Do not wear jewelry, make-up, hairpins, clips or nail polish. Do not wear lotions, powders, or perfumes. You may wear deodorant. Do not shave 48 hours prior to surgery. Men may shave face and neck. Do not bring valuables to the hospital.    [facility] is not responsible for any belongings or valuables.  Contacts,  dentures or bridgework may not be worn into surgery. Leave your suitcase in the car. After surgery it may be brought to your room. For patients admitted to the hospital, discharge time is determined by your treatment team.   Patients discharged the day of surgery will not be allowed to drive home.   Please read over the following fact sheets that you were given:    __x__ Take these medicines the morning of surgery with A SIP OF WATER:    1. Tylenol if needed  2.   3.   4.  5.  6.  ____ Fleet Enema (as directed)   __x__ Use CHG Soap as directed  ____ Use inhalers on the  day of surgery  ____ Stop metformin 2 days prior to surgery    ____ Take 1/2 of usual insulin dose the night before surgery. No insulin the morning          of surgery.   ____ Stop Coumadin/Plavix/aspirin on   __x__ Stop Anti-inflammatories Aspirin-Salicylamide-Caffeine (BC FAST PAIN RELIEF) 650-195-33.3 MG PACK, Aleve or Ibuprofen.  May take tylenol   ____ Stop supplements until after surgery.    ____ Bring C-Pap to the hospital.

## 2019-02-05 LAB — URINE CULTURE
Culture: NO GROWTH
Special Requests: NORMAL

## 2019-02-08 ENCOUNTER — Other Ambulatory Visit
Admission: RE | Admit: 2019-02-08 | Discharge: 2019-02-08 | Disposition: A | Discharge status: Home / Self Care | Payer: BLUE CROSS/BLUE SHIELD | Source: Ambulatory Visit | Attending: Orthopedic Surgery | Admitting: Orthopedic Surgery

## 2019-02-08 DIAGNOSIS — Z01812 Encounter for preprocedural laboratory examination: Secondary | ICD-10-CM | POA: Insufficient documentation

## 2019-02-08 DIAGNOSIS — Z20828 Contact with and (suspected) exposure to other viral communicable diseases: Secondary | ICD-10-CM | POA: Insufficient documentation

## 2019-02-08 LAB — SARS CORONAVIRUS 2 (TAT 6-24 HRS): SARS Coronavirus 2: NEGATIVE

## 2019-02-10 MED ORDER — TRANEXAMIC ACID-NACL 1000-0.7 MG/100ML-% IV SOLN
1000.0000 mg | INTRAVENOUS | Status: DC
  Filled 2019-02-10: qty 100

## 2019-02-11 ENCOUNTER — Other Ambulatory Visit: Payer: Self-pay

## 2019-02-11 ENCOUNTER — Encounter
Admission: RE | Disposition: A | Discharge status: Home Health | Payer: Self-pay | Source: Home / Self Care | Attending: Orthopedic Surgery

## 2019-02-11 ENCOUNTER — Inpatient Hospital Stay: Payer: BLUE CROSS/BLUE SHIELD | Admitting: Anesthesiology

## 2019-02-11 ENCOUNTER — Encounter: Payer: Self-pay | Admitting: *Deleted

## 2019-02-11 ENCOUNTER — Inpatient Hospital Stay: Payer: BLUE CROSS/BLUE SHIELD

## 2019-02-11 ENCOUNTER — Inpatient Hospital Stay
Admission: RE | Admit: 2019-02-11 | Discharge: 2019-02-13 | DRG: 470 | Disposition: A | Discharge status: Home Health | Payer: BLUE CROSS/BLUE SHIELD | Attending: Orthopedic Surgery | Admitting: Orthopedic Surgery

## 2019-02-11 DIAGNOSIS — F411 Generalized anxiety disorder: Secondary | ICD-10-CM | POA: Diagnosis present

## 2019-02-11 DIAGNOSIS — Z86718 Personal history of other venous thrombosis and embolism: Secondary | ICD-10-CM | POA: Diagnosis not present

## 2019-02-11 DIAGNOSIS — G4730 Sleep apnea, unspecified: Secondary | ICD-10-CM | POA: Diagnosis present

## 2019-02-11 DIAGNOSIS — M879 Osteonecrosis, unspecified: Secondary | ICD-10-CM | POA: Diagnosis present

## 2019-02-11 DIAGNOSIS — Z87891 Personal history of nicotine dependence: Secondary | ICD-10-CM

## 2019-02-11 DIAGNOSIS — I10 Essential (primary) hypertension: Secondary | ICD-10-CM | POA: Diagnosis present

## 2019-02-11 DIAGNOSIS — E119 Type 2 diabetes mellitus without complications: Secondary | ICD-10-CM | POA: Diagnosis present

## 2019-02-11 DIAGNOSIS — Z79899 Other long term (current) drug therapy: Secondary | ICD-10-CM | POA: Diagnosis not present

## 2019-02-11 DIAGNOSIS — I7300 Raynaud's syndrome without gangrene: Secondary | ICD-10-CM | POA: Diagnosis present

## 2019-02-11 DIAGNOSIS — G8918 Other acute postprocedural pain: Secondary | ICD-10-CM

## 2019-02-11 DIAGNOSIS — Z96642 Presence of left artificial hip joint: Secondary | ICD-10-CM

## 2019-02-11 DIAGNOSIS — Z419 Encounter for procedure for purposes other than remedying health state, unspecified: Secondary | ICD-10-CM

## 2019-02-11 DIAGNOSIS — M1612 Unilateral primary osteoarthritis, left hip: Secondary | ICD-10-CM | POA: Diagnosis present

## 2019-02-11 HISTORY — PX: TOTAL HIP ARTHROPLASTY: SHX124

## 2019-02-11 LAB — CREATININE, SERUM
Creatinine, Ser: 0.71 mg/dL (ref 0.61–1.24)
GFR calc Af Amer: >60 mL/min (ref >60)
GFR calc non Af Amer: >60 mL/min (ref >60)

## 2019-02-11 LAB — CBC
HCT: 44.9 % (ref 39.0–52.0)
Hemoglobin: 15.6 g/dL (ref 13.0–17.0)
MCH: 34.5 pg — ABNORMAL HIGH (ref 26.0–34.0)
MCHC: 34.7 g/dL (ref 30.0–36.0)
MCV: 99.3 fL (ref 80.0–100.0)
Platelets: 169 10*3/uL (ref 150–400)
RBC: 4.52 MIL/uL (ref 4.22–5.81)
RDW: 13.6 % (ref 11.5–15.5)
WBC: 11.9 10*3/uL — ABNORMAL HIGH (ref 4.0–10.5)
nRBC: 0 % (ref 0.0–0.2)

## 2019-02-11 LAB — GLUCOSE, CAPILLARY
Glucose-Capillary: 174 mg/dL — ABNORMAL HIGH (ref 70–99)
Glucose-Capillary: 142 mg/dL — ABNORMAL HIGH (ref 70–99)

## 2019-02-11 SURGERY — ARTHROPLASTY, HIP, TOTAL, ANTERIOR APPROACH
Anesthesia: Spinal | Site: Hip | Laterality: Left

## 2019-02-11 MED ORDER — FENTANYL CITRATE (PF) 100 MCG/2ML IJ SOLN: 25.0000 ug | INTRAMUSCULAR | Status: DC | PRN

## 2019-02-11 MED ORDER — SODIUM CHLORIDE 0.9 % IV SOLN
INTRAVENOUS | Status: DC | PRN
  Administered 2019-02-11: 60 mL

## 2019-02-11 MED ORDER — BUPIVACAINE LIPOSOME 1.3 % IJ SUSP
INTRAMUSCULAR | Status: AC
  Filled 2019-02-11: qty 20

## 2019-02-11 MED ORDER — EPHEDRINE SULFATE 50 MG/ML IJ SOLN
INTRAMUSCULAR | Status: DC | PRN
  Administered 2019-02-11: 10 mg via INTRAVENOUS

## 2019-02-11 MED ORDER — ACETAMINOPHEN 500 MG PO TABS
1000.0000 mg | ORAL_TABLET | Freq: Four times a day (QID) | ORAL | Status: AC
  Administered 2019-02-11: 1000 mg via ORAL
  Filled 2019-02-11 (×3): qty 2

## 2019-02-11 MED ORDER — OXYCODONE HCL 5 MG PO TABS
10.0000 mg | ORAL_TABLET | ORAL | Status: DC | PRN
  Administered 2019-02-11 (×2): 15 mg via ORAL
  Administered 2019-02-12: 10 mg via ORAL
  Administered 2019-02-12 – 2019-02-13 (×4): 15 mg via ORAL
  Administered 2019-02-13: 10 mg via ORAL
  Filled 2019-02-11 (×6): qty 3

## 2019-02-11 MED ORDER — ONDANSETRON HCL 4 MG/2ML IJ SOLN: 4.0000 mg | Freq: Four times a day (QID) | INTRAMUSCULAR | Status: DC | PRN

## 2019-02-11 MED ORDER — CEFAZOLIN SODIUM-DEXTROSE 2-4 GM/100ML-% IV SOLN
2.0000 g | Freq: Once | INTRAVENOUS | Status: AC
  Administered 2019-02-11: 2 g via INTRAVENOUS

## 2019-02-11 MED ORDER — MENTHOL 3 MG MT LOZG
1.0000 | LOZENGE | OROMUCOSAL | Status: DC | PRN
  Filled 2019-02-11: qty 9

## 2019-02-11 MED ORDER — OXYCODONE HCL 5 MG PO TABS
5.0000 mg | ORAL_TABLET | ORAL | Status: DC | PRN
  Administered 2019-02-11: 10 mg via ORAL
  Filled 2019-02-11 (×2): qty 2

## 2019-02-11 MED ORDER — LACTATED RINGERS IV SOLN
INTRAVENOUS | Status: DC | PRN
  Administered 2019-02-11: 11:00:00 via INTRAVENOUS

## 2019-02-11 MED ORDER — MIDAZOLAM HCL 2 MG/2ML IJ SOLN
INTRAMUSCULAR | Status: AC
  Filled 2019-02-11: qty 2

## 2019-02-11 MED ORDER — EPINEPHRINE PF 1 MG/ML IJ SOLN
INTRAMUSCULAR | Status: AC
  Filled 2019-02-11: qty 1

## 2019-02-11 MED ORDER — PROPOFOL 500 MG/50ML IV EMUL
INTRAVENOUS | Status: DC | PRN
  Administered 2019-02-11: 160 ug/kg/min via INTRAVENOUS

## 2019-02-11 MED ORDER — BUPIVACAINE-EPINEPHRINE 0.25% -1:200000 IJ SOLN
INTRAMUSCULAR | Status: DC | PRN
  Administered 2019-02-11: 30 mL

## 2019-02-11 MED ORDER — CEFAZOLIN SODIUM-DEXTROSE 2-4 GM/100ML-% IV SOLN
2.0000 g | Freq: Four times a day (QID) | INTRAVENOUS | Status: AC
  Administered 2019-02-11 – 2019-02-12 (×3): 2 g via INTRAVENOUS
  Filled 2019-02-11 (×3): qty 100

## 2019-02-11 MED ORDER — BENAZEPRIL HCL 20 MG PO TABS
40.0000 mg | ORAL_TABLET | Freq: Every day | ORAL | Status: DC
  Administered 2019-02-12 – 2019-02-13 (×2): 40 mg via ORAL
  Filled 2019-02-11 (×3): qty 2

## 2019-02-11 MED ORDER — ZOLPIDEM TARTRATE 5 MG PO TABS: 5.0000 mg | ORAL_TABLET | Freq: Every evening | ORAL | Status: DC | PRN

## 2019-02-11 MED ORDER — CEFAZOLIN SODIUM-DEXTROSE 2-4 GM/100ML-% IV SOLN
INTRAVENOUS | Status: AC
  Filled 2019-02-11: qty 100

## 2019-02-11 MED ORDER — PROPOFOL 10 MG/ML IV BOLUS
INTRAVENOUS | Status: DC | PRN
  Administered 2019-02-11: 70 mg via INTRAVENOUS

## 2019-02-11 MED ORDER — PROPOFOL 500 MG/50ML IV EMUL
INTRAVENOUS | Status: AC
  Filled 2019-02-11: qty 50

## 2019-02-11 MED ORDER — METHOCARBAMOL 1000 MG/10ML IJ SOLN
500.0000 mg | Freq: Four times a day (QID) | INTRAVENOUS | Status: DC | PRN
  Filled 2019-02-11: qty 5

## 2019-02-11 MED ORDER — METOCLOPRAMIDE HCL 10 MG PO TABS: 5.0000 mg | ORAL_TABLET | Freq: Three times a day (TID) | ORAL | Status: DC | PRN

## 2019-02-11 MED ORDER — BUPIVACAINE HCL (PF) 0.5 % IJ SOLN
INTRAMUSCULAR | Status: DC | PRN
  Administered 2019-02-11: 3 mL

## 2019-02-11 MED ORDER — ENOXAPARIN SODIUM 40 MG/0.4ML ~~LOC~~ SOLN
40.0000 mg | SUBCUTANEOUS | Status: DC
  Administered 2019-02-12 – 2019-02-13 (×2): 40 mg via SUBCUTANEOUS
  Filled 2019-02-11 (×2): qty 0.4

## 2019-02-11 MED ORDER — PROMETHAZINE HCL 25 MG/ML IJ SOLN: 6.2500 mg | INTRAMUSCULAR | Status: DC | PRN

## 2019-02-11 MED ORDER — HYDROMORPHONE HCL 1 MG/ML IJ SOLN
0.5000 mg | INTRAMUSCULAR | Status: DC | PRN
  Administered 2019-02-11: 1 mg via INTRAVENOUS
  Filled 2019-02-11: qty 1

## 2019-02-11 MED ORDER — DIPHENHYDRAMINE HCL 12.5 MG/5ML PO ELIX: 12.5000 mg | ORAL_SOLUTION | ORAL | Status: DC | PRN

## 2019-02-11 MED ORDER — FAMOTIDINE 20 MG PO TABS
20.0000 mg | ORAL_TABLET | Freq: Once | ORAL | Status: AC
  Administered 2019-02-11: 08:00:00 20 mg via ORAL

## 2019-02-11 MED ORDER — DOCUSATE SODIUM 100 MG PO CAPS
100.0000 mg | ORAL_CAPSULE | Freq: Two times a day (BID) | ORAL | Status: DC
  Administered 2019-02-11 – 2019-02-13 (×4): 100 mg via ORAL
  Filled 2019-02-11 (×4): qty 1

## 2019-02-11 MED ORDER — MAGNESIUM HYDROXIDE 400 MG/5ML PO SUSP
30.0000 mL | Freq: Every day | ORAL | Status: DC | PRN
  Filled 2019-02-11: qty 30

## 2019-02-11 MED ORDER — PANTOPRAZOLE SODIUM 40 MG PO TBEC
40.0000 mg | DELAYED_RELEASE_TABLET | Freq: Every day | ORAL | Status: DC
  Administered 2019-02-12 – 2019-02-13 (×2): 40 mg via ORAL
  Filled 2019-02-11 (×3): qty 1

## 2019-02-11 MED ORDER — BUPIVACAINE HCL (PF) 0.25 % IJ SOLN
INTRAMUSCULAR | Status: AC
  Filled 2019-02-11: qty 30

## 2019-02-11 MED ORDER — FAMOTIDINE 20 MG PO TABS
ORAL_TABLET | ORAL | Status: AC
  Administered 2019-02-11: 20 mg via ORAL
  Filled 2019-02-11: qty 1

## 2019-02-11 MED ORDER — CLONAZEPAM 0.5 MG PO TABS
0.5000 mg | ORAL_TABLET | Freq: Once | ORAL | Status: AC
  Administered 2019-02-11: 0.5 mg via ORAL

## 2019-02-11 MED ORDER — SODIUM CHLORIDE 0.9 % IV SOLN
INTRAVENOUS | Status: DC
  Administered 2019-02-11: 08:00:00 via INTRAVENOUS

## 2019-02-11 MED ORDER — ONDANSETRON HCL 4 MG PO TABS
4.0000 mg | ORAL_TABLET | Freq: Four times a day (QID) | ORAL | Status: DC | PRN
  Administered 2019-02-11 – 2019-02-12 (×2): 4 mg via ORAL
  Filled 2019-02-11 (×2): qty 1

## 2019-02-11 MED ORDER — NEOMYCIN-POLYMYXIN B GU 40-200000 IR SOLN
Status: AC
  Filled 2019-02-11: qty 1

## 2019-02-11 MED ORDER — ALUM & MAG HYDROXIDE-SIMETH 200-200-20 MG/5ML PO SUSP
30.0000 mL | ORAL | Status: DC | PRN
  Administered 2019-02-12: 30 mL via ORAL
  Filled 2019-02-11: qty 30

## 2019-02-11 MED ORDER — MEPERIDINE HCL 50 MG/ML IJ SOLN
6.2500 mg | Freq: Once | INTRAMUSCULAR | Status: AC
  Administered 2019-02-11: 12:00:00 6.25 mg via INTRAVENOUS

## 2019-02-11 MED ORDER — MEPERIDINE HCL 50 MG/ML IJ SOLN
INTRAMUSCULAR | Status: AC
  Administered 2019-02-11: 6.25 mg via INTRAVENOUS
  Filled 2019-02-11: qty 1

## 2019-02-11 MED ORDER — TRANEXAMIC ACID-NACL 1000-0.7 MG/100ML-% IV SOLN
INTRAVENOUS | Status: DC | PRN
  Administered 2019-02-11: 1000 mg via INTRAVENOUS

## 2019-02-11 MED ORDER — SODIUM CHLORIDE 0.9 % IV SOLN
INTRAVENOUS | Status: DC | PRN
  Administered 2019-02-11: 25 ug/min via INTRAVENOUS

## 2019-02-11 MED ORDER — METOCLOPRAMIDE HCL 5 MG/ML IJ SOLN: 5.0000 mg | Freq: Three times a day (TID) | INTRAMUSCULAR | Status: DC | PRN

## 2019-02-11 MED ORDER — ACETAMINOPHEN 325 MG PO TABS
325.0000 mg | ORAL_TABLET | Freq: Four times a day (QID) | ORAL | Status: DC | PRN
  Administered 2019-02-12 – 2019-02-13 (×2): 650 mg via ORAL
  Filled 2019-02-11 (×3): qty 2

## 2019-02-11 MED ORDER — ESMOLOL HCL 100 MG/10ML IV SOLN
INTRAVENOUS | Status: DC | PRN
  Administered 2019-02-11 (×2): 10 mg via INTRAVENOUS

## 2019-02-11 MED ORDER — PHENOL 1.4 % MT LIQD
1.0000 | OROMUCOSAL | Status: DC | PRN
  Filled 2019-02-11: qty 177

## 2019-02-11 MED ORDER — MAGNESIUM CITRATE PO SOLN
1.0000 | Freq: Once | ORAL | Status: DC | PRN
  Filled 2019-02-11: qty 296

## 2019-02-11 MED ORDER — OXYCODONE HCL 5 MG PO TABS
ORAL_TABLET | ORAL | Status: AC
  Filled 2019-02-11: qty 2

## 2019-02-11 MED ORDER — SODIUM CHLORIDE FLUSH 0.9 % IV SOLN
INTRAVENOUS | Status: AC
  Filled 2019-02-11: qty 40

## 2019-02-11 MED ORDER — BISACODYL 10 MG RE SUPP: 10.0000 mg | Freq: Every day | RECTAL | Status: DC | PRN

## 2019-02-11 MED ORDER — TRAMADOL HCL 50 MG PO TABS
50.0000 mg | ORAL_TABLET | Freq: Four times a day (QID) | ORAL | Status: DC
  Administered 2019-02-11 – 2019-02-13 (×5): 50 mg via ORAL
  Filled 2019-02-11 (×6): qty 1

## 2019-02-11 MED ORDER — SODIUM CHLORIDE 0.9 % IV SOLN
INTRAVENOUS | Status: DC
  Administered 2019-02-11 – 2019-02-12 (×2): via INTRAVENOUS

## 2019-02-11 MED ORDER — CLONAZEPAM 0.5 MG PO TABS
0.5000 mg | ORAL_TABLET | Freq: Three times a day (TID) | ORAL | Status: DC
  Administered 2019-02-11 – 2019-02-13 (×6): 0.5 mg via ORAL
  Filled 2019-02-11 (×6): qty 1

## 2019-02-11 MED ORDER — NEOMYCIN-POLYMYXIN B GU 40-200000 IR SOLN
Status: DC | PRN
  Administered 2019-02-11: 4 mL

## 2019-02-11 MED ORDER — METHOCARBAMOL 500 MG PO TABS
500.0000 mg | ORAL_TABLET | Freq: Four times a day (QID) | ORAL | Status: DC | PRN
  Administered 2019-02-11: 500 mg via ORAL
  Filled 2019-02-11: qty 1

## 2019-02-11 MED ORDER — MIDAZOLAM HCL 5 MG/5ML IJ SOLN
INTRAMUSCULAR | Status: DC | PRN
  Administered 2019-02-11 (×2): 2 mg via INTRAVENOUS

## 2019-02-11 SURGICAL SUPPLY — 60 items
BLADE SAGITTAL AGGR TOOTH XLG (BLADE) ×2 IMPLANT
BLADE SAW 90X13X1.19 OSCILLAT (BLADE) ×2 IMPLANT
BNDG COHESIVE 6X5 TAN STRL LF (GAUZE/BANDAGES/DRESSINGS) ×6 IMPLANT
CANISTER SUCT 1200ML W/VALVE (MISCELLANEOUS) ×2 IMPLANT
CANISTER WOUND CARE 500ML ATS (WOUND CARE) ×2 IMPLANT
CHLORAPREP W/TINT 26 (MISCELLANEOUS) ×2 IMPLANT
COVER BACK TABLE REUSABLE LG (DRAPES) ×2 IMPLANT
COVER WAND RF STERILE (DRAPES) ×2 IMPLANT
DRAPE 3/4 80X56 (DRAPES) ×6 IMPLANT
DRAPE C-ARM XRAY 36X54 (DRAPES) ×2 IMPLANT
DRAPE INCISE IOBAN 66X60 STRL (DRAPES) IMPLANT
DRAPE POUCH INSTRU U-SHP 10X18 (DRAPES) ×2 IMPLANT
DRESSING SURGICEL FIBRLLR 1X2 (HEMOSTASIS) ×2 IMPLANT
DRSG OPSITE POSTOP 4X8 (GAUZE/BANDAGES/DRESSINGS) IMPLANT
DRSG SURGICEL FIBRILLAR 1X2 (HEMOSTASIS) ×4
ELECT BLADE 6.5 EXT (BLADE) ×2 IMPLANT
ELECT REM PT RETURN 9FT ADLT (ELECTROSURGICAL) ×2
ELECTRODE REM PT RTRN 9FT ADLT (ELECTROSURGICAL) ×1 IMPLANT
GLOVE BIOGEL PI IND STRL 9 (GLOVE) ×1 IMPLANT
GLOVE BIOGEL PI INDICATOR 9 (GLOVE) ×1
GLOVE SURG SYN 9.0  PF PI (GLOVE) ×2
GLOVE SURG SYN 9.0 PF PI (GLOVE) ×2 IMPLANT
GOWN SRG 2XL LVL 4 RGLN SLV (GOWNS) ×1 IMPLANT
GOWN STRL NON-REIN 2XL LVL4 (GOWNS) ×1
GOWN STRL REUS W/ TWL LRG LVL3 (GOWN DISPOSABLE) ×1 IMPLANT
GOWN STRL REUS W/TWL LRG LVL3 (GOWN DISPOSABLE) ×1
HEAD FEMORAL 28MM SZ S (Head) ×2 IMPLANT
HEMOVAC 400CC 10FR (MISCELLANEOUS) IMPLANT
HIP DBL LINER 54X28 (Liner) ×2 IMPLANT
HOLDER FOLEY CATH W/STRAP (MISCELLANEOUS) ×2 IMPLANT
HOOD PEEL AWAY FLYTE STAYCOOL (MISCELLANEOUS) ×2 IMPLANT
KIT PREVENA INCISION MGT 13 (CANNISTER) ×2 IMPLANT
MAT ABSORB  FLUID 56X50 GRAY (MISCELLANEOUS) ×1
MAT ABSORB FLUID 56X50 GRAY (MISCELLANEOUS) ×1 IMPLANT
NDL SAFETY ECLIPSE 18X1.5 (NEEDLE) ×1 IMPLANT
NEEDLE HYPO 18GX1.5 SHARP (NEEDLE) ×1
NEEDLE SPNL 20GX3.5 QUINCKE YW (NEEDLE) ×4 IMPLANT
NS IRRIG 1000ML POUR BTL (IV SOLUTION) ×2 IMPLANT
PACK HIP COMPR (MISCELLANEOUS) ×2 IMPLANT
SCALPEL PROTECTED #10 DISP (BLADE) ×4 IMPLANT
SHELL ACETABULAR SZ 54 DM (Shell) ×2 IMPLANT
SOL PREP PVP 2OZ (MISCELLANEOUS)
SOLUTION PREP PVP 2OZ (MISCELLANEOUS) IMPLANT
SPONGE DRAIN TRACH 4X4 STRL 2S (GAUZE/BANDAGES/DRESSINGS) IMPLANT
STAPLER SKIN PROX 35W (STAPLE) ×2 IMPLANT
STEM FEMORAL SZ5 STD COLLARED (Stem) ×2 IMPLANT
STRAP SAFETY 5IN WIDE (MISCELLANEOUS) ×2 IMPLANT
SUT DVC 2 QUILL PDO  T11 36X36 (SUTURE) ×1
SUT DVC 2 QUILL PDO T11 36X36 (SUTURE) ×1 IMPLANT
SUT SILK 0 (SUTURE) ×1
SUT SILK 0 30XBRD TIE 6 (SUTURE) ×1 IMPLANT
SUT V-LOC 90 ABS DVC 3-0 CL (SUTURE) ×2 IMPLANT
SUT VIC AB 1 CT1 36 (SUTURE) ×2 IMPLANT
SYR 20ML LL LF (SYRINGE) ×2 IMPLANT
SYR 30ML LL (SYRINGE) ×2 IMPLANT
SYR 50ML LL SCALE MARK (SYRINGE) ×4 IMPLANT
SYR BULB IRRIG 60ML STRL (SYRINGE) ×2 IMPLANT
TAPE MICROFOAM 4IN (TAPE) ×2 IMPLANT
TOWEL OR 17X26 4PK STRL BLUE (TOWEL DISPOSABLE) ×2 IMPLANT
TRAY FOLEY MTR SLVR 16FR STAT (SET/KITS/TRAYS/PACK) ×2 IMPLANT

## 2019-02-11 NOTE — TOC Benefit Eligibility Note (Signed)
Transition of Care [facility]) Benefit Eligibility Note    Patient Details  Name: Harold Tate MRN: [id number] Date of Birth: [birth year]   Medication/Dose: Enoxaparin 41m once daily for 14 days  Covered?: Yes  Tier: 2 Drug  Prescription Coverage [facility] Person/Company/Phone Number:: JSonia Sidewith [facility] [phone] Co-Pay: $0.00 estimated copay  Prior Approval: No  Deductible: Met(Out of pocket max met as well.)   HDannette BarbaraPhone Number: [phone] [phone]/15/2020, 3:04 PM

## 2019-02-11 NOTE — Progress Notes (Signed)
MD alerted to BP elevation, awaiting orders

## 2019-02-11 NOTE — Op Note (Signed)
02/11/2019  11:11 AM  PATIENT:  Harold Tate  53 y.o. male  PRE-OPERATIVE DIAGNOSIS: Avascular necrosis of LEFT HIP  POST-OPERATIVE DIAGNOSIS:  avascular necrosis left hip  PROCEDURE:  Procedure(s): TOTAL HIP ARTHROPLASTY ANTERIOR APPROACH (Left)  SURGEON: Laurene Footman, MD  ASSISTANTS: None  ANESTHESIA:   spinal  EBL:  Total I/O In: 1300 [I.V.:1300] Out: 400 [Urine:200; Blood:200]  BLOOD ADMINISTERED:none  DRAINS: none   LOCAL MEDICATIONS USED:  MARCAINE    and OTHER Exparel  SPECIMEN:  Source of Specimen:  Left femoral head  DISPOSITION OF SPECIMEN:  PATHOLOGY  COUNTS:  YES  TOURNIQUET:  * No tourniquets in log *  IMPLANTS: Medacta AMIS 5 standard stem, 54 mm Mpact DM cup and liner with ceramic S 28 mm head  DICTATION: .Dragon Dictation   The patient was brought to the operating room and after spinal anesthesia was obtained patient was placed on the operative table with the ipsilateral foot into the Medacta attachment, contralateral leg on a well-padded table. C-arm was brought in and preop template x-ray taken. After prepping and draping in usual sterile fashion appropriate patient identification and timeout procedures were completed. Anterior approach to the hip was obtained and centered over the greater trochanter and TFL muscle. The subcutaneous tissue was incised hemostasis being achieved by electrocautery. TFL fascia was incised and the muscle retracted laterally deep retractor placed. The lateral femoral circumflex vessels were identified and ligated. The anterior capsule was exposed and a capsulotomy performed. The neck was identified and a femoral neck cut carried out with a saw. The head was removed without difficulty and showed articular cartilage that was peeling off the femoral head and acetabulum also show degenerative change. Reaming was carried out to 52 mm and a 54 mm cup trial gave appropriate tightness to the acetabular component a 54 cup was impacted  into position. The leg was then externally rotated and ischiofemoral and pubofemoral releases carried out. The femur was sequentially broached to a size 5, size 5 standard with S head trials were placed and the final components chosen. The 5 standard stem was inserted along with a ceramic S 28 mm head and 54 mm liner. The hip was reduced and was stable the wound was thoroughly irrigated with fibrillar placed along the posterior capsule and medial neck.  Exparel was used throughout the case injected in the periarticular tissues the deep fascia ws closed using a heavy Quill after infiltration of 30 cc of quarter percent Sensorcaine with epinephrine.3-0 V-loc to close the skin with skin staples.  Incisional wound VAC applied and patient was sent to recovery in stable condition.   PLAN OF CARE: Admit to inpatient

## 2019-02-11 NOTE — TOC Progression Note (Signed)
Transition of Care [facility]) - Progression Note    Patient Details  Name: Harold Tate MRN: [id number] Date of Birth: 23-Oct-1965  Transition of Care [facility]) CM/SW [place], RN Phone Number: 02/11/2019, 2:39 PM  Clinical Narrative:     Requested the price of Lovenox        Expected Discharge Plan and Services                                                 Social Determinants of Health (SDOH) Interventions    Readmission Risk Interventions No flowsheet data found.

## 2019-02-11 NOTE — H&P (Signed)
Reviewed paper H+P, will be scanned into chart. No changes noted.  

## 2019-02-11 NOTE — Evaluation (Signed)
Physical Therapy Evaluation Patient Details Name: Harold Tate MRN: [id number] DOB: 02-03-1966 Today's Date: 02/11/2019   History of Present Illness  Pt is a 53 yo male diagnosed with avascular necrosis of the left hip and is s/p elective L THA with anterior approach.  PMH includes: HTN, COPD, SOB, sleep apnea, smoker, anxiety, Raynaud's syndrome, L ankle ORIF, and R THA.    Clinical Impression  Pt presented with deficits in strength, transfers, mobility, gait, balance, and activity tolerance.  Pt required min A with bed mobility tasks for LLE control and preferred to use a SPC to hook his L foot to assist.  Pt tolerated the below gentle therex well but further mobility deferred secondary to elevated BP.  Overall pt performed well considering POD#0 status and should make good functional progress while in acute care as he did recently after R THA.  Pt will benefit from HHPT services upon discharge to safely address above deficits for decreased caregiver assistance and eventual return to PLOF.      Follow Up Recommendations Home health PT;Supervision for mobility/OOB    Equipment Recommendations  None recommended by PT    Recommendations for Other Services       Precautions / Restrictions Precautions Precautions: Anterior Hip Precaution Booklet Issued: Yes (comment) Restrictions Weight Bearing Restrictions: Yes LLE Weight Bearing: Weight bearing as tolerated      Mobility  Bed Mobility Overal bed mobility: Needs Assistance Bed Mobility: Supine to Sit;Sit to Supine     Supine to sit: Min assist Sit to supine: Min assist   General bed mobility comments: Min A for LLE in/out of bed along with extra time and effort  Transfers                 General transfer comment: NT secondary to elevated BP  Ambulation/Gait             General Gait Details: NT secondary to elevated BP  Stairs            Wheelchair Mobility    Modified Rankin (Stroke Patients  Only)       Balance Overall balance assessment: Needs assistance Sitting-balance support: Bilateral upper extremity supported;Feet supported Sitting balance-Leahy Scale: Good                                       Pertinent Vitals/Pain Pain Assessment: 0-10 Pain Score: 8  Pain Location: L hip Pain Descriptors / Indicators: Aching;Sore Pain Intervention(s): Premedicated before session;Monitored during session    [place] expects to be discharged to:: Private residence Living Arrangements: Children;Spouse/significant other(Lives with girlfriend and son) Available Help at Discharge: Family;Available 24 hours/day Type of Home: House Home Access: Stairs to enter Entrance Stairs-Rails: Left Entrance Stairs-Number of Steps: 3 Home Layout: Two level;Able to live on main level with bedroom/bathroom Home Equipment: Gilford Rile - 2 wheels;Bedside commode      Prior Function Level of Independence: Independent with assistive device(s)         Comments: Mod Ind amb with a SPC secondary to L hip pain with no fall history, Ind with ADLs     Hand Dominance   Dominant Hand: Right    Extremity/Trunk Assessment   Upper Extremity Assessment Upper Extremity Assessment: Overall WFL for tasks assessed    Lower Extremity Assessment Lower Extremity Assessment: Generalized weakness;LLE deficits/detail LLE Deficits / Details: L hip flex strength <3/5 LLE: Unable  to fully assess due to pain LLE Sensation: WNL       Communication   Communication: No difficulties  Cognition Arousal/Alertness: Awake/alert Behavior During Therapy: WFL for tasks assessed/performed Overall Cognitive Status: Within Functional Limits for tasks assessed                                        General Comments      Exercises Total Joint Exercises Ankle Circles/Pumps: AROM;Strengthening;Both;10 reps Quad Sets: Strengthening;Both;10 reps Heel Slides:  AROM;AAROM;Both;5 reps Hip ABduction/ADduction: AROM;AAROM;Both;5 reps Straight Leg Raises: AROM;AAROM;Both;5 reps Long Arc Quad: AROM;Both;10 reps;Strengthening Knee Flexion: AROM;Strengthening;Both;10 reps Other Exercises Other Exercises: HEP per handout provided   Assessment/Plan    PT Assessment Patient needs continued PT services  PT Problem List Decreased strength;Decreased activity tolerance;Decreased balance;Decreased mobility       PT Treatment Interventions DME instruction;Gait training;Stair training;Functional mobility training;Therapeutic activities;Therapeutic exercise;Balance training;Patient/family education    PT Goals (Current goals can be found in the Care Plan section)  Acute Rehab PT Goals Patient Stated Goal: To get stronger and walk better PT Goal Formulation: With patient Time For Goal Achievement: 02/24/19 Potential to Achieve Goals: Good    Frequency BID   Barriers to discharge        Co-evaluation               AM-PAC PT "6 Clicks" Mobility  Outcome Measure Help needed turning from your back to your side while in a flat bed without using bedrails?: A Little Help needed moving from lying on your back to sitting on the side of a flat bed without using bedrails?: A Little Help needed moving to and from a bed to a chair (including a wheelchair)?: A Lot Help needed standing up from a chair using your arms (e.g., wheelchair or bedside chair)?: A Lot Help needed to walk in hospital room?: A Lot Help needed climbing 3-5 steps with a railing? : A Lot 6 Click Score: 14    End of Session   Activity Tolerance: Patient tolerated treatment well;Treatment limited secondary to medical complications (Comment)(Transfers/ambulation deferred secondary to elevated BP) Patient left: in bed;with SCD's reapplied;with call bell/phone within reach;with family/visitor present;with bed alarm set Nurse Communication: Mobility status;Other (comment)(Pt requesting ice  for L hip and ace wrap or ice for L knee) PT Visit Diagnosis: Other abnormalities of gait and mobility (R26.89);Muscle weakness (generalized) (M62.81)    Time: [id number] PT Time Calculation (min) (ACUTE ONLY): 34 min   Charges:   PT Evaluation $PT Eval Moderate Complexity: 1 Mod PT Treatments $Therapeutic Exercise: 8-22 mins        D. Scott Itzae Mccurdy PT, DPT 02/11/19, 5:20 PM

## 2019-02-11 NOTE — Progress Notes (Signed)
Pt shivering. Dr. Rosey Bath notified. Acknowledged. Orders received.

## 2019-02-11 NOTE — Anesthesia Preprocedure Evaluation (Signed)
Anesthesia Evaluation  Patient identified by MRN, date of birth, ID band Patient awake    Reviewed: Allergy & Precautions, H&P , NPO status , Patient's Chart, lab work & pertinent test results  History of Anesthesia Complications Negative for: history of anesthetic complications  Airway Mallampati: III  TM Distance: >3 FB     Dental  (+) Missing, Caps   Pulmonary shortness of breath, sleep apnea , COPD, neg recent URI, Current Smoker,           Cardiovascular hypertension, (-) angina(-) Past MI and (-) Cardiac Stents (-) dysrhythmias      Neuro/Psych PSYCHIATRIC DISORDERS Anxiety negative neurological ROS     GI/Hepatic negative GI ROS, (+)     substance abuse (reports an average 6 drinks/day)  alcohol use,   Endo/Other  diabetes  Renal/GU      Musculoskeletal  (+) Arthritis ,   Abdominal   Peds  Hematology negative hematology ROS (+)   Anesthesia Other Findings Obese  Past Medical History: No date: Anxiety No date: Arthritis No date: Diabetes mellitus without complication (HCC) No date: Dyspnea No date: Hip pain No date: Hypertension No date: Raynaud's syndrome No date: Sleep apnea  Past Surgical History: No date: I&D EXTREMITY July 2014: OPEN REDUCTION INTERNAL FIXATION (ORIF) TIBIA/FIBULA  FRACTURE; Left     Reproductive/Obstetrics negative OB ROS                             Anesthesia Physical  Anesthesia Plan  ASA: III  Anesthesia Plan: Spinal   Post-op Pain Management:    Induction:   PONV Risk Score and Plan: 1 and Propofol infusion and TIVA  Airway Management Planned: Natural Airway and Simple Face Mask  Additional Equipment:   Intra-op Plan:   Post-operative Plan:   Informed Consent: I have reviewed the patients History and Physical, chart, labs and discussed the procedure including the risks, benefits and alternatives for the proposed anesthesia  with the patient or authorized representative who has indicated his/her understanding and acceptance.     Dental Advisory Given  Plan Discussed with: Anesthesiologist and CRNA  Anesthesia Plan Comments:         Anesthesia Quick Evaluation

## 2019-02-11 NOTE — Anesthesia Procedure Notes (Signed)
Date/Time: 02/11/2019 10:01 AM Performed by: Nelda Marseille, CRNA Pre-anesthesia Checklist: Patient identified, Emergency Drugs available, Suction available, Patient being monitored and Timeout performed Oxygen Delivery Method: Simple face mask

## 2019-02-11 NOTE — Transfer of Care (Signed)
Immediate Anesthesia Transfer of Care Note  Patient: Harold Tate  Procedure(s) Performed: TOTAL HIP ARTHROPLASTY ANTERIOR APPROACH (Left Hip)  Patient Location: PACU  Anesthesia Type:Spinal  Level of Consciousness: awake, alert  and oriented  Airway & Oxygen Therapy: Patient Spontanous Breathing and Patient connected to face mask oxygen  Post-op Assessment: Report given to RN and Post -op Vital signs reviewed and stable  Post vital signs: Reviewed and stable  Last Vitals:  Vitals Value Taken Time  BP    Temp    Pulse 115 02/11/19 1110  Resp    SpO2 97 % 02/11/19 1110  Vitals shown include unvalidated device data.  Last Pain:  Vitals:   02/11/19 0755  TempSrc: Tympanic  PainSc: 2       Patients Stated Pain Goal: 0 ([id number])  Complications: No apparent anesthesia complications

## 2019-02-11 NOTE — Anesthesia Post-op Follow-up Note (Signed)
Anesthesia QCDR form completed.        

## 2019-02-11 NOTE — Anesthesia Procedure Notes (Addendum)
Spinal  Patient location during procedure: OR Start time: 02/11/2019 9:30 AM End time: 02/11/2019 9:36 AM Staffing Resident/CRNA: Nelda Marseille, CRNA Performed: resident/CRNA  Preanesthetic Checklist Completed: patient identified, site marked, surgical consent, pre-op evaluation, timeout performed, IV checked, risks and benefits discussed and monitors and equipment checked Spinal Block Patient position: sitting Prep: Betadine Patient monitoring: heart rate, continuous pulse ox, blood pressure and cardiac monitor Approach: midline Location: L4-5 Injection technique: single-shot Needle Needle type: Whitacre and Introducer  Needle gauge: 25 G Needle length: 9 cm Additional Notes Negative paresthesia. Negative blood return. Positive free-flowing CSF. Expiration date of kit checked and confirmed. Patient tolerated procedure well, without complications.

## 2019-02-12 ENCOUNTER — Encounter: Payer: Self-pay | Admitting: Orthopedic Surgery

## 2019-02-12 LAB — BASIC METABOLIC PANEL
Anion gap: 8 (ref 5–15)
BUN: 7 mg/dL (ref 6–20)
CO2: 28 mmol/L (ref 22–32)
Calcium: 8.4 mg/dL — ABNORMAL LOW (ref 8.9–10.3)
Chloride: 97 mmol/L — ABNORMAL LOW (ref 98–111)
Creatinine, Ser: 0.74 mg/dL (ref 0.61–1.24)
GFR calc Af Amer: >60 mL/min (ref >60)
GFR calc non Af Amer: >60 mL/min (ref >60)
Glucose, Bld: 136 mg/dL — ABNORMAL HIGH (ref 70–99)
Potassium: 4.1 mmol/L (ref 3.5–5.1)
Sodium: 133 mmol/L — ABNORMAL LOW (ref 135–145)

## 2019-02-12 LAB — CBC
HCT: 42.4 % (ref 39.0–52.0)
Hemoglobin: 14.8 g/dL (ref 13.0–17.0)
MCH: 34.2 pg — ABNORMAL HIGH (ref 26.0–34.0)
MCHC: 34.9 g/dL (ref 30.0–36.0)
MCV: 97.9 fL (ref 80.0–100.0)
Platelets: 139 10*3/uL — ABNORMAL LOW (ref 150–400)
RBC: 4.33 MIL/uL (ref 4.22–5.81)
RDW: 13 % (ref 11.5–15.5)
WBC: 5.1 10*3/uL (ref 4.0–10.5)
nRBC: 0 % (ref 0.0–0.2)

## 2019-02-12 LAB — SURGICAL PATHOLOGY

## 2019-02-12 MED ORDER — NICOTINE 21 MG/24HR TD PT24
21.0000 mg | MEDICATED_PATCH | Freq: Every day | TRANSDERMAL | Status: DC
  Administered 2019-02-12 – 2019-02-13 (×2): 21 mg via TRANSDERMAL
  Filled 2019-02-12 (×2): qty 1

## 2019-02-12 NOTE — Progress Notes (Signed)
Physical Therapy Treatment Patient Details Name: Harold Tate MRN: [id number] DOB: 1966/01/06 Today's Date: 02/12/2019    History of Present Illness Pt is a 53 yo male diagnosed with avascular necrosis of the left hip and is s/p elective L THA with anterior approach.  PMH includes: HTN, COPD, SOB, sleep apnea, smoker, anxiety, Raynaud's syndrome, L ankle ORIF, and R THA.    PT Comments    Pt presented with deficits in strength, transfers, mobility, gait, balance, and activity tolerance. Pt reported soreness in L pelvis to distal femur, and burning sensation initially with standing, and some steps during ambulation. Pt required min A to clear LLE over EOB, pt used the handle of his SPC to hook the foot and lift it over. Pt required min A and min cuing to complete slow, antalgic sit<>stand transfers, but with good eccentric and concentric control. Pt required mod cuing and min A to ambulated and advance RW with a slow, antalgic, step-to pattern and walked to the door of his room. Pt will benefit from HHPT services upon discharge to safely address above deficits for decreased caregiver assistance and eventual return to PLOF.    Follow Up Recommendations  Home health PT;Supervision for mobility/OOB     Equipment Recommendations  None recommended by PT    Recommendations for Other Services       Precautions / Restrictions Precautions Precautions: Anterior Hip Precaution Booklet Issued: Yes (comment) Restrictions Weight Bearing Restrictions: Yes LLE Weight Bearing: Weight bearing as tolerated    Mobility  Bed Mobility Overal bed mobility: Needs Assistance Bed Mobility: Supine to Sit;Sit to Supine     Supine to sit: Supervision Sit to supine: Supervision   General bed mobility comments: Pt self-assisted LLE in/out of bed using his SPC to hook the foot.  Transfers Overall transfer level: Needs assistance Equipment used: Rolling walker (2 wheeled) Transfers: Sit to/from  Stand Sit to Stand: Min assist         General transfer comment: Min A to complete sit<>stand, min cuing to find balance.  Ambulation/Gait Ambulation/Gait assistance: Min guard Gait Distance (Feet): 20 Feet Assistive device: Rolling walker (2 wheeled) Gait Pattern/deviations: Step-through pattern;Decreased step length - right;Decreased step length - left;Antalgic;Shuffle Gait velocity: Decreased   General Gait Details: Pt's sock on L foot was turned inside out to allow the surgical side to slide along. Min A for safety, step-to giat with painful and effortful steps.   Stairs             Wheelchair Mobility    Modified Rankin (Stroke Patients Only)       Balance Overall balance assessment: Needs assistance Sitting-balance support: Single extremity supported;Feet unsupported Sitting balance-Leahy Scale: Good     Standing balance support: Bilateral upper extremity supported Standing balance-Leahy Scale: Poor Standing balance comment: Heavy lean into RW, with min A to maintain balance.                            Cognition Arousal/Alertness: Awake/alert Behavior During Therapy: WFL for tasks assessed/performed Overall Cognitive Status: Within Functional Limits for tasks assessed                                        Exercises Total Joint Exercises Ankle Circles/Pumps: AROM;Strengthening;Both;10 reps Straight Leg Raises: AROM;Both;5 reps;Strengthening Knee Flexion: AROM;Strengthening;Both;10 reps Marching in Standing: Strengthening;Both;5 reps;10 reps Other  Exercises Other Exercises: Pt completed static and dynamic standing in RW for ~5 mins.    General Comments        Pertinent Vitals/Pain Pain Assessment: 0-10 Pain Score: 6  Pain Location: L hip Pain Descriptors / Indicators: Sore;Burning Pain Intervention(s): Limited activity within patient's tolerance;Monitored during session    Home Living                       Prior Function            PT Goals (current goals can now be found in the care plan section) Progress towards PT goals: Progressing toward goals    Frequency    BID      PT Plan Current plan remains appropriate    Co-evaluation              AM-PAC PT "6 Clicks" Mobility   Outcome Measure  Help needed turning from your back to your side while in a flat bed without using bedrails?: None Help needed moving from lying on your back to sitting on the side of a flat bed without using bedrails?: None Help needed moving to and from a bed to a chair (including a wheelchair)?: A Little Help needed standing up from a chair using your arms (e.g., wheelchair or bedside chair)?: A Little Help needed to walk in hospital room?: A Little Help needed climbing 3-5 steps with a railing? : A Lot 6 Click Score: 19    End of Session Equipment Utilized During Treatment: Gait belt Activity Tolerance: Patient limited by pain;Patient limited by fatigue Patient left: in chair;with call bell/phone within reach;with chair alarm set;with SCD's reapplied Nurse Communication: Mobility status PT Visit Diagnosis: Other abnormalities of gait and mobility (R26.89);Muscle weakness (generalized) (M62.81);Pain Pain - Right/Left: Left Pain - part of body: Hip;Knee(S/P L THA)     Time: [id number] PT Time Calculation (min) (ACUTE ONLY): 52 min  Charges:  $Gait Training: 8-22 mins $Therapeutic Exercise: 8-22 mins $Therapeutic Activity: 8-22 mins                     Shonette Rhames "Gus" Chaitanya Amedee, SPT  02/12/19, 1:36 PM

## 2019-02-12 NOTE — Evaluation (Signed)
Occupational Therapy Evaluation Patient Details Name: Harold Tate MRN: [id number] DOB: [birth year] Today's Date: 02/12/2019    History of Present Illness Pt is a 53 yo male diagnosed with avascular necrosis of the left hip and is s/p elective L THA with anterior approach.  PMH includes: HTN, COPD, SOB, sleep apnea, smoker, anxiety, Raynaud's syndrome, L ankle ORIF, and R THA.   Clinical Impression   Pt seen for OT evaluation this date, POD#1 from above surgery. Pt was independent in all ADL prior to surgery. Pt is eager to return to PLOF with less pain and improved safety and independence. Pt currently requires minimal assist for LB dressing and bathing while in seated position due to pain and limited AROM of L hip. Pt/girlfriend instructed in self care skills, falls prevention strategies, home/routines modifications, DME/AE for LB bathing and dressing tasks, and compression stocking mgt strategies. Handout provided to support recall and carryover. Pt/gf verbalized understanding. Pt would benefit from additional instruction in self care skills and techniques to help maintain precautions with or without assistive devices to support recall and carryover prior to discharge. Do not anticipate need for skilled OT services upon discharge.     Follow Up Recommendations  No OT follow up    Equipment Recommendations  Other (comment)(reacher)    Recommendations for Other Services       Precautions / Restrictions Precautions Precautions: Anterior Hip;Fall Restrictions Weight Bearing Restrictions: Yes LLE Weight Bearing: Weight bearing as tolerated      Mobility Bed Mobility Overal bed mobility: Needs Assistance Bed Mobility: Supine to Sit;Sit to Supine     Supine to sit: Supervision Sit to supine: Min assist   General bed mobility comments: min assist to elevate L LE over edge of bed with sit to supine.  Utilizes SPC to assist with L LE (self-assists), heavy use of bedrails for truncal  elevation.  Transfers Overall transfer level: Needs assistance Equipment used: Rolling walker (2 wheeled) Transfers: Sit to/from Stand Sit to Stand: Min guard         General transfer comment: excessive weight shift to R LE; heavy use of UEs to assist    Balance Overall balance assessment: Needs assistance Sitting-balance support: No upper extremity supported;Feet supported Sitting balance-Leahy Scale: Good     Standing balance support: Bilateral upper extremity supported Standing balance-Leahy Scale: Fair Standing balance comment: Heavy lean into RW, with min A to maintain balance.                           ADL either performed or assessed with clinical judgement   ADL Overall ADL's : Needs assistance/impaired                                       General ADL Comments: Min Afor LB ADL, girlfriend can provide needed level of assist     Vision Patient Visual Report: No change from baseline       Perception     Praxis      Pertinent Vitals/Pain Pain Assessment: 0-10 Pain Score: 6  Faces Pain Scale: Hurts little more Pain Location: L hip Pain Descriptors / Indicators: Sore;Burning;Aching Pain Intervention(s): Limited activity within patient's tolerance;Monitored during session;Premedicated before session;Repositioned;Ice applied     Hand Dominance Right   Extremity/Trunk Assessment Upper Extremity Assessment Upper Extremity Assessment: Overall WFL for tasks assessed   Lower Extremity  Assessment Lower Extremity Assessment: LLE deficits/detail;Defer to PT evaluation LLE Deficits / Details: L hip flex strength <3/5 LLE: Unable to fully assess due to pain LLE Sensation: WNL   Cervical / Trunk Assessment Cervical / Trunk Assessment: Normal   Communication Communication Communication: No difficulties   Cognition Arousal/Alertness: Awake/alert Behavior During Therapy: WFL for tasks assessed/performed Overall Cognitive Status:  Within Functional Limits for tasks assessed                                     General Comments       Exercises Other Exercises: Pt/gf instructed in falls prevention strategies, AE/DME, self care mgt skills, home/routines modifications, and compression stocking mgt; handout provided   Shoulder Instructions      Home Living Family/patient expects to be discharged to:: Private residence Living Arrangements: Children;Spouse/significant other(Lives with girlfriend and son) Available Help at Discharge: Family;Available 24 hours/day Type of Home: House Home Access: Stairs to enter [employer] of Steps: 3 Entrance Stairs-Rails: Left Home Layout: Two level;Able to live on main level with bedroom/bathroom     Bathroom Shower/Tub: Teacher, early years/pre: Standard     Home Equipment: Environmental consultant - 2 wheels;Bedside commode          Prior Functioning/Environment Level of Independence: Independent with assistive device(s)        Comments: Mod Ind amb with a SPC secondary to L hip pain with no fall history, Ind with ADLs        OT Problem List: Pain;Decreased strength;Decreased range of motion      OT Treatment/Interventions: Self-care/ADL training;Therapeutic exercise;Therapeutic activities;DME and/or AE instruction;Patient/family education;Balance training    OT Goals(Current goals can be found in the care plan section) Acute Rehab OT Goals Patient Stated Goal: To get stronger and walk better OT Goal Formulation: With patient/family Time For Goal Achievement: 02/26/19 Potential to Achieve Goals: Good ADL Goals Pt Will Perform Lower Body Dressing: sit to/from stand;with caregiver independent in assisting;with adaptive equipment Pt Will Transfer to Toilet: with supervision;ambulating(BSC over toilet, LRAD for amb) Additional ADL Goal #1: Pt will independently instruct family/caregiver in compression stocking mgt  OT Frequency: Min 1X/week    Barriers to D/C:            Co-evaluation              AM-PAC OT "6 Clicks" Daily Activity     Outcome Measure Help from another person eating meals?: None Help from another person taking care of personal grooming?: None Help from another person toileting, which includes using toliet, bedpan, or urinal?: A Little Help from another person bathing (including washing, rinsing, drying)?: A Little Help from another person to put on and taking off regular upper body clothing?: None Help from another person to put on and taking off regular lower body clothing?: A Little 6 Click Score: 21   End of Session    Activity Tolerance: Patient tolerated treatment well Patient left: in bed;with call bell/phone within reach;with bed alarm set;with SCD's reapplied;with family/visitor present  OT Visit Diagnosis: Other abnormalities of gait and mobility (R26.89);Pain Pain - Right/Left: Left Pain - part of body: Hip                Time: [id number] OT Time Calculation (min): 21 min Charges:  OT General Charges $OT Visit: 1 Visit OT Evaluation $OT Eval Low Complexity: 1 Low OT Treatments $Self Care/Home Management :  8-22 mins  Jeni Salles, MPH, MS, OTR/L ascom [phone] 02/12/19, 4:05 PM

## 2019-02-12 NOTE — Progress Notes (Signed)
Pt has talked about left knee pain history but has increased since surgical period. Ice applied and pt is already taking pain medication. Pt seems very concerned to what is wrong with his knee. Pt states he is in a lot of pain with left Knee.

## 2019-02-12 NOTE — Progress Notes (Signed)
Physical Therapy Treatment Patient Details Name: Harold Tate MRN: [id number] DOB: 12-24-1965 Today's Date: 02/12/2019    History of Present Illness Pt is a 53 yo male diagnosed with avascular necrosis of the left hip and is s/p elective L THA with anterior approach.  PMH includes: HTN, COPD, SOB, sleep apnea, smoker, anxiety, Raynaud's syndrome, L ankle ORIF, and R THA.    PT Comments    Improved pain control this PM; improved tolerance for and participation with mobility training.  Able to complete full lap around nursing station (200') with RW, cga from therapist; cuing for more fluid, continuous stepping pattern and L LE alignment (maintains L toe in throughout gait cycle). Heavy WBing on bilat UEs; does require multiple standing rest periods to complete distance (and prefers to maintain ace wrap around L knee).  Able to indep advance L LE this session. Remains generally self-directive of session at times, but more compliant with education/cuing (when given additional time to complete task and set up environment) and eager to progress towards discharge home.  Plan to initiate stair training next session as appropriate and patient able to tolerate.     Follow Up Recommendations  Home health PT;Supervision for mobility/OOB     Equipment Recommendations  (has RW, BSC)    Recommendations for Other Services       Precautions / Restrictions Precautions Precautions: Anterior Hip Precaution Booklet Issued: Yes (comment) Restrictions Weight Bearing Restrictions: Yes LLE Weight Bearing: Weight bearing as tolerated    Mobility  Bed Mobility Overal bed mobility: Needs Assistance Bed Mobility: Supine to Sit;Sit to Supine     Supine to sit: Supervision Sit to supine: Min assist   General bed mobility comments: min assist to elevate L LE over edge of bed with sit to supine.  Utilizes SPC to assist with L LE (self-assists), heavy use of bedrails for truncal  elevation.  Transfers Overall transfer level: Needs assistance Equipment used: Rolling walker (2 wheeled) Transfers: Sit to/from Stand Sit to Stand: Min guard         General transfer comment: excessive weight shift to R LE; heavy use of UEs to assist  Ambulation/Gait Ambulation/Gait assistance: Min guard Gait Distance (Feet): 200 Feet Assistive device: Rolling walker (2 wheeled) Gait Pattern/deviations: Step-through pattern;Decreased step length - right;Decreased step length - left;Antalgic;Shuffle Gait velocity: Decreased   General Gait Details: reciprocal stepping pattern with fair step height/length; able to indep advance L LE without assist this PM; does maintain excessive L hip IR throughout gait cycle (reports baseline/congenital pattern for him).  Slow and deliberate, multiple standing rest breaks to complete distance; significant WBing bilat UEs with RW anterior to patient.   Stairs             Wheelchair Mobility    Modified Rankin (Stroke Patients Only)       Balance Overall balance assessment: Needs assistance Sitting-balance support: No upper extremity supported;Feet supported Sitting balance-Leahy Scale: Good     Standing balance support: Bilateral upper extremity supported Standing balance-Leahy Scale: Fair Standing balance comment: Heavy lean into RW, with min A to maintain balance.                            Cognition Arousal/Alertness: Awake/alert Behavior During Therapy: WFL for tasks assessed/performed Overall Cognitive Status: Within Functional Limits for tasks assessed  Exercises Total Joint Exercises Ankle Circles/Pumps: AROM;Strengthening;Both;10 reps Straight Leg Raises: AROM;Both;5 reps;Strengthening Knee Flexion: AROM;Strengthening;Both;10 reps Marching in Standing: Strengthening;Both;5 reps;10 reps Other Exercises Other Exercises: Upper body dressing, set up/min  assist; lower body dressing, dep assist to thread over LEs (due to patient insisting of performing in standing), cga for sit/stand and static standing balance with RW    General Comments        Pertinent Vitals/Pain Pain Assessment: Faces Pain Score: 6  Faces Pain Scale: Hurts little more Pain Location: L hip Pain Descriptors / Indicators: Sore;Burning;Aching Pain Intervention(s): Limited activity within patient's tolerance;Monitored during session;Premedicated before session;Repositioned    Home Living                      Prior Function            PT Goals (current goals can now be found in the care plan section) Acute Rehab PT Goals Patient Stated Goal: To get stronger and walk better PT Goal Formulation: With patient Time For Goal Achievement: 02/24/19 Potential to Achieve Goals: Good Progress towards PT goals: Progressing toward goals    Frequency    BID      PT Plan Current plan remains appropriate    Co-evaluation              AM-PAC PT "6 Clicks" Mobility   Outcome Measure  Help needed turning from your back to your side while in a flat bed without using bedrails?: None Help needed moving from lying on your back to sitting on the side of a flat bed without using bedrails?: A Little Help needed moving to and from a bed to a chair (including a wheelchair)?: A Little Help needed standing up from a chair using your arms (e.g., wheelchair or bedside chair)?: A Little Help needed to walk in hospital room?: A Little Help needed climbing 3-5 steps with a railing? : A Little 6 Click Score: 19    End of Session Equipment Utilized During Treatment: Gait belt Activity Tolerance: Patient tolerated treatment well Patient left: in bed;with call bell/phone within reach;with bed alarm set Nurse Communication: Mobility status PT Visit Diagnosis: Other abnormalities of gait and mobility (R26.89);Muscle weakness (generalized) (M62.81);Pain Pain -  Right/Left: Left Pain - part of body: Hip;Knee     Time: [id number] PT Time Calculation (min) (ACUTE ONLY): 56 min  Charges:  $Gait Training: 23-37 mins $Therapeutic Exercise: 8-22 mins $Therapeutic Activity: 23-37 mins                     Lian Tanori H. Owens Shark, PT, DPT, NCS 02/12/19, 3:15 PM [phone]

## 2019-02-12 NOTE — Progress Notes (Signed)
   Subjective: 1 Day Post-Op Procedure(s) (LRB): TOTAL HIP ARTHROPLASTY ANTERIOR APPROACH (Left) Patient reports pain as moderate.   Patient complains of left knee pain, states has a history of meniscal tear years ago.  No significant swelling throughout the left knee. Denies any CP, SOB, ABD pain. We will continue therapy today.   Objective: Vital signs in last 24 hours: Temp:  [97.1 F (36.2 C)-98.9 F (37.2 C)] 98.8 F (37.1 C) (10/16 0823) Pulse Rate:  [78-113] 98 (10/16 0823) Resp:  [16-22] 18 (10/16 0823) BP: (105-199)/(73-107) 135/89 (10/16 0823) SpO2:  [91 %-99 %] 91 % (10/16 0823)  Intake/Output from previous day: 10/15 0701 - 10/16 0700 In: 3636.1 [I.V.:3136.1; IV Piggyback:500] Out: [id number] [Urine:3350; Blood:200] Intake/Output this shift: No intake/output data recorded.  Recent Labs    02/11/19 1410 02/12/19 0418  HGB 15.6 14.8   Recent Labs    02/11/19 1410 02/12/19 0418  WBC 11.9* 5.1  RBC 4.52 4.33  HCT 44.9 42.4  PLT 169 139*   Recent Labs    02/11/19 1410 02/12/19 0418  NA  --  133*  K  --  4.1  CL  --  97*  CO2  --  28  BUN  --  7  CREATININE 0.71 0.74  GLUCOSE  --  136*  CALCIUM  --  8.4*   No results for input(s): LABPT, INR in the last 72 hours.  EXAM General - Patient is Alert, Appropriate and Oriented Extremity - Sensation intact distally Intact pulses distally Dorsiflexion/Plantar flexion intact No cellulitis present Compartment soft Dressing - dressing C/D/I and no drainage, Praveena intact without drainage Motor Function - intact, moving foot and toes well on exam.   Past Medical History:  Diagnosis Date  . Anxiety   . Arthritis   . Diabetes mellitus without complication ([place])   . Dyspnea   . Hip pain   . Hypertension   . Raynaud's syndrome   . Sleep apnea     CPAP/ Doesn't use    Assessment/Plan:   1 Day Post-Op Procedure(s) (LRB): TOTAL HIP ARTHROPLASTY ANTERIOR APPROACH (Left) Active Problems:   Status post  total hip replacement, left  Estimated body mass index is 31.47 kg/m as calculated from the following:   Height as of this encounter: 6\' 1"  (1.854 m).   Weight as of this encounter: 108.2 kg. Advance diet Up with therapy  Work on bowel movement Labs are stable, hemoglobin 14.8  Vital signs stable, slightly hypertensive this morning with oxygen saturations around lower 90s.  Encourage incentive spirometer.  Encouraged scheduled pain medications.  Continue to closely monitor vital signs.  In regards to left knee pain, could be flareup with his previous meniscal injury from years ago or could be referred pain from his hip surgery.  We will continue to monitor and see how he does with physical therapy today.  Care management to assist with discharge.  DVT Prophylaxis - Lovenox, TED hose and SCDs Weight-Bearing as tolerated to left leg   T. Rachelle Hora, PA-C [place] 02/12/2019, 8:24 AM

## 2019-02-12 NOTE — TOC Initial Note (Signed)
Transition of Care [facility]) - Initial/Assessment Note    Patient Details  Name: Harold Tate MRN: [id number] Date of Birth: 12-01-1965  Transition of Care [facility]) CM/SW Contact:    Su Hilt, RN Phone Number: 02/12/2019, 9:06 AM  Clinical Narrative:                 Met with the patient to discuss DC plan and needs He has DME at home including RW and [facility] and does not need additional He used [facility] in the past and wants to again, He requested Lennette Bihari I notified Helene Kelp at [place], He has transportation with his girlfriend that he lives with He can afford his medications and the lovenox is $0, I notified the patient of no cost He is up to date with his PCP No additional needs at this time Expected Discharge Plan: [place] Barriers to Discharge: Continued Medical Work up   Patient Goals and CMS Choice Patient states their goals for this hospitalization and ongoing recovery are:: Go home and get better CMS Medicare.gov Compare Post Acute Care list provided to:: Patient Choice offered to / list presented to : Patient  Expected Discharge Plan and Services Expected Discharge Plan: [place]   Discharge Planning Services: CM Consult Post Acute Care Choice: [place] arrangements for the past 2 months: Single Family Home                 DME Arranged: N/A         HH Arranged: PT HH Agency: [facility] [facility] [employer] Date [place]: 02/12/19 Time [place]: [phone] Representative spoke with at [place]: [place] Arrangements/Services Living arrangements for the past 2 months: [place] with:: Spouse, Minor Children Patient language and need for interpreter reviewed:: Yes Do you feel safe going back to the place where you live?: Yes      Need for Family Participation in Patient Care: No (Comment) Care giver support system in place?: Yes (comment) Current home  services: DME([facility], RW) Criminal Activity/Legal Involvement Pertinent to Current Situation/Hospitalization: No - Comment as needed  Activities of Daily Living Home Assistive Devices/Equipment: Cane (specify quad or straight) ADL Screening (condition at time of admission) Patient's cognitive ability adequate to safely complete daily activities?: Yes Is the patient deaf or have difficulty hearing?: No Does the patient have difficulty seeing, even when wearing glasses/contacts?: No Does the patient have difficulty concentrating, remembering, or making decisions?: No Patient able to express need for assistance with ADLs?: Yes Does the patient have difficulty dressing or bathing?: No Independently performs ADLs?: Yes (appropriate for developmental age) Does the patient have difficulty walking or climbing stairs?: Yes Weakness of Legs: Right Weakness of Arms/Hands: None  Permission Sought/Granted   Permission granted to share information with : Yes, Verbal Permission Granted              Emotional Assessment Appearance:: Appears stated age Attitude/Demeanor/Rapport: Engaged Affect (typically observed): Appropriate Orientation: : Oriented to Self, Oriented to Place, Oriented to  Time, Oriented to Situation Alcohol / Substance Use: Not Applicable Psych Involvement: No (comment)  Admission diagnosis:  PRIMARY OSTEOARTHRITIS OF LEFT HIP Patient Active Problem List   Diagnosis Date Noted  . Status post total hip replacement, left 02/11/2019  . Depression, major, single episode, moderate ([place]) 01/11/2019  . Sexual dysfunction 11/19/2018  . Status post total hip replacement, right 11/03/2018  . Osteoarthritis of  both hips 10/01/2018  . Anxiety 07/21/2018  . Bunion of great toe of right foot 06/18/2017  . Diabetes mellitus associated with hormonal etiology ([place]) 11/05/2016  . Post-traumatic osteoarthritis of ankle 08/12/2016  . Hypercholesterolemia 06/06/2016  . Raynaud's syndrome  09/30/2014  . Generalized anxiety disorder 09/30/2014  . Tobacco dependence syndrome 09/30/2014  . Benign hypertension 09/30/2014  . Deep vein thrombosis of left lower limb ([place]) 09/30/2014  . Osteomyelitis ([place]) 09/07/2013  . Lesion of ulnar nerve 05/11/2013  . Ulnar nerve palsy 04/08/2013   PCP:  Guadalupe Maple, MD Pharmacy:   [place], [place] - [address] [address] Phone: [phone] Fax: [phone]     Social Determinants of Health (SDOH) Interventions    Readmission Risk Interventions No flowsheet data found.

## 2019-02-12 NOTE — Anesthesia Postprocedure Evaluation (Signed)
Anesthesia Post Note  Patient: Harold Tate  Procedure(s) Performed: TOTAL HIP ARTHROPLASTY ANTERIOR APPROACH (Left Hip)  Patient location during evaluation: Nursing Unit Anesthesia Type: Spinal Level of consciousness: oriented and awake and alert Pain management: pain level controlled Vital Signs Assessment: post-procedure vital signs reviewed and stable Respiratory status: spontaneous breathing and respiratory function stable Cardiovascular status: blood pressure returned to baseline and stable Postop Assessment: no headache, no backache, no apparent nausea or vomiting and patient able to bend at knees Anesthetic complications: no     Last Vitals:  Vitals:   02/12/19 0435 02/12/19 0823  BP: (!) 147/94 135/89  Pulse: (!) 101 98  Resp: 19 18  Temp: 37.1 C 37.1 C  SpO2: 93% 91%    Last Pain:  Vitals:   02/12/19 0823  TempSrc: Oral  PainSc:                  Brantley Fling

## 2019-02-13 MED ORDER — ENOXAPARIN SODIUM 40 MG/0.4ML ~~LOC~~ SOLN: 40.0000 mg | SUBCUTANEOUS | 0 refills | Status: DC

## 2019-02-13 MED ORDER — DOCUSATE SODIUM 100 MG PO CAPS: 100.0000 mg | ORAL_CAPSULE | Freq: Two times a day (BID) | ORAL | 0 refills | Status: DC

## 2019-02-13 MED ORDER — OXYCODONE HCL 10 MG PO TABS: 10.0000 mg | ORAL_TABLET | ORAL | 0 refills | Status: DC | PRN

## 2019-02-13 NOTE — Discharge Instructions (Signed)
ANTERIOR APPROACH TOTAL HIP REPLACEMENT POSTOPERATIVE DIRECTIONS   Hip Rehabilitation, Guidelines Following Surgery  The results of a hip operation are greatly improved after range of motion and muscle strengthening exercises. Follow all safety measures which are given to protect your hip. If any of these exercises cause increased pain or swelling in your joint, decrease the amount until you are comfortable again. Then slowly increase the exercises. Call your caregiver if you have problems or questions.   HOME CARE INSTRUCTIONS  Remove items at home which could result in a fall. This includes throw rugs or furniture in walking pathways.   ICE to the affected hip every three hours for 30 minutes at a time and then as needed for pain and swelling.  Continue to use ice on the hip for pain and swelling from surgery. You may notice swelling that will progress down to the foot and ankle.  This is normal after surgery.  Elevate the leg when you are not up walking on it.    Continue to use the breathing machine which will help keep your temperature down.  It is common for your temperature to cycle up and down following surgery, especially at night when you are not up moving around and exerting yourself.  The breathing machine keeps your lungs expanded and your temperature down.  Do not place pillow under knee, focus on keeping the knee straight while resting  DIET You may resume your previous home diet once your are discharged from the hospital.  DRESSING / WOUND CARE / SHOWERING Please remove provena negative pressure dressing on 02/20/2019 and apply honey comb dressing. Keep dressing clean and dry at all times.   ACTIVITY Walk with your walker as instructed. Use walker as long as suggested by your caregivers. Avoid periods of inactivity such as sitting longer than an hour when not asleep. This helps prevent blood clots.  You may resume a sexual relationship in one month or when given the OK by  your doctor.  You may return to work once you are cleared by your doctor.  Do not drive a car for 6 weeks or until released by you surgeon.  Do not drive while taking narcotics.  WEIGHT BEARING Weight bearing as tolerated. Use walker/cane as needed for at least 4 weeks post op.  POSTOPERATIVE CONSTIPATION PROTOCOL Constipation - defined medically as fewer than three stools per week and severe constipation as less than one stool per week.  One of the most common issues patients have following surgery is constipation.  Even if you have a regular bowel pattern at home, your normal regimen is likely to be disrupted due to multiple reasons following surgery.  Combination of anesthesia, postoperative narcotics, change in appetite and fluid intake all can affect your bowels.  In order to avoid complications following surgery, here are some recommendations in order to help you during your recovery period.  Colace (docusate) - Pick up an over-the-counter form of Colace or another stool softener and take twice a day as long as you are requiring postoperative pain medications.  Take with a full glass of water daily.  If you experience loose stools or diarrhea, hold the colace until you stool forms back up.  If your symptoms do not get better within 1 week or if they get worse, check with your doctor.  Dulcolax (bisacodyl) - Pick up over-the-counter and take as directed by the product packaging as needed to assist with the movement of your bowels.  Take with a  full glass of water.  Use this product as needed if not relieved by Colace only.  ° °MiraLax (polyethylene glycol) - Pick up over-the-counter to have on hand.  MiraLax is a solution that will increase the amount of water in your bowels to assist with bowel movements.  Take as directed and can mix with a glass of water, juice, soda, coffee, or tea.  Take if you go more than two days without a movement. °Do not use MiraLax more than once per day. Call your  doctor if you are still constipated or irregular after using this medication for 7 days in a row. ° °If you continue to have problems with postoperative constipation, please contact the office for further assistance and recommendations.  If you experience "the worst abdominal pain ever" or develop nausea or vomiting, please contact the office immediatly for further recommendations for treatment. ° °ITCHING ° If you experience itching with your medications, try taking only a single pain pill, or even half a pain pill at a time.  You can also use Benadryl over the counter for itching or also to help with sleep.  ° °TED HOSE STOCKINGS °Wear the elastic stockings on both legs for six weeks following surgery during the day but you may remove then at night for sleeping. ° °MEDICATIONS °See your medication summary on the “After Visit Summary” that the nursing staff will review with you prior to discharge.  You may have some home medications which will be placed on hold until you complete the course of blood thinner medication.  It is important for you to complete the blood thinner medication as prescribed by your surgeon.  Continue your approved medications as instructed at time of discharge. ° °PRECAUTIONS °If you experience chest pain or shortness of breath - call 911 immediately for transfer to the hospital emergency department.  °If you develop a fever greater that 101 F, purulent drainage from wound, increased redness or drainage from wound, foul odor from the wound/dressing, or calf pain - CONTACT YOUR SURGEON.   °                                                °FOLLOW-UP APPOINTMENTS °Make sure you keep all of your appointments after your operation with your surgeon and caregivers. You should call the office at the above phone number and make an appointment for approximately two weeks after the date of your surgery or on the date instructed by your surgeon outlined in the "After Visit Summary". ° °RANGE OF MOTION  AND STRENGTHENING EXERCISES  °These exercises are designed to help you keep full movement of your hip joint. Follow your caregiver's or physical therapist's instructions. Perform all exercises about fifteen times, three times per day or as directed. Exercise both hips, even if you have had only one joint replacement. These exercises can be done on a training (exercise) mat, on the floor, on a table or on a bed. Use whatever works the best and is most comfortable for you. Use music or television while you are exercising so that the exercises are a pleasant break in your day. This will make your life better with the exercises acting as a break in routine you can look forward to.  °Lying on your back, slowly slide your foot toward your buttocks, raising your knee up off the floor. Then   slowly slide your foot back down until your leg is straight again.  °Lying on your back spread your legs as far apart as you can without causing discomfort.  °Lying on your side, raise your upper leg and foot straight up from the floor as far as is comfortable. Slowly lower the leg and repeat.  °Lying on your back, tighten up the muscle in the front of your thigh (quadriceps muscles). You can do this by keeping your leg straight and trying to raise your heel off the floor. This helps strengthen the largest muscle supporting your knee.  °Lying on your back, tighten up the muscles of your buttocks both with the legs straight and with the knee bent at a comfortable angle while keeping your heel on the floor.  ° °IF YOU ARE TRANSFERRED TO A SKILLED REHAB FACILITY °If the patient is transferred to a skilled rehab facility following release from the hospital, a list of the current medications will be sent to the facility for the patient to continue.  When discharged from the skilled rehab facility, please have the facility set up the patient's Home Health Physical Therapy prior to being released. Also, the skilled facility will be responsible  for providing the patient with their medications at time of release from the facility to include their pain medication, the muscle relaxants, and their blood thinner medication. If the patient is still at the rehab facility at time of the two week follow up appointment, the skilled rehab facility will also need to assist the patient in arranging follow up appointment in our office and any transportation needs. ° °MAKE SURE YOU:  °Understand these instructions.  °Get help right away if you are not doing well or get worse.  ° ° °Pick up stool softner and laxative for home use following surgery while on pain medications. °Continue to use ice for pain and swelling after surgery. °Do not use any lotions or creams on the incision until instructed by your surgeon. ° °

## 2019-02-13 NOTE — Discharge Summary (Signed)
Physician Discharge Summary  Patient ID: Harold Tate MRN: [id number] DOB/AGE: 1965/07/21 53 y.o.  Admit date: 02/11/2019 Discharge date: 02/13/2019  Admission Diagnoses:  PRIMARY OSTEOARTHRITIS OF LEFT HIP   Discharge Diagnoses: Patient Active Problem List   Diagnosis Date Noted  . Status post total hip replacement, left 02/11/2019  . Depression, major, single episode, moderate ([place]) 01/11/2019  . Sexual dysfunction 11/19/2018  . Status post total hip replacement, right 11/03/2018  . Osteoarthritis of both hips 10/01/2018  . Anxiety 07/21/2018  . Bunion of great toe of right foot 06/18/2017  . Diabetes mellitus associated with hormonal etiology ([place]) 11/05/2016  . Post-traumatic osteoarthritis of ankle 08/12/2016  . Hypercholesterolemia 06/06/2016  . Raynaud's syndrome 09/30/2014  . Generalized anxiety disorder 09/30/2014  . Tobacco dependence syndrome 09/30/2014  . Benign hypertension 09/30/2014  . Deep vein thrombosis of left lower limb ([place]) 09/30/2014  . Osteomyelitis ([place]) 09/07/2013  . Lesion of ulnar nerve 05/11/2013  . Ulnar nerve palsy 04/08/2013    Past Medical History:  Diagnosis Date  . Anxiety   . Arthritis   . Diabetes mellitus without complication ([place])   . Dyspnea   . Hip pain   . Hypertension   . Raynaud's syndrome   . Sleep apnea     CPAP/ Doesn't use     Transfusion: None   Consultants (if any):   Discharged Condition: Improved  Hospital Course: DIETRICK KOZINSKI is an 53 y.o. male who was admitted 02/11/2019 with a diagnosis of left hip osteoarthritis and went to the operating room on 02/11/2019 and underwent the above named procedures.    Surgeries: Procedure(s): TOTAL HIP ARTHROPLASTY ANTERIOR APPROACH on 02/11/2019 Patient tolerated the surgery well. Taken to PACU where she was stabilized and then transferred to the orthopedic floor.  Started on Lovenox 40 mg q 24 hrs. Foot pumps applied bilaterally at 80 mm. Heels elevated on bed with  rolled towels. No evidence of DVT. Negative [place]. Physical therapy started on day #1 for gait training and transfer. OT started day #1 for ADL and assisted devices.  Patient's foley was d/c on day #1. Patient's IV was d/c on day #2.  On post op day #2 patient was stable and ready for discharge to home with home health PT.  Implants: Medacta AMIS 5 standard stem, 54 mm Mpact DM cup and liner with ceramic S 28 mm head  He was given perioperative antibiotics:  Anti-infectives (From admission, onward)   Start     Dose/Rate Route Frequency Ordered Stop   02/11/19 1600  ceFAZolin (ANCEF) IVPB 2g/100 mL premix     2 g 200 mL/hr over 30 Minutes Intravenous Every 6 hours 02/11/19 1334 02/12/19 0830   02/11/19 0803  ceFAZolin (ANCEF) 2-4 GM/100ML-% IVPB    Note to Pharmacy: Register, Karen   : cabinet override      02/11/19 0803 02/11/19 0938   02/11/19 0700  ceFAZolin (ANCEF) IVPB 2g/100 mL premix     2 g 200 mL/hr over 30 Minutes Intravenous  Once 02/11/19 0652 02/11/19 1007    .  He was given sequential compression devices, early ambulation, and Lovenox and teds for DVT prophylaxis.  He benefited maximally from the hospital stay and there were no complications.    Recent vital signs:  Vitals:   02/12/19 2330 02/13/19 0820  BP: 109/65 116/74  Pulse: 99 82  Resp: 19 18  Temp: 98.5 F (36.9 C)   SpO2: 93% 93%    Recent laboratory studies:  Lab Results  Component Value Date   HGB 14.8 02/12/2019   HGB 15.6 02/11/2019   HGB 17.2 (H) 02/04/2019   Lab Results  Component Value Date   WBC 5.1 02/12/2019   PLT 139 (L) 02/12/2019   Lab Results  Component Value Date   INR 0.9 02/04/2019   Lab Results  Component Value Date   NA 133 (L) 02/12/2019   K 4.1 02/12/2019   CL 97 (L) 02/12/2019   CO2 28 02/12/2019   BUN 7 02/12/2019   CREATININE 0.74 02/12/2019   GLUCOSE 136 (H) 02/12/2019    Discharge Medications:   Allergies as of 02/13/2019      Reactions   Chlorhexidine        Medication List    STOP taking these medications   BC Fast Pain Relief 650-195-33.3 MG Pack Generic drug: Aspirin-Salicylamide-Caffeine     TAKE these medications   acetaminophen 325 MG tablet Commonly known as: TYLENOL Take 1-2 tablets (325-650 mg total) by mouth every 6 (six) hours as needed for mild pain (pain score 1-3 or temp > 100.5).   benazepril 40 MG tablet Commonly known as: LOTENSIN Take 1 tablet (40 mg total) by mouth daily.   clonazePAM 0.5 MG tablet Commonly known as: KLONOPIN Take 1 tablet (0.5 mg total) by mouth 3 (three) times daily as neededfor anxiety. What changed:   how much to take  how to take this  when to take this  additional instructions   docusate sodium 100 MG capsule Commonly known as: COLACE Take 1 capsule (100 mg total) by mouth 2 (two) times daily.   enoxaparin 40 MG/0.4ML injection Commonly known as: LOVENOX Inject 0.4 mLs (40 mg total) into the skin daily for 14 days.   FLUoxetine 20 MG tablet Commonly known as: PROZAC Take 1 tablet (20 mg total) by mouth daily.   methocarbamol 500 MG tablet Commonly known as: ROBAXIN Take 1 tablet (500 mg total) by mouth every 6 (six) hours as needed for muscle spasms.   Oxycodone HCl 10 MG Tabs Take 1-1.5 tablets (10-15 mg total) by mouth every 4 (four) hours as needed for severe pain (pain score 7-10). What changed:   medication strength  how much to take  reasons to take this       Diagnostic Studies: Dg Hip Operative Unilat W Or W/o Pelvis Left  Result Date: 02/11/2019 CLINICAL DATA:  Intraoperative imaging for left hip replacement. EXAM: OPERATIVE LEFT HIP (WITH PELVIS IF PERFORMED) 1 VIEWS TECHNIQUE: Fluoroscopic spot image(s) were submitted for interpretation post-operatively. COMPARISON:  None. FINDINGS: Single fluoroscopic spot view of the left hip is provided. Total arthroplasty is in place. No acute finding is seen. The femoral stem is incompletely visualized.  IMPRESSION: Intraoperative image for left hip replacement. Electronically Signed   By: Inge Rise M.D.   On: 02/11/2019 11:15   Dg Hip Unilat W Or W/o Pelvis 2-3 Views Left  Result Date: 02/11/2019 CLINICAL DATA:  Post op TOTAL HIP ARTHROPLASTY ANTERIOR APPROACH (Left Hip). EXAM: DG HIP (WITH OR WITHOUT PELVIS) 2-3V LEFT COMPARISON:  None applicable FINDINGS: Status post LEFT hip arthroplasty. Surgical clips and surgical drain overlie the soft tissues LATERAL to the hip. No evidence for dislocation. Hardware is intact. IMPRESSION: Status post LEFT hip arthroplasty. Electronically Signed   By: Nolon Nations M.D.   On: 02/11/2019 12:01    Disposition:     Follow-up Information    Duanne Guess, PA-C Follow up in 2 week(s).  Specialties: Orthopedic Surgery, Emergency Medicine Contact information: [place] [address] [phone]            Signed: Feliberto Gottron 02/13/2019, 8:38 AM

## 2019-02-13 NOTE — Progress Notes (Signed)
MD Rudene Christians notified via secure chat about patient refusing milk og magnesia for a bowel movement states that he knows he will have a bowel movement at home.

## 2019-02-13 NOTE — Progress Notes (Signed)
Patient had no drainage from woundvac

## 2019-02-13 NOTE — Progress Notes (Signed)
   Subjective: 2 Days Post-Op Procedure(s) (LRB): TOTAL HIP ARTHROPLASTY ANTERIOR APPROACH (Left) Patient reports pain as mild.  No complaints this am. Left knee pain much improved with ace wrap yesterday. Denies any CP, SOB, ABD pain. We will continue therapy today.   Objective: Vital signs in last 24 hours: Temp:  [98.5 F (36.9 C)] 98.5 F (36.9 C) (10/16 2330) Pulse Rate:  [82-99] 82 (10/17 0820) Resp:  [18-19] 18 (10/17 0820) BP: (95-128)/(65-83) 116/74 (10/17 0820) SpO2:  [93 %-98 %] 93 % (10/17 0820)  Intake/Output from previous day: 10/16 0701 - 10/17 0700 In: 180 [P.O.:180] Out: 1325 [Urine:1325] Intake/Output this shift: No intake/output data recorded.  Recent Labs    02/11/19 1410 02/12/19 0418  HGB 15.6 14.8   Recent Labs    02/11/19 1410 02/12/19 0418  WBC 11.9* 5.1  RBC 4.52 4.33  HCT 44.9 42.4  PLT 169 139*   Recent Labs    02/11/19 1410 02/12/19 0418  NA  --  133*  K  --  4.1  CL  --  97*  CO2  --  28  BUN  --  7  CREATININE 0.71 0.74  GLUCOSE  --  136*  CALCIUM  --  8.4*   No results for input(s): LABPT, INR in the last 72 hours.  EXAM General - Patient is Alert, Appropriate and Oriented Extremity - Sensation intact distally Intact pulses distally Dorsiflexion/Plantar flexion intact No cellulitis present Compartment soft Dressing - dressing C/D/I and no drainage, Praveena intact without drainage Motor Function - intact, moving foot and toes well on exam.   Past Medical History:  Diagnosis Date  . Anxiety   . Arthritis   . Diabetes mellitus without complication ([place])   . Dyspnea   . Hip pain   . Hypertension   . Raynaud's syndrome   . Sleep apnea     CPAP/ Doesn't use    Assessment/Plan:   2 Days Post-Op Procedure(s) (LRB): TOTAL HIP ARTHROPLASTY ANTERIOR APPROACH (Left) Active Problems:   Status post total hip replacement, left  Estimated body mass index is 31.47 kg/m as calculated from the following:   Height as  of this encounter: 6\' 1"  (1.854 m).   Weight as of this encounter: 108.2 kg. Advance diet Up with therapy, WBAT LLE Vital signs stable Pain well controlled Care management to assist with discharge to home with HHPT today pending completion of PT goals (stairs), and BM.  DVT Prophylaxis - Lovenox, TED hose and SCDs Weight-Bearing as tolerated to left leg   T. Rachelle Hora, PA-C [place] 02/13/2019, 8:28 AM

## 2019-02-13 NOTE — Progress Notes (Signed)
Discharge instructions reviewed with patient and girlfriend. They verbalized understanding. IV removed. Woundvac changed.  Stable condition. He is eating his lunch then he will be ready to get dressed.

## 2019-02-13 NOTE — Progress Notes (Signed)
Physical Therapy Treatment Patient Details Name: Harold Tate MRN: [id number] DOB: Sep 22, 1965 Today's Date: 02/13/2019    History of Present Illness Pt is a 53 yo male diagnosed with avascular necrosis of the left hip and is s/p elective L THA with anterior approach.  PMH includes: HTN, COPD, SOB, sleep apnea, smoker, anxiety, Raynaud's syndrome, L ankle ORIF, and R THA.    PT Comments    Pt agreeable to PT; reports 8/10 burning pain in L thigh via faces scale with much grimacing/moaning throughout session. Pt eager to perform all PT interventions. Pt does take significant increased time for all activity. Demonstrates in/out bed mobility with primarily self help using cane to advance LLE; sit to supine does require Min A ultimately. Pt notes a number of previous injuries/surgeries causing difficulty with LLE (sciatica pain/ankle injury). LLE noted to be significantly internally rotated in stand, which may be more from the ankle, as L knee more forward facing. STS to from numerous surfaces; Min guard for elevated surface and Min A from low surface. Pt request L knee ace wrap prior to stand/ambulation activities. Ambulation of 180 ft with several stand rest breaks due to burning quad pain. Pt requires Min guard for safety with varying speeds and heavy lean on rolling walker. Educated and encouraged pt to prioritize safety over speed. Pt requires directional, sequence and safety cues for chair approach. Lengthy seated rest prior to stair climbing. Pt educated on sequence/technique for stair climbing and does so with Min A and heavy lean on rails. Seated rest post stair climbing with ultimate need for return to room via w/c. Ambulation for 40 ft to from bathroom. Set up/supervision for toileting with Min A to don undergarment fully post toileting. Min guard/supervision during hand hygiene. Pt requesting pain medication and contacted nurse. Pt prepared to discharge home with HHPT.   Follow Up  Recommendations  Home health PT;Supervision for mobility/OOB     Equipment Recommendations  None recommended by PT    Recommendations for Other Services       Precautions / Restrictions Precautions Precautions: Anterior Hip;Fall Restrictions Weight Bearing Restrictions: Yes LLE Weight Bearing: Weight bearing as tolerated    Mobility  Bed Mobility Overal bed mobility: Needs Assistance Bed Mobility: Supine to Sit;Sit to Supine     Supine to sit: Supervision Sit to supine: Min assist   General bed mobility comments: Pt uses cane to self assist LLE out of bed; attempts same technique to return to bed requiring Min A ultimately for LLE to clear into bed.   Transfers Overall transfer level: Needs assistance Equipment used: Rolling walker (2 wheeled) Transfers: Sit to/from Stand Sit to Stand: Min guard;Min assist         General transfer comment: Performed from several different surfaces/heights. Min guard from slight elevated bed to mimic home set up and from elevated toilet seat to stand; Min A to sit. Min guard from elevated plinth. Min A from w/c. Safety cues for hand placement intermittently throughout and improved safety with chair approach  Ambulation/Gait Ambulation/Gait assistance: Min guard Gait Distance (Feet): 180 Feet(40 ft second walk into room/bathroom and return to bed) Assistive device: Rolling walker (2 wheeled) Gait Pattern/deviations: Step-through pattern;Decreased stride length;Decreased weight shift to left;Decreased dorsiflexion - left Gait velocity: Decreased Gait velocity interpretation: (Varying speeds; encouraged safety over speed) General Gait Details: LLE held in significant IR more so from ankle. Ace wrap donned per pt request to L knee for support. Heavy lean on rw with increased  burning pain/moaning due to L quad/lateral knee pain. Several stand rest breaks during ambulation. Seated rest post ambulation prior to steps. Stiff legged on L with  decreased foot clearance   Stairs Stairs: Yes Stairs assistance: Min assist Stair Management: Two rails;Step to pattern;Forwards Number of Stairs: 3 General stair comments: Lengthy seated rest pre/post steps. Education on proper technique. Demonstrates well overall with Min A. Stiff legged on L making ascend mildly more difficult.    Wheelchair Mobility    Modified Rankin (Stroke Patients Only)       Balance Overall balance assessment: Needs assistance Sitting-balance support: Bilateral upper extremity supported;Feet supported Sitting balance-Leahy Scale: Good     Standing balance support: Bilateral upper extremity supported Standing balance-Leahy Scale: Fair                              Cognition Arousal/Alertness: Awake/alert Behavior During Therapy: WFL for tasks assessed/performed(initially agitated/anxious/impulsive-Improves) Overall Cognitive Status: Within Functional Limits for tasks assessed                                 General Comments: Pt frustrated with burning pain. Pt very particularly minded; does require some repetitive safety cues throughout       Exercises Other Exercises Other Exercises: Education regarding anterior hip precautions, car transfers as well as current expectations in acute phase post surgery (pt frustrated and has greater expectations at this stage) Other Exercises: Set up/supervision with toileting and hand hygiene    General Comments        Pertinent Vitals/Pain Pain Assessment: Faces Faces Pain Scale: Hurts whole lot Pain Location: L thigh Pain Descriptors / Indicators: Constant;Burning Pain Intervention(s): Monitored during session;Premedicated before session;Patient requesting pain meds-RN notified(states last medication 5:51)    Home Living                      Prior Function            PT Goals (current goals can now be found in the care plan section) Progress towards PT goals:  Progressing toward goals    Frequency    BID      PT Plan Current plan remains appropriate    Co-evaluation              AM-PAC PT "6 Clicks" Mobility   Outcome Measure  Help needed turning from your back to your side while in a flat bed without using bedrails?: None Help needed moving from lying on your back to sitting on the side of a flat bed without using bedrails?: A Little Help needed moving to and from a bed to a chair (including a wheelchair)?: A Little Help needed standing up from a chair using your arms (e.g., wheelchair or bedside chair)?: A Little Help needed to walk in hospital room?: A Little Help needed climbing 3-5 steps with a railing? : A Little 6 Click Score: 19    End of Session Equipment Utilized During Treatment: Gait belt Activity Tolerance: Patient limited by pain;Patient tolerated treatment well Patient left: in bed;with call bell/phone within reach;with nursing/sitter in room;with SCD's reapplied   PT Visit Diagnosis: Other abnormalities of gait and mobility (R26.89);Muscle weakness (generalized) (M62.81);Pain Pain - Right/Left: Left Pain - part of body: Hip;Knee     Time: [id number] PT Time Calculation (min) (ACUTE ONLY): 76 min  Charges:  $Gait Training: 23-37  mins $Therapeutic Exercise: 8-22 mins $Therapeutic Activity: 23-37 mins                      Larae Grooms, PTA 02/13/2019, 12:25 PM

## 2019-02-13 NOTE — TOC Transition Note (Signed)
Transition of Care [facility]) - CM/SW Discharge Note   Patient Details  Name: CHESS TREVETT MRN: [id number] Date of Birth: 10-20-1965  [facility]) CM/SW Contact:  Latanya Maudlin, RN Phone Number: 02/13/2019, 8:54 AM   Clinical Narrative: Patient to be discharged per MD order. Orders in place for home health services. Patient was set up previously with [facility]. Notified Helene Kelp at [facility] of pending discharge. Noted that there are no DME needs. Family to transport.      Final next level of care: [place] Barriers to Discharge: No Barriers Identified   Patient Goals and CMS Choice Patient states their goals for this hospitalization and ongoing recovery are:: Go home and get better CMS Medicare.gov Compare Post Acute Care list provided to:: Patient Choice offered to / list presented to : Patient  Discharge Placement                       Discharge Plan and Services   Discharge Planning Services: CM Consult Post Acute Care Choice: Home Health          DME Arranged: N/A         HH Arranged: PT [place] Agency: [facility] (formerly [employer]) Date [place]: 02/13/19 Time [place]: [phone] Representative spoke with at [place]: [place] ([place]) Interventions     Readmission Risk Interventions Readmission Risk Prevention Plan 02/13/2019  Post Dischage Appt Complete  Medication Screening Complete  Transportation Screening Complete  Some recent data might be hidden

## 2019-03-09 ENCOUNTER — Other Ambulatory Visit: Payer: Self-pay

## 2019-03-09 ENCOUNTER — Encounter: Payer: Self-pay | Admitting: Nurse Practitioner

## 2019-03-09 ENCOUNTER — Ambulatory Visit (INDEPENDENT_AMBULATORY_CARE_PROVIDER_SITE_OTHER): Payer: BLUE CROSS/BLUE SHIELD | Admitting: Nurse Practitioner

## 2019-03-09 DIAGNOSIS — E1169 Type 2 diabetes mellitus with other specified complication: Secondary | ICD-10-CM | POA: Diagnosis not present

## 2019-03-09 DIAGNOSIS — F419 Anxiety disorder, unspecified: Secondary | ICD-10-CM | POA: Diagnosis not present

## 2019-03-09 DIAGNOSIS — I10 Essential (primary) hypertension: Secondary | ICD-10-CM | POA: Diagnosis not present

## 2019-03-09 DIAGNOSIS — E7800 Pure hypercholesterolemia, unspecified: Secondary | ICD-10-CM

## 2019-03-09 DIAGNOSIS — F321 Major depressive disorder, single episode, moderate: Secondary | ICD-10-CM

## 2019-03-09 MED ORDER — CLONAZEPAM 0.5 MG PO TABS: ORAL_TABLET | ORAL | 3 refills | Status: DC

## 2019-03-09 NOTE — Assessment & Plan Note (Signed)
Chronic, ongoing.  Refuses statin, will continue to reiterate the importance of statin based on ASCVD score.  Recommend statin use to reduce risks.

## 2019-03-09 NOTE — Assessment & Plan Note (Signed)
Chronic, ongoing he reports not taking Benazepril. Recommend to monitor BP at home and document since he took himself off BP medication.  Obtain future labs as ordered in July.  Return in 3 months for follow-up.

## 2019-03-09 NOTE — Patient Instructions (Signed)

## 2019-03-09 NOTE — Assessment & Plan Note (Signed)
Ongoing, stable.  Recheck A1C on outpatient labs and continue focus on diet and exercise.

## 2019-03-09 NOTE — Assessment & Plan Note (Signed)
Chronic, ongoing.  Would benefit from SSRI, such as Zoloft, but refuses this.  Refills sent on Klonopin, refer to anxiety plan. °

## 2019-03-09 NOTE — Assessment & Plan Note (Signed)
Chronic, ongoing.  Has been on benzo long term, recommended trial of Zoloft which he does not wish to try at this time.  Discussed at length risks of long term benzo use.  He is aware of risks.  Refill sent #90 plus 2 refills.  Will have return to office in 3 months for drug screen and contract.

## 2019-03-09 NOTE — Progress Notes (Signed)
Ht 6\' 1"  (1.854 m)   Wt 240 lb (108.9 kg)   BMI 31.66 kg/m    Subjective:    Patient ID: Harold Tate, male    DOB: 1965/06/04, 53 y.o.   MRN: [id number]  HPI: LAD PLANE is a 53 y.o. male  Chief Complaint  Patient presents with  . Anxiety    clonazepam refill    . This visit was completed via Doximity due to the restrictions of the COVID-19 pandemic. All issues as above were discussed and addressed. Physical exam was done as above through visual confirmation on Doximity. If it was felt that the patient should be evaluated in the office, they were directed there. The patient verbally consented to this visit. . Location of the patient: home . Location of the provider: work . Those involved with this call:  . Provider: Marnee Guarneri, DNP . CMA: Yvonna Alanis, CMA . Front Desk/Registration: Jill Side  . Time spent on call: 15 minutes with patient face to face via video conference. More than 50% of this time was spent in counseling and coordination of care. 10 minutes total spent in review of patient's record and preparation of their chart.  . I verified patient identity using two factors (patient name and date of birth). Patient consents verbally to being seen via telemedicine visit today.    ANXIETY/STRESS Continues on Clonazepam 0.5 MG TID ordered by previous PCP.  Has been on this for several years, used to be Xanax but was changed because he was told "Xanax is cream of crop with more side effects".  Pt is aware of risks of benzo medication use to include increased sedation, respiratory suppression, falls, extrapyramidal movements,  dependence and cardiovascular events.  Pt would like to continue treatment as benefit determined to outweigh risk.  Is on Tramadol due to recent hip surgery, reports he is not taking this at same time as Klonopin.  He reports "people always try to push Zoloft and that stuff".  Discussed current research with him.  He reports in past when he  tried to quit taking Xanax, it became worse and then he broke out in hives.  Has tried Wellbutrin and Prozac in past without success.  He does not like "any medication that has crazy side effects", the Wellbutrin caused "weird thoughts".   Duration:stable Anxious mood: no  Excessive worrying: no Irritability: no  Sweating: no Nausea: no Palpitations:no Hyperventilation: no Panic attacks: no Agoraphobia: no  Obscessions/compulsions: no Depressed mood: no Depression screen [facility] 2/9 03/09/2019 05/05/2018 10/23/2016 06/05/2016  Decreased Interest 0 0 3 0  Down, Depressed, Hopeless 0 0 3 0  PHQ - 2 Score 0 0 6 0  Altered sleeping 0 0 3 -  Tired, decreased energy 0 0 3 -  Change in appetite 0 1 1 -  Feeling bad or failure about yourself  0 0 1 -  Trouble concentrating 0 0 1 -  Moving slowly or fidgety/restless 0 0 1 -  Suicidal thoughts 0 0 0 -  PHQ-9 Score 0 1 16 -  Difficult doing work/chores Not difficult at all Not difficult at all - -   Anhedonia: no Weight changes: no Insomnia: none Hypersomnia: no Fatigue/loss of energy: no Feelings of worthlessness: no Feelings of guilt: no Impaired concentration/indecisiveness: no Suicidal ideations: no  Crying spells: no Recent Stressors/Life Changes: no   Relationship problems: no   Family stress: no     Financial stress: no    Job stress: no  Recent death/loss: no GAD 7 : Generalized Anxiety Score 03/09/2019 05/05/2018 11/02/2015  Nervous, Anxious, on Edge 3 3 3   Control/stop worrying 3 2 3   Worry too much - different things 3 2 3   Trouble relaxing 2 2 3   Restless - 2 0  Easily annoyed or irritable 2 0 2  Afraid - awful might happen 2 2 0  Total GAD 7 Score - 13 14  Anxiety Difficulty Not difficult at all Somewhat difficult Somewhat difficult    DIABETES No current medications and last A1C February 2020 was 5.8%.  Missed labs in July.  Had tried Metformin in past, but was concerned about side effects and focused on diet and  improved A1C on his own with diet, so was told not to continue Metformin. Hypoglycemic episodes:no Polydipsia/polyuria: no Visual disturbance: no Chest pain: no Paresthesias: no Glucose Monitoring: no  Accucheck frequency: Not Checking  Fasting glucose:  Post prandial:  Evening:  Before meals: Taking Insulin?: no  Long acting insulin:  Short acting insulin: Blood Pressure Monitoring: not checking Retinal Examination: Not up to Date Foot Exam: Not up to Date Pneumovax: Not up to Date Influenza: Not up to Date Aspirin: no   HYPERTENSION / HYPERLIPIDEMIA Quit taking Benazepril 40 MG daily (weaned himself off this) and is not taking statin, refuses this.  He reports physical therapy says "BP looks good" and he does not like taking medication because of concerns for side effects. Satisfied with current treatment? yes Duration of hypertension: chronic BP monitoring frequency: not checking BP range:  BP medication side effects: no Duration of hyperlipidemia: chronic Cholesterol medication side effects: no Cholesterol supplements: none Medication compliance: good compliance Aspirin: no Recent stressors: no Recurrent headaches: no Visual changes: no Palpitations: no Dyspnea: no Chest pain: no Lower extremity edema: no Dizzy/lightheaded: no  The 10-year ASCVD risk score Mikey Bussing DC Jr., et al., 2013) is: 30.7%   Values used to calculate the score:     Age: 50 years     Sex: Male     Is Non-Hispanic African American: No     Diabetic: Yes     Tobacco smoker: Yes     Systolic Blood Pressure: 123456 mmHg     Is BP treated: No     HDL Cholesterol: 48 mg/dL     Total Cholesterol: 269 mg/dL  Relevant past medical, surgical, family and social history reviewed and updated as indicated. Interim medical history since our last visit reviewed. Allergies and medications reviewed and updated.  Review of Systems  Constitutional: Negative for activity change, diaphoresis, fatigue and fever.   Respiratory: Negative for cough, chest tightness, shortness of breath and wheezing.   Cardiovascular: Negative for chest pain, palpitations and leg swelling.  Gastrointestinal: Negative for abdominal distention, abdominal pain, constipation, diarrhea, nausea and vomiting.  Endocrine: Negative for cold intolerance, heat intolerance, polydipsia, polyphagia and polyuria.  Neurological: Negative for dizziness, syncope, weakness, light-headedness, numbness and headaches.  Psychiatric/Behavioral: Negative for decreased concentration, self-injury, sleep disturbance and suicidal ideas. The patient is nervous/anxious.     Per HPI unless specifically indicated above     Objective:    Ht 6\' 1"  (1.854 m)   Wt 240 lb (108.9 kg)   BMI 31.66 kg/m   Wt Readings from Last 3 Encounters:  03/09/19 240 lb (108.9 kg)  02/11/19 238 lb 8 oz (108.2 kg)  02/04/19 236 lb (107 kg)    Physical Exam Vitals signs and nursing note reviewed.  Constitutional:  General: He is awake. He is not in acute distress.    Appearance: He is well-developed. He is not ill-appearing.  HENT:     Head: Normocephalic.     Right Ear: Hearing normal. No drainage.     Left Ear: Hearing normal. No drainage.  Eyes:     General: Lids are normal.        Right eye: No discharge.        Left eye: No discharge.     Conjunctiva/sclera: Conjunctivae normal.  Neck:     Musculoskeletal: Normal range of motion.  Pulmonary:     Effort: Pulmonary effort is normal. No accessory muscle usage or respiratory distress.  Neurological:     Mental Status: He is alert and oriented to person, place, and time.  Psychiatric:        Mood and Affect: Mood normal.        Behavior: Behavior normal. Behavior is cooperative.        Thought Content: Thought content normal.        Judgment: Judgment normal.     Results for orders placed or performed during the hospital encounter of 02/11/19  Glucose, capillary  Result Value Ref Range    Glucose-Capillary 174 (H) 70 - 99 mg/dL  Glucose, capillary  Result Value Ref Range   Glucose-Capillary 142 (H) 70 - 99 mg/dL  CBC  Result Value Ref Range   WBC 11.9 (H) 4.0 - 10.5 K/uL   RBC 4.52 4.22 - 5.81 MIL/uL   Hemoglobin 15.6 13.0 - 17.0 g/dL   HCT 44.9 39.0 - 52.0 %   MCV 99.3 80.0 - 100.0 fL   MCH 34.5 (H) 26.0 - 34.0 pg   MCHC 34.7 30.0 - 36.0 g/dL   RDW 13.6 11.5 - 15.5 %   Platelets 169 150 - 400 K/uL   nRBC 0.0 0.0 - 0.2 %  Creatinine, serum  Result Value Ref Range   Creatinine, Ser 0.71 0.61 - 1.24 mg/dL   GFR calc non Af Amer >60 >60 mL/min   GFR calc Af Amer >60 >60 mL/min  CBC  Result Value Ref Range   WBC 5.1 4.0 - 10.5 K/uL   RBC 4.33 4.22 - 5.81 MIL/uL   Hemoglobin 14.8 13.0 - 17.0 g/dL   HCT 42.4 39.0 - 52.0 %   MCV 97.9 80.0 - 100.0 fL   MCH 34.2 (H) 26.0 - 34.0 pg   MCHC 34.9 30.0 - 36.0 g/dL   RDW 13.0 11.5 - 15.5 %   Platelets 139 (L) 150 - 400 K/uL   nRBC 0.0 0.0 - 0.2 %  Basic metabolic panel  Result Value Ref Range   Sodium 133 (L) 135 - 145 mmol/L   Potassium 4.1 3.5 - 5.1 mmol/L   Chloride 97 (L) 98 - 111 mmol/L   CO2 28 22 - 32 mmol/L   Glucose, Bld 136 (H) 70 - 99 mg/dL   BUN 7 6 - 20 mg/dL   Creatinine, Ser 0.74 0.61 - 1.24 mg/dL   Calcium 8.4 (L) 8.9 - 10.3 mg/dL   GFR calc non Af Amer >60 >60 mL/min   GFR calc Af Amer >60 >60 mL/min   Anion gap 8 5 - 15  Surgical pathology  Result Value Ref Range   SURGICAL PATHOLOGY      SURGICAL PATHOLOGY CASE: [id number] PATIENT: Alyas Earleen Newport Surgical Pathology Report     Specimen Submitted: A. Femoral head, left  Clinical History: Primary osteoarthritis of left hip.  Avascular necrosis left hip      DIAGNOSIS: A. BONE, LEFT FEMORAL HEAD; ARTHROPLASTY: - FINDINGS CONSISTENT WITH DEGENERATIVE OSTEOARTHROPATHY. - BONE MARROW NECROSIS CONSISTENT WITH AVASCULAR NECROSIS  GROSS DESCRIPTION: A. Labeled: Left femoral head Received: In formalin Size of specimen:      Head  -5.5 x 5.0 x 4.0 cm      Neck -4.0 x 3.0 x 2.0 cm      Additional tissue: Not present Articular surface: Partially distorted articular surface shows multiple eroded areas (approximately 75% of the surface) Cut surface: Multifocal subcortical necrosis which is located 2.5 cm away from surgical margin Other findings: Not identified  Block summary: 1-representative sections after decalcification   Final Diagnosis performed by Raynelle Bring, MD.   Electronically signed  02/12/2019 2:36:05PM The electronic signature indicates that the named Attending Pathologist has evaluated the specimen Technical component performed at [facility], [address] [place], [place] [address] Lab: [phone] Dir: Rush Farmer, MD, MMM  Professional component performed at [facility], [facility], [place], [place], [place] [address] Lab: [phone] Dir: Dellia Nims. Rubinas, MD       Assessment & Plan:   Problem List Items Addressed This Visit      Cardiovascular and Mediastinum   Benign hypertension (Chronic)    Chronic, ongoing he reports not taking Benazepril. Recommend to monitor BP at home and document since he took himself off BP medication.  Obtain future labs as ordered in July.  Return in 3 months for follow-up.        Endocrine   Diabetes mellitus associated with hormonal etiology ([place])    Ongoing, stable.  Recheck A1C on outpatient labs and continue focus on diet and exercise.        Other   Hypercholesterolemia    Chronic, ongoing.  Refuses statin, will continue to reiterate the importance of statin based on ASCVD score.  Recommend statin use to reduce risks.      Anxiety    Chronic, ongoing.  Has been on benzo long term, recommended trial of Zoloft which he does not wish to try at this time.  Discussed at length risks of long term benzo use.  He is aware of risks.  Refill sent #90 plus 2 refills.  Will have return to office in 3 months for drug screen and  contract.      Relevant Medications   clonazePAM (KLONOPIN) 0.5 MG tablet   Depression, major, single episode, moderate (HCC)    Chronic, ongoing.  Would benefit from SSRI, such as Zoloft, but refuses this.  Refills sent on Klonopin, refer to anxiety plan.         I discussed the assessment and treatment plan with the patient. The patient was provided an opportunity to ask questions and all were answered. The patient agreed with the plan and demonstrated an understanding of the instructions.   The patient was advised to call back or seek an in-person evaluation if the symptoms worsen or if the condition fails to improve as anticipated.   I provided 15 minutes of time during this encounter.  Follow up plan: Return in about 3 months (around 06/09/2019) for Anxiety, HTN/HLD, T2DM.

## 2019-06-10 ENCOUNTER — Encounter: Payer: Self-pay | Admitting: Nurse Practitioner

## 2019-06-10 ENCOUNTER — Ambulatory Visit (INDEPENDENT_AMBULATORY_CARE_PROVIDER_SITE_OTHER): Payer: Self-pay | Admitting: Nurse Practitioner

## 2019-06-10 DIAGNOSIS — E1159 Type 2 diabetes mellitus with other circulatory complications: Secondary | ICD-10-CM

## 2019-06-10 DIAGNOSIS — I152 Hypertension secondary to endocrine disorders: Secondary | ICD-10-CM

## 2019-06-10 DIAGNOSIS — I10 Essential (primary) hypertension: Secondary | ICD-10-CM

## 2019-06-10 DIAGNOSIS — Z79899 Other long term (current) drug therapy: Secondary | ICD-10-CM | POA: Insufficient documentation

## 2019-06-10 DIAGNOSIS — F411 Generalized anxiety disorder: Secondary | ICD-10-CM

## 2019-06-10 DIAGNOSIS — E785 Hyperlipidemia, unspecified: Secondary | ICD-10-CM

## 2019-06-10 DIAGNOSIS — E1169 Type 2 diabetes mellitus with other specified complication: Secondary | ICD-10-CM

## 2019-06-10 DIAGNOSIS — F321 Major depressive disorder, single episode, moderate: Secondary | ICD-10-CM

## 2019-06-10 MED ORDER — CLONAZEPAM 0.5 MG PO TABS: ORAL_TABLET | ORAL | 3 refills | Status: DC

## 2019-06-10 NOTE — Assessment & Plan Note (Signed)
Ongoing, stable.  Recheck A1C on outpatient labs and continue focus on diet and exercise.  Initiate medication as needed.

## 2019-06-10 NOTE — Assessment & Plan Note (Signed)
Chronic, ongoing.  Has been on benzo long term, recommended trial SSRI which he refuses at this time.  Discussed at length risks of long term benzo use.  He is aware of risks.  Refill sent #90 plus 2 refills.  Will obtain outpatient drug screen and obtain contract next visit.

## 2019-06-10 NOTE — Patient Instructions (Signed)

## 2019-06-10 NOTE — Progress Notes (Signed)
There were no vitals taken for this visit.   Subjective:    Patient ID: Harold Tate, male    DOB: 08/31/1965, 54 y.o.   MRN: [id number]  HPI: Harold Tate is a 54 y.o. male  Chief Complaint  Patient presents with  . Anxiety    . This visit was completed via MyChart due to the restrictions of the COVID-19 pandemic. All issues as above were discussed and addressed. Physical exam was done as above through visual confirmation on MyChart. If it was felt that the patient should be evaluated in the office, they were directed there. The patient verbally consented to this visit. . Location of the patient: home . Location of the provider: work . Those involved with this call:  . Provider: Marnee Guarneri, DNP . CMA: Yvonna Alanis, CMA . Front Desk/Registration: Don Perking  . Time spent on call: 15 minutes with patient face to face via video conference. More than 50% of this time was spent in counseling and coordination of care. 10 minutes total spent in review of patient's record and preparation of their chart.  . I verified patient identity using two factors (patient name and date of birth). Patient consents verbally to being seen via telemedicine visit today.    DIABETES No current medications and last A1C February 2020 was 5.8%.  Went in for EKG because he was not feeling right and was told EKG was good and that he had T2DM, was put on Metformin.  He researched this and changed his diet, previous PCP told him A1C was good and was "two stages under prediabetic" without medication.  Was taken Metformin.   Hypoglycemic episodes:no Polydipsia/polyuria: no Visual disturbance: no Chest pain: no Paresthesias: no Glucose Monitoring: no             Accucheck frequency: Not Checking             Fasting glucose:             Post prandial:             Evening:             Before meals: Taking Insulin?: no             Long acting insulin:             Short acting insulin: Blood  Pressure Monitoring: not checking Retinal Examination: Not up to Date Foot Exam: Not up to Date Pneumovax: Not up to Date Influenza: Not up to Date Aspirin: no   HYPERTENSION / HYPERLIPIDEMIA No current medications, he quit taking these months back and focused on diet. Satisfied with current treatment? yes Duration of hypertension: chronic BP monitoring frequency: not checking BP range:  BP medication side effects: no Duration of hyperlipidemia: chronic Cholesterol medication side effects: no Cholesterol supplements: none Medication compliance: good compliance Aspirin: no Recent stressors: no Recurrent headaches: no Visual changes: no Palpitations: no Dyspnea: no Chest pain: no Lower extremity edema: no Dizzy/lightheaded: no   ANXIETY/STRESS Continues on Clonazepam 0.5 MG TID ordered by previous PCP.  Has been on this for several years, used to be Xanax but was changed. Pt is aware of risks of benzo medication use to include increased sedation, respiratory suppression, falls, extrapyramidal movements,  dependence and cardiovascular events.  Pt would like to continue treatment as benefit determined to outweigh risk. Discussed current research with him.  He reports in past when he tried to quit taking Xanax, it became worse and then  he broke out in hives.  Has tried Wellbutrin and Prozac in past without success. Wellbutrin caused weird thoughts.  On review of PMP his last Klonopin fill was 06/03/2019, no other recent controlled substance fills.  Last other fill was Tramadol for an acute pain episode on 03/22/19. Duration:stable Anxious mood: no  Excessive worrying: no Irritability: no  Sweating: no Nausea: no Palpitations:no Hyperventilation: no Panic attacks: no Agoraphobia: no  Obscessions/compulsions: no Depressed mood: no Depression screen [facility] 2/9 06/10/2019 03/09/2019 05/05/2018 10/23/2016 06/05/2016  Decreased Interest 0 0 0 3 0  Down, Depressed, Hopeless 0 0 0 3 0  PHQ - 2  Score 0 0 0 6 0  Altered sleeping 0 0 0 3 -  Tired, decreased energy 0 0 0 3 -  Change in appetite 0 0 1 1 -  Feeling bad or failure about yourself  0 0 0 1 -  Trouble concentrating 0 0 0 1 -  Moving slowly or fidgety/restless 0 0 0 1 -  Suicidal thoughts 0 0 0 0 -  PHQ-9 Score 0 0 1 16 -  Difficult doing work/chores Not difficult at all Not difficult at all Not difficult at all - -   Anhedonia: no Weight changes: no Insomnia: none Hypersomnia: no Fatigue/loss of energy: no Feelings of worthlessness: no Feelings of guilt: no Impaired concentration/indecisiveness: no Suicidal ideations: no  Crying spells: no Recent Stressors/Life Changes: no   Relationship problems: no   Family stress: no     Financial stress: no    Job stress: no    Recent death/loss: no GAD 7 : Generalized Anxiety Score 06/10/2019 03/09/2019 05/05/2018 11/02/2015  Nervous, Anxious, on Edge 1 3 3 3   Control/stop worrying 1 3 2 3   Worry too much - different things 1 3 2 3   Trouble relaxing 0 2 2 3   Restless 0 - 2 0  Easily annoyed or irritable 0 2 0 2  Afraid - awful might happen 0 2 2 0  Total GAD 7 Score 3 - 13 14  Anxiety Difficulty Not difficult at all Not difficult at all Somewhat difficult Somewhat difficult     Relevant past medical, surgical, family and social history reviewed and updated as indicated. Interim medical history since our last visit reviewed. Allergies and medications reviewed and updated.  Review of Systems  Constitutional: Negative for activity change, diaphoresis, fatigue and fever.  Respiratory: Negative for cough, chest tightness, shortness of breath and wheezing.   Cardiovascular: Negative for chest pain, palpitations and leg swelling.  Gastrointestinal: Negative.   Endocrine: Negative for polydipsia, polyphagia and polyuria.  Neurological: Negative.   Psychiatric/Behavioral: Negative for decreased concentration, self-injury, sleep disturbance and suicidal ideas. The patient is  not nervous/anxious.     Per HPI unless specifically indicated above     Objective:    There were no vitals taken for this visit.  Wt Readings from Last 3 Encounters:  03/09/19 240 lb (108.9 kg)  02/11/19 238 lb 8 oz (108.2 kg)  02/04/19 236 lb (107 kg)    Physical Exam Vitals and nursing note reviewed.  Constitutional:      General: He is awake. He is not in acute distress.    Appearance: He is well-developed. He is not ill-appearing.  HENT:     Head: Normocephalic.     Right Ear: Hearing normal. No drainage.     Left Ear: Hearing normal. No drainage.  Eyes:     General: Lids are normal.  Right eye: No discharge.        Left eye: No discharge.     Conjunctiva/sclera: Conjunctivae normal.  Pulmonary:     Effort: Pulmonary effort is normal. No accessory muscle usage or respiratory distress.  Musculoskeletal:     Cervical back: Normal range of motion.  Neurological:     Mental Status: He is alert and oriented to person, place, and time.  Psychiatric:        Mood and Affect: Mood normal.        Behavior: Behavior normal. Behavior is cooperative.        Thought Content: Thought content normal.        Judgment: Judgment normal.     Results for orders placed or performed during the hospital encounter of 02/11/19  Glucose, capillary  Result Value Ref Range   Glucose-Capillary 174 (H) 70 - 99 mg/dL  Glucose, capillary  Result Value Ref Range   Glucose-Capillary 142 (H) 70 - 99 mg/dL  CBC  Result Value Ref Range   WBC 11.9 (H) 4.0 - 10.5 K/uL   RBC 4.52 4.22 - 5.81 MIL/uL   Hemoglobin 15.6 13.0 - 17.0 g/dL   HCT 44.9 39.0 - 52.0 %   MCV 99.3 80.0 - 100.0 fL   MCH 34.5 (H) 26.0 - 34.0 pg   MCHC 34.7 30.0 - 36.0 g/dL   RDW 13.6 11.5 - 15.5 %   Platelets 169 150 - 400 K/uL   nRBC 0.0 0.0 - 0.2 %  Creatinine, serum  Result Value Ref Range   Creatinine, Ser 0.71 0.61 - 1.24 mg/dL   GFR calc non Af Amer >60 >60 mL/min   GFR calc Af Amer >60 >60 mL/min  CBC   Result Value Ref Range   WBC 5.1 4.0 - 10.5 K/uL   RBC 4.33 4.22 - 5.81 MIL/uL   Hemoglobin 14.8 13.0 - 17.0 g/dL   HCT 42.4 39.0 - 52.0 %   MCV 97.9 80.0 - 100.0 fL   MCH 34.2 (H) 26.0 - 34.0 pg   MCHC 34.9 30.0 - 36.0 g/dL   RDW 13.0 11.5 - 15.5 %   Platelets 139 (L) 150 - 400 K/uL   nRBC 0.0 0.0 - 0.2 %  Basic metabolic panel  Result Value Ref Range   Sodium 133 (L) 135 - 145 mmol/L   Potassium 4.1 3.5 - 5.1 mmol/L   Chloride 97 (L) 98 - 111 mmol/L   CO2 28 22 - 32 mmol/L   Glucose, Bld 136 (H) 70 - 99 mg/dL   BUN 7 6 - 20 mg/dL   Creatinine, Ser 0.74 0.61 - 1.24 mg/dL   Calcium 8.4 (L) 8.9 - 10.3 mg/dL   GFR calc non Af Amer >60 >60 mL/min   GFR calc Af Amer >60 >60 mL/min   Anion gap 8 5 - 15  Surgical pathology  Result Value Ref Range   SURGICAL PATHOLOGY      SURGICAL PATHOLOGY CASE: [id number] PATIENT: Adit Earleen Newport Surgical Pathology Report     Specimen Submitted: A. Femoral head, left  Clinical History: Primary osteoarthritis of left hip.  Avascular necrosis left hip      DIAGNOSIS: A. BONE, LEFT FEMORAL HEAD; ARTHROPLASTY: - FINDINGS CONSISTENT WITH DEGENERATIVE OSTEOARTHROPATHY. - BONE MARROW NECROSIS CONSISTENT WITH AVASCULAR NECROSIS  GROSS DESCRIPTION: A. Labeled: Left femoral head Received: In formalin Size of specimen:      Head -5.5 x 5.0 x 4.0 cm      Neck -4.0 x  3.0 x 2.0 cm      Additional tissue: Not present Articular surface: Partially distorted articular surface shows multiple eroded areas (approximately 75% of the surface) Cut surface: Multifocal subcortical necrosis which is located 2.5 cm away from surgical margin Other findings: Not identified  Block summary: 1-representative sections after decalcification   Final Diagnosis performed by Raynelle Bring, MD.   Electronically signed  02/12/2019 2:36:05PM The electronic signature indicates that the named Attending Pathologist has evaluated the specimen Technical  component performed at [facility], [address] [place], [place] [address] Lab: [phone] Dir: Rush Farmer, MD, MMM  Professional component performed at [facility], [facility], [place], [place], [place] [address] Lab: [phone] Dir: Dellia Nims. Rubinas, MD       Assessment & Plan:   Problem List Items Addressed This Visit      Cardiovascular and Mediastinum   Hypertension associated with diabetes ([place])    Chronic, ongoing he has stopped taking medications. Recommend to monitor BP at home and document since he took himself off BP medication.  Obtain future labs as ordered. Return in 3 months for follow-up.      Relevant Orders   Comprehensive metabolic panel   Bayer DCA Hb A1c Waived   Microalbumin, Urine Waived     Endocrine   Hyperlipidemia associated with type 2 diabetes mellitus (HCC)    Chronic, ongoing.  Refuses statin, will continue to reiterate the importance of statin based on ASCVD score.  Recommend statin use to reduce risks.      Relevant Orders   Comprehensive metabolic panel   Bayer DCA Hb A1c Waived   Lipid Panel [place], Waived   Diabetes mellitus associated with hormonal etiology ([place]) - Primary    Ongoing, stable.  Recheck A1C on outpatient labs and continue focus on diet and exercise.  Initiate medication as needed.        Relevant Orders   Comprehensive metabolic panel   Bayer DCA Hb A1c Waived   Microalbumin, Urine Waived     Other   Generalized anxiety disorder (Chronic)    Chronic, ongoing.  Has been on benzo long term, recommended trial of SSRI which he does not wish to try at this time.  Discussed at length risks of long term benzo use.  He is aware of risks.  Refill sent #90 plus 2 refills.  Will have return to office in 3 months.  Obtain outpatient drug screen.      Relevant Medications   clonazePAM (KLONOPIN) 0.5 MG tablet (Start on 07/01/2019)   Other Relevant Orders   Urine drugs of abuse scrn w alc, routine (Ref  Lab)   Depression, major, single episode, moderate (HCC)    Chronic, ongoing.  Would benefit from SSRI, such as Zoloft, but refuses this.  Refills sent on Klonopin, refer to anxiety plan.      Long-term current use of benzodiazepine    Chronic, ongoing.  Has been on benzo long term, recommended trial SSRI which he refuses at this time.  Discussed at length risks of long term benzo use.  He is aware of risks.  Refill sent #90 plus 2 refills.  Will obtain outpatient drug screen and obtain contract next visit.      Relevant Orders   Urine drugs of abuse scrn w alc, routine (Ref Lab)       Follow up plan: Return in about 3 months (around 09/07/2019) for T2DM, HTN/HLD, Anxiety.

## 2019-06-10 NOTE — Assessment & Plan Note (Signed)
Chronic, ongoing.  Would benefit from SSRI, such as Zoloft, but refuses this.  Refills sent on Klonopin, refer to anxiety plan. °

## 2019-06-10 NOTE — Assessment & Plan Note (Signed)
Chronic, ongoing he has stopped taking medications. Recommend to monitor BP at home and document since he took himself off BP medication.  Obtain future labs as ordered. Return in 3 months for follow-up.

## 2019-06-10 NOTE — Assessment & Plan Note (Signed)
Chronic, ongoing.  Refuses statin, will continue to reiterate the importance of statin based on ASCVD score.  Recommend statin use to reduce risks.

## 2019-06-10 NOTE — Assessment & Plan Note (Signed)
Chronic, ongoing.  Has been on benzo long term, recommended trial of SSRI which he does not wish to try at this time.  Discussed at length risks of long term benzo use.  He is aware of risks.  Refill sent #90 plus 2 refills.  Will have return to office in 3 months.  Obtain outpatient drug screen.

## 2019-06-29 ENCOUNTER — Other Ambulatory Visit: Payer: Self-pay

## 2019-08-27 ENCOUNTER — Telehealth: Payer: Self-pay | Admitting: Family Medicine

## 2019-08-27 ENCOUNTER — Ambulatory Visit (INDEPENDENT_AMBULATORY_CARE_PROVIDER_SITE_OTHER): Payer: 59 | Admitting: Nurse Practitioner

## 2019-08-27 ENCOUNTER — Other Ambulatory Visit: Payer: Self-pay

## 2019-08-27 ENCOUNTER — Encounter: Payer: Self-pay | Admitting: Nurse Practitioner

## 2019-08-27 VITALS — BP 138/86 | HR 89 | Temp 97.7°F | Wt 243.4 lb

## 2019-08-27 DIAGNOSIS — F411 Generalized anxiety disorder: Secondary | ICD-10-CM

## 2019-08-27 DIAGNOSIS — Z8489 Family history of other specified conditions: Secondary | ICD-10-CM | POA: Diagnosis not present

## 2019-08-27 DIAGNOSIS — E1169 Type 2 diabetes mellitus with other specified complication: Secondary | ICD-10-CM | POA: Diagnosis not present

## 2019-08-27 DIAGNOSIS — E1159 Type 2 diabetes mellitus with other circulatory complications: Secondary | ICD-10-CM | POA: Diagnosis not present

## 2019-08-27 DIAGNOSIS — E785 Hyperlipidemia, unspecified: Secondary | ICD-10-CM

## 2019-08-27 DIAGNOSIS — I152 Hypertension secondary to endocrine disorders: Secondary | ICD-10-CM

## 2019-08-27 DIAGNOSIS — Z79899 Other long term (current) drug therapy: Secondary | ICD-10-CM

## 2019-08-27 DIAGNOSIS — I10 Essential (primary) hypertension: Secondary | ICD-10-CM

## 2019-08-27 LAB — BAYER DCA HB A1C WAIVED: HB A1C (BAYER DCA - WAIVED): 7.8 % — ABNORMAL HIGH (ref <7.0)

## 2019-08-27 LAB — LIPID PANEL PICCOLO, WAIVED
Chol/HDL Ratio Piccolo,Waive: 3.6 mg/dL
Cholesterol Piccolo, Waived: 218 mg/dL — ABNORMAL HIGH (ref <200)
HDL Chol Piccolo, Waived: 60 mg/dL (ref >59)
LDL Chol Calc Piccolo Waived: 129 mg/dL — ABNORMAL HIGH (ref <100)
Triglycerides Piccolo,Waived: 149 mg/dL (ref <150)
VLDL Chol Calc Piccolo,Waive: 30 mg/dL — ABNORMAL HIGH (ref <30)

## 2019-08-27 LAB — MICROALBUMIN, URINE WAIVED
Creatinine, Urine Waived: 200 mg/dL (ref 10–300)
Microalb, Ur Waived: 80 mg/L — ABNORMAL HIGH (ref 0–19)

## 2019-08-27 MED ORDER — CLONAZEPAM 0.5 MG PO TABS: ORAL_TABLET | ORAL | 3 refills | Status: DC

## 2019-08-27 MED ORDER — BACLOFEN 10 MG PO TABS: 10.0000 mg | ORAL_TABLET | ORAL | 2 refills | Status: DC | PRN

## 2019-08-27 NOTE — Telephone Encounter (Signed)
Pt states he has no refills of clonazepam. Please advise.

## 2019-08-27 NOTE — Progress Notes (Signed)
BP 138/86   Pulse 89   Temp 97.7 F (36.5 C) (Oral)   Wt 243 lb 6.4 oz (110.4 kg)   SpO2 96%   BMI 32.11 kg/m    Subjective:    Patient ID: Harold Tate, male    DOB: 01-18-1966, 54 y.o.   MRN: [id number]  HPI: Harold Tate is a 54 y.o. male  Chief Complaint  Patient presents with  . Follow-up    pt states he thinks he may have some kind of autoiummune issue, states he was told by another doctor that it may be something with his muscles    AUTOIMMUNE DISEASE: Had sister on speaker phone who is being seen at [facility] for hereditary spastic paraplegia.  She reports her brother was seen at [facility] and obtained a hip replacement -- but never assessed for this and now insurance does not cover [facility].  Doctors at [facility] told her since she has this her brother (patient) most likely has same thing, he has had similar symptoms since 2014.  She sees Pharmacist, hospital Dr. Queen Slough.  His sister would like referral here and she reports that he needs a refill on Baclofen, which was helpful in past for patient with his gait and muscle spasms.  Denies any CP, SOB, fatigue, or recent falls.  He did not obtain outpatient labs as requested at his February 2021 visit -- will obtain these today.  Relevant past medical, surgical, family and social history reviewed and updated as indicated. Interim medical history since our last visit reviewed. Allergies and medications reviewed and updated.  Review of Systems  Constitutional: Negative for activity change, diaphoresis, fatigue and fever.  Respiratory: Negative for cough, chest tightness, shortness of breath and wheezing.   Cardiovascular: Negative for chest pain, palpitations and leg swelling.  Gastrointestinal: Negative.   Endocrine: Negative for polydipsia, polyphagia and polyuria.  Neurological: Negative.   Psychiatric/Behavioral: Negative for decreased concentration, self-injury, sleep disturbance and suicidal ideas. The patient is not  nervous/anxious.     Per HPI unless specifically indicated above     Objective:    BP 138/86   Pulse 89   Temp 97.7 F (36.5 C) (Oral)   Wt 243 lb 6.4 oz (110.4 kg)   SpO2 96%   BMI 32.11 kg/m   Wt Readings from Last 3 Encounters:  08/27/19 243 lb 6.4 oz (110.4 kg)  03/09/19 240 lb (108.9 kg)  02/11/19 238 lb 8 oz (108.2 kg)    Physical Exam Vitals and nursing note reviewed.  Constitutional:      General: He is awake. He is not in acute distress.    Appearance: He is well-developed. He is obese. He is not ill-appearing.  HENT:     Head: Normocephalic and atraumatic.     Right Ear: Hearing normal. No drainage.     Left Ear: Hearing normal. No drainage.  Eyes:     General: Lids are normal.        Right eye: No discharge.        Left eye: No discharge.     Conjunctiva/sclera: Conjunctivae normal.     Pupils: Pupils are equal, round, and reactive to light.  Neck:     Thyroid: No thyromegaly.     Vascular: No carotid bruit.  Cardiovascular:     Rate and Rhythm: Normal rate and regular rhythm.     Heart sounds: Normal heart sounds, S1 normal and S2 normal. No murmur. No gallop.   Pulmonary:  Effort: Pulmonary effort is normal. No accessory muscle usage or respiratory distress.     Breath sounds: Normal breath sounds.  Abdominal:     General: Bowel sounds are normal.     Palpations: Abdomen is soft.  Musculoskeletal:        General: Normal range of motion.     Cervical back: Normal range of motion and neck supple.     Right lower leg: No edema.     Left lower leg: No edema.  Skin:    General: Skin is warm and dry.     Capillary Refill: Capillary refill takes less than 2 seconds.     Findings: No rash.  Neurological:     Mental Status: He is alert and oriented to person, place, and time.     Deep Tendon Reflexes: Reflexes are normal and symmetric.  Psychiatric:        Attention and Perception: Attention normal.        Mood and Affect: Mood normal.         Speech: Speech normal.        Behavior: Behavior normal. Behavior is cooperative.        Thought Content: Thought content normal.    Results for orders placed or performed during the hospital encounter of 02/11/19  Glucose, capillary  Result Value Ref Range   Glucose-Capillary 174 (H) 70 - 99 mg/dL  Glucose, capillary  Result Value Ref Range   Glucose-Capillary 142 (H) 70 - 99 mg/dL  CBC  Result Value Ref Range   WBC 11.9 (H) 4.0 - 10.5 K/uL   RBC 4.52 4.22 - 5.81 MIL/uL   Hemoglobin 15.6 13.0 - 17.0 g/dL   HCT 44.9 39.0 - 52.0 %   MCV 99.3 80.0 - 100.0 fL   MCH 34.5 (H) 26.0 - 34.0 pg   MCHC 34.7 30.0 - 36.0 g/dL   RDW 13.6 11.5 - 15.5 %   Platelets 169 150 - 400 K/uL   nRBC 0.0 0.0 - 0.2 %  Creatinine, serum  Result Value Ref Range   Creatinine, Ser 0.71 0.61 - 1.24 mg/dL   GFR calc non Af Amer >60 >60 mL/min   GFR calc Af Amer >60 >60 mL/min  CBC  Result Value Ref Range   WBC 5.1 4.0 - 10.5 K/uL   RBC 4.33 4.22 - 5.81 MIL/uL   Hemoglobin 14.8 13.0 - 17.0 g/dL   HCT 42.4 39.0 - 52.0 %   MCV 97.9 80.0 - 100.0 fL   MCH 34.2 (H) 26.0 - 34.0 pg   MCHC 34.9 30.0 - 36.0 g/dL   RDW 13.0 11.5 - 15.5 %   Platelets 139 (L) 150 - 400 K/uL   nRBC 0.0 0.0 - 0.2 %  Basic metabolic panel  Result Value Ref Range   Sodium 133 (L) 135 - 145 mmol/L   Potassium 4.1 3.5 - 5.1 mmol/L   Chloride 97 (L) 98 - 111 mmol/L   CO2 28 22 - 32 mmol/L   Glucose, Bld 136 (H) 70 - 99 mg/dL   BUN 7 6 - 20 mg/dL   Creatinine, Ser 0.74 0.61 - 1.24 mg/dL   Calcium 8.4 (L) 8.9 - 10.3 mg/dL   GFR calc non Af Amer >60 >60 mL/min   GFR calc Af Amer >60 >60 mL/min   Anion gap 8 5 - 15  Surgical pathology  Result Value Ref Range   SURGICAL PATHOLOGY      SURGICAL PATHOLOGY CASE: [id number] PATIENT: Corbyn  Sinn Surgical Pathology Report     Specimen Submitted: A. Femoral head, left  Clinical History: Primary osteoarthritis of left hip.  Avascular necrosis left  hip      DIAGNOSIS: A. BONE, LEFT FEMORAL HEAD; ARTHROPLASTY: - FINDINGS CONSISTENT WITH DEGENERATIVE OSTEOARTHROPATHY. - BONE MARROW NECROSIS CONSISTENT WITH AVASCULAR NECROSIS  GROSS DESCRIPTION: A. Labeled: Left femoral head Received: In formalin Size of specimen:      Head -5.5 x 5.0 x 4.0 cm      Neck -4.0 x 3.0 x 2.0 cm      Additional tissue: Not present Articular surface: Partially distorted articular surface shows multiple eroded areas (approximately 75% of the surface) Cut surface: Multifocal subcortical necrosis which is located 2.5 cm away from surgical margin Other findings: Not identified  Block summary: 1-representative sections after decalcification   Final Diagnosis performed by Raynelle Bring, MD.   Electronically signed  02/12/2019 2:36:05PM The electronic signature indicates that the named Attending Pathologist has evaluated the specimen Technical component performed at [facility], [address] [place], [place] [address] Lab: [phone] Dir: Rush Farmer, MD, MMM  Professional component performed at [facility], [facility], [place], [place], [place] [address] Lab: [phone] Dir: Dellia Nims. Rubinas, MD       Assessment & Plan:   Problem List Items Addressed This Visit      Cardiovascular and Mediastinum   Hypertension associated with diabetes ([place])     Endocrine   Hyperlipidemia associated with type 2 diabetes mellitus ([place])   Diabetes mellitus associated with hormonal etiology ([place])     Other   Generalized anxiety disorder (Chronic)   Long-term current use of benzodiazepine    Other Visit Diagnoses    Family history of genetic disorder    -  Primary   Referral placed to [facility] and script for Baclofen sent.  Will review [facility] notes once available.     Relevant Orders   Ambulatory referral to Neurology       Follow up plan: Return in about 3 months (around 11/26/2019) for Anxiety and Diabetes.

## 2019-08-27 NOTE — Addendum Note (Signed)
Addended by: Marnee Guarneri T on: 08/27/2019 11:45 AM   Modules accepted: Orders

## 2019-08-27 NOTE — Patient Instructions (Signed)
Muscle Cramps and Spasms Muscle cramps and spasms are when muscles tighten by themselves. They usually get better within minutes. Muscle cramps are painful. They are usually stronger and last longer than muscle spasms. Muscle spasms may or may not be painful. They can last a few seconds or much longer. Cramps and spasms can affect any muscle, but they occur most often in the calf muscles of the leg. They are usually not caused by a serious problem. In many cases, the cause is not known. Some common causes include:  Doing more physical work or exercise than your body is ready for.  Using the muscles too much (overuse) by repeating certain movements too many times.  Staying in a certain position for a long time.  Playing a sport or doing an activity without preparing properly.  Using bad form or technique while playing a sport or doing an activity.  Not having enough water in your body (dehydration).  Injury.  Side effects of some medicines.  Low levels of the salts and minerals in your blood (electrolytes), such as low potassium or calcium. Follow these instructions at home: Managing pain and stiffness      Massage, stretch, and relax the muscle. Do this for many minutes at a time.  If told, put heat on tight or tense muscles as often as told by your doctor. Use the heat source that your doctor recommends, such as a moist heat pack or a heating pad. ? Place a towel between your skin and the heat source. ? Leave the heat on for 20-30 minutes. ? Remove the heat if your skin turns bright red. This is very important if you are not able to feel pain, heat, or cold. You may have a greater risk of getting burned.  If told, put ice on the affected area. This may help if you are sore or have pain after a cramp or spasm. ? Put ice in a plastic bag. ? Place a towel between your skin and the bag. ? Leave the ice on for 20 minutes, 2-3 times a day.  Try taking hot showers or baths to help  relax tight muscles. Eating and drinking  Drink enough fluid to keep your pee (urine) pale yellow.  Eat a healthy diet to help ensure that your muscles work well. This should include: ? Fruits and vegetables. ? Lean protein. ? Whole grains. ? Low-fat or nonfat dairy products. General instructions  If you are having cramps often, avoid intense exercise for several days.  Take over-the-counter and prescription medicines only as told by your doctor.  Watch for any changes in your symptoms.  Keep all follow-up visits as told by your doctor. This is important. Contact a doctor if:  Your cramps or spasms get worse or happen more often.  Your cramps or spasms do not get better with time. Summary  Muscle cramps and spasms are when muscles tighten by themselves. They usually get better within minutes.  Cramps and spasms occur most often in the calf muscles of the leg.  Massage, stretch, and relax the muscle. This may help the cramp or spasm go away.  Drink enough fluid to keep your pee (urine) pale yellow. This information is not intended to replace advice given to you by your health care provider. Make sure you discuss any questions you have with your health care provider. Document Revised: 09/08/2017 Document Reviewed: 09/08/2017 Elsevier Patient Education  2020 [employer]  

## 2019-08-27 NOTE — Telephone Encounter (Signed)
Left voicemail with this information.

## 2019-08-27 NOTE — Telephone Encounter (Signed)
I have sent in refills.  He last filled 07/30/2019 on review, so can not fill until tomorrow.

## 2019-08-28 LAB — COMPREHENSIVE METABOLIC PANEL
ALT: 71 IU/L — ABNORMAL HIGH (ref 0–44)
AST: 67 IU/L — ABNORMAL HIGH (ref 0–40)
Albumin/Globulin Ratio: 1.1 — ABNORMAL LOW (ref 1.2–2.2)
Albumin: 3.9 g/dL (ref 3.8–4.9)
Alkaline Phosphatase: 240 IU/L — ABNORMAL HIGH (ref 39–117)
BUN/Creatinine Ratio: 9 (ref 9–20)
BUN: 6 mg/dL (ref 6–24)
Bilirubin Total: 1.6 mg/dL — ABNORMAL HIGH (ref 0.0–1.2)
CO2: 25 mmol/L (ref 20–29)
Calcium: 9 mg/dL (ref 8.7–10.2)
Chloride: 98 mmol/L (ref 96–106)
Creatinine, Ser: 0.68 mg/dL — ABNORMAL LOW (ref 0.76–1.27)
GFR calc Af Amer: 126 mL/min/{1.73_m2} (ref >59)
GFR calc non Af Amer: 109 mL/min/{1.73_m2} (ref >59)
Globulin, Total: 3.6 g/dL (ref 1.5–4.5)
Glucose: 234 mg/dL — ABNORMAL HIGH (ref 65–99)
Potassium: 4.4 mmol/L (ref 3.5–5.2)
Sodium: 135 mmol/L (ref 134–144)
Total Protein: 7.5 g/dL (ref 6.0–8.5)

## 2019-08-28 NOTE — Progress Notes (Signed)
Sent message via MyChart, please check to ensure he received: Good day Mr. Harold Tate, your labs have returned: - A1C is elevated at 7.8%, anything greater than 6.5% is considered diabetes and recommended to treat.  Last A1C was 5.8%, so definitely have returned to needing Metformin again as you took in past.  I will send this in once I know you have received this message and would like to see you back in 4-6 weeks for follow-up.   - Cholesterol levels are elevated placing you at risk for stroke or heart attack.  We need to start a statin to help prevent this.  Have you taken a statin before? We can start out at a low dose and work our way up based on how your labs do.  I will send this in, along with Metformin once you have notified me you have received this message. - You are passing a lot of protein in your urine, this is due to diabetes.  I HIGHLY recommend we restart your Benazepril which you took in past, only would start at lower dose.  This medicine helps protect kidneys. - Kidney function is stable, but liver enzymes are elevated.  I would like to recheck this in 4-6 weeks at your visit with me.  I highly recommend avoiding alcohol and Tylenol use to help improve these. Any questions?  Please let me know you have received this so we can start Benazepril, a low dose medication for cholesterol, and Metformin ASAP.

## 2019-08-30 ENCOUNTER — Encounter: Payer: Self-pay | Admitting: Nurse Practitioner

## 2019-08-30 ENCOUNTER — Other Ambulatory Visit: Payer: Self-pay | Admitting: Nurse Practitioner

## 2019-08-30 MED ORDER — ROSUVASTATIN CALCIUM 5 MG PO TABS: 5.0000 mg | ORAL_TABLET | Freq: Every day | ORAL | 3 refills | Status: DC

## 2019-08-30 MED ORDER — METFORMIN HCL 500 MG PO TABS: 500.0000 mg | ORAL_TABLET | Freq: Two times a day (BID) | ORAL | 4 refills | Status: DC

## 2019-08-30 MED ORDER — LOSARTAN POTASSIUM 25 MG PO TABS: 25.0000 mg | ORAL_TABLET | Freq: Every day | ORAL | 3 refills | Status: DC

## 2019-08-30 NOTE — Progress Notes (Signed)
Restart of Metformin and statin + Benazepril.

## 2019-09-02 LAB — URINE DRUGS OF ABUSE SCREEN W ALC, ROUTINE (REF LAB)
Amphetamines, Urine: NEGATIVE ng/mL
Barbiturate Quant, Ur: NEGATIVE ng/mL
Benzodiazepine Quant, Ur: NEGATIVE ng/mL
Cocaine (Metab.): NEGATIVE ng/mL
Ethanol, Urine: NEGATIVE %
Methadone Screen, Urine: NEGATIVE ng/mL
Opiate Quant, Ur: NEGATIVE ng/mL
PCP Quant, Ur: NEGATIVE ng/mL
Propoxyphene: NEGATIVE ng/mL

## 2019-09-02 LAB — PANEL 799049
CARBOXY THC GC/MS CONF: 153 ng/mL
Cannabinoid GC/MS, Ur: POSITIVE — AB

## 2019-09-03 ENCOUNTER — Encounter: Payer: Self-pay | Admitting: Nurse Practitioner

## 2019-09-03 ENCOUNTER — Ambulatory Visit: Payer: 59 | Admitting: Nurse Practitioner

## 2019-09-03 ENCOUNTER — Ambulatory Visit
Admission: RE | Admit: 2019-09-03 | Discharge: 2019-09-03 | Disposition: A | Discharge status: Home / Self Care | Payer: 59 | Source: Ambulatory Visit | Attending: Nurse Practitioner | Admitting: Nurse Practitioner

## 2019-09-03 ENCOUNTER — Other Ambulatory Visit: Payer: Self-pay

## 2019-09-03 VITALS — BP 148/99 | HR 90 | Temp 97.8°F | Wt 242.0 lb

## 2019-09-03 DIAGNOSIS — M79604 Pain in right leg: Secondary | ICD-10-CM | POA: Diagnosis not present

## 2019-09-03 DIAGNOSIS — Z86718 Personal history of other venous thrombosis and embolism: Secondary | ICD-10-CM | POA: Insufficient documentation

## 2019-09-03 DIAGNOSIS — I82401 Acute embolism and thrombosis of unspecified deep veins of right lower extremity: Secondary | ICD-10-CM | POA: Insufficient documentation

## 2019-09-03 MED ORDER — RIVAROXABAN 15 MG PO TABS: 15.0000 mg | ORAL_TABLET | Freq: Two times a day (BID) | ORAL | 0 refills | Status: DC

## 2019-09-03 MED ORDER — RIVAROXABAN 20 MG PO TABS: 20.0000 mg | ORAL_TABLET | Freq: Every day | ORAL | 3 refills | Status: DC

## 2019-09-03 NOTE — Progress Notes (Addendum)
BP (!) 148/99   Pulse 90   Temp 97.8 F (36.6 C) (Oral)   Wt 242 lb (109.8 kg)   SpO2 99%   BMI 31.93 kg/m    Subjective:    Patient ID: Harold Tate, male    DOB: 02-18-1966, 54 y.o.   MRN: [id number]  HPI: Harold Tate is a 54 y.o. male  Chief Complaint  Patient presents with  . Leg Pain    R leg, states the back of his leg started hurting extremely bad last night    LEG PAIN Reports leg pain to right side.  Back of right leg started hurting extremely bad last night.  Has history of blood clot to left leg (imaging in 2015 -- resolved on 2018 imaging) and reports it feels like that pain to right leg, was placed on Xarelto after that episode.  Prior to October 2015 left DVT did have a closed fracture left ankle, however this time no reported injuries, travel, or prolonged sitting.  He reports previous PCP told him he would be okay not to take anymore after a period of time.   Duration: days Pain: yes Severity: 6/10  Quality:  dull, aching, cramping, tearing and throbbing Location:  calves Bilateral:  no Onset: sudden Frequency: intermittent Time of  day:   at random Sudden unintentional leg jerking:   no Paresthesias:   no Decreased sensation:  no Weakness:   no Insomnia:   no Fatigue:   no Alleviating factors: nothing Aggravating factors: walking Status: fluctuating Treatments attempted: rest  Relevant past medical, surgical, family and social history reviewed and updated as indicated. Interim medical history since our last visit reviewed. Allergies and medications reviewed and updated.  Review of Systems  Constitutional: Negative for activity change, diaphoresis, fatigue and fever.  Respiratory: Negative for cough, chest tightness, shortness of breath and wheezing.   Cardiovascular: Positive for leg swelling. Negative for chest pain and palpitations.  Gastrointestinal: Negative.   Endocrine: Negative for cold intolerance, heat intolerance, polydipsia,  polyphagia and polyuria.  Musculoskeletal: Positive for arthralgias.  Skin: Negative.   Neurological: Negative.   Psychiatric/Behavioral: Negative.     Per HPI unless specifically indicated above     Objective:    BP (!) 148/99   Pulse 90   Temp 97.8 F (36.6 C) (Oral)   Wt 242 lb (109.8 kg)   SpO2 99%   BMI 31.93 kg/m   Wt Readings from Last 3 Encounters:  09/03/19 242 lb (109.8 kg)  08/27/19 243 lb 6.4 oz (110.4 kg)  03/09/19 240 lb (108.9 kg)    Physical Exam Vitals and nursing note reviewed.  Constitutional:      General: He is awake. He is not in acute distress.    Appearance: He is well-developed. He is obese. He is not ill-appearing.  HENT:     Head: Normocephalic and atraumatic.     Right Ear: Hearing normal. No drainage.     Left Ear: Hearing normal. No drainage.  Eyes:     General: Lids are normal.        Right eye: No discharge.        Left eye: No discharge.     Conjunctiva/sclera: Conjunctivae normal.     Pupils: Pupils are equal, round, and reactive to light.  Neck:     Vascular: No carotid bruit.  Cardiovascular:     Rate and Rhythm: Normal rate and regular rhythm.     Pulses:  Popliteal pulses are 1+ on the right side and 1+ on the left side.       Dorsalis pedis pulses are 2+ on the right side and 2+ on the left side.       Posterior tibial pulses are 2+ on the right side and 2+ on the left side.     Heart sounds: Normal heart sounds, S1 normal and S2 normal. No murmur. No gallop.      Comments: Mild positive Homans right leg and negative left leg.  Right leg mild edema present compared to left.  Varicose veins bilateral legs. Antalgic gait. Pulmonary:     Effort: Pulmonary effort is normal. No accessory muscle usage or respiratory distress.     Breath sounds: Normal breath sounds.  Abdominal:     General: Bowel sounds are normal.     Palpations: Abdomen is soft.  Musculoskeletal:        General: Normal range of motion.     Cervical  back: Normal range of motion and neck supple.     Right lower leg: No edema.     Left lower leg: No edema.  Skin:    General: Skin is warm and dry.     Capillary Refill: Capillary refill takes less than 2 seconds.  Neurological:     Mental Status: He is alert and oriented to person, place, and time.  Psychiatric:        Attention and Perception: Attention normal.        Mood and Affect: Mood normal.        Speech: Speech normal.        Behavior: Behavior normal. Behavior is cooperative.        Thought Content: Thought content normal.     Results for orders placed or performed in visit on 08/27/19  Urine drugs of abuse scrn w alc, routine (Ref Lab)  Result Value Ref Range   Amphetamines, Urine Negative Cutoff=1000 ng/mL   Barbiturate Quant, Ur Negative Cutoff=300 ng/mL   Benzodiazepine Quant, Ur Negative Cutoff=300 ng/mL   Cannabinoid Quant, Ur See Final Results Cutoff=50 ng/mL   Cocaine (Metab.) Negative Cutoff=300 ng/mL   Opiate Quant, Ur Negative Cutoff=300 ng/mL   PCP Quant, Ur Negative Cutoff=25 ng/mL   Methadone Screen, Urine Negative Cutoff=300 ng/mL   Propoxyphene Negative Cutoff=300 ng/mL   Ethanol, Urine Negative Cutoff=0.020 %  Microalbumin, Urine Waived  Result Value Ref Range   Microalb, Ur Waived 80 (H) 0 - 19 mg/L   Creatinine, Urine Waived 200 10 - 300 mg/dL   Microalb/Creat Ratio 30-300 (H) <30 mg/g  Lipid Panel Piccolo, Waived  Result Value Ref Range   Cholesterol Piccolo, Waived 218 (H) <200 mg/dL   HDL Chol Piccolo, Waived 60 >59 mg/dL   Triglycerides Piccolo,Waived 149 <150 mg/dL   Chol/HDL Ratio Piccolo,Waive 3.6 mg/dL   LDL Chol Calc Piccolo Waived 129 (H) <100 mg/dL   VLDL Chol Calc Piccolo,Waive 30 (H) <30 mg/dL  Bayer DCA Hb A1c Waived  Result Value Ref Range   HB A1C (BAYER DCA - WAIVED) 7.8 (H) <7.0 %  Comprehensive metabolic panel  Result Value Ref Range   Glucose 234 (H) 65 - 99 mg/dL   BUN 6 6 - 24 mg/dL   Creatinine, Ser 0.68 (L) 0.76 -  1.27 mg/dL   GFR calc non Af Amer 109 >59 mL/min/1.73   GFR calc Af Amer 126 >59 mL/min/1.73   BUN/Creatinine Ratio 9 9 - 20   Sodium 135 134 -  144 mmol/L   Potassium 4.4 3.5 - 5.2 mmol/L   Chloride 98 96 - 106 mmol/L   CO2 25 20 - 29 mmol/L   Calcium 9.0 8.7 - 10.2 mg/dL   Total Protein 7.5 6.0 - 8.5 g/dL   Albumin 3.9 3.8 - 4.9 g/dL   Globulin, Total 3.6 1.5 - 4.5 g/dL   Albumin/Globulin Ratio 1.1 (L) 1.2 - 2.2   Bilirubin Total 1.6 (H) 0.0 - 1.2 mg/dL   Alkaline Phosphatase 240 (H) 39 - 117 IU/L   AST 67 (H) 0 - 40 IU/L   ALT 71 (H) 0 - 44 IU/L  Panel [id number]  Result Value Ref Range   Cannabinoid GC/MS, Ur Positive (A) Cutoff=50   CARBOXY THC GC/MS CONF 153 Cutoff=10 ng/mL      Assessment & Plan:   Problem List Items Addressed This Visit      Other   Right leg pain - Primary    History of DVT reported to left leg, took Xarelto at some point.  Currently with right leg pain similar to previous left leg.  STAT Doppler U/S ordered.  Labs: D dimer, CBC, BMP, uric acid.  Discussed with patient at length.  Will initiate Xarelto if DVT present.  He has taken this before.  Return in one week.  Immediately go to ER for worsening symptoms.      Relevant Orders   D-Dimer, Quantitative   Uric acid   Basic metabolic panel   CBC with Differential/Platelet   US Venous Img Lower Unilateral Right (Completed)       Follow up plan: Return in about 1 week (around 09/10/2019) for Leg pain.

## 2019-09-03 NOTE — Addendum Note (Signed)
Addended by: Marnee Guarneri T on: 09/03/2019 05:04 PM   Modules accepted: Orders

## 2019-09-03 NOTE — Assessment & Plan Note (Signed)
History of DVT reported to left leg, took Xarelto at some point.  Currently with right leg pain similar to previous left leg.  STAT Doppler U/S ordered.  Labs: D dimer, CBC, BMP, uric acid.  Discussed with patient at length.  Will initiate Xarelto if DVT present.  He has taken this before.  Return in one week.  Immediately go to ER for worsening symptoms.

## 2019-09-03 NOTE — Progress Notes (Addendum)
Mallory from imaging called, reported + ultrasound for DVT to right leg.  Spoke to patient on phone -- will restart Xarelto.  Due to unprovoked nature of DVT will keep on this treatment and next visit (in one week) discuss further referral to hematology for further work-up due to x 2 unprovoked episodes.  Discussed red flag symptoms to be seen immediately for over weekend if present, he verbalized this back and medication.

## 2019-09-03 NOTE — Patient Instructions (Signed)
Leg Cramps Leg cramps occur when one or more muscles tighten and you have no control over this tightening (involuntary muscle contraction). Muscle cramps can develop in any muscle, but the most common place is in the calf muscles of the leg. Those cramps can occur during exercise or when you are at rest. Leg cramps are painful, and they may last for a few seconds to a few minutes. Cramps may return several times before they finally stop. Usually, leg cramps are not caused by a serious medical problem. In many cases, the cause is not known. Some common causes include:  Excessive physical effort (overexertion), such as during intense exercise.  Overuse from repetitive motions, or doing the same thing over and over.  Staying in a certain position for a long period of time.  Improper preparation, form, or technique while performing a sport or an activity.  Dehydration.  Injury.  Side effects of certain medicines.  Abnormally low levels of minerals in your blood (electrolytes), especially potassium and calcium. This could result from: ? Pregnancy. ? Taking diuretic medicines. Follow these instructions at home: Eating and drinking  Drink enough fluid to keep your urine pale yellow. Staying hydrated may help prevent cramps.  Eat a healthy diet that includes plenty of nutrients to help your muscles function. A healthy diet includes fruits and vegetables, lean protein, whole grains, and low-fat or nonfat dairy products. Managing pain, stiffness, and swelling      Try massaging, stretching, and relaxing the affected muscle. Do this for several minutes at a time.  If directed, put ice on areas that are sore or painful after a cramp: ? Put ice in a plastic bag. ? Place a towel between your skin and the bag. ? Leave the ice on for 20 minutes, 2-3 times a day.  If directed, apply heat to muscles that are tense or tight. Do this before you exercise, or as often as told by your health care  provider. Use the heat source that your health care provider recommends, such as a moist heat pack or a heating pad. ? Place a towel between your skin and the heat source. ? Leave the heat on for 20-30 minutes. ? Remove the heat if your skin turns bright red. This is especially important if you are unable to feel pain, heat, or cold. You may have a greater risk of getting burned.  Try taking hot showers or baths to help relax tight muscles. General instructions  If you are having frequent leg cramps, avoid intense exercise for several days.  Take over-the-counter and prescription medicines only as told by your health care provider.  Keep all follow-up visits as told by your health care provider. This is important. Contact a health care provider if:  Your leg cramps get more severe or more frequent, or they do not improve over time.  Your foot becomes cold, numb, or blue. Summary  Muscle cramps can develop in any muscle, but the most common place is in the calf muscles of the leg.  Leg cramps are painful, and they may last for a few seconds to a few minutes.  Usually, leg cramps are not caused by a serious medical problem. Often, the cause is not known.  Stay hydrated and take over-the-counter and prescription medicines only as told by your health care provider. This information is not intended to replace advice given to you by your health care provider. Make sure you discuss any questions you have with your health care  provider. Document Revised: 03/28/2017 Document Reviewed: 01/23/2017 Elsevier Patient Education  2020 [employer]  

## 2019-09-04 LAB — CBC WITH DIFFERENTIAL/PLATELET
Basophils Absolute: 0.1 10*3/uL (ref 0.0–0.2)
Basos: 1 %
EOS (ABSOLUTE): 0.1 10*3/uL (ref 0.0–0.4)
Eos: 2 %
Hematocrit: 47.1 % (ref 37.5–51.0)
Hemoglobin: 16.3 g/dL (ref 13.0–17.7)
Immature Grans (Abs): 0 10*3/uL (ref 0.0–0.1)
Immature Granulocytes: 0 %
Lymphocytes Absolute: 1.2 10*3/uL (ref 0.7–3.1)
Lymphs: 16 %
MCH: 36 pg — ABNORMAL HIGH (ref 26.6–33.0)
MCHC: 34.6 g/dL (ref 31.5–35.7)
MCV: 104 fL — ABNORMAL HIGH (ref 79–97)
Monocytes Absolute: 0.9 10*3/uL (ref 0.1–0.9)
Monocytes: 12 %
Neutrophils Absolute: 5 10*3/uL (ref 1.4–7.0)
Neutrophils: 69 %
Platelets: 143 10*3/uL — ABNORMAL LOW (ref 150–450)
RBC: 4.53 x10E6/uL (ref 4.14–5.80)
RDW: 12.1 % (ref 11.6–15.4)
WBC: 7.3 10*3/uL (ref 3.4–10.8)

## 2019-09-04 LAB — BASIC METABOLIC PANEL
BUN/Creatinine Ratio: 10 (ref 9–20)
BUN: 8 mg/dL (ref 6–24)
CO2: 23 mmol/L (ref 20–29)
Calcium: 9.4 mg/dL (ref 8.7–10.2)
Chloride: 94 mmol/L — ABNORMAL LOW (ref 96–106)
Creatinine, Ser: 0.79 mg/dL (ref 0.76–1.27)
GFR calc Af Amer: 119 mL/min/{1.73_m2} (ref >59)
GFR calc non Af Amer: 103 mL/min/{1.73_m2} (ref >59)
Glucose: 182 mg/dL — ABNORMAL HIGH (ref 65–99)
Potassium: 4.3 mmol/L (ref 3.5–5.2)
Sodium: 132 mmol/L — ABNORMAL LOW (ref 134–144)

## 2019-09-04 LAB — D-DIMER, QUANTITATIVE: D-DIMER: 9.55 mg/L FEU — ABNORMAL HIGH (ref 0.00–0.49)

## 2019-09-04 LAB — URIC ACID: Uric Acid: 4.6 mg/dL (ref 3.8–8.4)

## 2019-09-04 NOTE — Progress Notes (Signed)
Contacted via [place] afternoon Shanon Brow. Your labs have returned and as expected your D Dimer did return elevated, this is goes along with your positive ultrasound for DVT.  Uric acid normal.  Kidney function is normal.  Sodium is a little on low side, which you have been on and off in past too.  I would recommend adding just a little salt to diet.  CBC show no anemia, but platelets continue to be a little on low side, which they have been in past too.  When you had your first DVT had you been on a long trip sitting or had surgery?  I am thinking we may need to send you to hematology since this is your second DVT and this one was unprovoked, meaning you did nothing to put you at risk for it (like long travel and sitting or recent surgery).  It may be good to ensure you have no blood disorder that is causing these to happen.  Would you be okay with a referral to hematology, I do recommend it.  Let me know.  Thank you.

## 2019-09-10 ENCOUNTER — Ambulatory Visit: Payer: 59 | Admitting: Nurse Practitioner

## 2019-09-13 ENCOUNTER — Ambulatory Visit (INDEPENDENT_AMBULATORY_CARE_PROVIDER_SITE_OTHER): Payer: 59 | Admitting: Nurse Practitioner

## 2019-09-13 ENCOUNTER — Other Ambulatory Visit: Payer: Self-pay

## 2019-09-13 ENCOUNTER — Encounter: Payer: Self-pay | Admitting: Nurse Practitioner

## 2019-09-13 DIAGNOSIS — I82411 Acute embolism and thrombosis of right femoral vein: Secondary | ICD-10-CM

## 2019-09-13 NOTE — Progress Notes (Signed)
BP 137/88   Pulse 99   Temp 97.8 F (36.6 C) (Oral)   Wt 238 lb 6.4 oz (108.1 kg)   SpO2 94%   BMI 31.45 kg/m    Subjective:    Patient ID: Harold Tate, male    DOB: 1965/07/27, 54 y.o.   MRN: [id number]  HPI: Harold Tate is a 54 y.o. male  Chief Complaint  Patient presents with  . Leg Pain    1 week f/up- pt states his leg is a little better    LEG PAIN One week follow-up for leg pain, was diagnosed with DVT right leg on 09/03/19 and started on Xarelto.  Imaging noting "Positive for acute DVT at the level of the right femoral vein, popliteal vein and calf veins. Thrombus appears likely acute by ultrasound". D- Dimer was elevated and CBC noted low side platelet count at 143, which on review he has had sporadic lows throughout past couple of years.  At this time he reports pain and swelling has improved.    Has history of blood clot to left leg (imaging in 2015 -- resolved on 2018 imaging) and reports it feels like that pain to right leg, was placed on Xarelto after that episode.  Prior to October 2015 left DVT did have a closed fracture left ankle, however this time no reported injuries, travel, or prolonged sitting.  Does continue to smoke cigarettes daily.  He reports previous PCP told him he would be okay not to take Xarelto anymore after a period of time.   Sudden unintentional leg jerking:   no Paresthesias:   no Decreased sensation:  no Weakness:   no Insomnia:   no Fatigue:   no Alleviating factors: nothing Aggravating factors: walking Status: fluctuating Treatments attempted: rest  Relevant past medical, surgical, family and social history reviewed and updated as indicated. Interim medical history since our last visit reviewed. Allergies and medications reviewed and updated.  Review of Systems  Constitutional: Negative for activity change, diaphoresis, fatigue and fever.  Respiratory: Negative for cough, chest tightness, shortness of breath and wheezing.     Cardiovascular: Negative for chest pain, palpitations and leg swelling.  Gastrointestinal: Negative.   Skin: Negative.   Neurological: Negative.   Psychiatric/Behavioral: Negative.     Per HPI unless specifically indicated above     Objective:    BP 137/88   Pulse 99   Temp 97.8 F (36.6 C) (Oral)   Wt 238 lb 6.4 oz (108.1 kg)   SpO2 94%   BMI 31.45 kg/m   Wt Readings from Last 3 Encounters:  09/13/19 238 lb 6.4 oz (108.1 kg)  09/03/19 242 lb (109.8 kg)  08/27/19 243 lb 6.4 oz (110.4 kg)    Physical Exam Vitals and nursing note reviewed.  Constitutional:      General: He is awake. He is not in acute distress.    Appearance: He is well-developed. He is obese. He is not ill-appearing.  HENT:     Head: Normocephalic and atraumatic.     Right Ear: Hearing normal. No drainage.     Left Ear: Hearing normal. No drainage.  Eyes:     General: Lids are normal.        Right eye: No discharge.        Left eye: No discharge.     Conjunctiva/sclera: Conjunctivae normal.     Pupils: Pupils are equal, round, and reactive to light.  Neck:     Vascular: No carotid  bruit.  Cardiovascular:     Rate and Rhythm: Normal rate and regular rhythm.     Pulses:          Popliteal pulses are 1+ on the right side and 1+ on the left side.       Dorsalis pedis pulses are 2+ on the right side and 2+ on the left side.       Posterior tibial pulses are 2+ on the right side and 2+ on the left side.     Heart sounds: Normal heart sounds, S1 normal and S2 normal. No murmur. No gallop.      Comments: Homans negative right leg and negative left leg.  No edema noted on exam today or warmth. Varicose veins bilateral legs.   Pulmonary:     Effort: Pulmonary effort is normal. No accessory muscle usage or respiratory distress.     Breath sounds: Normal breath sounds.  Abdominal:     General: Bowel sounds are normal.     Palpations: Abdomen is soft.  Musculoskeletal:        General: Normal range of  motion.     Cervical back: Normal range of motion and neck supple.     Right lower leg: No edema.     Left lower leg: No edema.  Skin:    General: Skin is warm and dry.     Capillary Refill: Capillary refill takes less than 2 seconds.  Neurological:     Mental Status: He is alert and oriented to person, place, and time.  Psychiatric:        Attention and Perception: Attention normal.        Mood and Affect: Mood normal.        Speech: Speech normal.        Behavior: Behavior normal. Behavior is cooperative.        Thought Content: Thought content normal.     Results for orders placed or performed in visit on 09/03/19  D-Dimer, Quantitative  Result Value Ref Range   D-DIMER 9.55 (H) 0.00 - 0.49 mg/L FEU  Uric acid  Result Value Ref Range   Uric Acid 4.6 3.8 - 8.4 mg/dL  Basic metabolic panel  Result Value Ref Range   Glucose 182 (H) 65 - 99 mg/dL   BUN 8 6 - 24 mg/dL   Creatinine, Ser 0.79 0.76 - 1.27 mg/dL   GFR calc non Af Amer 103 >59 mL/min/1.73   GFR calc Af Amer 119 >59 mL/min/1.73   BUN/Creatinine Ratio 10 9 - 20   Sodium 132 (L) 134 - 144 mmol/L   Potassium 4.3 3.5 - 5.2 mmol/L   Chloride 94 (L) 96 - 106 mmol/L   CO2 23 20 - 29 mmol/L   Calcium 9.4 8.7 - 10.2 mg/dL  CBC with Differential/Platelet  Result Value Ref Range   WBC 7.3 3.4 - 10.8 x10E3/uL   RBC 4.53 4.14 - 5.80 x10E6/uL   Hemoglobin 16.3 13.0 - 17.7 g/dL   Hematocrit 47.1 37.5 - 51.0 %   MCV 104 (H) 79 - 97 fL   MCH 36.0 (H) 26.6 - 33.0 pg   MCHC 34.6 31.5 - 35.7 g/dL   RDW 12.1 11.6 - 15.4 %   Platelets 143 (L) 150 - 450 x10E3/uL   Neutrophils 69 Not Estab. %   Lymphs 16 Not Estab. %   Monocytes 12 Not Estab. %   Eos 2 Not Estab. %   Basos 1 Not Estab. %  Neutrophils Absolute 5.0 1.4 - 7.0 x10E3/uL   Lymphocytes Absolute 1.2 0.7 - 3.1 x10E3/uL   Monocytes Absolute 0.9 0.1 - 0.9 x10E3/uL   EOS (ABSOLUTE) 0.1 0.0 - 0.4 x10E3/uL   Basophils Absolute 0.1 0.0 - 0.2 x10E3/uL   Immature  Granulocytes 0 Not Estab. %   Immature Grans (Abs) 0.0 0.0 - 0.1 x10E3/uL      Assessment & Plan:   Problem List Items Addressed This Visit      Cardiovascular and Mediastinum   Right leg DVT ([place])    New diagnosis, history of similar to left leg but previous PCP discontinued his Xarelto after some time.  Now with DVT to right leg.  Continue Xarelto BID for total of 21 days and then he is aware to transition to Xarelto 20 MG daily, script has been sent.  Will place referral to hematology for further assessment, as initial DVT most likely related to fracture at time, but this DVT appears unprovoked.  He agrees to hematology referral for further work-up to ensure no underlying cause of DVT x 2.  Referral placed. Return to office as scheduled in 2 months, sooner if any worsening symptoms.      Relevant Orders   Ambulatory referral to Hematology       Follow up plan: Return for Please ensure he is scheduled for upcoming diabetes visit in 2 months.Marland Kitchen

## 2019-09-13 NOTE — Patient Instructions (Signed)

## 2019-09-13 NOTE — Assessment & Plan Note (Signed)
New diagnosis, history of similar to left leg but previous PCP discontinued his Xarelto after some time.  Now with DVT to right leg.  Continue Xarelto BID for total of 21 days and then he is aware to transition to Xarelto 20 MG daily, script has been sent.  Will place referral to hematology for further assessment, as initial DVT most likely related to fracture at time, but this DVT appears unprovoked.  He agrees to hematology referral for further work-up to ensure no underlying cause of DVT x 2.  Referral placed. Return to office as scheduled in 2 months, sooner if any worsening symptoms.

## 2019-09-18 NOTE — Progress Notes (Signed)
[facility]  [phone] [phone]  ID: [name] OB: 24-Nov-1965  MR#: [id number]  [id number]  Patient Care Team: Guadalupe Maple, MD as PCP - General (Family Medicine) Lloyd Huger, MD as Consulting Physician (Oncology)  CHIEF COMPLAINT: Right leg DVT  INTERVAL HISTORY: Patient is a 54 year old male recently diagnosed with an unprovoked right leg DVT.  In approximately 2015, he had a left leg DVT but this was associated with a broken leg, multiple surgeries, and staph infection.  He is anxious, but otherwise feels well.  He has no neurologic complaints.  He denies any recent fevers or illnesses.  He has a good appetite and denies weight loss.  He has no chest pain, shortness of breath, cough, or hemoptysis.  He denies any nausea, vomiting, constipation, or diarrhea.  He has no urinary complaints.  Patient otherwise feels well and offers no further specific complaints today.  REVIEW OF SYSTEMS:   Review of Systems  Constitutional: Negative.  Negative for fever, malaise/fatigue and weight loss.  Respiratory: Negative.  Negative for cough, hemoptysis and shortness of breath.   Cardiovascular: Negative.  Negative for chest pain and leg swelling.  Gastrointestinal: Negative.  Negative for abdominal pain.  Genitourinary: Negative.  Negative for dysuria.  Musculoskeletal: Negative.  Negative for back pain.  Skin: Negative.  Negative for rash.  Neurological: Negative.  Negative for dizziness, focal weakness, weakness and headaches.  Psychiatric/Behavioral: Negative.  The patient is not nervous/anxious.     As per HPI. Otherwise, a complete review of systems is negative.  PAST MEDICAL HISTORY: Past Medical History:  Diagnosis Date  . Anxiety   . Arthritis   . Diabetes mellitus without complication ([place])   . Dyspnea   . Hip pain   . Hypertension   . Raynaud's syndrome   . Sleep apnea     CPAP/ Doesn't use    PAST SURGICAL  HISTORY: Past Surgical History:  Procedure Laterality Date  . I & D EXTREMITY    . OPEN REDUCTION INTERNAL FIXATION (ORIF) TIBIA/FIBULA FRACTURE Left July 2014  . TOTAL HIP ARTHROPLASTY Right 11/03/2018   Procedure: RIGHT TOTAL HIP ARTHROPLASTY ANTERIOR APPROACH;  Surgeon: Hessie Knows, MD;  Location: [facility] ORS;  Service: Orthopedics;  Laterality: Right;  . TOTAL HIP ARTHROPLASTY Left 02/11/2019   Procedure: TOTAL HIP ARTHROPLASTY ANTERIOR APPROACH;  Surgeon: Hessie Knows, MD;  Location: [facility] ORS;  Service: Orthopedics;  Laterality: Left;    FAMILY HISTORY: Family History  Problem Relation Age of Onset  . Depression Sister     ADVANCED DIRECTIVES (Y/N):  N  HEALTH MAINTENANCE: Social History   Tobacco Use  . Smoking status: Current Every Day Smoker    Packs/day: 1.00    Years: 27.00    Pack years: 27.00    Types: Cigarettes  . Smokeless tobacco: Never Used  Substance Use Topics  . Alcohol use: Yes    Alcohol/week: 14.0 standard drinks    Types: 14 Cans of beer per week  . Drug use: Not Currently    Types: Benzodiazepines     Colonoscopy:  PAP:  Bone density:  Lipid panel:  No Known Allergies  Current Outpatient Medications  Medication Sig Dispense Refill  . baclofen (LIORESAL) 10 MG tablet Take 1 tablet (10 mg total) by mouth as needed for muscle spasms (three times a day as needed for muscle spasms). 90 each 2  . clonazePAM (KLONOPIN) 0.5 MG tablet Take 1 tablet (0.5 mg total) by mouth 3 (  three) times daily as needed for anxiety. 90 tablet 3  . losartan (COZAAR) 25 MG tablet Take 1 tablet (25 mg total) by mouth daily. 90 tablet 3  . metFORMIN (GLUCOPHAGE) 500 MG tablet Take 1 tablet (500 mg total) by mouth 2 (two) times daily with a meal. (Patient taking differently: Take 500 mg by mouth daily with breakfast. ) 180 tablet 4  . Rivaroxaban (XARELTO) 15 MG TABS tablet Take 1 tablet (15 mg total) by mouth 2 (two) times daily with a meal for 21 days. After completed full  21 days of 15 MG tablets then start the 20 MG tablets. 42 tablet 0  . rosuvastatin (CRESTOR) 5 MG tablet Take 1 tablet (5 mg total) by mouth daily. 90 tablet 3   No current facility-administered medications for this visit.    OBJECTIVE: Vitals:   09/20/19 1336  BP: (!) 156/105  Pulse: 96  Resp: 20  Temp: (!) 96.9 F (36.1 C)  SpO2: 96%     Body mass index is 30.78 kg/m.    ECOG FS:0 - Asymptomatic  General: Well-developed, well-nourished, no acute distress. Eyes: Pink conjunctiva, anicteric sclera. HEENT: Normocephalic, moist mucous membranes. Lungs: No audible wheezing or coughing. Heart: Regular rate and rhythm. Abdomen: Soft, nontender, no obvious distention. Musculoskeletal: No edema, cyanosis, or clubbing. Neuro: Alert, answering all questions appropriately. Cranial nerves grossly intact. Skin: No rashes or petechiae noted. Psych: Normal affect. Lymphatics: No cervical, calvicular, axillary or inguinal LAD.   LAB RESULTS:  Lab Results  Component Value Date   NA 132 (L) 09/03/2019   K 4.3 09/03/2019   CL 94 (L) 09/03/2019   CO2 23 09/03/2019   GLUCOSE 182 (H) 09/03/2019   BUN 8 09/03/2019   CREATININE 0.79 09/03/2019   CALCIUM 9.4 09/03/2019   PROT 7.5 08/27/2019   ALBUMIN 3.9 08/27/2019   AST 67 (H) 08/27/2019   ALT 71 (H) 08/27/2019   ALKPHOS 240 (H) 08/27/2019   BILITOT 1.6 (H) 08/27/2019   GFRNONAA 103 09/03/2019   GFRAA 119 09/03/2019    Lab Results  Component Value Date   WBC 7.3 09/03/2019   NEUTROABS 5.0 09/03/2019   HGB 16.3 09/03/2019   HCT 47.1 09/03/2019   MCV 104 (H) 09/03/2019   PLT 143 (L) 09/03/2019     STUDIES: US Venous Img Lower Unilateral Right  Result Date: 09/03/2019 CLINICAL DATA:  Right lower extremity pain and edema. History prior left lower extremity DVT. EXAM: RIGHT LOWER EXTREMITY VENOUS DOPPLER ULTRASOUND TECHNIQUE: Gray-scale sonography with graded compression, as well as color Doppler and duplex ultrasound were  performed to evaluate the lower extremity deep venous systems from the level of the common femoral vein and including the common femoral, femoral, profunda femoral, popliteal and calf veins including the posterior tibial, peroneal and gastrocnemius veins when visible. The superficial great saphenous vein was also interrogated. Spectral Doppler was utilized to evaluate flow at rest and with distal augmentation maneuvers in the common femoral, femoral and popliteal veins. COMPARISON:  Left lower extremity venous duplex ultrasound on 07/25/2016 FINDINGS: Contralateral Common Femoral Vein: Respiratory phasicity is normal and symmetric with the symptomatic side. No evidence of thrombus. Normal compressibility. Common Femoral Vein: No evidence of thrombus. Normal compressibility, respiratory phasicity and response to augmentation. Saphenofemoral Junction: No evidence of thrombus. Normal compressibility and flow on color Doppler imaging. Profunda Femoral Vein: No evidence of thrombus. Normal compressibility and flow on color Doppler imaging. Femoral Vein: Occlusive thrombus present which appears likely acute. Popliteal Vein: Occlusive  thrombus present which appears likely acute. Calf Veins: Thrombus present in the posterior tibial, peroneal and gastrocnemius veins. Superficial Great Saphenous Vein: No evidence of thrombus. Normal compressibility. Venous Reflux:  None. Other Findings: No evidence of superficial thrombophlebitis or abnormal fluid collection. IMPRESSION: Positive for acute DVT at the level of the right femoral vein, popliteal vein and calf veins. Thrombus appears likely acute by ultrasound. Electronically Signed   By: Aletta Edouard M.D.   On: 09/03/2019 16:30    ASSESSMENT: Right leg DVT  PLAN:    1. Right leg DVT: Appears unprovoked.  Patient does not have any obvious transient risk factors.  He has a personal history of DVT.  Left leg DVT in 2015 was associated with a broken leg, surgery, and staph  infection.  He has no family history of DVT.  He will require Xarelto for a minimum of 3 to 6 months.  Have initiated full hypercoagulable work-up today and results are pending at time of dictation.  Patient will have video assisted telemedicine visit in 3 weeks to discuss the results and whether or not he will require lifelong anticoagulation.  I spent a total of 45 minutes reviewing chart data, face-to-face evaluation with the patient, counseling and coordination of care as detailed above.   Patient expressed understanding and was in agreement with this plan. He also understands that He can call clinic at any time with any questions, concerns, or complaints.     Lloyd Huger, MD   09/21/2019 6:17 AM

## 2019-09-20 ENCOUNTER — Inpatient Hospital Stay: Payer: 59

## 2019-09-20 ENCOUNTER — Encounter: Payer: Self-pay | Admitting: Oncology

## 2019-09-20 ENCOUNTER — Other Ambulatory Visit: Payer: Self-pay

## 2019-09-20 ENCOUNTER — Inpatient Hospital Stay: Payer: 59 | Attending: Oncology | Admitting: Oncology

## 2019-09-20 VITALS — BP 156/105 | HR 96 | Temp 96.9°F | Resp 20 | Wt 233.3 lb

## 2019-09-20 DIAGNOSIS — M79604 Pain in right leg: Secondary | ICD-10-CM | POA: Diagnosis not present

## 2019-09-20 DIAGNOSIS — Z79899 Other long term (current) drug therapy: Secondary | ICD-10-CM | POA: Diagnosis not present

## 2019-09-20 DIAGNOSIS — F419 Anxiety disorder, unspecified: Secondary | ICD-10-CM

## 2019-09-20 DIAGNOSIS — I10 Essential (primary) hypertension: Secondary | ICD-10-CM | POA: Diagnosis not present

## 2019-09-20 DIAGNOSIS — Z7984 Long term (current) use of oral hypoglycemic drugs: Secondary | ICD-10-CM | POA: Diagnosis not present

## 2019-09-20 DIAGNOSIS — I82411 Acute embolism and thrombosis of right femoral vein: Secondary | ICD-10-CM | POA: Diagnosis present

## 2019-09-20 DIAGNOSIS — E119 Type 2 diabetes mellitus without complications: Secondary | ICD-10-CM | POA: Insufficient documentation

## 2019-09-20 DIAGNOSIS — Z86718 Personal history of other venous thrombosis and embolism: Secondary | ICD-10-CM

## 2019-09-20 DIAGNOSIS — Z7289 Other problems related to lifestyle: Secondary | ICD-10-CM | POA: Insufficient documentation

## 2019-09-20 DIAGNOSIS — Z7901 Long term (current) use of anticoagulants: Secondary | ICD-10-CM | POA: Diagnosis not present

## 2019-09-20 DIAGNOSIS — Z818 Family history of other mental and behavioral disorders: Secondary | ICD-10-CM | POA: Insufficient documentation

## 2019-09-20 DIAGNOSIS — M1990 Unspecified osteoarthritis, unspecified site: Secondary | ICD-10-CM | POA: Insufficient documentation

## 2019-09-20 DIAGNOSIS — F17210 Nicotine dependence, cigarettes, uncomplicated: Secondary | ICD-10-CM | POA: Diagnosis not present

## 2019-09-20 LAB — ANTITHROMBIN III: AntiThromb III Func: 63 % — ABNORMAL LOW (ref 75–120)

## 2019-09-20 NOTE — Progress Notes (Signed)
Patient is here today as a new patient for initial visit. Patient reports some weakness. States he is having some numbness in ankles and feet. Denies any pain.

## 2019-09-21 LAB — PROTEIN S, TOTAL: Protein S Ag, Total: 100 % (ref 60–150)

## 2019-09-21 LAB — BETA-2-GLYCOPROTEIN I ABS, IGG/M/A
Beta-2 Glyco I IgG: <9 GPI IgG units (ref 0–20)
Beta-2-Glycoprotein I IgA: <9 GPI IgA units (ref 0–25)
Beta-2-Glycoprotein I IgM: 16 GPI IgM units (ref 0–32)

## 2019-09-21 LAB — PROTEIN C ACTIVITY: Protein C Activity: 65 % — ABNORMAL LOW (ref 73–180)

## 2019-09-21 LAB — CARDIOLIPIN ANTIBODIES, IGG, IGM, IGA
Anticardiolipin IgA: <9 APL U/mL (ref 0–11)
Anticardiolipin IgG: <9 GPL U/mL (ref 0–14)
Anticardiolipin IgM: 16 MPL U/mL — ABNORMAL HIGH (ref 0–12)

## 2019-09-21 LAB — PROTEIN S ACTIVITY: Protein S Activity: 140 % (ref 63–140)

## 2019-09-22 LAB — LUPUS ANTICOAGULANT PANEL
DRVVT: 80 s — ABNORMAL HIGH (ref 0.0–47.0)
PTT Lupus Anticoagulant: 43.3 s (ref 0.0–51.9)

## 2019-09-22 LAB — DRVVT CONFIRM: dRVVT Confirm: 1.3 ratio — ABNORMAL HIGH (ref 0.8–1.2)

## 2019-09-22 LAB — DRVVT MIX: dRVVT Mix: 64.5 s — ABNORMAL HIGH (ref 0.0–40.4)

## 2019-09-22 LAB — PROTEIN C, TOTAL: Protein C, Total: 64 % (ref 60–150)

## 2019-09-23 LAB — PROTHROMBIN GENE MUTATION

## 2019-09-23 LAB — FACTOR 5 LEIDEN

## 2019-09-24 ENCOUNTER — Ambulatory Visit: Payer: 59 | Admitting: Nurse Practitioner

## 2019-10-04 ENCOUNTER — Telehealth: Payer: Self-pay

## 2019-10-04 ENCOUNTER — Other Ambulatory Visit: Payer: Self-pay | Admitting: Nurse Practitioner

## 2019-10-04 MED ORDER — METFORMIN HCL ER 500 MG PO TB24: 1000.0000 mg | ORAL_TABLET | Freq: Every day | ORAL | 3 refills | Status: DC

## 2019-10-04 NOTE — Telephone Encounter (Signed)
LVM on patient's cell medication had been sent. DPR Reviewed.

## 2019-10-04 NOTE — Telephone Encounter (Signed)
Copied from [place] [id number]. Topic: General - Inquiry >> Oct 04, 2019 11:10 AM Percell Belt A wrote: Reason for CRM: pt called in and stated that he would like to know if he could try the time release metFORMIN (GLUCOPHAGE) 500 MG tablet [[id number]] .   Best number [place] [facility]

## 2019-10-04 NOTE — Telephone Encounter (Signed)
Sent in extended release prescription as requested.

## 2019-10-09 NOTE — Progress Notes (Signed)
[facility]  [phone] [phone]  ID: Harold Tate OB: [birth year]  MR#: [id number]  ZSW#:[id number]  Patient Care Team: Guadalupe Maple, MD as PCP - General (Family Medicine) Lloyd Huger, MD as Consulting Physician (Oncology)  I connected with Harold Tate on 10/09/19 at  3:00 PM EDT by video enabled telemedicine visit and verified that I am speaking with the correct person using two identifiers.   I discussed the limitations, risks, security and privacy concerns of performing an evaluation and management service by telemedicine and the availability of in-person appointments. I also discussed with the patient that there may be a patient responsible charge related to this service. The patient expressed understanding and agreed to proceed.   Other persons participating in the visit and their role in the encounter: Patient, MD.  Patient's location: Home. Provider's location: Clinic.  CHIEF COMPLAINT: Right leg DVT  INTERVAL HISTORY: Patient agreed to video assisted telemedicine visit for further evaluation and discussion of his laboratory work.  He currently feels well and is asymptomatic.  He is tolerating Xarelto without significant side effects.  He has no neurologic complaints.  He denies any recent fevers or illnesses.  He has a good appetite and denies weight loss.  He has no chest pain, shortness of breath, cough, or hemoptysis.  He denies any nausea, vomiting, constipation, or diarrhea.  He has no urinary complaints.  Patient offers no specific complaints today.  REVIEW OF SYSTEMS:   Review of Systems  Constitutional: Negative.  Negative for fever, malaise/fatigue and weight loss.  Respiratory: Negative.  Negative for cough, hemoptysis and shortness of breath.   Cardiovascular: Negative.  Negative for chest pain and leg swelling.  Gastrointestinal: Negative.  Negative for abdominal pain.  Genitourinary: Negative.  Negative for  dysuria.  Musculoskeletal: Negative.  Negative for back pain.  Skin: Negative.  Negative for rash.  Neurological: Negative.  Negative for dizziness, focal weakness, weakness and headaches.  Psychiatric/Behavioral: Negative.  The patient is not nervous/anxious.     As per HPI. Otherwise, a complete review of systems is negative.  PAST MEDICAL HISTORY: Past Medical History:  Diagnosis Date  . Anxiety   . Arthritis   . Diabetes mellitus without complication ([place])   . Dyspnea   . Hip pain   . Hypertension   . Raynaud's syndrome   . Sleep apnea     CPAP/ Doesn't use    PAST SURGICAL HISTORY: Past Surgical History:  Procedure Laterality Date  . I & D EXTREMITY    . OPEN REDUCTION INTERNAL FIXATION (ORIF) TIBIA/FIBULA FRACTURE Left July 2014  . TOTAL HIP ARTHROPLASTY Right 11/03/2018   Procedure: RIGHT TOTAL HIP ARTHROPLASTY ANTERIOR APPROACH;  Surgeon: Hessie Knows, MD;  Location: [facility] OR[facility];  Service: Orthopedics;  Laterality: Right;  . TOTAL HIP ARTHROPLASTY Left 02/11/2019   Procedure: TOTAL HIP ARTHROPLASTY ANTERIOR APPROACH;  Surgeon: Hessie Knows, MD;  Location: [facility] OR[facility];  Service: Orthopedics;  Laterality: Left;    FAMILY HISTORY: Family History  Problem Relation Age of Onset  . Depression Sister     ADVANCED DIRECTIVES (Y/N):  N  HEALTH MAINTENANCE: Social History   Tobacco Use  . Smoking status: Current Every Day Smoker    Packs/day: 1.00    Years: 27.00    Pack years: 27.00    Types: Cigarettes  . Smokeless tobacco: Never Used  Vaping Use  . Vaping Use: Never used  Substance Use Topics  . Alcohol use: Yes  Alcohol/week: 14.0 standard drinks    Types: 14 Cans of beer per week  . Drug use: Not Currently    Types: Benzodiazepines     Colonoscopy:  PAP:  Bone density:  Lipid panel:  No Known Allergies  Current Outpatient Medications  Medication Sig Dispense Refill  . baclofen (LIORESAL) 10 MG tablet Take 1 tablet (10 mg total) by mouth as needed  for muscle spasms (three times a day as needed for muscle spasms). 90 each 2  . clonazePAM (KLONOPIN) 0.5 MG tablet Take 1 tablet (0.5 mg total) by mouth 3 (three) times daily as needed for anxiety. 90 tablet 3  . losartan (COZAAR) 25 MG tablet Take 1 tablet (25 mg total) by mouth daily. 90 tablet 3  . metFORMIN (GLUCOPHAGE-XR) 500 MG 24 hr tablet Take 2 tablets (1,000 mg total) by mouth daily with breakfast. 180 tablet 3  . Rivaroxaban (XARELTO) 15 MG TABS tablet Take 1 tablet (15 mg total) by mouth 2 (two) times daily with a meal for 21 days. After completed full 21 days of 15 MG tablets then start the 20 MG tablets. 42 tablet 0  . rosuvastatin (CRESTOR) 5 MG tablet Take 1 tablet (5 mg total) by mouth daily. 90 tablet 3   No current facility-administered medications for this visit.    OBJECTIVE: There were no vitals filed for this visit.   There is no height or weight on file to calculate BMI.    ECOG FS:0 - Asymptomatic  General: Well-developed, well-nourished, no acute distress. HEENT: Normocephalic. Neuro: Alert, answering all questions appropriately. Cranial nerves grossly intact. Psych: Normal affect.  LAB RESULTS:  Lab Results  Component Value Date   NA 132 (L) 09/03/2019   K 4.3 09/03/2019   CL 94 (L) 09/03/2019   CO2 23 09/03/2019   GLUCOSE 182 (H) 09/03/2019   BUN 8 09/03/2019   CREATININE 0.79 09/03/2019   CALCIUM 9.4 09/03/2019   PROT 7.5 08/27/2019   ALBUMIN 3.9 08/27/2019   AST 67 (H) 08/27/2019   ALT 71 (H) 08/27/2019   ALKPHOS 240 (H) 08/27/2019   BILITOT 1.6 (H) 08/27/2019   GFRNONAA 103 09/03/2019   GFRAA 119 09/03/2019    Lab Results  Component Value Date   WBC 7.3 09/03/2019   NEUTROABS 5.0 09/03/2019   HGB 16.3 09/03/2019   HCT 47.1 09/03/2019   MCV 104 (H) 09/03/2019   PLT 143 (L) 09/03/2019     STUDIES: No results found.  ASSESSMENT: Right leg DVT  PLAN:    1. Right leg DVT: Appears unprovoked.  Patient does not have any obvious  transient risk factors.  He has a personal history of DVT.  Left leg DVT in 2015 was associated with a broken leg, surgery, and staph infection.  He has no family history of DVT.  Given the extent of the DVT, have recommended Xarelto for a total of 6 months.  Hypercoagulable work-up did reveal some minor abnormalities, but these all can be contributed to either being on Xarelto or the blood clot itself.  These results do not necessarily indicate an underlying clotting disorder.  Patient has been instructed to discontinue Xarelto at the end of November 2021.  He will then have repeat laboratory work 8 weeks later followed by video assisted telemedicine visit.  I provided 30 minutes of face-to-face video visit time during this encounter which included chart review, counseling, and coordination of care as documented above.   Patient expressed understanding and was in agreement with  this plan. He also understands that He can call clinic at any time with any questions, concerns, or complaints.     Lloyd Huger, MD   10/09/2019 7:42 AM

## 2019-10-11 ENCOUNTER — Encounter: Payer: Self-pay | Admitting: Oncology

## 2019-10-11 ENCOUNTER — Inpatient Hospital Stay: Payer: 59 | Attending: Oncology | Admitting: Oncology

## 2019-10-11 ENCOUNTER — Other Ambulatory Visit: Payer: Self-pay

## 2019-10-11 DIAGNOSIS — I82411 Acute embolism and thrombosis of right femoral vein: Secondary | ICD-10-CM | POA: Diagnosis not present

## 2019-11-16 ENCOUNTER — Ambulatory Visit: Payer: 59 | Admitting: Nurse Practitioner

## 2019-12-08 ENCOUNTER — Ambulatory Visit: Payer: 59 | Admitting: Nurse Practitioner

## 2019-12-10 ENCOUNTER — Ambulatory Visit: Payer: 59 | Admitting: Nurse Practitioner

## 2019-12-22 ENCOUNTER — Encounter: Payer: Self-pay | Admitting: Nurse Practitioner

## 2019-12-22 ENCOUNTER — Telehealth: Payer: Self-pay | Admitting: Family Medicine

## 2019-12-22 ENCOUNTER — Telehealth (INDEPENDENT_AMBULATORY_CARE_PROVIDER_SITE_OTHER): Payer: 59 | Admitting: Nurse Practitioner

## 2019-12-22 DIAGNOSIS — Z79899 Other long term (current) drug therapy: Secondary | ICD-10-CM

## 2019-12-22 DIAGNOSIS — I82411 Acute embolism and thrombosis of right femoral vein: Secondary | ICD-10-CM

## 2019-12-22 DIAGNOSIS — E1169 Type 2 diabetes mellitus with other specified complication: Secondary | ICD-10-CM | POA: Diagnosis not present

## 2019-12-22 DIAGNOSIS — R42 Dizziness and giddiness: Secondary | ICD-10-CM

## 2019-12-22 DIAGNOSIS — F321 Major depressive disorder, single episode, moderate: Secondary | ICD-10-CM | POA: Diagnosis not present

## 2019-12-22 DIAGNOSIS — E1159 Type 2 diabetes mellitus with other circulatory complications: Secondary | ICD-10-CM

## 2019-12-22 DIAGNOSIS — E538 Deficiency of other specified B group vitamins: Secondary | ICD-10-CM

## 2019-12-22 DIAGNOSIS — F411 Generalized anxiety disorder: Secondary | ICD-10-CM

## 2019-12-22 DIAGNOSIS — I152 Hypertension secondary to endocrine disorders: Secondary | ICD-10-CM

## 2019-12-22 DIAGNOSIS — I10 Essential (primary) hypertension: Secondary | ICD-10-CM

## 2019-12-22 DIAGNOSIS — E785 Hyperlipidemia, unspecified: Secondary | ICD-10-CM

## 2019-12-22 DIAGNOSIS — F17210 Nicotine dependence, cigarettes, uncomplicated: Secondary | ICD-10-CM

## 2019-12-22 MED ORDER — CLONAZEPAM 0.5 MG PO TABS: ORAL_TABLET | ORAL | 3 refills | Status: DC

## 2019-12-22 MED ORDER — MECLIZINE HCL 12.5 MG PO TABS: 12.5000 mg | ORAL_TABLET | Freq: Three times a day (TID) | ORAL | 0 refills | Status: DC | PRN

## 2019-12-22 NOTE — Assessment & Plan Note (Signed)
Chronic, ongoing.  Would benefit from SSRI, such as Zoloft, but refuses this.  Refills sent on Klonopin, refer to anxiety plan.

## 2019-12-22 NOTE — Assessment & Plan Note (Signed)
I have recommended complete cessation of tobacco use. I have discussed various options available for assistance with tobacco cessation including over the counter methods (Nicotine gum, patch and lozenges). We also discussed prescription options (Chantix, Nicotine Inhaler / Nasal Spray). The patient is not interested in pursuing any prescription tobacco cessation options at this time.  

## 2019-12-22 NOTE — Assessment & Plan Note (Signed)
Chronic, ongoing.  Has been on benzo long term, recommended trial SSRI which he refuses at this time.  Discussed at length risks of long term benzo use.  He is aware of risks.  Refill sent #90 plus 2 refills.  UDS up to date -- obtain contract next visit.

## 2019-12-22 NOTE — Patient Instructions (Signed)

## 2019-12-22 NOTE — Telephone Encounter (Signed)
-----   Message from Venita Lick, NP sent at 12/22/2019 12:17 PM EDT ----- 3 month follow-up need + needs outpatient labs in 2 weeks please

## 2019-12-22 NOTE — Assessment & Plan Note (Signed)
Chronic, ongoing.  Has been on benzo long term, recommended trial of SSRI which he does not wish to try at this time.  Discussed at length risks of long term benzo use.  He is aware of risks.  Refill sent #90 plus 2 refills.  Will have return to office in 3 months.  UDS up to date.

## 2019-12-22 NOTE — Assessment & Plan Note (Signed)
DVT to right leg, history of DVT to left leg in past, unprovoked.  Continue Xarelto 20 MG daily and collaboration with hematology, he is aware to follow-up with them.  On review of hematology note to continue Xarelto until November 2021.  Return to office in 3 months, sooner if any worsening symptoms.

## 2019-12-22 NOTE — Progress Notes (Signed)
There were no vitals taken for this visit.   Subjective:    Patient ID: Harold Tate, male    DOB: [birth year], 54 y.o.   MRN: [id number]  HPI: Harold Tate is a 54 y.o. male  Chief Complaint  Patient presents with  . Diabetes  . Dizziness  . Anxiety    . This visit was completed via MyChart due to the restrictions of the COVID-19 pandemic. All issues as above were discussed and addressed. Physical exam was done as above through visual confirmation on MyChart. If it was felt that the patient should be evaluated in the office, they were directed there. The patient verbally consented to this visit. . Location of the patient: home . Location of the provider: work . Those involved with this call:  . Provider: Marnee Guarneri, DNP . CMA: Yvonna Alanis, CMA . Front Desk/Registration: Don Perking  . Time spent on call: 15 minutes with patient face to face via video conference. More than 50% of this time was spent in counseling and coordination of care. 10 minutes total spent in review of patient's record and preparation of their chart.  . I verified patient identity using two factors (patient name and date of birth). Patient consents verbally to being seen via telemedicine visit today.    DIABETES Last A1C 7.8% in April, restarted on Metformin XR 1000 MG daily.  Has been working on diet changes.  He reports dizziness for years and has had to "have crystals adjusted in ears".  Has had issues with this for last couple years.  Not bothering him today.  Has not taken Meclizine in past.  Does not check sugars during dizzy episodes -- recommended he do this. Hypoglycemic episodes:no Polydipsia/polyuria: no Visual disturbance: no Chest pain: no Paresthesias: no Glucose Monitoring: no             Accucheck frequency: Not Checking             Fasting glucose:             Post prandial:             Evening:             Before meals: Taking Insulin?: no             Long acting  insulin:             Short acting insulin: Blood Pressure Monitoring: not checking Retinal Examination: Not up to Date Foot Exam: Not up to Date Pneumovax: Not up to Date Influenza: Not up to Date Aspirin: no   HYPERTENSION / HYPERLIPIDEMIA Taking Losartan 25 MG daily, Crestor 5 Mg daily, and Xarelto for recent DVT 20 MG daily.  Was seen by hematology due to history of DVT with recurrent DVT recently -- he is to stop taking Xarelto in November after he sees hematology for follow-up Satisfied with current treatment? yes Duration of hypertension: chronic BP monitoring frequency: not checking BP range:  BP medication side effects: no Duration of hyperlipidemia: chronic Cholesterol medication side effects: no Cholesterol supplements: none Medication compliance: good compliance Aspirin: no Recent stressors: no Recurrent headaches: no Visual changes: no Palpitations: no Dyspnea: no Chest pain: no Lower extremity edema: no Dizzy/lightheaded: no   ANXIETY/STRESS Continues on Clonazepam 0.5 MG TID ordered by previous PCP. Has been on this for several years, used to be Xanax but was changed. Pt is aware of risks of benzo medication use to include increased sedation, respiratory suppression, falls,  extrapyramidal movements,  dependence and cardiovascular events.  Pt would like to continue treatment as benefit determined to outweigh risk. Discussed current research with him.  He reports in past when he tried to quit taking Xanax, it became worse and then he broke out in hives.    Has tried Wellbutrin and Prozac in past without success. Wellbutrin caused weird thoughts.  On review of PDMP his last Klonopin fill was 11/26/19, no other recent controlled substance fills.  Last other fill was Tramadol for an acute pain episode on 03/22/19. Duration:stable Anxious mood: no  Excessive worrying: no Irritability: no  Sweating: no Nausea: no Palpitations:no Hyperventilation: no Panic attacks:  no Agoraphobia: no  Obscessions/compulsions: no Depressed mood: no Depression screen [facility] 2/9 12/22/2019 06/10/2019 03/09/2019 05/05/2018 10/23/2016  Decreased Interest 0 0 0 0 3  Down, Depressed, Hopeless 0 0 0 0 3  PHQ - 2 Score 0 0 0 0 6  Altered sleeping 1 0 0 0 3  Tired, decreased energy 0 0 0 0 3  Change in appetite 0 0 0 1 1  Feeling bad or failure about yourself  0 0 0 0 1  Trouble concentrating 0 0 0 0 1  Moving slowly or fidgety/restless 2 0 0 0 1  Suicidal thoughts 0 0 0 0 0  PHQ-9 Score 3 0 0 1 16  Difficult doing work/chores - Not difficult at all Not difficult at all Not difficult at all -   Anhedonia: no Weight changes: no Insomnia: none Hypersomnia: no Fatigue/loss of energy: no Feelings of worthlessness: no Feelings of guilt: no Impaired concentration/indecisiveness: no Suicidal ideations: no  Crying spells: no Recent Stressors/Life Changes: no   Relationship problems: no   Family stress: no     Financial stress: no    Job stress: no    Recent death/loss: no GAD 7 : Generalized Anxiety Score 12/22/2019 06/10/2019 03/09/2019 05/05/2018  Nervous, Anxious, on Edge 2 1 3 3   Control/stop worrying 1 1 3 2   Worry too much - different things 1 1 3 2   Trouble relaxing 0 0 2 2  Restless 0 0 - 2  Easily annoyed or irritable 0 0 2 0  Afraid - awful might happen 2 0 2 2  Total GAD 7 Score 6 3 - 13  Anxiety Difficulty - Not difficult at all Not difficult at all Somewhat difficult    Relevant past medical, surgical, family and social history reviewed and updated as indicated. Interim medical history since our last visit reviewed. Allergies and medications reviewed and updated.  Review of Systems  Constitutional: Negative for activity change, diaphoresis, fatigue and fever.  Respiratory: Negative for cough, chest tightness, shortness of breath and wheezing.   Cardiovascular: Negative for chest pain, palpitations and leg swelling.  Gastrointestinal: Negative.   Endocrine:  Negative for polydipsia, polyphagia and polyuria.  Neurological: Negative.   Psychiatric/Behavioral: Negative for decreased concentration, self-injury, sleep disturbance and suicidal ideas. The patient is not nervous/anxious.     Per HPI unless specifically indicated above     Objective:    There were no vitals taken for this visit.  Wt Readings from Last 3 Encounters:  09/20/19 233 lb 4.8 oz (105.8 kg)  09/13/19 238 lb 6.4 oz (108.1 kg)  09/03/19 242 lb (109.8 kg)    Physical Exam Vitals and nursing note reviewed.  Constitutional:      General: He is awake. He is not in acute distress.    Appearance: He is well-developed. He is  not ill-appearing.  HENT:     Head: Normocephalic.     Right Ear: Hearing normal. No drainage.     Left Ear: Hearing normal. No drainage.  Eyes:     General: Lids are normal.        Right eye: No discharge.        Left eye: No discharge.     Conjunctiva/sclera: Conjunctivae normal.  Pulmonary:     Effort: Pulmonary effort is normal. No accessory muscle usage or respiratory distress.  Musculoskeletal:     Cervical back: Normal range of motion.  Neurological:     Mental Status: He is alert and oriented to person, place, and time.  Psychiatric:        Mood and Affect: Mood normal.        Behavior: Behavior normal. Behavior is cooperative.        Thought Content: Thought content normal.        Judgment: Judgment normal.     Results for orders placed or performed in visit on 09/20/19  Cardiolipin antibodies, IgG, IgM, IgA  Result Value Ref Range   Anticardiolipin IgG <9 0 - 14 GPL U/mL   Anticardiolipin IgM 16 (H) 0 - 12 MPL U/mL   Anticardiolipin IgA <9 0 - 11 APL U/mL  Lupus anticoagulant panel  Result Value Ref Range   PTT Lupus Anticoagulant 43.3 0.0 - 51.9 sec   DRVVT 80.0 (H) 0.0 - 47.0 sec   Lupus Anticoag Interp Comment:   Protein C activity  Result Value Ref Range   Protein C Activity 65 (L) 73 - 180 %  Protein C, total  Result  Value Ref Range   Protein C, Total 64 60 - 150 %  Protein S, total  Result Value Ref Range   Protein S Ag, Total 100 60 - 150 %  Protein S activity  Result Value Ref Range   Protein S Activity 140 63 - 140 %  Antithrombin III  Result Value Ref Range   AntiThromb III Func 63 (L) 75 - 120 %  Factor 5 leiden  Result Value Ref Range   Recommendations-F5LEID: Comment   Prothrombin gene mutation (Factor 2 Mutation)  Result Value Ref Range   Recommendations-PTGENE: Comment   Beta-2-glycoprotein i abs, IgG/M/A  Result Value Ref Range   Beta-2 Glyco I IgG <9 0 - 20 GPI IgG units   Beta-2-Glycoprotein I IgM 16 0 - 32 GPI IgM units   Beta-2-Glycoprotein I IgA <9 0 - 25 GPI IgA units  dRVVT Mix  Result Value Ref Range   dRVVT Mix 64.5 (H) 0.0 - 40.4 sec  dRVVT Confirm  Result Value Ref Range   dRVVT Confirm 1.3 (H) 0.8 - 1.2 ratio      Assessment & Plan:   Problem List Items Addressed This Visit      Cardiovascular and Mediastinum   Hypertension associated with diabetes (HCC)    Chronic, ongoing. Recent office BP slightly above goal, recommend he continue Losartan daily for both kidney protection in diabetes and BP control.  Check BP at home a few days a week and document for provider + focus on DASH diet.  Will obtain outpatient BMP.  Plan to return to office in 3 months.      Relevant Medications   XARELTO 20 MG TABS tablet   Other Relevant Orders   Basic metabolic panel   Bayer DCA Hb A1c Waived   Right leg DVT ([place])    DVT to  right leg, history of DVT to left leg in past, unprovoked.  Continue Xarelto 20 MG daily and collaboration with hematology, he is aware to follow-up with them.  On review of hematology note to continue Xarelto until November 2021.  Return to office in 3 months, sooner if any worsening symptoms.      Relevant Medications   XARELTO 20 MG TABS tablet     Endocrine   Hyperlipidemia associated with type 2 diabetes mellitus (HCC)    Chronic, ongoing.   Continue Crestor at this time, discussed at length.  Check lipid panel outpatient and adjust dose as needed.      Relevant Orders   Bayer DCA Hb A1c Waived   Lipid Panel w/o Chol/HDL Ratio   Diabetes mellitus associated with hormonal etiology ([place]) - Primary    Chronic, ongoing.  Continue Metformin daily and recommend he check BS at least once a day and document for provider.  Will plan on outpatient A1C and adjust regimen as needed.  Focus on diabetic diet at home.  Return in 3 months.      Relevant Orders   Bayer DCA Hb A1c Waived     Other   Generalized anxiety disorder (Chronic)    Chronic, ongoing.  Has been on benzo long term, recommended trial of SSRI which he does not wish to try at this time.  Discussed at length risks of long term benzo use.  He is aware of risks.  Refill sent #90 plus 2 refills.  Will have return to office in 3 months.  UDS up to date.      Relevant Medications   clonazePAM (KLONOPIN) 0.5 MG tablet (Start on 12/27/2019)   Nicotine dependence, cigarettes, uncomplicated    I have recommended complete cessation of tobacco use. I have discussed various options available for assistance with tobacco cessation including over the counter methods (Nicotine gum, patch and lozenges). We also discussed prescription options (Chantix, Nicotine Inhaler / Nasal Spray). The patient is not interested in pursuing any prescription tobacco cessation options at this time.       Depression, major, single episode, moderate (HCC)    Chronic, ongoing.  Would benefit from SSRI, such as Zoloft, but refuses this.  Refills sent on Klonopin, refer to anxiety plan.      Long-term current use of benzodiazepine    Chronic, ongoing.  Has been on benzo long term, recommended trial SSRI which he refuses at this time.  Discussed at length risks of long term benzo use.  He is aware of risks.  Refill sent #90 plus 2 refills.  UDS up to date -- obtain contract next visit.      Dizziness     Ongoing for years, ?BPPV.  Script for Meclizine sent to use as needed only.  Recommend he check BS more consistently as well to ensure not underlying cause of dizziness.  Will refer to ENT if worsening.       Other Visit Diagnoses    B12 deficiency       History of, check B12 level today.   Relevant Orders   Vitamin B12      I discussed the assessment and treatment plan with the patient. The patient was provided an opportunity to ask questions and all were answered. The patient agreed with the plan and demonstrated an understanding of the instructions.   The patient was advised to call back or seek an in-person evaluation if the symptoms worsen or if the condition fails to  improve as anticipated.   I provided 21+ minutes of time during this encounter.  Follow up plan: Return in about 3 months (around 03/23/2020) for T2DM, HTN/HLD, MOOD -- needs controlled subs contract, DVT.

## 2019-12-22 NOTE — Assessment & Plan Note (Signed)
Chronic, ongoing.  Continue Metformin daily and recommend he check BS at least once a day and document for provider.  Will plan on outpatient A1C and adjust regimen as needed.  Focus on diabetic diet at home.  Return in 3 months.

## 2019-12-22 NOTE — Assessment & Plan Note (Signed)
Chronic, ongoing.  Continue Crestor at this time, discussed at length.  Check lipid panel outpatient and adjust dose as needed.

## 2019-12-22 NOTE — Assessment & Plan Note (Signed)
Ongoing for years, ?BPPV.  Script for Meclizine sent to use as needed only.  Recommend he check BS more consistently as well to ensure not underlying cause of dizziness.  Will refer to ENT if worsening.

## 2019-12-22 NOTE — Assessment & Plan Note (Signed)
Chronic, ongoing. Recent office BP slightly above goal, recommend he continue Losartan daily for both kidney protection in diabetes and BP control.  Check BP at home a few days a week and document for provider + focus on DASH diet.  Will obtain outpatient BMP.  Plan to return to office in 3 months.

## 2019-12-22 NOTE — Telephone Encounter (Signed)
lvm to make this apt. For labs and f.u

## 2019-12-24 ENCOUNTER — Ambulatory Visit: Payer: 59 | Admitting: Nurse Practitioner

## 2019-12-24 NOTE — Telephone Encounter (Signed)
Unable to lvm to make both apts.

## 2020-01-04 ENCOUNTER — Other Ambulatory Visit: Payer: 59

## 2020-01-11 ENCOUNTER — Ambulatory Visit: Payer: 59 | Admitting: Nurse Practitioner

## 2020-01-11 ENCOUNTER — Other Ambulatory Visit: Payer: Self-pay

## 2020-01-11 ENCOUNTER — Other Ambulatory Visit: Payer: 59

## 2020-01-11 DIAGNOSIS — E1169 Type 2 diabetes mellitus with other specified complication: Secondary | ICD-10-CM

## 2020-01-11 DIAGNOSIS — E785 Hyperlipidemia, unspecified: Secondary | ICD-10-CM

## 2020-01-11 DIAGNOSIS — E538 Deficiency of other specified B group vitamins: Secondary | ICD-10-CM

## 2020-01-11 DIAGNOSIS — I152 Hypertension secondary to endocrine disorders: Secondary | ICD-10-CM

## 2020-01-11 LAB — BAYER DCA HB A1C WAIVED: HB A1C (BAYER DCA - WAIVED): 6.6 % (ref <7.0)

## 2020-01-11 NOTE — Progress Notes (Signed)
Contacted via MyChart  So far A1C looks MUCH better at 6.6, had been 7.8%.  Continue your Metformin as it is helping.  Remainder of labs will return tomorrow and I will try to get them to you as soon as possible.  I am headed out of country to see my mom, emergency as she is not well.  Keep being awesome!!  Thank you for allowing me to participate in your care. Kindest regards, Kytzia Gienger

## 2020-01-12 LAB — LIPID PANEL W/O CHOL/HDL RATIO
Cholesterol, Total: 225 mg/dL — ABNORMAL HIGH (ref 100–199)
HDL: 71 mg/dL (ref >39)
LDL Chol Calc (NIH): 134 mg/dL — ABNORMAL HIGH (ref 0–99)
Triglycerides: 116 mg/dL (ref 0–149)
VLDL Cholesterol Cal: 20 mg/dL (ref 5–40)

## 2020-01-12 LAB — BASIC METABOLIC PANEL
BUN/Creatinine Ratio: 11 (ref 9–20)
BUN: 8 mg/dL (ref 6–24)
CO2: 26 mmol/L (ref 20–29)
Calcium: 9.1 mg/dL (ref 8.7–10.2)
Chloride: 96 mmol/L (ref 96–106)
Creatinine, Ser: 0.75 mg/dL — ABNORMAL LOW (ref 0.76–1.27)
GFR calc Af Amer: 120 mL/min/{1.73_m2} (ref >59)
GFR calc non Af Amer: 104 mL/min/{1.73_m2} (ref >59)
Glucose: 194 mg/dL — ABNORMAL HIGH (ref 65–99)
Potassium: 4.6 mmol/L (ref 3.5–5.2)
Sodium: 134 mmol/L (ref 134–144)

## 2020-01-12 LAB — VITAMIN B12: Vitamin B-12: 295 pg/mL (ref 232–1245)

## 2020-03-17 ENCOUNTER — Ambulatory Visit: Payer: 59 | Admitting: Nurse Practitioner

## 2020-04-14 ENCOUNTER — Other Ambulatory Visit: Payer: Self-pay | Admitting: Nurse Practitioner

## 2020-04-14 DIAGNOSIS — F411 Generalized anxiety disorder: Secondary | ICD-10-CM

## 2020-04-14 MED ORDER — CLONAZEPAM 0.5 MG PO TABS: ORAL_TABLET | ORAL | 0 refills | Status: DC

## 2020-04-14 NOTE — Telephone Encounter (Signed)
Requested medication (s) are due for refill today - yes  Requested medication (s) are on the active medication list -yes  Future visit scheduled -yes  Last refill: 03/20/20  Notes to clinic: Request non delegated rx  Requested Prescriptions  Pending Prescriptions Disp Refills   clonazePAM (KLONOPIN) 0.5 MG tablet [Pharmacy Med Name: CLONAZEPAM 0.5 MG TABLET] 90 tablet 0    Sig: Take 1 tablet (0.5 mg total) by mouth 3 (three) times daily as needed for anxiety.      Not Delegated - Psychiatry:  Anxiolytics/Hypnotics Failed - 04/14/2020  1:46 PM      Failed - This refill cannot be delegated      Failed - Urine Drug Screen completed in last 360 days      Passed - Valid encounter within last 6 months    Recent Outpatient Visits           3 months ago Diabetes mellitus associated with hormonal etiology ([place])   [place], Jolene T, NP   7 months ago Acute deep vein thrombosis (DVT) of femoral vein of right lower extremity ([place])   [place], Jolene T, NP   7 months ago Right leg pain   [place], [place] T, NP   7 months ago Family history of genetic disorder   [employer], [place] T, NP   10 months ago Diabetes mellitus associated with hormonal etiology ([place])   [place], Barbaraann Faster, NP       Future Appointments             In 1 week Cannady, Barbaraann Faster, NP [facility]   In 1 month Finnegan, Kathlene November, [facility]                 Requested Prescriptions  Pending Prescriptions Disp Refills   clonazePAM (KLONOPIN) 0.5 MG tablet [Pharmacy Med Name: CLONAZEPAM 0.5 MG TABLET] 90 tablet 0    Sig: Take 1 tablet (0.5 mg total) by mouth 3 (three) times daily as needed for anxiety.      Not Delegated - Psychiatry:  Anxiolytics/Hypnotics Failed - 04/14/2020  1:46 PM      Failed - This refill cannot be delegated       Failed - Urine Drug Screen completed in last 360 days      Passed - Valid encounter within last 6 months    Recent Outpatient Visits           3 months ago Diabetes mellitus associated with hormonal etiology ([place])   [place], Jolene T, NP   7 months ago Acute deep vein thrombosis (DVT) of femoral vein of right lower extremity ([place])   [place], Jolene T, NP   7 months ago Right leg pain   [place], [place] T, NP   7 months ago Family history of genetic disorder   [employer], [place] T, NP   10 months ago Diabetes mellitus associated with hormonal etiology ([place])   [place], Barbaraann Faster, NP       Future Appointments             In 1 week Cannady, Barbaraann Faster, NP [facility]   In 1 month Finnegan, Kathlene November, MD [facility]

## 2020-04-14 NOTE — Telephone Encounter (Signed)
FYI

## 2020-04-14 NOTE — Telephone Encounter (Signed)
Medication Refill - Medication: clonazePAM (KLONOPIN) 0.5 MG tablet    Has the patient contacted their pharmacy? Yes.   (Agent: If no, request that the patient contact the pharmacy for the refill.) (Agent: If yes, when and what did the pharmacy advise?)  Preferred Pharmacy (with phone number or street name):  [facility] [place], [address]  [place] [address]  Phone: [phone] Fax: [phone]     Agent: Please be advised that RX refills may take up to 3 business days. We ask that you follow-up with your pharmacy.

## 2020-04-14 NOTE — Telephone Encounter (Signed)
Requested medication (s) are due for refill today:   Provider to determine  Requested medication (s) are on the active medication list:   Yes  Future visit scheduled:   Yes in 1 wk.   Last ordered: Non delegated refill   Requested Prescriptions  Pending Prescriptions Disp Refills   clonazePAM (KLONOPIN) 0.5 MG tablet 90 tablet 3    Sig: Take 1 tablet (0.5 mg total) by mouth 3 (three) times daily as needed for anxiety.      Not Delegated - Psychiatry:  Anxiolytics/Hypnotics Failed - 04/14/2020  1:43 PM      Failed - This refill cannot be delegated      Failed - Urine Drug Screen completed in last 360 days      Passed - Valid encounter within last 6 months    Recent Outpatient Visits           3 months ago Diabetes mellitus associated with hormonal etiology ([place])   [place], Jolene T, NP   7 months ago Acute deep vein thrombosis (DVT) of femoral vein of right lower extremity ([place])   [place], Jolene T, NP   7 months ago Right leg pain   [place], [place] T, NP   7 months ago Family history of genetic disorder   [employer], [place] T, NP   10 months ago Diabetes mellitus associated with hormonal etiology ([place])   [place], Barbaraann Faster, NP       Future Appointments             In 1 week Cannady, Barbaraann Faster, NP [facility]   In 1 month Finnegan, Kathlene November, MD [facility]

## 2020-04-22 ENCOUNTER — Encounter: Payer: Self-pay | Admitting: Nurse Practitioner

## 2020-04-22 DIAGNOSIS — G114 Hereditary spastic paraplegia: Secondary | ICD-10-CM | POA: Insufficient documentation

## 2020-04-24 ENCOUNTER — Ambulatory Visit: Payer: 59 | Admitting: Nurse Practitioner

## 2020-04-24 DIAGNOSIS — E538 Deficiency of other specified B group vitamins: Secondary | ICD-10-CM | POA: Insufficient documentation

## 2020-04-26 ENCOUNTER — Other Ambulatory Visit: Payer: Self-pay

## 2020-04-26 ENCOUNTER — Encounter: Payer: Self-pay | Admitting: Nurse Practitioner

## 2020-04-26 ENCOUNTER — Ambulatory Visit (INDEPENDENT_AMBULATORY_CARE_PROVIDER_SITE_OTHER): Payer: 59 | Admitting: Nurse Practitioner

## 2020-04-26 VITALS — BP 126/79 | HR 89 | Temp 98.1°F | Ht 72.84 in | Wt 230.6 lb

## 2020-04-26 DIAGNOSIS — Z79899 Other long term (current) drug therapy: Secondary | ICD-10-CM

## 2020-04-26 DIAGNOSIS — E785 Hyperlipidemia, unspecified: Secondary | ICD-10-CM

## 2020-04-26 DIAGNOSIS — Z125 Encounter for screening for malignant neoplasm of prostate: Secondary | ICD-10-CM

## 2020-04-26 DIAGNOSIS — F411 Generalized anxiety disorder: Secondary | ICD-10-CM

## 2020-04-26 DIAGNOSIS — I152 Hypertension secondary to endocrine disorders: Secondary | ICD-10-CM

## 2020-04-26 DIAGNOSIS — F17210 Nicotine dependence, cigarettes, uncomplicated: Secondary | ICD-10-CM

## 2020-04-26 DIAGNOSIS — G114 Hereditary spastic paraplegia: Secondary | ICD-10-CM

## 2020-04-26 DIAGNOSIS — Z1159 Encounter for screening for other viral diseases: Secondary | ICD-10-CM

## 2020-04-26 DIAGNOSIS — I82411 Acute embolism and thrombosis of right femoral vein: Secondary | ICD-10-CM

## 2020-04-26 DIAGNOSIS — F321 Major depressive disorder, single episode, moderate: Secondary | ICD-10-CM

## 2020-04-26 DIAGNOSIS — E538 Deficiency of other specified B group vitamins: Secondary | ICD-10-CM

## 2020-04-26 DIAGNOSIS — E1159 Type 2 diabetes mellitus with other circulatory complications: Secondary | ICD-10-CM

## 2020-04-26 DIAGNOSIS — E1169 Type 2 diabetes mellitus with other specified complication: Secondary | ICD-10-CM

## 2020-04-26 LAB — BAYER DCA HB A1C WAIVED: HB A1C (BAYER DCA - WAIVED): 9.1 % — ABNORMAL HIGH (ref <7.0)

## 2020-04-26 MED ORDER — CLONAZEPAM 0.5 MG PO TABS
ORAL_TABLET | ORAL | 2 refills | Status: DC
Start: 2020-05-15 — End: 2020-07-11

## 2020-04-26 MED ORDER — VITAMIN B-12 1000 MCG PO TABS: 1000.0000 ug | ORAL_TABLET | Freq: Every day | ORAL | 4 refills | Status: DC

## 2020-04-26 MED ORDER — GABAPENTIN 600 MG PO TABS: 300.0000 mg | ORAL_TABLET | Freq: Every day | ORAL | 4 refills | Status: DC

## 2020-04-26 MED ORDER — BACLOFEN 10 MG PO TABS: 10.0000 mg | ORAL_TABLET | ORAL | 4 refills | Status: DC | PRN

## 2020-04-26 MED ORDER — METFORMIN HCL ER 500 MG PO TB24
500.0000 mg | ORAL_TABLET | Freq: Every day | ORAL | 3 refills | Status: DC
Start: 2020-04-26 — End: 2020-05-30

## 2020-04-26 NOTE — Assessment & Plan Note (Signed)
Continue collaboration with [facility].  Baclofen for discomfort as needed.  Add on Gabapentin 300 MG QHS for discomfort -- this may also benefit sleep.

## 2020-04-26 NOTE — Assessment & Plan Note (Signed)
Chronic, ongoing.  A1c upward trend to 9.1% today, he is not always adherent to regimen.  Continue Metformin -- have recommended he try taking 500 MG BID for now and recommend he check BS at least once a day and document for provider.  Focus on diabetic diet at home, discussed with him -- eats lot os oranges and frosted flakes.  He refuses to start alternate medication like Invokana or GLP1.  Return in 3 months.

## 2020-04-26 NOTE — Assessment & Plan Note (Signed)
Chronic, ongoing.  Has been on benzo long term, recommended trial of SSRI which he does not wish to try at this time.  Discussed at length risks of long term benzo use.  He is aware of risks.  Refill sent #90 plus 2 refills.  Will have return to office in 3 months.  UDS up to date. 

## 2020-04-26 NOTE — Assessment & Plan Note (Signed)
DVT to right leg, history of DVT to left leg in past, unprovoked.  On review of hematology note to continue Xarelto until November 2021 -- have advised he stop this and to schedule appointment with hematology for 8 weeks after stopping.  Return to office in 3 months, sooner if any worsening symptoms.

## 2020-04-26 NOTE — Progress Notes (Signed)
BP 126/79   Pulse 89   Temp 98.1 F (36.7 C) (Oral)   Ht 6' 0.84" (1.85 m)   Wt 230 lb 9.6 oz (104.6 kg)   SpO2 97%   BMI 30.56 kg/m    Subjective:    Patient ID: Harold Tate, male    DOB: [birth year], 54 y.o.   MRN: [id number]  HPI: Harold Tate is a 54 y.o. male  Chief Complaint  Patient presents with  . Diabetes  . Leg Pain    B/L leg pain   DIABETES Last A1C 6.6% in September, on Metformin XR 1000 MG daily -- but is taking only one tablet a day (500 MG) due to diarrhea.  Has been working on diet changes. Hypoglycemic episodes:no Polydipsia/polyuria:no Visual disturbance:no Chest pain:no Paresthesias:no Glucose Monitoring:no Accucheck frequency: Not Checking Fasting glucose: Post prandial: Evening: Before meals: Taking Insulin?:no Long acting insulin: Short acting insulin: Blood Pressure Monitoring:not checking Retinal Examination:Not up to Date Foot Exam:Not up to Date Pneumovax:Not up to Date Influenza:Not up to Date Aspirin:no  HYPERTENSION / HYPERLIPIDEMIA Taking Losartan 25 MG daily, Crestor 5 MG daily, and Xarelto for recent DVT 20 MG daily.  Was seen by hematology due to history of DVT with recurrent DVT recently -- he is to stop taking Xarelto in November after he sees hematology for follow-up -- is to see hematology in 8 weeks after stopping. Satisfied with current treatment?yes Duration of hypertension:chronic BP monitoring frequency:not checking BP range:  BP medication side effects:no Duration of hyperlipidemia:chronic Cholesterol medication side effects:no Cholesterol supplements: none Medication compliance:good compliance Aspirin:no Recent stressors:no Recurrent headaches:no Visual changes:no Palpitations:no Dyspnea:no Chest pain:no Lower extremity edema:no Dizzy/lightheaded:no  ANXIETY/STRESS Continues on  Clonazepam 0.5 MG TID ordered by previous PCP. Has been on this for several years, used to be Xanax but was changed -- he endorses at times he takes this four times a day -- advised him not to do this.Pt is aware of risks ofbenzomedication use to include increased sedation, respiratory suppression, falls, extrapyramidal movements, dependence and cardiovascular events. Ptwould like to continue treatment as benefit determined to outweigh risk.Discussed current research with him. He reports in past when he tried to quit taking Xanax 1 MG TID, it became worse and then he broke out in hives.   Has tried Wellbutrin and Prozac in past without success. Wellbutrin caused weird thoughts.  On review of PDMP his last Klonopin fill was 04/14/20, no other recent controlled substance fills.   Duration:stable Anxious mood:no Excessive worrying:no Irritability:no Sweating:no Nausea:no Palpitations:no Hyperventilation:no Panic attacks:no Agoraphobia:no Obscessions/compulsions:no Depressed mood:no Depression screen [facility] 2/9 04/26/2020 12/22/2019 06/10/2019 03/09/2019 05/05/2018  Decreased Interest 2 0 0 0 0  Down, Depressed, Hopeless 2 0 0 0 0  PHQ - 2 Score 4 0 0 0 0  Altered sleeping 3 1 0 0 0  Tired, decreased energy 3 0 0 0 0  Change in appetite 0 0 0 0 1  Feeling bad or failure about yourself  0 0 0 0 0  Trouble concentrating 1 0 0 0 0  Moving slowly or fidgety/restless 0 2 0 0 0  Suicidal thoughts 0 0 0 0 0  PHQ-9 Score 11 3 0 0 1  Difficult doing work/chores - - Not difficult at all Not difficult at all Not difficult at all   SPASTIC PARAPLEGIA, HEREDITARY: Currently followed at [facility] by Dr. Iran Planas.  Last visit 02/22/20.  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  This causes discomfort for him, particularly in legs.  Takes Baclofen at this time as needed.  Relevant past medical, surgical, family and social history reviewed and  updated as indicated. Interim medical history since our last visit reviewed. Allergies and medications reviewed and updated.  Review of Systems  Constitutional: Negative for activity change, diaphoresis, fatigue and fever.  Respiratory: Negative for cough, chest tightness, shortness of breath and wheezing.   Cardiovascular: Negative for chest pain, palpitations and leg swelling.  Gastrointestinal: Negative.   Endocrine: Negative for polydipsia, polyphagia and polyuria.  Neurological: Negative.   Psychiatric/Behavioral: Negative for decreased concentration, self-injury, sleep disturbance and suicidal ideas. The patient is not nervous/anxious.     Per HPI unless specifically indicated above     Objective:    BP 126/79   Pulse 89   Temp 98.1 F (36.7 C) (Oral)   Ht 6' 0.84" (1.85 m)   Wt 230 lb 9.6 oz (104.6 kg)   SpO2 97%   BMI 30.56 kg/m   Wt Readings from Last 3 Encounters:  04/26/20 230 lb 9.6 oz (104.6 kg)  09/20/19 233 lb 4.8 oz (105.8 kg)  09/13/19 238 lb 6.4 oz (108.1 kg)    Physical Exam Vitals and nursing note reviewed.  Constitutional:      General: He is awake. He is not in acute distress.    Appearance: He is well-developed. He is obese. He is not ill-appearing.  HENT:     Head: Normocephalic and atraumatic.     Right Ear: Hearing normal. No drainage.     Left Ear: Hearing normal. No drainage.  Eyes:     General: Lids are normal.        Right eye: No discharge.        Left eye: No discharge.     Conjunctiva/sclera: Conjunctivae normal.     Pupils: Pupils are equal, round, and reactive to light.  Neck:     Thyroid: No thyromegaly.     Vascular: No carotid bruit.  Cardiovascular:     Rate and Rhythm: Normal rate and regular rhythm.     Heart sounds: Normal heart sounds, S1 normal and S2 normal. No murmur heard. No gallop.   Pulmonary:     Effort: Pulmonary effort is normal. No accessory muscle usage or respiratory distress.     Breath sounds: Normal  breath sounds.  Abdominal:     General: Bowel sounds are normal.     Palpations: Abdomen is soft.  Musculoskeletal:        General: Normal range of motion.     Cervical back: Normal range of motion and neck supple.     Right lower leg: No edema.     Left lower leg: No edema.  Skin:    General: Skin is warm and dry.     Capillary Refill: Capillary refill takes less than 2 seconds.     Findings: No rash.  Neurological:     Mental Status: He is alert and oriented to person, place, and time.     Deep Tendon Reflexes: Reflexes are normal and symmetric.  Psychiatric:        Attention and Perception: Attention normal.        Mood and Affect: Mood normal.        Speech: Speech normal.        Behavior: Behavior normal. Behavior is cooperative.        Thought Content: Thought content normal.    Results for  orders placed or performed in visit on 01/11/20  Vitamin B12  Result Value Ref Range   Vitamin B-12 295 232 - 1,245 pg/mL  Lipid Panel w/o Chol/HDL Ratio  Result Value Ref Range   Cholesterol, Total 225 (H) 100 - 199 mg/dL   Triglycerides 116 0 - 149 mg/dL   HDL 71 >39 mg/dL   VLDL Cholesterol Cal 20 5 - 40 mg/dL   LDL Chol Calc (NIH) 134 (H) 0 - 99 mg/dL  Bayer DCA Hb A1c Waived  Result Value Ref Range   HB A1C (BAYER DCA - WAIVED) 6.6 <7.3 %  Basic metabolic panel  Result Value Ref Range   Glucose 194 (H) 65 - 99 mg/dL   BUN 8 6 - 24 mg/dL   Creatinine, Ser 0.75 (L) 0.76 - 1.27 mg/dL   GFR calc non Af Amer 104 >59 mL/min/1.73   GFR calc Af Amer 120 >59 mL/min/1.73   BUN/Creatinine Ratio 11 9 - 20   Sodium 134 134 - 144 mmol/L   Potassium 4.6 3.5 - 5.2 mmol/L   Chloride 96 96 - 106 mmol/L   CO2 26 20 - 29 mmol/L   Calcium 9.1 8.7 - 10.2 mg/dL      Assessment & Plan:   Problem List Items Addressed This Visit      Cardiovascular and Mediastinum   Hypertension associated with diabetes ([place]) - Primary    Chronic, ongoing. BP at goal today. Recommend he continue  Losartan daily for both kidney protection in diabetes and BP control.  Check BP at home a few days a week and document for provider + focus on DASH diet.  BMP today.  Plan to return to office in 3 months.      Relevant Medications   metFORMIN (GLUCOPHAGE-XR) 500 MG 24 hr tablet   Other Relevant Orders   TSH   Bayer DCA Hb A1c Waived   Basic metabolic panel   Right leg DVT (HCC)    DVT to right leg, history of DVT to left leg in past, unprovoked.  On review of hematology note to continue Xarelto until November 2021 -- have advised he stop this and to schedule appointment with hematology for 8 weeks after stopping.  Return to office in 3 months, sooner if any worsening symptoms.        Endocrine   Hyperlipidemia associated with type 2 diabetes mellitus (HCC)    Chronic, ongoing.  Continue Crestor at this time, discussed at length.  Lipid panel today.      Relevant Medications   metFORMIN (GLUCOPHAGE-XR) 500 MG 24 hr tablet   Other Relevant Orders   Bayer DCA Hb A1c Waived   Lipid Panel w/o Chol/HDL Ratio   Diabetes mellitus associated with hormonal etiology (HCC)    Chronic, ongoing.  A1c upward trend to 9.1% today, he is not always adherent to regimen.  Continue Metformin -- have recommended he try taking 500 MG BID for now and recommend he check BS at least once a day and document for provider.  Focus on diabetic diet at home, discussed with him -- eats lot os oranges and frosted flakes.  He refuses to start alternate medication like Invokana or GLP1.  Return in 3 months.      Relevant Medications   metFORMIN (GLUCOPHAGE-XR) 500 MG 24 hr tablet   Other Relevant Orders   Bayer DCA Hb A1c Waived     Nervous and Auditory   Hereditary spastic paraparesis (HCC)    Continue  collaboration with [facility].  Baclofen for discomfort as needed.  Add on Gabapentin 300 MG QHS for discomfort -- this may also benefit sleep.        Other   Generalized anxiety disorder (Chronic)    Chronic,  ongoing.  Has been on benzo long term, recommended trial of SSRI which he does not wish to try at this time.  Discussed at length risks of long term benzo use.  He is aware of risks.  Refill sent #90 plus 2 refills.  Will have return to office in 3 months.  UDS up to date.      Relevant Medications   clonazePAM (KLONOPIN) 0.5 MG tablet (Start on 05/15/2020)   Nicotine dependence, cigarettes, uncomplicated    I have recommended complete cessation of tobacco use. I have discussed various options available for assistance with tobacco cessation including over the counter methods (Nicotine gum, patch and lozenges). We also discussed prescription options (Chantix, Nicotine Inhaler / Nasal Spray). The patient is not interested in pursuing any prescription tobacco cessation options at this time.       Depression, major, single episode, moderate (HCC)    Chronic, ongoing.  Would benefit from SSRI, such as Zoloft, but refuses this.  Refills sent on Klonopin, refer to anxiety plan.  Have recommended psychiatry visit, he refuses.      Long-term current use of benzodiazepine    Chronic, ongoing.  Has been on benzo long term, recommended trial SSRI which he refuses at this time.  Discussed at length risks of long term benzo use.  He is aware of risks.  Refill sent #90 plus 2 refills.  UDS up to date.  Will refer to psychiatry if any increased doses requested, he refuses visit with them at this time.      B12 deficiency    Noted on past labs and endorses some neuropathy pain, have advised him to start supplement and ordered to pharmacy.  Check level today.      Relevant Orders   Vitamin B12    Other Visit Diagnoses    Need for hepatitis C screening test       Hep C screening today   Relevant Orders   Hepatitis C antibody   Prostate cancer screening       PSA on labs today   Relevant Orders   PSA       Follow up plan: Return in about 3 months (around 07/25/2020) for T2DM, HTN/HLD, MOOD -- meet  new PCP.

## 2020-04-26 NOTE — Assessment & Plan Note (Signed)
Chronic, ongoing.  Has been on benzo long term, recommended trial SSRI which he refuses at this time.  Discussed at length risks of long term benzo use.  He is aware of risks.  Refill sent #90 plus 2 refills.  UDS up to date.  Will refer to psychiatry if any increased doses requested, he refuses visit with them at this time.

## 2020-04-26 NOTE — Assessment & Plan Note (Signed)
Chronic, ongoing.  Continue Crestor at this time, discussed at length.  Lipid panel today. 

## 2020-04-26 NOTE — Assessment & Plan Note (Signed)
Chronic, ongoing. BP at goal today. Recommend he continue Losartan daily for both kidney protection in diabetes and BP control.  Check BP at home a few days a week and document for provider + focus on DASH diet.  BMP today.  Plan to return to office in 3 months.

## 2020-04-26 NOTE — Assessment & Plan Note (Signed)
I have recommended complete cessation of tobacco use. I have discussed various options available for assistance with tobacco cessation including over the counter methods (Nicotine gum, patch and lozenges). We also discussed prescription options (Chantix, Nicotine Inhaler / Nasal Spray). The patient is not interested in pursuing any prescription tobacco cessation options at this time.  

## 2020-04-26 NOTE — Patient Instructions (Addendum)
CALL DR. FINNEGAN AND SCHEDULE --- STOP XARELTO AND TO SEE HIM 8 WEEKS AFTER STOPPING.   Deep Vein Thrombosis  Deep vein thrombosis (DVT) is a condition in which a blood clot forms in a deep vein, such as a lower leg, thigh, or arm vein. A clot is blood that has thickened into a gel or solid. This condition is dangerous. It can lead to serious and even life-threatening complications if the clot travels to the lungs and causes a blockage (pulmonary embolism). It can also damage veins in the leg. This can result in leg pain, swelling, discoloration, and sores (post-thrombotic syndrome). What are the causes? This condition may be caused by:  A slowdown of blood flow.  Damage to a vein.  A condition that causes blood to clot more easily, such as an inherited clotting disorder. What increases the risk? The following factors may make you more likely to develop this condition:  Being overweight.  Being older, especially over age 45.  Sitting or lying down for more than four hours.  Being in the hospital.  Lack of physical activity (sedentary lifestyle).  Pregnancy, being in childbirth, or having recently given birth.  Taking medicines that contain estrogen, such as medicines to prevent pregnancy.  Smoking.  A history of any of the following: ? Blood clots or a blood clotting disease. ? Peripheral vascular disease. ? Inflammatory bowel disease. ? Cancer. ? Heart disease. ? Genetic conditions that affect how your blood clots, such as Factor V Leiden mutation. ? Neurological diseases that affect your legs (leg paresis). ? A recent injury, such as a car accident. ? Major or lengthy surgery. ? A central line placed inside a large vein. What are the signs or symptoms? Symptoms of this condition include:  Swelling, pain, or tenderness in an arm or leg.  Warmth, redness, or discoloration in an arm or leg. If the clot is in your leg, symptoms may be more noticeable or worse when you  stand or walk. Some people may not develop any symptoms. How is this diagnosed? This condition is diagnosed with:  A medical history and physical exam.  Tests, such as: ? Blood tests. These are done to check how well your blood clots. ? Ultrasound. This is done to check for clots. ? Venogram. For this test, contrast dye is injected into a vein and X-rays are taken to check for any clots. How is this treated? Treatment for this condition depends on:  The cause of your DVT.  Your risk for bleeding or developing more clots.  Any other medical conditions that you have. Treatment may include:  Taking a blood thinner (anticoagulant). This type of medicine prevents clots from forming. It may be taken by mouth, injected under the skin, or injected through an IV (catheter).  Injecting clot-dissolving medicines into the affected vein (catheter-directed thrombolysis).  Having surgery. Surgery may be done to: ? Remove the clot. ? Place a filter in a large vein to catch blood clots before they reach the lungs. Some treatments may be continued for up to six months. Follow these instructions at home: If you are taking blood thinners:  Take the medicine exactly as told by your health care provider. Some blood thinners need to be taken at the same time every day. Do not skip a dose.  Talk with your health care provider before you take any medicines that contain aspirin or NSAIDs. These medicines increase your risk for dangerous bleeding.  Ask your health care provider about  foods and drugs that could change the way the medicine works (may interact). Avoid those things if your health care provider tells you to do so.  Blood thinners can cause easy bruising and may make it difficult to stop bleeding. Because of this: ? Be very careful when using knives, scissors, or other sharp objects. ? Use an electric razor instead of a blade. ? Avoid activities that could cause injury or bruising, and follow  instructions about how to prevent falls.  Wear a medical alert bracelet or carry a card that lists what medicines you take. General instructions  Take over-the-counter and prescription medicines only as told by your health care provider.  Return to your normal activities as told by your health care provider. Ask your health care provider what activities are safe for you.  Wear compression stockings if recommended by your health care provider.  Keep all follow-up visits as told by your health care provider. This is important. How is this prevented? To lower your risk of developing this condition again:  For 30 or more minutes every day, do an activity that: ? Involves moving your arms and legs. ? Increases your heart rate.  When traveling for longer than four hours: ? Exercise your arms and legs every hour. ? Drink plenty of water. ? Avoid drinking alcohol.  Avoid sitting or lying for a long time without moving your legs.  If you have surgery or you are hospitalized, ask about ways to prevent blood clots. These may include taking frequent walks or using anticoagulants.  Stay at a healthy weight.  If you are a woman who is older than age 41, avoid unnecessary use of medicines that contain estrogen, such as some birth control pills.  Do not use any products that contain nicotine or tobacco, such as cigarettes and e-cigarettes. This is especially important if you take estrogen medicines. If you need help quitting, ask your health care provider. Contact a health care provider if:  You miss a dose of your blood thinner.  Your menstrual period is heavier than usual.  You have unusual bruising. Get help right away if:  You have: ? New or increased pain, swelling, or redness in an arm or leg. ? Numbness or tingling in an arm or leg. ? Shortness of breath. ? Chest pain. ? A rapid or irregular heartbeat. ? A severe headache or confusion. ? A cut that will not stop  bleeding.  There is blood in your vomit, stool, or urine.  You have a serious fall or accident, or you hit your head.  You feel light-headed or dizzy.  You cough up blood. These symptoms may represent a serious problem that is an emergency. Do not wait to see if the symptoms will go away. Get medical help right away. Call your local emergency services (911 in the [place] Do not drive yourself to the hospital. Summary  Deep vein thrombosis (DVT) is a condition in which a blood clot forms in a deep vein, such as a lower leg, thigh, or arm vein.  Symptoms can include swelling, warmth, pain, and redness in your leg or arm.  This condition may be treated with a blood thinner (anticoagulant medicine), medicine that is injected to dissolve blood clots,compression stockings, or surgery.  If you are prescribed blood thinners, take them exactly as told. This information is not intended to replace advice given to you by your health care provider. Make sure you discuss any questions you have with your health care  provider. Document Revised: 03/28/2017 Document Reviewed: 09/13/2016 Elsevier Patient Education  2020 [employer]   Diabetes Mellitus and Nutrition, Adult When you have diabetes (diabetes mellitus), it is very important to have healthy eating habits because your blood sugar (glucose) levels are greatly affected by what you eat and drink. Eating healthy foods in the appropriate amounts, at about the same times every day, can help you:  Control your blood glucose.  Lower your risk of heart disease.  Improve your blood pressure.  Reach or maintain a healthy weight. Every person with diabetes is different, and each person has different needs for a meal plan. Your health care provider may recommend that you work with a diet and nutrition specialist (dietitian) to make a meal plan that is best for you. Your meal plan may vary depending on factors such as:  The calories you need.  The  medicines you take.  Your weight.  Your blood glucose, blood pressure, and cholesterol levels.  Your activity level.  Other health conditions you have, such as heart or kidney disease. How do carbohydrates affect me? Carbohydrates, also called carbs, affect your blood glucose level more than any other type of food. Eating carbs naturally raises the amount of glucose in your blood. Carb counting is a method for keeping track of how many carbs you eat. Counting carbs is important to keep your blood glucose at a healthy level, especially if you use insulin or take certain oral diabetes medicines. It is important to know how many carbs you can safely have in each meal. This is different for every person. Your dietitian can help you calculate how many carbs you should have at each meal and for each snack. Foods that contain carbs include:  Bread, cereal, rice, pasta, and crackers.  Potatoes and corn.  Peas, beans, and lentils.  Milk and yogurt.  Fruit and juice.  Desserts, such as cakes, cookies, ice cream, and candy. How does alcohol affect me? Alcohol can cause a sudden decrease in blood glucose (hypoglycemia), especially if you use insulin or take certain oral diabetes medicines. Hypoglycemia can be a life-threatening condition. Symptoms of hypoglycemia (sleepiness, dizziness, and confusion) are similar to symptoms of having too much alcohol. If your health care provider says that alcohol is safe for you, follow these guidelines:  Limit alcohol intake to no more than 1 drink per day for nonpregnant women and 2 drinks per day for men. One drink equals 12 oz of beer, 5 oz of wine, or 1 oz of hard liquor.  Do not drink on an empty stomach.  Keep yourself hydrated with water, diet soda, or unsweetened iced tea.  Keep in mind that regular soda, juice, and other mixers may contain a lot of sugar and must be counted as carbs. What are tips for following this plan?  Reading food  labels  Start by checking the serving size on the "Nutrition Facts" label of packaged foods and drinks. The amount of calories, carbs, fats, and other nutrients listed on the label is based on one serving of the item. Many items contain more than one serving per package.  Check the total grams (g) of carbs in one serving. You can calculate the number of servings of carbs in one serving by dividing the total carbs by 15. For example, if a food has 30 g of total carbs, it would be equal to 2 servings of carbs.  Check the number of grams (g) of saturated and trans fats in one  serving. Choose foods that have low or no amount of these fats.  Check the number of milligrams (mg) of salt (sodium) in one serving. Most people should limit total sodium intake to less than 2,300 mg per day.  Always check the nutrition information of foods labeled as "low-fat" or "nonfat". These foods may be higher in added sugar or refined carbs and should be avoided.  Talk to your dietitian to identify your daily goals for nutrients listed on the label. Shopping  Avoid buying canned, premade, or processed foods. These foods tend to be high in fat, sodium, and added sugar.  Shop around the outside edge of the grocery store. This includes fresh fruits and vegetables, bulk grains, fresh meats, and fresh dairy. Cooking  Use low-heat cooking methods, such as baking, instead of high-heat cooking methods like deep frying.  Cook using healthy oils, such as olive, canola, or sunflower oil.  Avoid cooking with butter, cream, or high-fat meats. Meal planning  Eat meals and snacks regularly, preferably at the same times every day. Avoid going long periods of time without eating.  Eat foods high in fiber, such as fresh fruits, vegetables, beans, and whole grains. Talk to your dietitian about how many servings of carbs you can eat at each meal.  Eat 4-6 ounces (oz) of lean protein each day, such as lean meat, chicken, fish,  eggs, or tofu. One oz of lean protein is equal to: ? 1 oz of meat, chicken, or fish. ? 1 egg. ?  cup of tofu.  Eat some foods each day that contain healthy fats, such as avocado, nuts, seeds, and fish. Lifestyle  Check your blood glucose regularly.  Exercise regularly as told by your health care provider. This may include: ? 150 minutes of moderate-intensity or vigorous-intensity exercise each week. This could be brisk walking, biking, or water aerobics. ? Stretching and doing strength exercises, such as yoga or weightlifting, at least 2 times a week.  Take medicines as told by your health care provider.  Do not use any products that contain nicotine or tobacco, such as cigarettes and e-cigarettes. If you need help quitting, ask your health care provider.  Work with a Social worker or diabetes educator to identify strategies to manage stress and any emotional and social challenges. Questions to ask a health care provider  Do I need to meet with a diabetes educator?  Do I need to meet with a dietitian?  What number can I call if I have questions?  When are the best times to check my blood glucose? Where to find more information:  American Diabetes Association: diabetes.org  Academy of Nutrition and Dietetics: www.eatright.[employer] of Diabetes and Digestive and Kidney Diseases (NIH): [employer] Summary  A healthy meal plan will help you control your blood glucose and maintain a healthy lifestyle.  Working with a diet and nutrition specialist (dietitian) can help you make a meal plan that is best for you.  Keep in mind that carbohydrates (carbs) and alcohol have immediate effects on your blood glucose levels. It is important to count carbs and to use alcohol carefully. This information is not intended to replace advice given to you by your health care provider. Make sure you discuss any questions you have with your health care provider. Document Revised:  03/28/2017 Document Reviewed: 05/20/2016 Elsevier Patient Education  2020 [employer].

## 2020-04-26 NOTE — Assessment & Plan Note (Signed)
Noted on past labs and endorses some neuropathy pain, have advised him to start supplement and ordered to pharmacy.  Check level today.

## 2020-04-26 NOTE — Assessment & Plan Note (Signed)
Chronic, ongoing.  Would benefit from SSRI, such as Zoloft, but refuses this.  Refills sent on Klonopin, refer to anxiety plan.  Have recommended psychiatry visit, he refuses.

## 2020-04-27 LAB — BASIC METABOLIC PANEL
BUN/Creatinine Ratio: 9 (ref 9–20)
BUN: 6 mg/dL (ref 6–24)
CO2: 24 mmol/L (ref 20–29)
Calcium: 9.3 mg/dL (ref 8.7–10.2)
Chloride: 96 mmol/L (ref 96–106)
Creatinine, Ser: 0.68 mg/dL — ABNORMAL LOW (ref 0.76–1.27)
GFR calc Af Amer: 125 mL/min/{1.73_m2} (ref >59)
GFR calc non Af Amer: 108 mL/min/{1.73_m2} (ref >59)
Glucose: 258 mg/dL — ABNORMAL HIGH (ref 65–99)
Potassium: 4 mmol/L (ref 3.5–5.2)
Sodium: 135 mmol/L (ref 134–144)

## 2020-04-27 LAB — HEPATITIS C ANTIBODY: Hep C Virus Ab: <0.1 s/co ratio (ref 0.0–0.9)

## 2020-04-27 LAB — LIPID PANEL W/O CHOL/HDL RATIO
Cholesterol, Total: 204 mg/dL — ABNORMAL HIGH (ref 100–199)
HDL: 76 mg/dL (ref >39)
LDL Chol Calc (NIH): 108 mg/dL — ABNORMAL HIGH (ref 0–99)
Triglycerides: 117 mg/dL (ref 0–149)
VLDL Cholesterol Cal: 20 mg/dL (ref 5–40)

## 2020-04-27 LAB — TSH: TSH: 4.34 u[IU]/mL (ref 0.450–4.500)

## 2020-04-27 LAB — VITAMIN B12: Vitamin B-12: 595 pg/mL (ref 232–1245)

## 2020-04-27 LAB — PSA: Prostate Specific Ag, Serum: 0.3 ng/mL (ref 0.0–4.0)

## 2020-04-27 NOTE — Progress Notes (Signed)
Contacted via MyChart   Good evening Harold Tate, your labs have returned and overall no concerns with exception of cholesterol levels.  Are you taking cholesterol medication daily?  Please ensure you are and if LDL remains >70 next visit we may try increasing Rosuvastatin dosing.  Any questions? Keep being awesome!!  Thank you for allowing me to participate in your care. Kindest regards, Yazleemar Strassner

## 2020-05-09 ENCOUNTER — Other Ambulatory Visit: Payer: Self-pay | Admitting: Oncology

## 2020-05-11 ENCOUNTER — Inpatient Hospital Stay: Payer: Self-pay | Attending: Oncology

## 2020-05-20 NOTE — Progress Notes (Deleted)
[place]  Telephone:(336) [phone] Fax:(336) [phone]  ID: Harold Tate OB: 1966-01-12  MR#: [id number]  JAS#:[id number]  Patient Care Team: Guadalupe Maple, MD as PCP - General (Family Medicine) Lloyd Huger, MD as Consulting Physician (Oncology)  I connected with Harold Tate on 05/20/20 at  2:30 PM EST by {Blank single:19197::"video enabled telemedicine visit","telephone visit"} and verified that I am speaking with the correct person using two identifiers.   I discussed the limitations, risks, security and privacy concerns of performing an evaluation and management service by telemedicine and the availability of in-person appointments. I also discussed with the patient that there may be a patient responsible charge related to this service. The patient expressed understanding and agreed to proceed.   Other persons participating in the visit and their role in the encounter: Patient, MD.  Patient's location: Home. Provider's location: Clinic.  CHIEF COMPLAINT: Right leg DVT  INTERVAL HISTORY: Patient agreed to video assisted telemedicine visit for further evaluation and discussion of his laboratory work.  He currently feels well and is asymptomatic.  He is tolerating Xarelto without significant side effects.  He has no neurologic complaints.  He denies any recent fevers or illnesses.  He has a good appetite and denies weight loss.  He has no chest pain, shortness of breath, cough, or hemoptysis.  He denies any nausea, vomiting, constipation, or diarrhea.  He has no urinary complaints.  Patient offers no specific complaints today.  REVIEW OF SYSTEMS:   Review of Systems  Constitutional: Negative.  Negative for fever, malaise/fatigue and weight loss.  Respiratory: Negative.  Negative for cough, hemoptysis and shortness of breath.   Cardiovascular: Negative.  Negative for chest pain and leg swelling.  Gastrointestinal: Negative.  Negative for abdominal pain.   Genitourinary: Negative.  Negative for dysuria.  Musculoskeletal: Negative.  Negative for back pain.  Skin: Negative.  Negative for rash.  Neurological: Negative.  Negative for dizziness, focal weakness, weakness and headaches.  Psychiatric/Behavioral: Negative.  The patient is not nervous/anxious.     As per HPI. Otherwise, a complete review of systems is negative.  PAST MEDICAL HISTORY: Past Medical History:  Diagnosis Date  . Anxiety   . Arthritis   . Diabetes mellitus without complication ([place])   . Dyspnea   . Hip pain   . Hypertension   . Raynaud's syndrome   . Sleep apnea     CPAP/ Doesn't use    PAST SURGICAL HISTORY: Past Surgical History:  Procedure Laterality Date  . I & D EXTREMITY    . OPEN REDUCTION INTERNAL FIXATION (ORIF) TIBIA/FIBULA FRACTURE Left July 2014  . TOTAL HIP ARTHROPLASTY Right 11/03/2018   Procedure: RIGHT TOTAL HIP ARTHROPLASTY ANTERIOR APPROACH;  Surgeon: Hessie Knows, MD;  Location: [facility] ORS;  Service: Orthopedics;  Laterality: Right;  . TOTAL HIP ARTHROPLASTY Left 02/11/2019   Procedure: TOTAL HIP ARTHROPLASTY ANTERIOR APPROACH;  Surgeon: Hessie Knows, MD;  Location: [facility] ORS;  Service: Orthopedics;  Laterality: Left;    FAMILY HISTORY: Family History  Problem Relation Age of Onset  . Depression Sister     ADVANCED DIRECTIVES (Y/N):  N  HEALTH MAINTENANCE: Social History   Tobacco Use  . Smoking status: Current Every Day Smoker    Packs/day: 1.00    Years: 27.00    Pack years: 27.00    Types: Cigarettes  . Smokeless tobacco: Never Used  Vaping Use  . Vaping Use: Never used  Substance Use Topics  . Alcohol use: Yes  Alcohol/week: 14.0 standard drinks    Types: 14 Cans of beer per week  . Drug use: Not Currently    Types: Benzodiazepines     Colonoscopy:  PAP:  Bone density:  Lipid panel:  No Known Allergies  Current Outpatient Medications  Medication Sig Dispense Refill  . baclofen (LIORESAL) 10 MG tablet Take 1  tablet (10 mg total) by mouth as needed for muscle spasms (three times a day as needed for muscle spasms). 270 each 4  . clonazePAM (KLONOPIN) 0.5 MG tablet Take 1 tablet (0.5 mg total) by mouth 3 (three) times daily as needed for anxiety. 90 tablet 2  . gabapentin (NEURONTIN) 600 MG tablet Take 0.5 tablets (300 mg total) by mouth at bedtime. 90 tablet 4  . losartan (COZAAR) 25 MG tablet Take 1 tablet (25 mg total) by mouth daily. 90 tablet 3  . meclizine (ANTIVERT) 12.5 MG tablet Take 1 tablet (12.5 mg total) by mouth 3 (three) times daily as needed for dizziness. 30 tablet 0  . metFORMIN (GLUCOPHAGE-XR) 500 MG 24 hr tablet Take 1 tablet (500 mg total) by mouth daily with breakfast. 180 tablet 3  . rosuvastatin (CRESTOR) 5 MG tablet Take 1 tablet (5 mg total) by mouth daily. 90 tablet 3  . vitamin B-12 (CYANOCOBALAMIN) 1000 MCG tablet Take 1 tablet (1,000 mcg total) by mouth daily. 90 tablet 4  . XARELTO 20 MG TABS tablet Take 20 mg by mouth daily.     No current facility-administered medications for this visit.    OBJECTIVE: There were no vitals filed for this visit.   There is no height or weight on file to calculate BMI.    ECOG FS:0 - Asymptomatic  General: Well-developed, well-nourished, no acute distress. HEENT: Normocephalic. Neuro: Alert, answering all questions appropriately. Cranial nerves grossly intact. Psych: Normal affect.  LAB RESULTS:  Lab Results  Component Value Date   NA 135 04/26/2020   K 4.0 04/26/2020   CL 96 04/26/2020   CO2 24 04/26/2020   GLUCOSE 258 (H) 04/26/2020   BUN 6 04/26/2020   CREATININE 0.68 (L) 04/26/2020   CALCIUM 9.3 04/26/2020   PROT 7.5 08/27/2019   ALBUMIN 3.9 08/27/2019   AST 67 (H) 08/27/2019   ALT 71 (H) 08/27/2019   ALKPHOS 240 (H) 08/27/2019   BILITOT 1.6 (H) 08/27/2019   GFRNONAA 108 04/26/2020   GFRAA 125 04/26/2020    Lab Results  Component Value Date   WBC 7.3 09/03/2019   NEUTROABS 5.0 09/03/2019   HGB 16.3 09/03/2019    HCT 47.1 09/03/2019   MCV 104 (H) 09/03/2019   PLT 143 (L) 09/03/2019     STUDIES: No results found.  ASSESSMENT: Right leg DVT  PLAN:    1. Right leg DVT: Appears unprovoked.  Patient does not have any obvious transient risk factors.  He has a personal history of DVT.  Left leg DVT in 2015 was associated with a broken leg, surgery, and staph infection.  He has no family history of DVT.  Given the extent of the DVT, have recommended Xarelto for a total of 6 months.  Hypercoagulable work-up did reveal some minor abnormalities, but these all can be contributed to either being on Xarelto or the blood clot itself.  These results do not necessarily indicate an underlying clotting disorder.  Patient has been instructed to discontinue Xarelto at the end of November 2021.  He will then have repeat laboratory work 8 weeks later followed by video assisted telemedicine  visit.   I provided *** minutes of {Blank single:19197::"face-to-face video visit time","non face-to-face telephone visit time"} during this encounter which included chart review, counseling, and coordination of care as documented above.  Patient expressed understanding and was in agreement with this plan. He also understands that He can call clinic at any time with any questions, concerns, or complaints.     Lloyd Huger, MD   05/20/2020 2:02 PM

## 2020-05-23 ENCOUNTER — Inpatient Hospital Stay: Payer: Self-pay | Admitting: Oncology

## 2020-05-23 DIAGNOSIS — I82411 Acute embolism and thrombosis of right femoral vein: Secondary | ICD-10-CM

## 2020-05-26 ENCOUNTER — Ambulatory Visit: Payer: Self-pay | Admitting: *Deleted

## 2020-05-26 NOTE — Telephone Encounter (Signed)
I returned his call regarding being depressed.  See notes below. I spent about 30 minutes on the phone with him letting him talk. His step father passed away a couple of months ago with covid.   "I worry a lot".   "My Mom says I worry too much".    "I just don't have any energy".   (I could hear in his voice that he was not very energetic).  He denied wanting to hurt himself or anyone else.   "I'm not having those kinds of thoughts".    He has an appt with Marnee Guarneri on Monday 05/29/2020 to discuss the depression.  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   The depression is interfering with his job.  People are noticing I'm depressed.  I ended the conversation instructing him to go to the ED over the weekend if he developed suicidal thoughts.   "I'll be ok until Monday".   "I'm not going to the hospital".   I sent these notes to [facility] for [facility] information.   Reason for Disposition . Symptoms interfere with work or school  Answer Assessment - Initial Assessment Questions 1. CONCERN: "What happened that made you call today?"     I think I'm depressed.  Don't want to get up in the mornings.  I'm worried all the time.  My step father died from covid.   My family and friends have noticed I'm depressed. 2. DEPRESSION SYMPTOM SCREENING: "How are you feeling overall?" (e.g., decreased energy, increased sleeping or difficulty sleeping, difficulty concentrating, feelings of sadness, guilt, hopelessness, or worthlessness)     Lack of energy, sleeping a lot, I wake up then can't go back to sleep.    3. RISK OF HARM - SUICIDAL IDEATION:  "Do you ever have thoughts of hurting or killing yourself?"  (e.g., yes, no, no but preoccupation with thoughts about death)   - INTENT:  "Do you have thoughts of hurting or killing yourself right NOW?" (e.g., yes, no, N/A)   - PLAN: "Do you have a specific plan for how you  would do this?" (e.g., gun, knife, overdose, no plan, N/A)     No  4. RISK OF HARM - HOMICIDAL IDEATION:  "Do you ever have thoughts of hurting or killing someone else?"  (e.g., yes, no, no but preoccupation with thoughts about death)   - INTENT:  "Do you have thoughts of hurting or killing someone right NOW?" (e.g., yes, no, N/A)   - PLAN: "Do you have a specific plan for how you would do this?" (e.g., gun, knife, no plan, N/A)      No 5. FUNCTIONAL IMPAIRMENT: "How have things been going for you overall? Have you had more difficulty than usual doing your normal daily activities?"  (e.g., better, same, worse; self-care, school, work, interactions)     I get real down.    I run my own business.  My energy level not good.    I don't have the strength to do things like I use to.   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   I've had 2 hip replacements.   I have a hereditary condition that makes my legs and arms weak.     6. SUPPORT: "Who is with you now?" "Who do you live with?" "Do you have family or friends who you can talk to?"  I talk to my Mom.   7. THERAPIST: "Do you have a counselor or therapist? Name?"     No 8. STRESSORS: "Has there been any new stress or recent changes in your life?"     My step father passed away a couple of months ago.   9. ALCOHOL USE OR SUBSTANCE USE (DRUG USE): "Do you drink alcohol or use any illegal drugs?"     My mother says I worry too much.   I drink daily beer in the afternoons.     10. OTHER: "Do you have any other physical symptoms right now?" (e.g., fever)       Very tired.  11. PREGNANCY: "Is there any chance you are pregnant?" "When was your last menstrual period?"       N/A  Protocols used: DEPRESSION-A-AH

## 2020-05-26 NOTE — Telephone Encounter (Signed)
Noted  

## 2020-05-29 ENCOUNTER — Ambulatory Visit: Payer: Self-pay | Admitting: Nurse Practitioner

## 2020-05-30 ENCOUNTER — Ambulatory Visit (INDEPENDENT_AMBULATORY_CARE_PROVIDER_SITE_OTHER): Payer: Self-pay | Admitting: Nurse Practitioner

## 2020-05-30 ENCOUNTER — Other Ambulatory Visit: Payer: Self-pay

## 2020-05-30 ENCOUNTER — Encounter: Payer: Self-pay | Admitting: Nurse Practitioner

## 2020-05-30 VITALS — BP 161/94 | HR 88 | Temp 98.1°F | Ht 73.5 in | Wt 227.0 lb

## 2020-05-30 DIAGNOSIS — G114 Hereditary spastic paraplegia: Secondary | ICD-10-CM

## 2020-05-30 DIAGNOSIS — E669 Obesity, unspecified: Secondary | ICD-10-CM

## 2020-05-30 DIAGNOSIS — R5383 Other fatigue: Secondary | ICD-10-CM

## 2020-05-30 DIAGNOSIS — F321 Major depressive disorder, single episode, moderate: Secondary | ICD-10-CM

## 2020-05-30 DIAGNOSIS — I82411 Acute embolism and thrombosis of right femoral vein: Secondary | ICD-10-CM

## 2020-05-30 DIAGNOSIS — F411 Generalized anxiety disorder: Secondary | ICD-10-CM

## 2020-05-30 DIAGNOSIS — Z79899 Other long term (current) drug therapy: Secondary | ICD-10-CM

## 2020-05-30 DIAGNOSIS — E1169 Type 2 diabetes mellitus with other specified complication: Secondary | ICD-10-CM

## 2020-05-30 DIAGNOSIS — F17210 Nicotine dependence, cigarettes, uncomplicated: Secondary | ICD-10-CM

## 2020-05-30 MED ORDER — ROSUVASTATIN CALCIUM 5 MG PO TABS: 5.0000 mg | ORAL_TABLET | Freq: Every day | ORAL | 4 refills | Status: DC

## 2020-05-30 MED ORDER — GABAPENTIN 600 MG PO TABS: 300.0000 mg | ORAL_TABLET | Freq: Every day | ORAL | 4 refills | Status: DC

## 2020-05-30 MED ORDER — DAPAGLIFLOZIN PROPANEDIOL 10 MG PO TABS
10.0000 mg | ORAL_TABLET | Freq: Every day | ORAL | 4 refills | Status: DC
Start: 2020-05-30 — End: 2020-08-16

## 2020-05-30 MED ORDER — CITALOPRAM HYDROBROMIDE 20 MG PO TABS: 20.0000 mg | ORAL_TABLET | Freq: Every day | ORAL | 3 refills | Status: DC

## 2020-05-30 NOTE — Assessment & Plan Note (Signed)
Chronic, ongoing.  A1c upward trend to 9.1% in December, he is not always adherent to regimen.  At this time will discontinue Metformin due to diarrhea even with low dose 500 MG XR daily.  Start Farxiga 10 MG daily and monitor -- to alert provider if side effects present. Again highly recommend he check BS at least once a day and document for provider.  Focus on diabetic diet at home, discussed with him -- eats lot of oranges and frosted flakes. He refuses to start alternate medication like GLP1, which may offer benefit.  Return in 6 weeks.

## 2020-05-30 NOTE — Assessment & Plan Note (Signed)
Continue collaboration with [place].  Baclofen for discomfort as needed.  Continue Gabapentin 300 MG QHS for discomfort -- this may also benefit sleep.

## 2020-05-30 NOTE — Progress Notes (Signed)
BP (!) 161/94   Pulse 88   Temp 98.1 F (36.7 C) (Oral)   Ht 6' 1.5" (1.867 m)   Wt 227 lb (103 kg)   SpO2 96%   BMI 29.54 kg/m    Subjective:    Patient ID: Harold Tate, male    DOB: 06/12/1965, 55 y.o.   MRN: [id number]  HPI: Harold Tate is a 55 y.o. male  Chief Complaint  Patient presents with  . Low Energy  . Over Sleep way too much  . Depression    Patient thinks that he may have depression   . Medication Problem    Metformin giving him diarrhea. He has messed in his pants on occasion Happening 3 to 4 times a day. He would like to switch to [place]. He was told about it by a friend Cleora Fleet   DIABETES Last A1C 9.1% in December, on Metformin XR 1000 MG daily -- but is taking only one tablet a day (500 XR MG) due to diarrhea.  Would like to come off Metformin and use Wilder Glade which his friend takes. Hypoglycemic episodes:no Polydipsia/polyuria:no Visual disturbance:no Chest pain:no Paresthesias:no Glucose Monitoring:no Accucheck frequency: Not Checking Fasting glucose: Post prandial: Evening: Before meals: Taking Insulin?:no Long acting insulin: Short acting insulin: Blood Pressure Monitoring:not checking Retinal Examination:Not up to Date Foot Exam:Up to Date Pneumovax:Not up to Date Influenza:Not up to Date Aspirin:no  ANXIETY/STRESS Reports recent more depressed mood after family loss -- lost his dad around 3 months ago.  Has been sleeping too much and has low energy -- has been this way for >3 months -- he used to take Goodies powder and coffee, but with his BP does not do this anymore. Last testosterone level in 2018 was 422.  Continues on Clonazepam 0.5 MG TID ordered by previous PCP. Has been on this for several years, used to be Xanax but was changed -- he endorses last visit that at times he takes this four times a day -- advised him not to do  this.Pt is aware of risks ofbenzomedication use to include increased sedation, respiratory suppression, falls, extrapyramidal movements, dependence and cardiovascular events. Ptwould like to continue treatment as benefit determined to outweigh risk.Discussed current research with him. He reports in past when he tried to quit taking Xanax 1 MG TID, it became worse and then he broke out in hives.   Has tried Wellbutrin and Prozac in past without success. Wellbutrin caused weird thoughts.  On review of PDMP his last Klonopin fill was 05/11/20, no other recent controlled substance fills.   Duration:stable Anxious mood:no Excessive worrying:no Irritability:no Sweating:no Nausea:no Palpitations:no Hyperventilation:no Panic attacks:no Agoraphobia:no Obscessions/compulsions:no Depressed mood:no Depression screen [facility] 2/9 05/30/2020 04/26/2020 12/22/2019 06/10/2019 03/09/2019  Decreased Interest 1 2 0 0 0  Down, Depressed, Hopeless 3 2 0 0 0  PHQ - 2 Score 4 4 0 0 0  Altered sleeping 3 3 1  0 0  Tired, decreased energy 3 3 0 0 0  Change in appetite 0 0 0 0 0  Feeling bad or failure about yourself  1 0 0 0 0  Trouble concentrating 1 1 0 0 0  Moving slowly or fidgety/restless 0 0 2 0 0  Suicidal thoughts 1 0 0 0 0  PHQ-9 Score 13 11 3  0 0  Difficult doing work/chores - - - Not difficult at all Not difficult at all   SPASTIC PARAPLEGIA, HEREDITARY: [facility] by Dr. Werner Lean.  Last visit 05/11/20.  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  This causes discomfort for him, particularly in legs.  Takes Baclofen at this time as needed.  Relevant past medical, surgical, family and social history reviewed and updated as indicated. Interim medical history since our last visit reviewed. Allergies and medications reviewed and updated.  Review of Systems  Constitutional: Negative for activity change, diaphoresis, fatigue and  fever.  Respiratory: Negative for cough, chest tightness, shortness of breath and wheezing.   Cardiovascular: Negative for chest pain, palpitations and leg swelling.  Gastrointestinal: Negative.   Endocrine: Negative for polydipsia, polyphagia and polyuria.  Neurological: Negative.   Psychiatric/Behavioral: Negative for decreased concentration, self-injury, sleep disturbance and suicidal ideas. The patient is not nervous/anxious.     Per HPI unless specifically indicated above     Objective:    BP (!) 161/94   Pulse 88   Temp 98.1 F (36.7 C) (Oral)   Ht 6' 1.5" (1.867 m)   Wt 227 lb (103 kg)   SpO2 96%   BMI 29.54 kg/m   Wt Readings from Last 3 Encounters:  05/30/20 227 lb (103 kg)  04/26/20 230 lb 9.6 oz (104.6 kg)  09/20/19 233 lb 4.8 oz (105.8 kg)    Physical Exam Vitals and nursing note reviewed.  Constitutional:      General: He is awake. He is not in acute distress.    Appearance: He is well-developed. He is obese. He is not ill-appearing.  HENT:     Head: Normocephalic and atraumatic.     Right Ear: Hearing normal. No drainage.     Left Ear: Hearing normal. No drainage.  Eyes:     General: Lids are normal.        Right eye: No discharge.        Left eye: No discharge.     Conjunctiva/sclera: Conjunctivae normal.     Pupils: Pupils are equal, round, and reactive to light.  Neck:     Thyroid: No thyromegaly.     Vascular: No carotid bruit.  Cardiovascular:     Rate and Rhythm: Normal rate and regular rhythm.     Heart sounds: Normal heart sounds, S1 normal and S2 normal. No murmur heard. No gallop.   Pulmonary:     Effort: Pulmonary effort is normal. No accessory muscle usage or respiratory distress.     Breath sounds: Normal breath sounds.  Abdominal:     General: Bowel sounds are normal.     Palpations: Abdomen is soft.  Musculoskeletal:        General: Normal range of motion.     Cervical back: Normal range of motion and neck supple.     Right lower  leg: No edema.     Left lower leg: No edema.  Skin:    General: Skin is warm and dry.     Capillary Refill: Capillary refill takes less than 2 seconds.     Findings: No rash.  Neurological:     Mental Status: He is alert and oriented to person, place, and time.     Deep Tendon Reflexes: Reflexes are normal and symmetric.  Psychiatric:        Attention and Perception: Attention normal.        Mood and Affect: Mood normal.        Speech: Speech normal.        Behavior: Behavior normal. Behavior is cooperative.  Thought Content: Thought content normal.    Results for orders placed or performed in visit on 04/26/20  Hepatitis C antibody  Result Value Ref Range   Hep C Virus Ab <0.1 0.0 - 0.9 s/co ratio  PSA  Result Value Ref Range   Prostate Specific Ag, Serum 0.3 0.0 - 4.0 ng/mL  TSH  Result Value Ref Range   TSH 4.340 0.450 - 4.500 uIU/mL  Vitamin B12  Result Value Ref Range   Vitamin B-12 595 232 - 1,245 pg/mL  Bayer DCA Hb A1c Waived  Result Value Ref Range   HB A1C (BAYER DCA - WAIVED) 9.1 (H) <1.8 %  Basic metabolic panel  Result Value Ref Range   Glucose 258 (H) 65 - 99 mg/dL   BUN 6 6 - 24 mg/dL   Creatinine, Ser 0.68 (L) 0.76 - 1.27 mg/dL   GFR calc non Af Amer 108 >59 mL/min/1.73   GFR calc Af Amer 125 >59 mL/min/1.73   BUN/Creatinine Ratio 9 9 - 20   Sodium 135 134 - 144 mmol/L   Potassium 4.0 3.5 - 5.2 mmol/L   Chloride 96 96 - 106 mmol/L   CO2 24 20 - 29 mmol/L   Calcium 9.3 8.7 - 10.2 mg/dL  Lipid Panel w/o Chol/HDL Ratio  Result Value Ref Range   Cholesterol, Total 204 (H) 100 - 199 mg/dL   Triglycerides 117 0 - 149 mg/dL   HDL 76 >39 mg/dL   VLDL Cholesterol Cal 20 5 - 40 mg/dL   LDL Chol Calc (NIH) 108 (H) 0 - 99 mg/dL      Assessment & Plan:   Problem List Items Addressed This Visit      Endocrine   Type 2 diabetes mellitus with obesity (HCC)    Chronic, ongoing.  A1c upward trend to 9.1% in December, he is not always adherent to  regimen.  At this time will discontinue Metformin due to diarrhea even with low dose 500 MG XR daily.  Start Farxiga 10 MG daily and monitor -- to alert provider if side effects present. Again highly recommend he check BS at least once a day and document for provider.  Focus on diabetic diet at home, discussed with him -- eats lot of oranges and frosted flakes. He refuses to start alternate medication like GLP1, which may offer benefit.  Return in 6 weeks.      Relevant Medications   dapagliflozin propanediol (FARXIGA) 10 MG TABS tablet   rosuvastatin (CRESTOR) 5 MG tablet     Nervous and Auditory   Hereditary spastic paraparesis ([place])    Continue collaboration with [facility].  Baclofen for discomfort as needed.  Continue Gabapentin 300 MG QHS for discomfort -- this may also benefit sleep.        Other   Generalized anxiety disorder (Chronic)    Chronic, ongoing.  Has been on benzo long term, recommended trial of SSRI which he agrees to today.  Celexa 20 MG daily script sent.  Discussed at length risks of long term benzo use.  He is aware of risks.  Refill sent #90 plus 2 refills last visit.  Will have return to office in 6 weeks.  UDS up to date.      Relevant Medications   citalopram (CELEXA) 20 MG tablet   Nicotine dependence, cigarettes, uncomplicated    I have recommended complete cessation of tobacco use. I have discussed various options available for assistance with tobacco cessation including over the counter methods (Nicotine  gum, patch and lozenges). We also discussed prescription options (Chantix, Nicotine Inhaler / Nasal Spray). The patient is not interested in pursuing any prescription tobacco cessation options at this time.       Depression, major, single episode, moderate (HCC) - Primary    Chronic, ongoing.  Denies SI/HI.   Would benefit from SSRI, he agrees to trial of Celexa 20 MG at this time.  Script sent in.  Refills sent on Klonopin at last visit, refer to anxiety plan.   Have recommended psychiatry visit, he refuses.  Return in 6 weeks for visit.      Relevant Medications   citalopram (CELEXA) 20 MG tablet   Long-term current use of benzodiazepine    Chronic, ongoing.  Has been on benzo long term.  Discussed at length risks of long term benzo use.  He is aware of risks.  Refill sent #90 plus 2 refills last visit.  UDS up to date.  Will refer to psychiatry if any increased doses requested, he refuses visit with them at this time.       Other Visit Diagnoses    Low energy       Suspect mood related, but will check TSH and Testosterone levels today -- discussed these testosterone levels may need repeat due to time of day.   Relevant Orders   Testosterone, free, total([facility])   TSH   CBC with Differential/Platelet       Follow up plan: Return in about 6 weeks (around 07/11/2020) for T2DM, HTN/HLD, MOOD, B12.

## 2020-05-30 NOTE — Assessment & Plan Note (Signed)
Chronic, ongoing.  Has been on benzo long term.  Discussed at length risks of long term benzo use.  He is aware of risks.  Refill sent #90 plus 2 refills last visit.  UDS up to date.  Will refer to psychiatry if any increased doses requested, he refuses visit with them at this time.

## 2020-05-30 NOTE — Assessment & Plan Note (Signed)
Chronic, ongoing.  Has been on benzo long term, recommended trial of SSRI which he agrees to today.  Celexa 20 MG daily script sent.  Discussed at length risks of long term benzo use.  He is aware of risks.  Refill sent #90 plus 2 refills last visit.  Will have return to office in 6 weeks.  UDS up to date.

## 2020-05-30 NOTE — Assessment & Plan Note (Signed)
I have recommended complete cessation of tobacco use. I have discussed various options available for assistance with tobacco cessation including over the counter methods (Nicotine gum, patch and lozenges). We also discussed prescription options (Chantix, Nicotine Inhaler / Nasal Spray). The patient is not interested in pursuing any prescription tobacco cessation options at this time.  

## 2020-05-30 NOTE — Assessment & Plan Note (Addendum)
Chronic, ongoing.  Denies SI/HI.   Would benefit from SSRI, he agrees to trial of Celexa 20 MG at this time.  Script sent in.  Refills sent on Klonopin at last visit, refer to anxiety plan.  Have recommended psychiatry visit, he refuses.  Return in 6 weeks for visit.

## 2020-05-30 NOTE — Patient Instructions (Signed)
Diabetes Mellitus and Nutrition, Adult When you have diabetes, or diabetes mellitus, it is very important to have healthy eating habits because your blood sugar (glucose) levels are greatly affected by what you eat and drink. Eating healthy foods in the right amounts, at about the same times every day, can help you:  Control your blood glucose.  Lower your risk of heart disease.  Improve your blood pressure.  Reach or maintain a healthy weight. What can affect my meal plan? Every person with diabetes is different, and each person has different needs for a meal plan. Your health care provider may recommend that you work with a dietitian to make a meal plan that is best for you. Your meal plan may vary depending on factors such as:  The calories you need.  The medicines you take.  Your weight.  Your blood glucose, blood pressure, and cholesterol levels.  Your activity level.  Other health conditions you have, such as heart or kidney disease. How do carbohydrates affect me? Carbohydrates, also called carbs, affect your blood glucose level more than any other type of food. Eating carbs naturally raises the amount of glucose in your blood. Carb counting is a method for keeping track of how many carbs you eat. Counting carbs is important to keep your blood glucose at a healthy level, especially if you use insulin or take certain oral diabetes medicines. It is important to know how many carbs you can safely have in each meal. This is different for every person. Your dietitian can help you calculate how many carbs you should have at each meal and for each snack. How does alcohol affect me? Alcohol can cause a sudden decrease in blood glucose (hypoglycemia), especially if you use insulin or take certain oral diabetes medicines. Hypoglycemia can be a life-threatening condition. Symptoms of hypoglycemia, such as sleepiness, dizziness, and confusion, are similar to symptoms of having too much  alcohol.  Do not drink alcohol if: ? Your health care provider tells you not to drink. ? You are pregnant, may be pregnant, or are planning to become pregnant.  If you drink alcohol: ? Do not drink on an empty stomach. ? Limit how much you use to:  0-1 drink a day for women.  0-2 drinks a day for men. ? Be aware of how much alcohol is in your drink. In the [place] one drink equals one 12 oz bottle of beer (355 mL), one 5 oz glass of wine (148 mL), or one 1 oz glass of hard liquor (44 mL). ? Keep yourself hydrated with water, diet soda, or unsweetened iced tea.  Keep in mind that regular soda, juice, and other mixers may contain a lot of sugar and must be counted as carbs. What are tips for following this plan? Reading food labels  Start by checking the serving size on the "Nutrition Facts" label of packaged foods and drinks. The amount of calories, carbs, fats, and other nutrients listed on the label is based on one serving of the item. Many items contain more than one serving per package.  Check the total grams (g) of carbs in one serving. You can calculate the number of servings of carbs in one serving by dividing the total carbs by 15. For example, if a food has 30 g of total carbs per serving, it would be equal to 2 servings of carbs.  Check the number of grams (g) of saturated fats and trans fats in one serving. Choose foods that have   a low amount or none of these fats.  Check the number of milligrams (mg) of salt (sodium) in one serving. Most people should limit total sodium intake to less than 2,300 mg per day.  Always check the nutrition information of foods labeled as "low-fat" or "nonfat." These foods may be higher in added sugar or refined carbs and should be avoided.  Talk to your dietitian to identify your daily goals for nutrients listed on the label. Shopping  Avoid buying canned, pre-made, or processed foods. These foods tend to be high in fat, sodium, and added  sugar.  Shop around the outside edge of the grocery store. This is where you will most often find fresh fruits and vegetables, bulk grains, fresh meats, and fresh dairy. Cooking  Use low-heat cooking methods, such as baking, instead of high-heat cooking methods like deep frying.  Cook using healthy oils, such as olive, canola, or sunflower oil.  Avoid cooking with butter, cream, or high-fat meats. Meal planning  Eat meals and snacks regularly, preferably at the same times every day. Avoid going long periods of time without eating.  Eat foods that are high in fiber, such as fresh fruits, vegetables, beans, and whole grains. Talk with your dietitian about how many servings of carbs you can eat at each meal.  Eat 4-6 oz (112-168 g) of lean protein each day, such as lean meat, chicken, fish, eggs, or tofu. One ounce (oz) of lean protein is equal to: ? 1 oz (28 g) of meat, chicken, or fish. ? 1 egg. ?  cup (62 g) of tofu.  Eat some foods each day that contain healthy fats, such as avocado, nuts, seeds, and fish.   What foods should I eat? Fruits Berries. Apples. Oranges. Peaches. Apricots. Plums. Grapes. Mango. Papaya. Pomegranate. Kiwi. Cherries. Vegetables Lettuce. Spinach. Leafy greens, including kale, chard, collard greens, and mustard greens. Beets. Cauliflower. Cabbage. Broccoli. Carrots. Green beans. Tomatoes. Peppers. Onions. Cucumbers. Brussels sprouts. Grains Whole grains, such as whole-wheat or whole-grain bread, crackers, tortillas, cereal, and pasta. Unsweetened oatmeal. Quinoa. Brown or wild rice. Meats and other proteins Seafood. Poultry without skin. Lean cuts of poultry and beef. Tofu. Nuts. Seeds. Dairy Low-fat or fat-free dairy products such as milk, yogurt, and cheese. The items listed above may not be a complete list of foods and beverages you can eat. Contact a dietitian for more information. What foods should I avoid? Fruits Fruits canned with  syrup. Vegetables Canned vegetables. Frozen vegetables with butter or cream sauce. Grains Refined white flour and flour products such as bread, pasta, snack foods, and cereals. Avoid all processed foods. Meats and other proteins Fatty cuts of meat. Poultry with skin. Breaded or fried meats. Processed meat. Avoid saturated fats. Dairy Full-fat yogurt, cheese, or milk. Beverages Sweetened drinks, such as soda or iced tea. The items listed above may not be a complete list of foods and beverages you should avoid. Contact a dietitian for more information. Questions to ask a health care provider  Do I need to meet with a diabetes educator?  Do I need to meet with a dietitian?  What number can I call if I have questions?  When are the best times to check my blood glucose? Where to find more information:  American Diabetes Association: diabetes.org  Academy of Nutrition and Dietetics: www.eatright.org  National Institute of Diabetes and Digestive and Kidney Diseases: www.niddk.nih.gov  Association of Diabetes Care and Education Specialists: www.diabeteseducator.org Summary  It is important to have healthy eating   habits because your blood sugar (glucose) levels are greatly affected by what you eat and drink.  A healthy meal plan will help you control your blood glucose and maintain a healthy lifestyle.  Your health care provider may recommend that you work with a dietitian to make a meal plan that is best for you.  Keep in mind that carbohydrates (carbs) and alcohol have immediate effects on your blood glucose levels. It is important to count carbs and to use alcohol carefully. This information is not intended to replace advice given to you by your health care provider. Make sure you discuss any questions you have with your health care provider. Document Revised: 03/23/2019 Document Reviewed: 03/23/2019 Elsevier Patient Education  2021 [employer].  

## 2020-05-31 ENCOUNTER — Encounter: Payer: Self-pay | Admitting: Nurse Practitioner

## 2020-05-31 DIAGNOSIS — D696 Thrombocytopenia, unspecified: Secondary | ICD-10-CM | POA: Insufficient documentation

## 2020-05-31 LAB — CBC WITH DIFFERENTIAL/PLATELET
Basophils Absolute: 0.1 10*3/uL (ref 0.0–0.2)
Basos: 2 %
EOS (ABSOLUTE): 0.1 10*3/uL (ref 0.0–0.4)
Eos: 2 %
Hematocrit: 50.2 % (ref 37.5–51.0)
Hemoglobin: 17.6 g/dL (ref 13.0–17.7)
Immature Grans (Abs): 0 10*3/uL (ref 0.0–0.1)
Immature Granulocytes: 0 %
Lymphocytes Absolute: 1 10*3/uL (ref 0.7–3.1)
Lymphs: 17 %
MCH: 35.1 pg — ABNORMAL HIGH (ref 26.6–33.0)
MCHC: 35.1 g/dL (ref 31.5–35.7)
MCV: 100 fL — ABNORMAL HIGH (ref 79–97)
Monocytes Absolute: 0.7 10*3/uL (ref 0.1–0.9)
Monocytes: 11 %
Neutrophils Absolute: 4.3 10*3/uL (ref 1.4–7.0)
Neutrophils: 68 %
Platelets: 111 10*3/uL — ABNORMAL LOW (ref 150–450)
RBC: 5.02 x10E6/uL (ref 4.14–5.80)
RDW: 12.1 % (ref 11.6–15.4)
WBC: 6.3 10*3/uL (ref 3.4–10.8)

## 2020-05-31 LAB — TSH: TSH: 2.83 u[IU]/mL (ref 0.450–4.500)

## 2020-05-31 LAB — TESTOSTERONE, FREE, TOTAL, SHBG
Sex Hormone Binding: 64.3 nmol/L (ref 19.3–76.4)
Testosterone, Free: 11.4 pg/mL (ref 7.2–24.0)
Testosterone: 561 ng/dL (ref 264–916)

## 2020-05-31 NOTE — Progress Notes (Signed)
Contacted via MyChart   Good evening Shanon Brow.  Your labs have returned.  Testosterone labs look normal.  Thyroid normal.  CBC shows no anemia and platelets continue to be on lower side.  Will continue to monitor these closely.  Would also like to discuss rechecking leg via imaging at next visit, due to your past DVT, and if improved may be able to come off of blood thinner.  Any questions? Keep being awesome!!  Thank you for allowing me to participate in your care. Kindest regards, Ahuva Poynor

## 2020-06-01 ENCOUNTER — Telehealth: Payer: Self-pay | Admitting: Oncology

## 2020-06-01 NOTE — Telephone Encounter (Signed)
Patients sister called to reschedule lab and virtual visit from  1/13 and 1/25.  Routing to team to ensure lab orders are ready for visit on 2/4 at 345 pm.

## 2020-06-01 NOTE — Telephone Encounter (Signed)
Orders updated. Thank you.

## 2020-06-01 NOTE — Addendum Note (Signed)
Addended by: Venita Lick on: 06/01/2020 06:38 PM   Modules accepted: Orders

## 2020-06-02 ENCOUNTER — Inpatient Hospital Stay: Payer: 59 | Attending: Oncology

## 2020-06-02 ENCOUNTER — Other Ambulatory Visit: Payer: Self-pay

## 2020-06-02 DIAGNOSIS — I82411 Acute embolism and thrombosis of right femoral vein: Secondary | ICD-10-CM | POA: Diagnosis present

## 2020-06-02 LAB — ANTITHROMBIN III: AntiThromb III Func: 85 % (ref 75–120)

## 2020-06-02 NOTE — Addendum Note (Signed)
Addended by: Marnee Guarneri T on: 06/02/2020 10:37 AM   Modules accepted: Orders

## 2020-06-03 LAB — PROTEIN C, TOTAL: Protein C, Total: 79 % (ref 60–150)

## 2020-06-03 LAB — LUPUS ANTICOAGULANT PANEL
DRVVT: 29.1 s (ref 0.0–47.0)
PTT Lupus Anticoagulant: 29.7 s (ref 0.0–51.9)

## 2020-06-03 LAB — CARDIOLIPIN ANTIBODIES, IGG, IGM, IGA
Anticardiolipin IgA: <9 APL U/mL (ref 0–11)
Anticardiolipin IgG: <9 GPL U/mL (ref 0–14)
Anticardiolipin IgM: 16 MPL U/mL — ABNORMAL HIGH (ref 0–12)

## 2020-06-05 NOTE — Progress Notes (Signed)
  [facility]  [phone] [phone]) [phone]  ID: [name] OB: 06-Mar-1966  MR#: [id number]  QHU#:[id number]  Patient Care Team: Guadalupe Maple, MD as PCP - General (Family Medicine) Lloyd Huger, MD as Consulting Physician (Oncology)    Lloyd Huger, MD   06/06/2020 2:58 PM     This encounter was created in error - please disregard.

## 2020-06-06 ENCOUNTER — Encounter: Payer: Self-pay | Admitting: Oncology

## 2020-06-06 ENCOUNTER — Inpatient Hospital Stay (HOSPITAL_BASED_OUTPATIENT_CLINIC_OR_DEPARTMENT_OTHER): Payer: 59 | Admitting: Oncology

## 2020-06-06 ENCOUNTER — Inpatient Hospital Stay: Payer: 59 | Admitting: Oncology

## 2020-06-06 DIAGNOSIS — I82411 Acute embolism and thrombosis of right femoral vein: Secondary | ICD-10-CM | POA: Diagnosis not present

## 2020-06-07 ENCOUNTER — Telehealth: Payer: 59 | Admitting: Oncology

## 2020-06-07 ENCOUNTER — Telehealth: Payer: Self-pay | Admitting: Oncology

## 2020-06-07 NOTE — Progress Notes (Signed)
[facility]  [phone] [phone]  ID: Harold Tate OB: 03-Apr-1966  MR#: [id number]  IRC#:[id number]  Patient Care Team: Guadalupe Maple, MD as PCP - General (Family Medicine) Lloyd Huger, MD as Consulting Physician (Oncology)  I connected with Harold Tate on 06/07/20 at 11:30 AM EST by telephone visit and verified that I am speaking with the correct person using two identifiers.   I discussed the limitations, risks, security and privacy concerns of performing an evaluation and management service by telemedicine and the availability of in-person appointments. I also discussed with the patient that there may be a patient responsible charge related to this service. The patient expressed understanding and agreed to proceed.   Other persons participating in the visit and their role in the encounter: Patient, MD.  Patient's location: Home. Provider's location: Clinic.  CHIEF COMPLAINT: Right leg DVT  INTERVAL HISTORY: Patient was unable to complete video portion of visit therefore consultation was done by telephone. He discontinued Xarelto approximately 2 months ago. He currently feels well and is asymptomatic. He has no neurologic complaints.  He denies any recent fevers or illnesses.  He has a good appetite and denies weight loss.  He has no chest pain, shortness of breath, cough, or hemoptysis.  He denies any nausea, vomiting, constipation, or diarrhea.  He has no urinary complaints. Patient offers no specific complaints today.  REVIEW OF SYSTEMS:   Review of Systems  Constitutional: Negative.  Negative for fever, malaise/fatigue and weight loss.  Respiratory: Negative.  Negative for cough, hemoptysis and shortness of breath.   Cardiovascular: Negative.  Negative for chest pain and leg swelling.  Gastrointestinal: Negative.  Negative for abdominal pain.  Genitourinary: Negative.  Negative for dysuria.  Musculoskeletal: Negative.   Negative for back pain.  Skin: Negative.  Negative for rash.  Neurological: Negative.  Negative for dizziness, focal weakness, weakness and headaches.  Psychiatric/Behavioral: Negative.  The patient is not nervous/anxious.     As per HPI. Otherwise, a complete review of systems is negative.  PAST MEDICAL HISTORY: Past Medical History:  Diagnosis Date  . Anxiety   . Arthritis   . Diabetes mellitus without complication ([place])   . Dyspnea   . Hip pain   . Hypertension   . Raynaud's syndrome   . Sleep apnea     CPAP/ Doesn't use    PAST SURGICAL HISTORY: Past Surgical History:  Procedure Laterality Date  . I & D EXTREMITY    . OPEN REDUCTION INTERNAL FIXATION (ORIF) TIBIA/FIBULA FRACTURE Left July 2014  . TOTAL HIP ARTHROPLASTY Right 11/03/2018   Procedure: RIGHT TOTAL HIP ARTHROPLASTY ANTERIOR APPROACH;  Surgeon: Hessie Knows, MD;  Location: [facility] ORS;  Service: Orthopedics;  Laterality: Right;  . TOTAL HIP ARTHROPLASTY Left 02/11/2019   Procedure: TOTAL HIP ARTHROPLASTY ANTERIOR APPROACH;  Surgeon: Hessie Knows, MD;  Location: [facility] ORS;  Service: Orthopedics;  Laterality: Left;    FAMILY HISTORY: Family History  Problem Relation Age of Onset  . Depression Sister     ADVANCED DIRECTIVES (Y/N):  N  HEALTH MAINTENANCE: Social History   Tobacco Use  . Smoking status: Current Every Day Smoker    Packs/day: 1.00    Years: 27.00    Pack years: 27.00    Types: Cigarettes  . Smokeless tobacco: Never Used  Vaping Use  . Vaping Use: Never used  Substance Use Topics  . Alcohol use: Yes    Alcohol/week: 14.0 standard drinks    Types:  14 Cans of beer per week  . Drug use: Not Currently    Types: Benzodiazepines     Colonoscopy:  PAP:  Bone density:  Lipid panel:  No Known Allergies  Current Outpatient Medications  Medication Sig Dispense Refill  . baclofen (LIORESAL) 10 MG tablet Take 1 tablet (10 mg total) by mouth as needed for muscle spasms (three times a day as  needed for muscle spasms). (Patient not taking: Reported on 06/06/2020) 270 each 4  . citalopram (CELEXA) 20 MG tablet Take 1 tablet (20 mg total) by mouth daily. 30 tablet 3  . clonazePAM (KLONOPIN) 0.5 MG tablet Take 1 tablet (0.5 mg total) by mouth 3 (three) times daily as needed for anxiety. 90 tablet 2  . dapagliflozin propanediol (FARXIGA) 10 MG TABS tablet Take 1 tablet (10 mg total) by mouth daily before breakfast. 90 tablet 4  . gabapentin (NEURONTIN) 600 MG tablet Take 0.5 tablets (300 mg total) by mouth at bedtime. 90 tablet 4  . losartan (COZAAR) 25 MG tablet Take 1 tablet (25 mg total) by mouth daily. 90 tablet 3  . rosuvastatin (CRESTOR) 5 MG tablet Take 1 tablet (5 mg total) by mouth daily. 90 tablet 4  . vitamin B-12 (CYANOCOBALAMIN) 1000 MCG tablet Take 1 tablet (1,000 mcg total) by mouth daily. 90 tablet 4   No current facility-administered medications for this visit.    OBJECTIVE: There were no vitals filed for this visit.   There is no height or weight on file to calculate BMI.    ECOG FS:0 - Asymptomatic   LAB RESULTS:  Lab Results  Component Value Date   NA 135 04/26/2020   K 4.0 04/26/2020   CL 96 04/26/2020   CO2 24 04/26/2020   GLUCOSE 258 (H) 04/26/2020   BUN 6 04/26/2020   CREATININE 0.68 (L) 04/26/2020   CALCIUM 9.3 04/26/2020   PROT 7.5 08/27/2019   ALBUMIN 3.9 08/27/2019   AST 67 (H) 08/27/2019   ALT 71 (H) 08/27/2019   ALKPHOS 240 (H) 08/27/2019   BILITOT 1.6 (H) 08/27/2019   GFRNONAA 108 04/26/2020   GFRAA 125 04/26/2020    Lab Results  Component Value Date   WBC 6.3 05/30/2020   NEUTROABS 4.3 05/30/2020   HGB 17.6 05/30/2020   HCT 50.2 05/30/2020   MCV 100 (H) 05/30/2020   PLT 111 (L) 05/30/2020     STUDIES: No results found.  ASSESSMENT: Right leg DVT  PLAN:    1. Right leg DVT: Appears unprovoked.  Patient did have any obvious transient risk factors.  He has a personal history of DVT.  Left leg DVT in 2015 was associated with a  broken leg, surgery, and staph infection.  He has no family history of DVT. Patient has completed 6 months of Xarelto. Hypercoagulable work-up is negative and repeat laboratory work off of Xarelto has normalized. He has a mildly elevated anticardiolipin IgM component of 16, but to be concerning this needs persistently elevated of at least 40-60. No other intervention is needed at this time. Patient does not require additional anticoagulation. If patient had blood clot for any reason in the future, would likely recommend lifelong anticoagulation at that time. No further follow-up has been scheduled. Please refer patient back if there are any questions or concerns.   I provided 20 minutes of non face-to-face telephone visit time during this encounter which included chart review, counseling, and coordination of care as documented above.  Patient expressed understanding and was in agreement  with this plan. He also understands that He can call clinic at any time with any questions, concerns, or complaints.     Lloyd Huger, MD   06/07/2020 6:47 AM

## 2020-06-07 NOTE — Telephone Encounter (Signed)
Left VM with pt to notify him of appt. Scheduled for 2/14.

## 2020-06-08 ENCOUNTER — Telehealth: Payer: Self-pay | Admitting: Oncology

## 2020-06-08 ENCOUNTER — Inpatient Hospital Stay: Payer: 59 | Admitting: Oncology

## 2020-06-08 NOTE — Telephone Encounter (Signed)
Left VM with pt notifying him of appt. Cancelled on 2/14.

## 2020-06-12 ENCOUNTER — Ambulatory Visit: Payer: 59 | Admitting: Oncology

## 2020-07-11 ENCOUNTER — Encounter: Payer: Self-pay | Admitting: Nurse Practitioner

## 2020-07-11 ENCOUNTER — Other Ambulatory Visit: Payer: Self-pay

## 2020-07-11 ENCOUNTER — Ambulatory Visit (INDEPENDENT_AMBULATORY_CARE_PROVIDER_SITE_OTHER): Payer: 59 | Admitting: Nurse Practitioner

## 2020-07-11 VITALS — BP 122/78 | HR 84 | Temp 97.8°F | Wt 210.6 lb

## 2020-07-11 DIAGNOSIS — G114 Hereditary spastic paraplegia: Secondary | ICD-10-CM

## 2020-07-11 DIAGNOSIS — Z79899 Other long term (current) drug therapy: Secondary | ICD-10-CM

## 2020-07-11 DIAGNOSIS — E1159 Type 2 diabetes mellitus with other circulatory complications: Secondary | ICD-10-CM | POA: Diagnosis not present

## 2020-07-11 DIAGNOSIS — E785 Hyperlipidemia, unspecified: Secondary | ICD-10-CM

## 2020-07-11 DIAGNOSIS — E1169 Type 2 diabetes mellitus with other specified complication: Secondary | ICD-10-CM | POA: Diagnosis not present

## 2020-07-11 DIAGNOSIS — E538 Deficiency of other specified B group vitamins: Secondary | ICD-10-CM

## 2020-07-11 DIAGNOSIS — I82411 Acute embolism and thrombosis of right femoral vein: Secondary | ICD-10-CM

## 2020-07-11 DIAGNOSIS — F411 Generalized anxiety disorder: Secondary | ICD-10-CM

## 2020-07-11 DIAGNOSIS — I152 Hypertension secondary to endocrine disorders: Secondary | ICD-10-CM

## 2020-07-11 DIAGNOSIS — F321 Major depressive disorder, single episode, moderate: Secondary | ICD-10-CM

## 2020-07-11 DIAGNOSIS — E669 Obesity, unspecified: Secondary | ICD-10-CM

## 2020-07-11 DIAGNOSIS — D696 Thrombocytopenia, unspecified: Secondary | ICD-10-CM

## 2020-07-11 DIAGNOSIS — F17210 Nicotine dependence, cigarettes, uncomplicated: Secondary | ICD-10-CM

## 2020-07-11 LAB — MICROALBUMIN, URINE WAIVED
Creatinine, Urine Waived: 300 mg/dL (ref 10–300)
Microalb, Ur Waived: 80 mg/L — ABNORMAL HIGH (ref 0–19)

## 2020-07-11 LAB — BAYER DCA HB A1C WAIVED: HB A1C (BAYER DCA - WAIVED): 6.2 % (ref <7.0)

## 2020-07-11 MED ORDER — CLONAZEPAM 0.5 MG PO TABS
ORAL_TABLET | ORAL | 2 refills | Status: DC
Start: 2020-07-11 — End: 2020-12-05

## 2020-07-11 MED ORDER — SILDENAFIL CITRATE 100 MG PO TABS: 50.0000 mg | ORAL_TABLET | Freq: Every day | ORAL | 11 refills | Status: DC | PRN

## 2020-07-11 NOTE — Assessment & Plan Note (Addendum)
Noted on past labs and endorses some neuropathy pain, have advised him to continue supplement daily.  Check level next visit.

## 2020-07-11 NOTE — Assessment & Plan Note (Signed)
Chronic, ongoing.  Denies SI/HI.   Refills sent on Klonopin, refer to anxiety plan.  Have recommended psychiatry visit, he refuses.  Has failed multiple maintenance medications. 

## 2020-07-11 NOTE — Progress Notes (Signed)
BP 122/78   Pulse 84   Temp 97.8 F (36.6 C) (Oral)   Wt 210 lb 9.6 oz (95.5 kg)   SpO2 97%   BMI 27.41 kg/m    Subjective:    Patient ID: Harold Tate, male    DOB: 1965-10-24, 55 y.o.   MRN: [id number]  HPI: Harold Tate is a 55 y.o. male  Chief Complaint  Patient presents with  . Diabetes  . Hyperlipidemia  . Hypertension  . Mood   DIABETES Last A1C 9.1% in December, on Farxiga -- currently he is not taking [place] consistently, but does endorse he does take.  Metformin, even XR, caused diarrhea with doses over once daily.  Has been working on weight loss -- lost 20 pounds since December -- he recently broke up with [employer] per his report.   Hypoglycemic episodes:no Polydipsia/polyuria:no Visual disturbance:no Chest pain:no Paresthesias:no Glucose Monitoring:no Accucheck frequency: occasionally Fasting glucose: 90 range he reports -- 113 the other day Post prandial: Evening: Before meals: Taking Insulin?:no Long acting insulin: Short acting insulin: Blood Pressure Monitoring:not checking Retinal Examination:Not up to Date Foot Exam:Up to Date Pneumovax:Not up to Date Influenza:Not up to Date Aspirin:no  HYPERTENSION / HYPERLIPIDEMIA Taking Losartan 25 MG daily (not taking currently), Crestor 5 MG daily (not taking currently).  Was seen by hematology due to history of DVT with recurrent DVT recently -- he stopped taking Xarelto recently per hematology -- although if a clot returns he is to stay on Xarelto continually.  He endorses difficulty achieving erection, recent testosterone levels normal.  Discussed at length with him various options and risks/benefits.   Satisfied with current treatment?yes Duration of hypertension:chronic BP monitoring frequency:not checking BP range:  BP medication side effects:no Duration of  hyperlipidemia:chronic Cholesterol medication side effects:no Cholesterol supplements: none Medication compliance:good compliance Aspirin:no Recent stressors:no Recurrent headaches:no Visual changes:no Palpitations:no Dyspnea:no Chest pain:no Lower extremity edema:no Dizzy/lightheaded:no  ANXIETY/STRESS Recently started Celexa, but he has not been taking this -- reports he took it twice and did not like way he felt.  Continues on Clonazepam 0.5 MG TID ordered by previous PCP. Has been on this for several years, used to be Xanax but was changed.Pt is aware of risks ofbenzomedication use to include increased sedation, respiratory suppression, falls, extrapyramidal movements, dependence and cardiovascular events. Ptwould like to continue treatment as benefit determined to outweigh risk.Discussed current research with him. He reports in past when he tried to quit taking Xanax 1 MG TID, it became worse and then he broke out in hives.   Has tried Wellbutrin and Prozac in past without success. Wellbutrin caused weird thoughts.  On review of PDMP his last Klonopin fill was 05/11/20, no other recent controlled substance fills.   Duration:stable Anxious mood:no Excessive worrying:no Irritability:no Sweating:no Nausea:no Palpitations:no Hyperventilation:no Panic attacks:no Agoraphobia:no Obscessions/compulsions:no Depressed mood:no Depression screen [facility] 2/9 07/11/2020 05/30/2020 04/26/2020 12/22/2019 06/10/2019  Decreased Interest 0 1 2 0 0  Down, Depressed, Hopeless 0 3 2 0 0  PHQ - 2 Score 0 4 4 0 0  Altered sleeping 0 3 3 1  0  Tired, decreased energy 0 3 3 0 0  Change in appetite 0 0 0 0 0  Feeling bad or failure about yourself  0 1 0 0 0  Trouble concentrating 0 1 1 0 0  Moving slowly or fidgety/restless 0 0 0 2 0  Suicidal thoughts 0 1 0 0 0  PHQ-9 Score 0 13 11 3  0  Difficult doing work/chores - - - -  Not difficult at all   SPASTIC PARAPLEGIA,  HEREDITARY: Currently followed at [facility] by Dr. Werner Lean.  Last visit 05/11/20.  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  This causes discomfort for him, particularly in legs.  Takes Baclofen at this time as needed.  Referral to neuropsychology placed recently -- he has not attended.  He does endorse some weakness overall with this.  Relevant past medical, surgical, family and social history reviewed and updated as indicated. Interim medical history since our last visit reviewed. Allergies and medications reviewed and updated.  Review of Systems  Constitutional: Negative for activity change, diaphoresis, fatigue and fever.  Respiratory: Negative for cough, chest tightness, shortness of breath and wheezing.   Cardiovascular: Negative for chest pain, palpitations and leg swelling.  Gastrointestinal: Negative.   Endocrine: Negative for polydipsia, polyphagia and polyuria.  Neurological: Negative.   Psychiatric/Behavioral: Negative for decreased concentration, self-injury, sleep disturbance and suicidal ideas. The patient is not nervous/anxious.     Per HPI unless specifically indicated above     Objective:    BP 122/78   Pulse 84   Temp 97.8 F (36.6 C) (Oral)   Wt 210 lb 9.6 oz (95.5 kg)   SpO2 97%   BMI 27.41 kg/m   Wt Readings from Last 3 Encounters:  07/11/20 210 lb 9.6 oz (95.5 kg)  05/30/20 227 lb (103 kg)  04/26/20 230 lb 9.6 oz (104.6 kg)    Physical Exam Vitals and nursing note reviewed.  Constitutional:      General: He is awake. He is not in acute distress.    Appearance: He is well-developed. He is obese. He is not ill-appearing.  HENT:     Head: Normocephalic and atraumatic.     Right Ear: Hearing normal. No drainage.     Left Ear: Hearing normal. No drainage.  Eyes:     General: Lids are normal.        Right eye: No discharge.        Left eye: No discharge.     Conjunctiva/sclera: Conjunctivae normal.     Pupils:  Pupils are equal, round, and reactive to light.  Neck:     Thyroid: No thyromegaly.     Vascular: No carotid bruit.  Cardiovascular:     Rate and Rhythm: Normal rate and regular rhythm.     Heart sounds: Normal heart sounds, S1 normal and S2 normal. No murmur heard. No gallop.   Pulmonary:     Effort: Pulmonary effort is normal. No accessory muscle usage or respiratory distress.     Breath sounds: Normal breath sounds.  Abdominal:     General: Bowel sounds are normal.     Palpations: Abdomen is soft.  Musculoskeletal:        General: Normal range of motion.     Cervical back: Normal range of motion and neck supple.     Right lower leg: No edema.     Left lower leg: No edema.  Skin:    General: Skin is warm and dry.     Capillary Refill: Capillary refill takes less than 2 seconds.     Findings: No rash.  Neurological:     Mental Status: He is alert and oriented to person, place, and time.     Deep Tendon Reflexes: Reflexes are normal and symmetric.  Psychiatric:        Attention and Perception: Attention normal.  Mood and Affect: Mood normal.        Speech: Speech normal.        Behavior: Behavior normal. Behavior is cooperative.        Thought Content: Thought content normal.    Results for orders placed or performed in visit on 06/02/20  Antithrombin III  Result Value Ref Range   AntiThromb III Func 85 75 - 120 %  Protein C, total  Result Value Ref Range   Protein C, Total 79 60 - 150 %  Lupus anticoagulant panel  Result Value Ref Range   PTT Lupus Anticoagulant 29.7 0.0 - 51.9 sec   DRVVT 29.1 0.0 - 47.0 sec   Lupus Anticoag Interp Comment:   Cardiolipin antibodies, IgG, IgM, IgA  Result Value Ref Range   Anticardiolipin IgG <9 0 - 14 GPL U/mL   Anticardiolipin IgM 16 (H) 0 - 12 MPL U/mL   Anticardiolipin IgA <9 0 - 11 APL U/mL      Assessment & Plan:   Problem List Items Addressed This Visit      Cardiovascular and Mediastinum   Hypertension  associated with diabetes (HCC)    Chronic, ongoing. BP at goal today on recheck. Recommend he continue Losartan daily for both kidney protection in diabetes and BP control -- that is take this CONSISTENTLY.  Check BP at home a few days a week and document for provider + focus on DASH diet.  CMP today.  Plan to return to office in 3 months.      Relevant Medications   sildenafil (VIAGRA) 100 MG tablet   Other Relevant Orders   Bayer DCA Hb A1c Waived   Microalbumin, Urine Waived   Comprehensive metabolic panel   Right leg DVT (HCC)    At this time is off of Xarelto, per hematology can maintain off at this time, but if repeat DVT then would recommend continuous therapy      Relevant Medications   sildenafil (VIAGRA) 100 MG tablet     Endocrine   Hyperlipidemia associated with type 2 diabetes mellitus (HCC)    Chronic, ongoing.  Continue Crestor at this time, discussed at length.  Lipid panel today.      Relevant Orders   Bayer DCA Hb A1c Waived   Lipid Panel w/o Chol/HDL Ratio   Type 2 diabetes mellitus with obesity (HCC) - Primary    Chronic, ongoing.  A1c today has trended down from 9.1% to now 6.2% with weight loss.  Continue Farxiga 10 MG daily and monitor -- recommend he continue this at this time even though levels improved, could consider trial off this in future. Again highly recommend he check BS at least once a day and document for provider.  Focus on diabetic diet at home, discussed with him -- eats lot of oranges and frosted flakes. Return in 3 months.      Relevant Orders   Bayer DCA Hb A1c Waived   Microalbumin, Urine Waived     Nervous and Auditory   Hereditary spastic paraparesis ([place])    Continue collaboration with [facility].  Baclofen for discomfort as needed and continue collaboration with [facility].        Other   Generalized anxiety disorder (Chronic)    Chronic, ongoing.  Has been on benzo long term started by his previous PCP, recommended trial of SSRI last visit  -- he reports taking Celexa twice and did not like how it made him feel.  Discussed at length risks of  long term benzo use, including risk for dementia.  He is fully aware of risks and wishes to continue, refuses psychiatry visit.  Refill sent #90 plus 2 refills last visit.  Have HIGHLY recommend he not take more then ordered and that he trial a reduction of it, trial BID dosing.  Will have return to office in 3 months.  UDS today, plan on updated contract next visit.      Relevant Medications   clonazePAM (KLONOPIN) 0.5 MG tablet   Nicotine dependence, cigarettes, uncomplicated    I have recommended complete cessation of tobacco use. I have discussed various options available for assistance with tobacco cessation including over the counter methods (Nicotine gum, patch and lozenges). We also discussed prescription options (Chantix, Nicotine Inhaler / Nasal Spray). The patient is not interested in pursuing any prescription tobacco cessation options at this time.  Refuses lung screening at this time.       Depression, major, single episode, moderate (HCC)    Chronic, ongoing.  Denies SI/HI.   Refills sent on Klonopin, refer to anxiety plan.  Have recommended psychiatry visit, he refuses.  Has failed multiple maintenance medications.      Long-term current use of benzodiazepine    Chronic, ongoing.  Has been on benzo long term.  Discussed at length risks of long term benzo use.  He is aware of risks.  Refill sent #90 plus 2 refills last visit.  UDS today.  Will refer to psychiatry if any increased doses requested, he refuses visit with them at this time.      Relevant Orders   [id number] 11+Oxyco+Alc+Crt-Bund   B12 deficiency    Noted on past labs and endorses some neuropathy pain, have advised him to continue supplement daily.  Check level next visit.      Thrombocytopenia ([place])    Noted on past labs since 2020, ranging 111 to 146.  ?related to past heavier alcohol intake.  CBC up to date with  hematology.  Recommend continued cessation of alcohol use.  Recheck CBC next visit.          Follow up plan: Return in about 3 months (around 10/11/2020) for T2DM, HTN/HLD, MOOD.

## 2020-07-11 NOTE — Assessment & Plan Note (Signed)
At this time is off of Xarelto, per hematology can maintain off at this time, but if repeat DVT then would recommend continuous therapy

## 2020-07-11 NOTE — Assessment & Plan Note (Signed)
Chronic, ongoing. BP at goal today on recheck. Recommend he continue Losartan daily for both kidney protection in diabetes and BP control -- that is take this CONSISTENTLY.  Check BP at home a few days a week and document for provider + focus on DASH diet.  CMP today.  Plan to return to office in 3 months.

## 2020-07-11 NOTE — Assessment & Plan Note (Signed)
Noted on past labs since 2020, ranging 111 to 146.  ?related to past heavier alcohol intake.  CBC up to date with hematology.  Recommend continued cessation of alcohol use.  Recheck CBC next visit.

## 2020-07-11 NOTE — Assessment & Plan Note (Signed)
Chronic, ongoing.  Has been on benzo long term.  Discussed at length risks of long term benzo use.  He is aware of risks.  Refill sent #90 plus 2 refills last visit.  UDS today.  Will refer to psychiatry if any increased doses requested, he refuses visit with them at this time.

## 2020-07-11 NOTE — Assessment & Plan Note (Signed)
I have recommended complete cessation of tobacco use. I have discussed various options available for assistance with tobacco cessation including over the counter methods (Nicotine gum, patch and lozenges). We also discussed prescription options (Chantix, Nicotine Inhaler / Nasal Spray). The patient is not interested in pursuing any prescription tobacco cessation options at this time.  Refuses lung screening at this time.

## 2020-07-11 NOTE — Assessment & Plan Note (Signed)
Chronic, ongoing.  Continue Crestor at this time, discussed at length.  Lipid panel today. 

## 2020-07-11 NOTE — Patient Instructions (Signed)
Diabetes Mellitus and Nutrition, Adult When you have diabetes, or diabetes mellitus, it is very important to have healthy eating habits because your blood sugar (glucose) levels are greatly affected by what you eat and drink. Eating healthy foods in the right amounts, at about the same times every day, can help you:  Control your blood glucose.  Lower your risk of heart disease.  Improve your blood pressure.  Reach or maintain a healthy weight. What can affect my meal plan? Every person with diabetes is different, and each person has different needs for a meal plan. Your health care provider may recommend that you work with a dietitian to make a meal plan that is best for you. Your meal plan may vary depending on factors such as:  The calories you need.  The medicines you take.  Your weight.  Your blood glucose, blood pressure, and cholesterol levels.  Your activity level.  Other health conditions you have, such as heart or kidney disease. How do carbohydrates affect me? Carbohydrates, also called carbs, affect your blood glucose level more than any other type of food. Eating carbs naturally raises the amount of glucose in your blood. Carb counting is a method for keeping track of how many carbs you eat. Counting carbs is important to keep your blood glucose at a healthy level, especially if you use insulin or take certain oral diabetes medicines. It is important to know how many carbs you can safely have in each meal. This is different for every person. Your dietitian can help you calculate how many carbs you should have at each meal and for each snack. How does alcohol affect me? Alcohol can cause a sudden decrease in blood glucose (hypoglycemia), especially if you use insulin or take certain oral diabetes medicines. Hypoglycemia can be a life-threatening condition. Symptoms of hypoglycemia, such as sleepiness, dizziness, and confusion, are similar to symptoms of having too much  alcohol.  Do not drink alcohol if: ? Your health care provider tells you not to drink. ? You are pregnant, may be pregnant, or are planning to become pregnant.  If you drink alcohol: ? Do not drink on an empty stomach. ? Limit how much you use to:  0-1 drink a day for women.  0-2 drinks a day for men. ? Be aware of how much alcohol is in your drink. In the [place] one drink equals one 12 oz bottle of beer (355 mL), one 5 oz glass of wine (148 mL), or one 1 oz glass of hard liquor (44 mL). ? Keep yourself hydrated with water, diet soda, or unsweetened iced tea.  Keep in mind that regular soda, juice, and other mixers may contain a lot of sugar and must be counted as carbs. What are tips for following this plan? Reading food labels  Start by checking the serving size on the "Nutrition Facts" label of packaged foods and drinks. The amount of calories, carbs, fats, and other nutrients listed on the label is based on one serving of the item. Many items contain more than one serving per package.  Check the total grams (g) of carbs in one serving. You can calculate the number of servings of carbs in one serving by dividing the total carbs by 15. For example, if a food has 30 g of total carbs per serving, it would be equal to 2 servings of carbs.  Check the number of grams (g) of saturated fats and trans fats in one serving. Choose foods that have   a low amount or none of these fats.  Check the number of milligrams (mg) of salt (sodium) in one serving. Most people should limit total sodium intake to less than 2,300 mg per day.  Always check the nutrition information of foods labeled as "low-fat" or "nonfat." These foods may be higher in added sugar or refined carbs and should be avoided.  Talk to your dietitian to identify your daily goals for nutrients listed on the label. Shopping  Avoid buying canned, pre-made, or processed foods. These foods tend to be high in fat, sodium, and added  sugar.  Shop around the outside edge of the grocery store. This is where you will most often find fresh fruits and vegetables, bulk grains, fresh meats, and fresh dairy. Cooking  Use low-heat cooking methods, such as baking, instead of high-heat cooking methods like deep frying.  Cook using healthy oils, such as olive, canola, or sunflower oil.  Avoid cooking with butter, cream, or high-fat meats. Meal planning  Eat meals and snacks regularly, preferably at the same times every day. Avoid going long periods of time without eating.  Eat foods that are high in fiber, such as fresh fruits, vegetables, beans, and whole grains. Talk with your dietitian about how many servings of carbs you can eat at each meal.  Eat 4-6 oz (112-168 g) of lean protein each day, such as lean meat, chicken, fish, eggs, or tofu. One ounce (oz) of lean protein is equal to: ? 1 oz (28 g) of meat, chicken, or fish. ? 1 egg. ?  cup (62 g) of tofu.  Eat some foods each day that contain healthy fats, such as avocado, nuts, seeds, and fish.   What foods should I eat? Fruits Berries. Apples. Oranges. Peaches. Apricots. Plums. Grapes. Mango. Papaya. Pomegranate. Kiwi. Cherries. Vegetables Lettuce. Spinach. Leafy greens, including kale, chard, collard greens, and mustard greens. Beets. Cauliflower. Cabbage. Broccoli. Carrots. Green beans. Tomatoes. Peppers. Onions. Cucumbers. Brussels sprouts. Grains Whole grains, such as whole-wheat or whole-grain bread, crackers, tortillas, cereal, and pasta. Unsweetened oatmeal. Quinoa. Brown or wild rice. Meats and other proteins Seafood. Poultry without skin. Lean cuts of poultry and beef. Tofu. Nuts. Seeds. Dairy Low-fat or fat-free dairy products such as milk, yogurt, and cheese. The items listed above may not be a complete list of foods and beverages you can eat. Contact a dietitian for more information. What foods should I avoid? Fruits Fruits canned with  syrup. Vegetables Canned vegetables. Frozen vegetables with butter or cream sauce. Grains Refined white flour and flour products such as bread, pasta, snack foods, and cereals. Avoid all processed foods. Meats and other proteins Fatty cuts of meat. Poultry with skin. Breaded or fried meats. Processed meat. Avoid saturated fats. Dairy Full-fat yogurt, cheese, or milk. Beverages Sweetened drinks, such as soda or iced tea. The items listed above may not be a complete list of foods and beverages you should avoid. Contact a dietitian for more information. Questions to ask a health care provider  Do I need to meet with a diabetes educator?  Do I need to meet with a dietitian?  What number can I call if I have questions?  When are the best times to check my blood glucose? Where to find more information:  American Diabetes Association: diabetes.org  Academy of Nutrition and Dietetics: www.eatright.org  National Institute of Diabetes and Digestive and Kidney Diseases: www.niddk.nih.gov  Association of Diabetes Care and Education Specialists: www.diabeteseducator.org Summary  It is important to have healthy eating   habits because your blood sugar (glucose) levels are greatly affected by what you eat and drink.  A healthy meal plan will help you control your blood glucose and maintain a healthy lifestyle.  Your health care provider may recommend that you work with a dietitian to make a meal plan that is best for you.  Keep in mind that carbohydrates (carbs) and alcohol have immediate effects on your blood glucose levels. It is important to count carbs and to use alcohol carefully. This information is not intended to replace advice given to you by your health care provider. Make sure you discuss any questions you have with your health care provider. Document Revised: 03/23/2019 Document Reviewed: 03/23/2019 Elsevier Patient Education  2021 [employer].  

## 2020-07-11 NOTE — Assessment & Plan Note (Addendum)
Chronic, ongoing.  Has been on benzo long term started by his previous PCP, recommended trial of SSRI last visit -- he reports taking Celexa twice and did not like how it made him feel.  Discussed at length risks of long term benzo use, including risk for dementia.  He is fully aware of risks and wishes to continue, refuses psychiatry visit.  Refill sent #90 plus 2 refills last visit.  Have HIGHLY recommend he not take more then ordered and that he trial a reduction of it, trial BID dosing.  Will have return to office in 3 months.  UDS today, plan on updated contract next visit.

## 2020-07-11 NOTE — Assessment & Plan Note (Addendum)
Continue collaboration with [facility].  Baclofen for discomfort as needed and continue collaboration with [facility]. 

## 2020-07-11 NOTE — Assessment & Plan Note (Signed)
Chronic, ongoing.  A1c today has trended down from 9.1% to now 6.2% with weight loss.  Continue Farxiga 10 MG daily and monitor -- recommend he continue this at this time even though levels improved, could consider trial off this in future. Again highly recommend he check BS at least once a day and document for provider.  Focus on diabetic diet at home, discussed with him -- eats lot of oranges and frosted flakes. Return in 3 months.

## 2020-07-12 LAB — LIPID PANEL W/O CHOL/HDL RATIO
Cholesterol, Total: 165 mg/dL (ref 100–199)
HDL: 44 mg/dL (ref >39)
LDL Chol Calc (NIH): 99 mg/dL (ref 0–99)
Triglycerides: 122 mg/dL (ref 0–149)
VLDL Cholesterol Cal: 22 mg/dL (ref 5–40)

## 2020-07-12 LAB — DRUG SCREEN 764883 11+OXYCO+ALC+CRT-BUND
Amphetamines, Urine: NEGATIVE ng/mL
BENZODIAZ UR QL: NEGATIVE ng/mL
Barbiturate: NEGATIVE ng/mL
Cannabinoid Quant, Ur: NEGATIVE ng/mL
Cocaine (Metabolite): NEGATIVE ng/mL
Creatinine: 152.7 mg/dL (ref 20.0–300.0)
Ethanol: NEGATIVE %
Meperidine: NEGATIVE ng/mL
Methadone Screen, Urine: NEGATIVE ng/mL
OPIATE SCREEN URINE: NEGATIVE ng/mL
Oxycodone/Oxymorphone, Urine: NEGATIVE ng/mL
Phencyclidine: NEGATIVE ng/mL
Propoxyphene: NEGATIVE ng/mL
Tramadol: NEGATIVE ng/mL
pH, Urine: 6.3 (ref 4.5–8.9)

## 2020-07-12 LAB — COMPREHENSIVE METABOLIC PANEL
ALT: 22 IU/L (ref 0–44)
AST: 29 IU/L (ref 0–40)
Albumin/Globulin Ratio: 1.1 — ABNORMAL LOW (ref 1.2–2.2)
Albumin: 4 g/dL (ref 3.8–4.9)
Alkaline Phosphatase: 138 IU/L — ABNORMAL HIGH (ref 44–121)
BUN/Creatinine Ratio: 6 — ABNORMAL LOW (ref 9–20)
BUN: 5 mg/dL — ABNORMAL LOW (ref 6–24)
Bilirubin Total: 0.5 mg/dL (ref 0.0–1.2)
CO2: 20 mmol/L (ref 20–29)
Calcium: 9.3 mg/dL (ref 8.7–10.2)
Chloride: 102 mmol/L (ref 96–106)
Creatinine, Ser: 0.86 mg/dL (ref 0.76–1.27)
Globulin, Total: 3.5 g/dL (ref 1.5–4.5)
Glucose: 113 mg/dL — ABNORMAL HIGH (ref 65–99)
Potassium: 4.4 mmol/L (ref 3.5–5.2)
Sodium: 140 mmol/L (ref 134–144)
Total Protein: 7.5 g/dL (ref 6.0–8.5)
eGFR: 103 mL/min/{1.73_m2} (ref >59)

## 2020-07-12 NOTE — Progress Notes (Signed)
Contacted via [place] afternoon [place], your labs have returned.  Kidney function and liver function remain stable -- there is mild elevation in Alkaline phosphatase -- sometimes we see this with gall bladder and we will continue to monitor.  Cholesterol levels look better, continue diet changes and all current medications.  Any questions? Keep being awesome!!  Thank you for allowing me to participate in your care. Kindest regards, Kenji Mapel

## 2020-07-19 ENCOUNTER — Telehealth: Payer: Self-pay

## 2020-07-19 NOTE — Telephone Encounter (Signed)
Copied from [place] [id number]. Topic: Referral - Status >> Jul 19, 2020 11:00 AM Erick Blinks wrote: [facility]. Patient never answered calls. They are closing his case for now.  FYI

## 2020-08-14 ENCOUNTER — Telehealth: Payer: Self-pay

## 2020-08-14 NOTE — Telephone Encounter (Signed)
Copied from [place] [id number]. Topic: General - Other >> Aug 14, 2020  2:35 PM Leward Quan A wrote: Reason for CRM: Patient called in to inquire if Dr want him  to continue taking the dapagliflozin propanediol (FARXIGA) 10 MG TABS tablet. Per patient say that he was told more than a month ago to take for one more month and that month is now over would like a call back to say if he should refill medication or stop taking it. Please call Ph# [phone]  Would pt need an apt?

## 2020-08-16 ENCOUNTER — Other Ambulatory Visit: Payer: Self-pay | Admitting: Nurse Practitioner

## 2020-08-16 ENCOUNTER — Ambulatory Visit (INDEPENDENT_AMBULATORY_CARE_PROVIDER_SITE_OTHER): Payer: 59 | Admitting: Internal Medicine

## 2020-08-16 ENCOUNTER — Other Ambulatory Visit: Payer: Self-pay

## 2020-08-16 ENCOUNTER — Encounter: Payer: Self-pay | Admitting: Internal Medicine

## 2020-08-16 VITALS — BP 129/83 | HR 77 | Temp 98.1°F | Ht 71.65 in | Wt 198.6 lb

## 2020-08-16 DIAGNOSIS — R634 Abnormal weight loss: Secondary | ICD-10-CM | POA: Diagnosis not present

## 2020-08-16 DIAGNOSIS — Z122 Encounter for screening for malignant neoplasm of respiratory organs: Secondary | ICD-10-CM | POA: Diagnosis not present

## 2020-08-16 DIAGNOSIS — Z1211 Encounter for screening for malignant neoplasm of colon: Secondary | ICD-10-CM | POA: Diagnosis not present

## 2020-08-16 LAB — MICROSCOPIC EXAMINATION: Bacteria, UA: NONE SEEN

## 2020-08-16 LAB — BAYER DCA HB A1C WAIVED: HB A1C (BAYER DCA - WAIVED): 5.8 % (ref <7.0)

## 2020-08-16 LAB — URINALYSIS, ROUTINE W REFLEX MICROSCOPIC
Bilirubin, UA: NEGATIVE
Leukocytes,UA: NEGATIVE
Nitrite, UA: NEGATIVE
RBC, UA: NEGATIVE
Specific Gravity, UA: >1.03 — ABNORMAL HIGH (ref 1.005–1.030)
Urobilinogen, Ur: 1 mg/dL (ref 0.2–1.0)
pH, UA: 6 (ref 5.0–7.5)

## 2020-08-16 MED ORDER — DAPAGLIFLOZIN PROPANEDIOL 10 MG PO TABS: 10.0000 mg | ORAL_TABLET | Freq: Every day | ORAL | 4 refills | Status: DC

## 2020-08-16 NOTE — Telephone Encounter (Signed)
Yes, continue at this time until his appointment with Dr. Neomia Dear in June, if A1c continues to trend down with his weight loss and diet changes then we could consider trial off of this and return to diet focus.  For now continue though until next visit and labs.

## 2020-08-16 NOTE — Telephone Encounter (Signed)
Patient notified of Dr.Jolene's recommendations and patient is here in office for an appointment with Dr.Vigg today 08/16/20.

## 2020-08-16 NOTE — Progress Notes (Addendum)
BP 129/83   Pulse 77   Temp 98.1 F (36.7 C) (Oral)   Ht 5' 11.65" (1.82 m)   Wt 198 lb 9.6 oz (90.1 kg)   SpO2 98%   BMI 27.20 kg/m    Subjective:    Patient ID: Harold Tate, male    DOB: [birth year], 55 y.o.   MRN: [id number]  HPI: Harold Tate is a 55 y.o. 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  Drank bud light in the past - drank 2-3 beers a day when he was working in the yard. Could drink about 18 beers a day.  Has irregular stools he says he drinks coffee all day long and doesn't put much fiber in his diet. Has FH of lung cancer - SCC 2 uncles and 2 aunts.  Pt is a current smoker.  Diabetes He presents for his follow-up diabetic visit. He has type 2 diabetes mellitus. Associated symptoms include fatigue and weight loss. Pertinent negatives for diabetes include no blurred vision, no chest pain, no foot paresthesias, no foot ulcerations, no polydipsia, no polyphagia, no polyuria, no visual change and no weakness.  Nicotine Dependence Presents for initial visit. Symptoms include fatigue. His urge triggers include company of smokers. He smokes 2 packs of cigarettes per day. Drevon is thinking about quitting.    Chief Complaint  Patient presents with  . Losing muscle mass    Patient states he has a dx of of spastic paraparesis  . Low energy  . Diabetes    Relevant past medical, surgical, family and social history reviewed and updated as indicated. Interim medical history since our last visit reviewed. Allergies and medications reviewed and updated.  Review of Systems  Constitutional: Positive for fatigue and weight loss.  Eyes: Negative for blurred vision.  Cardiovascular: Negative for chest pain.  Endocrine: Negative for polydipsia, polyphagia and polyuria.  Neurological: Negative for  weakness.    Per HPI unless specifically indicated above     Objective:    BP 129/83   Pulse 77   Temp 98.1 F (36.7 C) (Oral)   Ht 5' 11.65" (1.82 m)   Wt 198 lb 9.6 oz (90.1 kg)   SpO2 98%   BMI 27.20 kg/m   Wt Readings from Last 3 Encounters:  08/16/20 198 lb 9.6 oz (90.1 kg)  07/11/20 210 lb 9.6 oz (95.5 kg)  05/30/20 227 lb (103 kg)    Physical Exam Vitals and nursing note reviewed.  Constitutional:      General: He is not in acute distress.    Appearance: Normal appearance. He is not ill-appearing or diaphoretic.  HENT:     Head: Normocephalic and atraumatic.     Right Ear: Tympanic membrane and external ear normal. There is no impacted cerumen.     Left Ear: External ear normal.     Nose: No congestion or rhinorrhea.     Mouth/Throat:     Pharynx: No oropharyngeal exudate or posterior oropharyngeal erythema.  Eyes:     Conjunctiva/sclera: Conjunctivae normal.     Pupils: Pupils are equal, round, and reactive to light.  Cardiovascular:     Rate and Rhythm: Normal rate and regular rhythm.     Heart sounds: No murmur heard. No friction rub. No gallop.  Pulmonary:     Effort: No respiratory distress.     Breath sounds: No stridor. No wheezing or rhonchi.  Chest:     Chest wall: No tenderness.  Abdominal:     General: Abdomen is flat. Bowel sounds are normal.     Palpations: Abdomen is soft. There is no mass.     Tenderness: There is no abdominal tenderness.  Musculoskeletal:     Cervical back: Normal range of motion and neck supple. No rigidity or tenderness.     Left lower leg: No edema.  Skin:    General: Skin is warm and dry.  Neurological:     Mental Status: He is alert.     Cranial Nerves: No cranial nerve deficit.     Results for orders placed or performed in visit on 07/11/20  Bayer DCA Hb A1c Waived  Result Value Ref Range   HB A1C (BAYER DCA - WAIVED) 6.2 <7.0 %  Microalbumin, Urine Waived  Result Value Ref Range   Microalb, Ur Waived 80  (H) 0 - 19 mg/L   Creatinine, Urine Waived 300 10 - 300 mg/dL   Microalb/Creat Ratio 30-300 (H) <30 mg/g  Comprehensive metabolic panel  Result Value Ref Range   Glucose 113 (H) 65 - 99 mg/dL   BUN 5 (L) 6 - 24 mg/dL   Creatinine, Ser 0.86 0.76 - 1.27 mg/dL   eGFR 103 >59 mL/min/1.73   BUN/Creatinine Ratio 6 (L) 9 - 20   Sodium 140 134 - 144 mmol/L   Potassium 4.4 3.5 - 5.2 mmol/L   Chloride 102 96 - 106 mmol/L   CO2 20 20 - 29 mmol/L   Calcium 9.3 8.7 - 10.2 mg/dL   Total Protein 7.5 6.0 - 8.5 g/dL   Albumin 4.0 3.8 - 4.9 g/dL   Globulin, Total 3.5 1.5 - 4.5 g/dL   Albumin/Globulin Ratio 1.1 (L) 1.2 - 2.2   Bilirubin Total 0.5 0.0 - 1.2 mg/dL   Alkaline Phosphatase 138 (H) 44 - 121 IU/L   AST 29 0 - 40 IU/L   ALT 22 0 - 44 IU/L  Lipid Panel w/o Chol/HDL Ratio  Result Value Ref Range   Cholesterol, Total 165 100 - 199 mg/dL   Triglycerides 122 0 - 149 mg/dL   HDL 44 >39 mg/dL   VLDL Cholesterol Cal 22 5 - 40 mg/dL   LDL Chol Calc (NIH) 99 0 - 99 mg/dL  [id number] 11+Oxyco+Alc+Crt-Bund  Result Value Ref Range   Ethanol Negative Cutoff=0.020 %   Amphetamines, Urine Negative Cutoff=1000 ng/mL   Barbiturate Negative Cutoff=200 ng/mL   BENZODIAZ UR QL Negative Cutoff=200 ng/mL   Cannabinoid Quant, Ur Negative Cutoff=50 ng/mL   Cocaine (Metabolite) Negative Cutoff=300 ng/mL   OPIATE SCREEN URINE Negative Cutoff=300 ng/mL   Oxycodone/Oxymorphone, Urine Negative Cutoff=300 ng/mL   Phencyclidine Negative Cutoff=25 ng/mL   Methadone Screen, Urine Negative Cutoff=300 ng/mL   Propoxyphene Negative Cutoff=300 ng/mL   Meperidine Negative Cutoff=200 ng/mL   Tramadol Negative Cutoff=200 ng/mL   Creatinine 152.7 20.0 - 300.0 mg/dL   pH, Urine 6.3 4.5 - 8.9        Current Outpatient Medications:  .  clonazePAM (KLONOPIN) 0.5 MG tablet, Take 1 tablet (0.5 mg total) by mouth 3 (three) times daily as needed for anxiety., Disp: 90 tablet, Rfl: 2 .  dapagliflozin propanediol (FARXIGA) 10 MG  TABS tablet, Take 1 tablet (10 mg total) by mouth daily before breakfast., Disp: 90 tablet, Rfl: 4 .  vitamin B-12 (  CYANOCOBALAMIN) 1000 MCG tablet, Take 1 tablet (1,000 mcg total) by mouth daily., Disp: 90 tablet, Rfl: 4 .  baclofen (LIORESAL) 10 MG tablet, Take 1 tablet (10 mg total) by mouth as needed for muscle spasms (three times a day as needed for muscle spasms). (Patient not taking: Reported on 08/16/2020), Disp: 270 each, Rfl: 4 .  losartan (COZAAR) 25 MG tablet, Take 1 tablet (25 mg total) by mouth daily. (Patient not taking: Reported on 08/16/2020), Disp: 90 tablet, Rfl: 3 .  rosuvastatin (CRESTOR) 5 MG tablet, Take 1 tablet (5 mg total) by mouth daily. (Patient not taking: Reported on 08/16/2020), Disp: 90 tablet, Rfl: 4 .  sildenafil (VIAGRA) 100 MG tablet, Take 0.5-1 tablets (50-100 mg total) by mouth daily as needed for erectile dysfunction. (Patient not taking: Reported on 08/16/2020), Disp: 5 tablet, Rfl: 11    Assessment & Plan:  1. Fatigue / wt loss : eats what he wants, eats sweets  Hold farxiga recheck in 2- 3 weeks.  Ct of abdomen / chest   Cscope ordered as will URGENT referral made to GI  Check Hep C . DECLINES HIV. HOLD FARXIGA  UA : SHOWS 3+ glucose .    Ref. Range 08/16/2020 15:51  URINALYSIS, ROUTINE W REFLEX MICROSCOPIC Unknown Rpt (A)  Appearance Ur Latest Ref Range: Clear  Clear  Bilirubin, UA Latest Ref Range: Negative  Negative  Color, UA Latest Ref Range: Yellow  Yellow  Glucose, UA Latest Ref Range: Negative  3+ (A)  Ketones, UA Latest Ref Range: Negative  Trace (A)  Leukocytes,UA Latest Ref Range: Negative  Negative  Nitrite, UA Latest Ref Range: Negative  Negative  pH, UA Latest Ref Range: 5.0 - 7.5  6.0  Protein,UA Latest Ref Range: Negative/Trace  2+ (A)  Specific Gravity, UA Latest Ref Range: 1.005 - 1.030  >1.030 (H)   Orders Placed This Encounter  Procedures  . Microscopic Examination  . CT Abdomen Pelvis W Contrast    Order Specific Question:    If indicated for the ordered procedure, I authorize the administration of contrast media per Radiology protocol    Answer:   Yes    Order Specific Question:   Preferred imaging location?    Answer:   [facility]    Order Specific Question:   Is Oral Contrast requested for this exam?    Answer:   Yes, Per Radiology protocol  . CT Chest W Contrast    Standing Status:   Future    Standing Expiration Date:   08/16/2021    Order Specific Question:   If indicated for the ordered procedure, I authorize the administration of contrast media per Radiology protocol    Answer:   Yes    Order Specific Question:   Preferred imaging location?    Answer:   [facility]  . Bayer DCA Hb A1c Waived  . Basic metabolic panel  . Urinalysis, Routine w reflex microscopic  . CBC with Differential/Platelet  . Hepatitis C antibody  . ANA w/Reflex if Positive  . Ambulatory referral to Gastroenterology    Referral Priority:   Urgent    Referral Type:   Consultation    Referral Reason:   Specialty Services Required    Number of Visits Requested:   1    Problem List Items Addressed This Visit   None   Visit Diagnoses    Screening for lung cancer    -  Primary   Screen for colon cancer  Relevant Orders   Ambulatory referral to Gastroenterology   Weight loss       Relevant Orders   Bayer DCA Hb A1c Waived (Completed)   Basic metabolic panel (Completed)   Urinalysis, Routine w reflex microscopic (Completed)   CBC with Differential/Platelet (Completed)   Hepatitis C antibody (Completed)   ANA w/Reflex if Positive (Completed)   CT Abdomen Pelvis W Contrast   CT Chest W Contrast      Follow up plan: No follow-ups on file.  9:02 AM August 17, 2020 discussed with patient to hold [place] he did take it this morning however told him that his A1c was down to 5.8 and [place] does cause loss of calories and could contribute some to his weight loss handhold for now until we get results of his CT  scans.  Patient to have a pan CT of his abdomen pelvis and chest to look for causes of acute weight loss which was unintentional. C-scope ordered ASAP.

## 2020-08-17 ENCOUNTER — Ambulatory Visit: Payer: 59 | Admitting: Internal Medicine

## 2020-08-17 ENCOUNTER — Ambulatory Visit
Admission: RE | Admit: 2020-08-17 | Discharge: 2020-08-17 | Disposition: A | Discharge status: Home / Self Care | Payer: 59 | Source: Ambulatory Visit | Attending: Internal Medicine | Admitting: Internal Medicine

## 2020-08-17 ENCOUNTER — Telehealth: Payer: Self-pay

## 2020-08-17 DIAGNOSIS — R634 Abnormal weight loss: Secondary | ICD-10-CM | POA: Diagnosis present

## 2020-08-17 LAB — BASIC METABOLIC PANEL
BUN/Creatinine Ratio: 9 (ref 9–20)
BUN: 7 mg/dL (ref 6–24)
CO2: 21 mmol/L (ref 20–29)
Calcium: 9.4 mg/dL (ref 8.7–10.2)
Chloride: 98 mmol/L (ref 96–106)
Creatinine, Ser: 0.8 mg/dL (ref 0.76–1.27)
Glucose: 81 mg/dL (ref 65–99)
Potassium: 4 mmol/L (ref 3.5–5.2)
Sodium: 139 mmol/L (ref 134–144)
eGFR: 105 mL/min/{1.73_m2} (ref >59)

## 2020-08-17 LAB — CBC WITH DIFFERENTIAL/PLATELET
Basophils Absolute: 0.1 10*3/uL (ref 0.0–0.2)
Basos: 2 %
EOS (ABSOLUTE): 0.2 10*3/uL (ref 0.0–0.4)
Eos: 4 %
Hematocrit: 46.4 % (ref 37.5–51.0)
Hemoglobin: 16 g/dL (ref 13.0–17.7)
Immature Grans (Abs): 0 10*3/uL (ref 0.0–0.1)
Immature Granulocytes: 0 %
Lymphocytes Absolute: 1.7 10*3/uL (ref 0.7–3.1)
Lymphs: 26 %
MCH: 32.8 pg (ref 26.6–33.0)
MCHC: 34.5 g/dL (ref 31.5–35.7)
MCV: 95 fL (ref 79–97)
Monocytes Absolute: 0.7 10*3/uL (ref 0.1–0.9)
Monocytes: 10 %
Neutrophils Absolute: 3.9 10*3/uL (ref 1.4–7.0)
Neutrophils: 58 %
Platelets: 169 10*3/uL (ref 150–450)
RBC: 4.88 x10E6/uL (ref 4.14–5.80)
RDW: 12.2 % (ref 11.6–15.4)
WBC: 6.6 10*3/uL (ref 3.4–10.8)

## 2020-08-17 LAB — ANA W/REFLEX IF POSITIVE: Anti Nuclear Antibody (ANA): NEGATIVE

## 2020-08-17 LAB — HEPATITIS C ANTIBODY: Hep C Virus Ab: <0.1 s/co ratio (ref 0.0–0.9)

## 2020-08-17 MED ORDER — IOHEXOL 300 MG/ML  SOLN
100.0000 mL | Freq: Once | INTRAMUSCULAR | Status: AC | PRN
  Administered 2020-08-17: 100 mL via INTRAVENOUS

## 2020-08-17 NOTE — Telephone Encounter (Signed)
Pt called back and cancelled his nurse visit for today 4/21, pt was not sure why he needed this appt, pt stated he cannot make it today and if he still needs this nurse visit to please call him back to re-schedule.

## 2020-08-17 NOTE — Telephone Encounter (Signed)
Copied from [place] [id number]. Topic: General - Other >> Aug 17, 2020  9:29 AM Harold Tate wrote: Reason for CRM: Pt is calling back to see where and what time his abd ct scan is today. Pt said dr Neomia Dear told him to callback if he has not heard from someone by 3 pm

## 2020-08-17 NOTE — Telephone Encounter (Signed)
Set up appt back for peer to peer advised to pt

## 2020-08-17 NOTE — Progress Notes (Deleted)
Peer to peer approved - on phone call for 15 mins with Insurance / [employer] for peer to peer to clear CT - chest / abdomen/ Pelvis for unintentional weight loss.  Order Number is [id number] valid between April 21st and may 20th  Screening for occult malignancy.

## 2020-08-17 NOTE — Telephone Encounter (Signed)
Spoke with the referral coordinator, a peer to peer review is scheduled for 3pm today to see if they can get it approved.  Patient notified.

## 2020-08-18 ENCOUNTER — Other Ambulatory Visit: Payer: Self-pay

## 2020-08-18 ENCOUNTER — Other Ambulatory Visit: Payer: Self-pay | Admitting: Internal Medicine

## 2020-08-18 ENCOUNTER — Other Ambulatory Visit: Payer: 59

## 2020-08-18 DIAGNOSIS — K7030 Alcoholic cirrhosis of liver without ascites: Secondary | ICD-10-CM

## 2020-08-18 DIAGNOSIS — R911 Solitary pulmonary nodule: Secondary | ICD-10-CM

## 2020-08-18 NOTE — Progress Notes (Signed)
Reviewed results of CT Abdomen/ Pelvis / Chest from yesterday   Per chart review pt has an extensive h.o alcohol abuse Noted cirrhosis on CT abdomen. Will refer to GI asap. Check Bilirubin/ ac hepatitis panel willrefer to pulm for 4 m lung nodule and 28 lbs wt loss.  Florice Hindle

## 2020-08-19 LAB — COMPREHENSIVE METABOLIC PANEL
ALT: 16 IU/L (ref 0–44)
AST: 17 IU/L (ref 0–40)
Albumin/Globulin Ratio: 1.3 (ref 1.2–2.2)
Albumin: 4.2 g/dL (ref 3.8–4.9)
Alkaline Phosphatase: 123 IU/L — ABNORMAL HIGH (ref 44–121)
BUN/Creatinine Ratio: 8 — ABNORMAL LOW (ref 9–20)
BUN: 7 mg/dL (ref 6–24)
Bilirubin Total: 0.8 mg/dL (ref 0.0–1.2)
CO2: 21 mmol/L (ref 20–29)
Calcium: 9.6 mg/dL (ref 8.7–10.2)
Chloride: 100 mmol/L (ref 96–106)
Creatinine, Ser: 0.86 mg/dL (ref 0.76–1.27)
Globulin, Total: 3.2 g/dL (ref 1.5–4.5)
Glucose: 96 mg/dL (ref 65–99)
Potassium: 4.6 mmol/L (ref 3.5–5.2)
Sodium: 139 mmol/L (ref 134–144)
Total Protein: 7.4 g/dL (ref 6.0–8.5)
eGFR: 103 mL/min/{1.73_m2} (ref >59)

## 2020-08-19 LAB — BILIRUBIN, DIRECT: Bilirubin, Direct: 0.18 mg/dL (ref 0.00–0.40)

## 2020-08-19 LAB — ACUTE HEP PANEL AND HEP B SURFACE AB
Hep A IgM: NEGATIVE
Hep B C IgM: NEGATIVE
Hep C Virus Ab: <0.1 s/co ratio (ref 0.0–0.9)
Hepatitis B Surf Ab Quant: <3.1 m[IU]/mL — ABNORMAL LOW (ref >9.9)
Hepatitis B Surface Ag: NEGATIVE

## 2020-08-24 DIAGNOSIS — K766 Portal hypertension: Secondary | ICD-10-CM | POA: Insufficient documentation

## 2020-08-24 DIAGNOSIS — K7030 Alcoholic cirrhosis of liver without ascites: Secondary | ICD-10-CM | POA: Insufficient documentation

## 2020-08-24 DIAGNOSIS — Z8371 Family history of colonic polyps: Secondary | ICD-10-CM | POA: Insufficient documentation

## 2020-08-28 ENCOUNTER — Ambulatory Visit (INDEPENDENT_AMBULATORY_CARE_PROVIDER_SITE_OTHER): Payer: 59 | Admitting: Internal Medicine

## 2020-08-28 ENCOUNTER — Encounter: Payer: Self-pay | Admitting: Internal Medicine

## 2020-08-28 ENCOUNTER — Other Ambulatory Visit: Payer: Self-pay

## 2020-08-28 VITALS — BP 125/82 | HR 97 | Temp 99.4°F | Ht 71.65 in | Wt 195.8 lb

## 2020-08-28 DIAGNOSIS — E785 Hyperlipidemia, unspecified: Secondary | ICD-10-CM | POA: Diagnosis not present

## 2020-08-28 DIAGNOSIS — K7030 Alcoholic cirrhosis of liver without ascites: Secondary | ICD-10-CM

## 2020-08-28 DIAGNOSIS — E1169 Type 2 diabetes mellitus with other specified complication: Secondary | ICD-10-CM

## 2020-08-28 DIAGNOSIS — R5383 Other fatigue: Secondary | ICD-10-CM | POA: Diagnosis not present

## 2020-08-28 DIAGNOSIS — K7460 Unspecified cirrhosis of liver: Secondary | ICD-10-CM

## 2020-08-28 DIAGNOSIS — E669 Obesity, unspecified: Secondary | ICD-10-CM

## 2020-08-28 NOTE — Progress Notes (Signed)
Ht 5' 11.65" (1.82 m)   Wt 195 lb 12.8 oz (88.8 kg)   BMI 26.81 kg/m    Subjective:    Patient ID: Harold Tate, male    DOB: 06-08-1965, 55 y.o.   MRN: [id number]  HPI: Harold Tate is a 55 y.o. male  Patient is here for follow-up after his CT and GI well.  He is very tearful today after he got diagnosed with cirrhosis unsure about his diagnosis being secondary to alcohol per his verbal record patient did not know if he was to have additional tests done at his GI doctor's office. He says he was scheduled to have a endoscopy and a colonoscopy in July but is not sure if he is comfortable to wait up until then for further work-up of his condition.    Chief Complaint  Patient presents with  . CT Results  . Immunizations    Patient would like to have a Hep vaccination  . Nutrition Counseling    Patient would like to have a referral.  Can she take Same's suppose to be good for his liver, Sister takes    Relevant past medical, surgical, family and social history reviewed and updated as indicated. Interim medical history since our last visit reviewed. Allergies and medications reviewed and updated.  Review of Systems  Constitutional: Negative for activity change, appetite change, chills, fatigue and fever.  HENT: Negative for congestion, ear discharge, ear pain and facial swelling.   Eyes: Negative for pain, discharge and itching.  Respiratory: Negative for cough, chest tightness, shortness of breath and wheezing.   Cardiovascular: Negative for chest pain, palpitations and leg swelling.  Gastrointestinal: Negative for abdominal distention, abdominal pain, blood in stool, constipation, diarrhea, nausea and vomiting.  Endocrine: Negative for cold intolerance, heat intolerance, polydipsia, polyphagia and polyuria.  Genitourinary: Negative for difficulty urinating, dysuria, flank pain, frequency, hematuria and urgency.  Musculoskeletal: Negative for arthralgias, gait problem, joint  swelling and myalgias.  Skin: Negative for color change, rash and wound.  Neurological: Negative for dizziness, tremors, speech difficulty, weakness, light-headedness, numbness and headaches.  Hematological: Does not bruise/bleed easily.  Psychiatric/Behavioral: Negative for agitation, confusion, decreased concentration, sleep disturbance and suicidal ideas.    Per HPI unless specifically indicated above     Objective:    Ht 5' 11.65" (1.82 m)   Wt 195 lb 12.8 oz (88.8 kg)   BMI 26.81 kg/m   Wt Readings from Last 3 Encounters:  08/28/20 195 lb 12.8 oz (88.8 kg)  08/16/20 198 lb 9.6 oz (90.1 kg)  07/11/20 210 lb 9.6 oz (95.5 kg)    Physical Exam Vitals and nursing note reviewed.  Constitutional:      General: He is not in acute distress.    Appearance: Normal appearance. He is not ill-appearing or diaphoretic.  HENT:     Head: Normocephalic and atraumatic.     Right Ear: Tympanic membrane and external ear normal. There is no impacted cerumen.     Left Ear: External ear normal.     Nose: No congestion or rhinorrhea.     Mouth/Throat:     Pharynx: No oropharyngeal exudate or posterior oropharyngeal erythema.  Eyes:     Conjunctiva/sclera: Conjunctivae normal.     Pupils: Pupils are equal, round, and reactive to light.  Cardiovascular:     Rate and Rhythm: Normal rate and regular rhythm.     Heart sounds: No murmur heard. No friction rub. No gallop.   Pulmonary:  Effort: No respiratory distress.     Breath sounds: No stridor. No wheezing or rhonchi.  Chest:     Chest wall: No tenderness.  Abdominal:     General: Abdomen is flat. Bowel sounds are normal.     Palpations: Abdomen is soft. There is no mass.     Tenderness: There is no abdominal tenderness.  Musculoskeletal:     Cervical back: Normal range of motion and neck supple. No rigidity or tenderness.     Left lower leg: No edema.  Skin:    General: Skin is warm and dry.  Neurological:     Mental Status: He is  alert.     Results for orders placed or performed in visit on 08/18/20  Comprehensive metabolic panel  Result Value Ref Range   Glucose 96 65 - 99 mg/dL   BUN 7 6 - 24 mg/dL   Creatinine, Ser 0.86 0.76 - 1.27 mg/dL   eGFR 103 >59 mL/min/1.73   BUN/Creatinine Ratio 8 (L) 9 - 20   Sodium 139 134 - 144 mmol/L   Potassium 4.6 3.5 - 5.2 mmol/L   Chloride 100 96 - 106 mmol/L   CO2 21 20 - 29 mmol/L   Calcium 9.6 8.7 - 10.2 mg/dL   Total Protein 7.4 6.0 - 8.5 g/dL   Albumin 4.2 3.8 - 4.9 g/dL   Globulin, Total 3.2 1.5 - 4.5 g/dL   Albumin/Globulin Ratio 1.3 1.2 - 2.2   Bilirubin Total 0.8 0.0 - 1.2 mg/dL   Alkaline Phosphatase 123 (H) 44 - 121 IU/L   AST 17 0 - 40 IU/L   ALT 16 0 - 44 IU/L  Bilirubin, Direct  Result Value Ref Range   Bilirubin, Direct 0.18 0.00 - 0.40 mg/dL  Acute Hep Panel & Hep B Surface Ab  Result Value Ref Range   Hep A IgM Negative Negative   Hepatitis B Surface Ag Negative Negative   Hep B C IgM Negative Negative   Hepatitis B Surf Ab Quant <3.1 (L) Immunity>9.9 mIU/mL   Hep C Virus Ab <0.1 0.0 - 0.9 s/co ratio        Current Outpatient Medications:  .  clonazePAM (KLONOPIN) 0.5 MG tablet, Take 1 tablet (0.5 mg total) by mouth 3 (three) times daily as needed for anxiety., Disp: 90 tablet, Rfl: 2 .  sildenafil (VIAGRA) 100 MG tablet, Take 0.5-1 tablets (50-100 mg total) by mouth daily as needed for erectile dysfunction., Disp: 5 tablet, Rfl: 11 .  vitamin B-12 (CYANOCOBALAMIN) 1000 MCG tablet, Take 1 tablet (1,000 mcg total) by mouth daily., Disp: 90 tablet, Rfl: 4     Assessment & Plan:  1.  Cirrhosis: New diagnosis we will check acute hepatitis panel.  Would like referral to nutritionist wants a second opinion with a hepatologist. Fatigue / wt loss : Stopped drinking per pt in January  , used to drink [employer] - 30 cans / week.  Hold farxiga recheck in 2- 3 weeks.  Ct of abdomen / chest   Cscope ordered as will URGENT referral made to GI   Hep C -ve   Stopped farxiga and dm meds sec to wt loss has an A1c of 5.8    Ref. Range 07/11/2020 11:23 07/11/2020 11:54 08/16/2020 15:51 08/16/2020 15:57 08/18/2020 11:52  Alkaline Phosphatase Latest Ref Range: 44 - 121 IU/L 138 (H)    123 (H)   Check US abdomen ? Will check with Dr. Alice Reichert.    Ref. Range 08/16/2020 15:51 08/16/2020  15:57 08/18/2020 11:52  Glucose Latest Ref Range: 65 - 99 mg/dL  81 96  HB A1C (BAYER DCA - WAIVED) Latest Ref Range: <7.0 % 5.8     Ms weakness : fu with neurology for such. Has a hereditary med condition :  Hereditary spastic paraparesis/  Polyneuropathy that was recently diagnosed.   Pain int the feet ? Sec to polyneuropathy to fu with neuro was placed on baclofen for such   Problem List Items Addressed This Visit      Digestive   Alcoholic cirrhosis of liver without ascites ([place])     Endocrine   Hyperlipidemia associated with type 2 diabetes mellitus ([place])   Relevant Orders   Lipid panel   Type 2 diabetes mellitus with obesity ([place])   Relevant Orders   Bayer DCA Hb A1c Waived    Other Visit Diagnoses    Hepatic cirrhosis, unspecified hepatic cirrhosis type, unspecified whether ascites present ([place])    -  Primary   Relevant Orders   Amb Referral to Nutrition and Diabetic Education   Acute Hep Panel & Hep B Surface Ab   Amb Referral to Hepatology   Comprehensive metabolic panel   CBC with Differential/Platelet   Low energy       Relevant Orders   Thyroid Panel With TSH       Follow up plan: No follow-ups on file.

## 2020-08-29 ENCOUNTER — Encounter: Payer: Self-pay | Admitting: Internal Medicine

## 2020-08-31 ENCOUNTER — Encounter: Payer: Self-pay | Admitting: *Deleted

## 2020-09-05 ENCOUNTER — Ambulatory Visit (INDEPENDENT_AMBULATORY_CARE_PROVIDER_SITE_OTHER): Payer: 59 | Admitting: Nurse Practitioner

## 2020-09-05 ENCOUNTER — Encounter: Payer: Self-pay | Admitting: Nurse Practitioner

## 2020-09-05 VITALS — BP 125/78 | HR 80

## 2020-09-05 DIAGNOSIS — U071 COVID-19: Secondary | ICD-10-CM | POA: Insufficient documentation

## 2020-09-05 NOTE — Patient Instructions (Signed)

## 2020-09-05 NOTE — Progress Notes (Signed)
Acute Office Visit  Subjective:    Patient ID: Harold Tate, male    DOB: [birth year], 54 y.o.   MRN: [id number]  Chief Complaint  Patient presents with  . Covid Positive    Patient states he has had body-aches,fever/chills and real bad headache at the front of his head. Patient states he became symptomatic on Saturday and states he felt a little better on Sunday and he thought he felt a lot better yesterday. Patient states since then he has started to feel horrible again today.     HPI Patient is in today for cough and congestion that started Saturday 09/02/20.   COVID-19  Worst symptom: all symptoms resolving Fever: yes, low grade 100.1 Cough: very little Shortness of breath: no Wheezing: no Chest pain: no Chest tightness: no Chest congestion: no Nasal congestion: yes Runny nose: yes Post nasal drip: no Sneezing: yes Sore throat: no Swollen glands: no Sinus pressure: no Headache: yes Face pain: no Toothache: no Ear pain: no  Ear pressure: no  Eyes red/itching:no Eye drainage/crusting: no  Vomiting: no Rash: no Fatigue: yes Sick contacts: yes, his son had covid-19 last week Strep contacts: no  Context: better Recurrent sinusitis: no Relief with OTC cold/cough medications: yes  Treatments attempted: pseudoephedrine    Past Medical History:  Diagnosis Date  . Anxiety   . Arthritis   . Diabetes mellitus without complication ([place])   . Dyspnea   . Hip pain   . Hypertension   . Raynaud's syndrome   . Sleep apnea     CPAP/ Doesn't use    Past Surgical History:  Procedure Laterality Date  . I & D EXTREMITY    . OPEN REDUCTION INTERNAL FIXATION (ORIF) TIBIA/FIBULA FRACTURE Left July 2014  . TOTAL HIP ARTHROPLASTY Right 11/03/2018   Procedure: RIGHT TOTAL HIP ARTHROPLASTY ANTERIOR APPROACH;  Surgeon: Hessie Knows, MD;  Location: [facility];  Service: Orthopedics;  Laterality: Right;  . TOTAL HIP ARTHROPLASTY Left 02/11/2019   Procedure: TOTAL HIP  ARTHROPLASTY ANTERIOR APPROACH;  Surgeon: Hessie Knows, MD;  Location: [facility];  Service: Orthopedics;  Laterality: Left;    Family History  Problem Relation Age of Onset  . Depression Sister     Social History   Socioeconomic History  . Marital status: Married    Spouse name: Not on file  . Number of children: Not on file  . Years of education: Not on file  . Highest education level: Not on file  Occupational History  . Not on file  Tobacco Use  . Smoking status: Current Every Day Smoker    Packs/day: 1.00    Years: 27.00    Pack years: 27.00    Types: Cigarettes  . Smokeless tobacco: Never Used  Vaping Use  . Vaping Use: Never used  Substance and Sexual Activity  . Alcohol use: Yes    Alcohol/week: 14.0 standard drinks    Types: 14 Cans of beer per week  . Drug use: Not Currently    Types: Benzodiazepines  . Sexual activity: Not on file  Other Topics Concern  . Not on file  Social History Narrative  . Not on file   Social Determinants of Health   Financial Resource Strain: Not on file  Food Insecurity: Not on file  Transportation Needs: Not on file  Physical Activity: Not on file  Stress: Not on file  Social Connections: Not on file  Intimate Partner Violence: Not on file    Outpatient Medications Prior  to Visit  Medication Sig Dispense Refill  . clonazePAM (KLONOPIN) 0.5 MG tablet Take 1 tablet (0.5 mg total) by mouth 3 (three) times daily as needed for anxiety. 90 tablet 2  . sildenafil (VIAGRA) 100 MG tablet Take 0.5-1 tablets (50-100 mg total) by mouth daily as needed for erectile dysfunction. 5 tablet 11  . vitamin B-12 (CYANOCOBALAMIN) 1000 MCG tablet Take 1 tablet (1,000 mcg total) by mouth daily. 90 tablet 4   No facility-administered medications prior to visit.    No Known Allergies  Review of Systems  Constitutional: Positive for fatigue and fever.  HENT: Positive for congestion and rhinorrhea. Negative for ear pain, postnasal drip, sinus  pressure and sore throat.   Eyes: Negative.   Respiratory: Negative.   Cardiovascular: Negative.   Gastrointestinal: Negative.   Genitourinary: Negative.   Musculoskeletal: Positive for myalgias.  Skin: Negative.   Neurological: Negative.        Objective:    Physical Exam Vitals and nursing note reviewed.  Pulmonary:     Comments: Able to talk in complete sentences Neurological:     Mental Status: He is alert and oriented to person, place, and time.     BP 125/78   Pulse 80  Wt Readings from Last 3 Encounters:  08/28/20 195 lb 12.8 oz (88.8 kg)  08/16/20 198 lb 9.6 oz (90.1 kg)  07/11/20 210 lb 9.6 oz (95.5 kg)    Health Maintenance Due  Topic Date Due  . OPHTHALMOLOGY EXAM  Never done    There are no preventive care reminders to display for this patient.   Lab Results  Component Value Date   TSH 2.830 05/30/2020   Lab Results  Component Value Date   WBC 6.6 08/16/2020   HGB 16.0 08/16/2020   HCT 46.4 08/16/2020   MCV 95 08/16/2020   PLT 169 08/16/2020   Lab Results  Component Value Date   NA 139 08/18/2020   K 4.6 08/18/2020   CO2 21 08/18/2020   GLUCOSE 96 08/18/2020   BUN 7 08/18/2020   CREATININE 0.86 08/18/2020   BILITOT 0.8 08/18/2020   ALKPHOS 123 (H) 08/18/2020   AST 17 08/18/2020   ALT 16 08/18/2020   PROT 7.4 08/18/2020   ALBUMIN 4.2 08/18/2020   CALCIUM 9.6 08/18/2020   ANIONGAP 8 02/12/2019   EGFR 103 08/18/2020   Lab Results  Component Value Date   CHOL 165 07/11/2020   Lab Results  Component Value Date   HDL 44 07/11/2020   Lab Results  Component Value Date   LDLCALC 99 07/11/2020   Lab Results  Component Value Date   TRIG 122 07/11/2020   Lab Results  Component Value Date   CHOLHDL 5.6 (H) 06/05/2016   Lab Results  Component Value Date   HGBA1C 5.8 08/16/2020       Assessment & Plan:   Problem List Items Addressed This Visit      Other   COVID-19 - Primary    Positive covid-19 test on 09/02/20.  Encouraged rest and fluids. He is very hesitant to take any medications but would be interested to talk to someone to learn more about anything he might be eligible for. Discussed quarantine for 5 days from symptom onset or 10 days if he still has symptoms. Discussed ER precautions and signs and symptoms of DVT since he has a history of this. Follow-up if symptoms worsen or don't improve. Keep appointment on 09/19/20 with Dr. Neomia Dear.  Relevant Orders   Ambulatory referral for Covid Treatment       No orders of the defined types were placed in this encounter.   . This visit was completed via telephone due to the restrictions of the COVID-19 pandemic. All issues as above were discussed and addressed but no physical exam was performed. If it was felt that the patient should be evaluated in the office, they were directed there. The patient verbally consented to this visit. Patient was unable to complete an audio/visual visit due to Lack of equipment. Due to the catastrophic nature of the COVID-19 pandemic, this visit was done through audio contact only. . Location of the patient: home . Location of the provider: work . Those involved with this call:  . Provider: Billy Fischer, DNP . CMA: Irena Reichmann, CMA . Front Desk/Registration: Jill Side  . Time spent on call: 20 minutes on the phone discussing health concerns. 10 minutes total spent in review of patient's record and preparation of their chart.    Charyl Dancer, NP

## 2020-09-05 NOTE — Assessment & Plan Note (Signed)
Positive covid-19 test on 09/02/20. Encouraged rest and fluids. He is very hesitant to take any medications but would be interested to talk to someone to learn more about anything he might be eligible for. Discussed quarantine for 5 days from symptom onset or 10 days if he still has symptoms. Discussed ER precautions and signs and symptoms of DVT since he has a history of this. Follow-up if symptoms worsen or don't improve. Keep appointment on 09/19/20 with Dr. Neomia Dear.

## 2020-09-06 ENCOUNTER — Telehealth: Payer: Self-pay | Admitting: Oncology

## 2020-09-06 ENCOUNTER — Encounter: Payer: Self-pay | Admitting: Oncology

## 2020-09-06 NOTE — Telephone Encounter (Signed)
Called to discuss with patient about COVID-19 symptoms and the use of one of the available treatments for those with mild to moderate Covid symptoms and at a high risk of hospitalization.  Pt appears to qualify for outpatient treatment due to co-morbid conditions and/or a member of an at-risk group in accordance with the FDA Emergency Use Authorization.    Symptom onset: 09/03/20 Vaccinated: Unsure Booster? No  Immunocompromised? No  Qualifiers:  Past Medical History:  Diagnosis Date  . Anxiety   . Arthritis   . Diabetes mellitus without complication ([place])   . Dyspnea   . Hip pain   . Hypertension   . Raynaud's syndrome   . Sleep apnea     CPAP/ Doesn't use   Unable to reach pt - Left VM and sent [place]

## 2020-09-11 ENCOUNTER — Other Ambulatory Visit: Payer: 59

## 2020-09-19 ENCOUNTER — Ambulatory Visit: Payer: 59 | Admitting: Internal Medicine

## 2020-09-22 ENCOUNTER — Other Ambulatory Visit: Payer: Self-pay

## 2020-09-22 ENCOUNTER — Other Ambulatory Visit: Payer: 59

## 2020-09-22 DIAGNOSIS — K7460 Unspecified cirrhosis of liver: Secondary | ICD-10-CM

## 2020-09-22 DIAGNOSIS — E1169 Type 2 diabetes mellitus with other specified complication: Secondary | ICD-10-CM

## 2020-09-22 DIAGNOSIS — R5383 Other fatigue: Secondary | ICD-10-CM

## 2020-09-22 LAB — BAYER DCA HB A1C WAIVED: HB A1C (BAYER DCA - WAIVED): 5.8 % (ref <7.0)

## 2020-09-23 LAB — CBC WITH DIFFERENTIAL/PLATELET
Basophils Absolute: 0.1 10*3/uL (ref 0.0–0.2)
Basos: 1 %
EOS (ABSOLUTE): 0.3 10*3/uL (ref 0.0–0.4)
Eos: 3 %
Hematocrit: 43.8 % (ref 37.5–51.0)
Hemoglobin: 14.7 g/dL (ref 13.0–17.7)
Immature Grans (Abs): 0 10*3/uL (ref 0.0–0.1)
Immature Granulocytes: 0 %
Lymphocytes Absolute: 2 10*3/uL (ref 0.7–3.1)
Lymphs: 24 %
MCH: 32.2 pg (ref 26.6–33.0)
MCHC: 33.6 g/dL (ref 31.5–35.7)
MCV: 96 fL (ref 79–97)
Monocytes Absolute: 0.7 10*3/uL (ref 0.1–0.9)
Monocytes: 8 %
Neutrophils Absolute: 5.2 10*3/uL (ref 1.4–7.0)
Neutrophils: 64 %
Platelets: 156 10*3/uL (ref 150–450)
RBC: 4.57 x10E6/uL (ref 4.14–5.80)
RDW: 13.1 % (ref 11.6–15.4)
WBC: 8.3 10*3/uL (ref 3.4–10.8)

## 2020-09-23 LAB — COMPREHENSIVE METABOLIC PANEL
ALT: 21 IU/L (ref 0–44)
AST: 19 IU/L (ref 0–40)
Albumin/Globulin Ratio: 1.5 (ref 1.2–2.2)
Albumin: 4.1 g/dL (ref 3.8–4.9)
Alkaline Phosphatase: 138 IU/L — ABNORMAL HIGH (ref 44–121)
BUN/Creatinine Ratio: 9 (ref 9–20)
BUN: 7 mg/dL (ref 6–24)
Bilirubin Total: 0.7 mg/dL (ref 0.0–1.2)
CO2: 23 mmol/L (ref 20–29)
Calcium: 9.4 mg/dL (ref 8.7–10.2)
Chloride: 99 mmol/L (ref 96–106)
Creatinine, Ser: 0.77 mg/dL (ref 0.76–1.27)
Globulin, Total: 2.8 g/dL (ref 1.5–4.5)
Glucose: 164 mg/dL — ABNORMAL HIGH (ref 65–99)
Potassium: 3.9 mmol/L (ref 3.5–5.2)
Sodium: 138 mmol/L (ref 134–144)
Total Protein: 6.9 g/dL (ref 6.0–8.5)
eGFR: 106 mL/min/{1.73_m2} (ref >59)

## 2020-09-23 LAB — LIPID PANEL
Chol/HDL Ratio: 4.2 ratio (ref 0.0–5.0)
Cholesterol, Total: 195 mg/dL (ref 100–199)
HDL: 46 mg/dL (ref >39)
LDL Chol Calc (NIH): 128 mg/dL — ABNORMAL HIGH (ref 0–99)
Triglycerides: 116 mg/dL (ref 0–149)
VLDL Cholesterol Cal: 21 mg/dL (ref 5–40)

## 2020-09-23 LAB — THYROID PANEL WITH TSH
Free Thyroxine Index: 2.1 (ref 1.2–4.9)
T3 Uptake Ratio: 25 % (ref 24–39)
T4, Total: 8.2 ug/dL (ref 4.5–12.0)
TSH: 2.67 u[IU]/mL (ref 0.450–4.500)

## 2020-09-27 ENCOUNTER — Ambulatory Visit: Payer: 59 | Admitting: Nurse Practitioner

## 2020-09-27 ENCOUNTER — Other Ambulatory Visit: Payer: Self-pay

## 2020-09-28 ENCOUNTER — Encounter: Payer: Self-pay | Admitting: Nurse Practitioner

## 2020-09-28 ENCOUNTER — Ambulatory Visit (INDEPENDENT_AMBULATORY_CARE_PROVIDER_SITE_OTHER): Payer: 59 | Admitting: Nurse Practitioner

## 2020-09-28 VITALS — BP 125/80 | HR 81 | Temp 98.4°F | Wt 198.0 lb

## 2020-09-28 DIAGNOSIS — E1159 Type 2 diabetes mellitus with other circulatory complications: Secondary | ICD-10-CM

## 2020-09-28 DIAGNOSIS — T148XXA Other injury of unspecified body region, initial encounter: Secondary | ICD-10-CM | POA: Diagnosis not present

## 2020-09-28 DIAGNOSIS — E785 Hyperlipidemia, unspecified: Secondary | ICD-10-CM

## 2020-09-28 DIAGNOSIS — E669 Obesity, unspecified: Secondary | ICD-10-CM

## 2020-09-28 DIAGNOSIS — E1169 Type 2 diabetes mellitus with other specified complication: Secondary | ICD-10-CM | POA: Diagnosis not present

## 2020-09-28 DIAGNOSIS — M79645 Pain in left finger(s): Secondary | ICD-10-CM

## 2020-09-28 DIAGNOSIS — I152 Hypertension secondary to endocrine disorders: Secondary | ICD-10-CM

## 2020-09-28 MED ORDER — CEPHALEXIN 500 MG PO CAPS: 500.0000 mg | ORAL_CAPSULE | Freq: Three times a day (TID) | ORAL | 0 refills | Status: DC

## 2020-09-28 MED ORDER — KETOROLAC TROMETHAMINE 60 MG/2ML IM SOLN
60.0000 mg | Freq: Once | INTRAMUSCULAR | Status: AC
  Administered 2020-09-28: 60 mg via INTRAMUSCULAR

## 2020-09-28 NOTE — Assessment & Plan Note (Signed)
Chronic, controlled. Holding statin at this time due to elevated liver functions in the past. Liver function now stable. Follow-up in 6 months.

## 2020-09-28 NOTE — Progress Notes (Signed)
Established Patient Office Visit  Subjective:  Patient ID: Harold Tate, male    DOB: 26-May-1965  Age: 55 y.o. MRN: [id number]  CC:  Chief Complaint  Patient presents with  . has splinter in Right index finger  . Review Lab Results     HPI Harold Tate presents for splinter and to review recent lab results.   SPLINTER  He got a splinter in his left index finger at work today. It is tender to touch, some slight redness. He denies any fevers, shortness of breath, and chest pain.   Past Medical History:  Diagnosis Date  . Anxiety   . Arthritis   . Diabetes mellitus without complication ([place])   . Dyspnea   . Hip pain   . Hypertension   . Raynaud's syndrome   . Sleep apnea     CPAP/ Doesn't use    Past Surgical History:  Procedure Laterality Date  . I & D EXTREMITY    . OPEN REDUCTION INTERNAL FIXATION (ORIF) TIBIA/FIBULA FRACTURE Left July 2014  . TOTAL HIP ARTHROPLASTY Right 11/03/2018   Procedure: RIGHT TOTAL HIP ARTHROPLASTY ANTERIOR APPROACH;  Surgeon: Hessie Knows, MD;  Location: [facility];  Service: Orthopedics;  Laterality: Right;  . TOTAL HIP ARTHROPLASTY Left 02/11/2019   Procedure: TOTAL HIP ARTHROPLASTY ANTERIOR APPROACH;  Surgeon: Hessie Knows, MD;  Location: [facility];  Service: Orthopedics;  Laterality: Left;    Family History  Problem Relation Age of Onset  . Depression Sister     Social History   Socioeconomic History  . Marital status: Married    Spouse name: Not on file  . Number of children: Not on file  . Years of education: Not on file  . Highest education level: Not on file  Occupational History  . Not on file  Tobacco Use  . Smoking status: Current Every Day Smoker    Packs/day: 1.00    Years: 27.00    Pack years: 27.00    Types: Cigarettes  . Smokeless tobacco: Never Used  Vaping Use  . Vaping Use: Never used  Substance and Sexual Activity  . Alcohol use: Yes    Alcohol/week: 14.0 standard drinks    Types: 14 Cans of beer  per week  . Drug use: Not Currently    Types: Benzodiazepines  . Sexual activity: Not on file  Other Topics Concern  . Not on file  Social History Narrative  . Not on file   Social Determinants of Health   Financial Resource Strain: Not on file  Food Insecurity: Not on file  Transportation Needs: Not on file  Physical Activity: Not on file  Stress: Not on file  Social Connections: Not on file  Intimate Partner Violence: Not on file    Outpatient Medications Prior to Visit  Medication Sig Dispense Refill  . clonazePAM (KLONOPIN) 0.5 MG tablet Take 1 tablet (0.5 mg total) by mouth 3 (three) times daily as needed for anxiety. 90 tablet 2  . sildenafil (VIAGRA) 100 MG tablet Take 0.5-1 tablets (50-100 mg total) by mouth daily as needed for erectile dysfunction. 5 tablet 11  . vitamin B-12 (CYANOCOBALAMIN) 1000 MCG tablet Take 1 tablet (1,000 mcg total) by mouth daily. 90 tablet 4   No facility-administered medications prior to visit.    No Known Allergies  ROS Review of Systems  HENT: Negative.   Respiratory: Negative.   Cardiovascular: Negative.   Gastrointestinal: Negative.   Genitourinary: Negative.   Skin:  Splinter in left index finger  Neurological: Negative.       Objective:    Physical Exam Vitals and nursing note reviewed.  Constitutional:      Appearance: Normal appearance.  HENT:     Head: Normocephalic.  Eyes:     Conjunctiva/sclera: Conjunctivae normal.  Pulmonary:     Effort: Pulmonary effort is normal.  Musculoskeletal:     Cervical back: Normal range of motion.  Skin:    General: Skin is warm and dry.     Comments: Splinter noted to left index finger  Neurological:     General: No focal deficit present.     Mental Status: He is alert and oriented to person, place, and time.  Psychiatric:        Mood and Affect: Mood normal.        Behavior: Behavior normal.        Thought Content: Thought content normal.        Judgment: Judgment  normal.     BP 125/80   Pulse 81   Temp 98.4 F (36.9 C) (Oral)   Wt 198 lb (89.8 kg)   SpO2 98%   BMI 27.11 kg/m  Wt Readings from Last 3 Encounters:  09/28/20 198 lb (89.8 kg)  08/28/20 195 lb 12.8 oz (88.8 kg)  08/16/20 198 lb 9.6 oz (90.1 kg)     Health Maintenance Due  Topic Date Due  . COVID-19 Vaccine (1) Never done  . OPHTHALMOLOGY EXAM  Never done  . Zoster Vaccines- Shingrix (1 of 2) Never done    There are no preventive care reminders to display for this patient.  Lab Results  Component Value Date   TSH 2.670 09/22/2020   Lab Results  Component Value Date   WBC 8.3 09/22/2020   HGB 14.7 09/22/2020   HCT 43.8 09/22/2020   MCV 96 09/22/2020   PLT 156 09/22/2020   Lab Results  Component Value Date   NA 138 09/22/2020   K 3.9 09/22/2020   CO2 23 09/22/2020   GLUCOSE 164 (H) 09/22/2020   BUN 7 09/22/2020   CREATININE 0.77 09/22/2020   BILITOT 0.7 09/22/2020   ALKPHOS 138 (H) 09/22/2020   AST 19 09/22/2020   ALT 21 09/22/2020   PROT 6.9 09/22/2020   ALBUMIN 4.1 09/22/2020   CALCIUM 9.4 09/22/2020   ANIONGAP 8 02/12/2019   EGFR 106 09/22/2020   Lab Results  Component Value Date   CHOL 195 09/22/2020   Lab Results  Component Value Date   HDL 46 09/22/2020   Lab Results  Component Value Date   LDLCALC 128 (H) 09/22/2020   Lab Results  Component Value Date   TRIG 116 09/22/2020   Lab Results  Component Value Date   CHOLHDL 4.2 09/22/2020   Lab Results  Component Value Date   HGBA1C 5.8 09/22/2020      Assessment & Plan:   Problem List Items Addressed This Visit      Cardiovascular and Mediastinum   Hypertension associated with diabetes ([place])    Chronic, stable. Blood pressure controlled at this visit. Reviewed his CMP, CBC, and TSH with him. Follow up in 6 months.        Endocrine   Hyperlipidemia associated with type 2 diabetes mellitus ([facility])    Chronic, controlled. Holding statin at this time due to elevated liver  functions in the past. Liver function now stable. Follow-up in 6 months.       Type 2  diabetes mellitus with obesity ([facility])    Chronic. He stopped his farxiga last visit. His glucose was elevated on his last check, however he said that he had just drank a red bull before the labs were drawn. Encouraged him to check his blood sugars at home. Will follow-up in 2 months.       Other Visit Diagnoses    Splinter    -  Primary   Under left index finger nail. Unable to remove in office. Urgent referral placed to hand surgery. Will send in keflex if it starts turning red, inflammed prn   Relevant Medications   ketorolac (TORADOL) injection 60 mg (Completed)   Other Relevant Orders   Ambulatory referral to Hand Surgery   Pain of finger of left hand       Associated with splinter. Will give toradol shot in office today   Relevant Medications   ketorolac (TORADOL) injection 60 mg (Completed)      Meds ordered this encounter  Medications  . ketorolac (TORADOL) injection 60 mg  . cephALEXin (KEFLEX) 500 MG capsule    Sig: Take 1 capsule (500 mg total) by mouth 3 (three) times daily.    Dispense:  21 capsule    Refill:  0    Follow-up: Return in about 2 months (around 11/28/2020) for f/u after egd/colonoscopy, pulmonology .    Charyl Dancer, NP

## 2020-09-28 NOTE — Patient Instructions (Signed)
Sliver Removal, Care After A sliver--also called a splinter--is a small and thin broken piece of an object that gets stuck (embedded) under your skin. A sliver can create a deep wound that can easily become infected. It is important to care for the wound after a sliver is removed to help prevent infection and other problems from developing. Slivers often break into smaller pieces when they are removed. If pieces of your sliver broke off and stayed in your skin, in time you will see them working themselves out. You also may feel some pain at the wound site. This is normal. What can I expect after the procedure? After a sliver has been removed, it is common to have:  Some pain at the wound site. This is especially common if pieces of the sliver remained in your skin and will come out on their own. Follow these instructions at home: Wound care  Follow instructions from your health care provider about how to take care of your wound. Make sure you: ? Wash your hands with soap and water before you change your bandage (dressing). If soap and water are not available, use hand sanitizer. ? Change your dressing as told by your health care provider.  Check your wound every day for signs of infection. Check for: ? Redness, swelling, or pain. ? Fluid or blood. ? Pus or a bad smell. ? Warmth.  Do not take baths, swim, or use a hot tub until your health care provider says it is okay to do so.   General instructions  Take over-the-counter and prescription medicines only as told by your health care provider.  If you were prescribed an antibiotic medicine, take or apply it as told by your health care provider. Do not stop using the antibiotic even if you start to feel better.  If directed, put ice on the painful area. ? Put ice in a plastic bag. ? Place a towel between your skin and the bag. ? Leave the ice on for 20 minutes, 2-3 times a day.  Keep all follow-up visits as told by your health care  provider. This is important. Contact a health care provider if:  You think that a piece of the sliver is still in your skin.  You have signs of infection, including: ? New or worsening redness around the wound. ? New or worsening tenderness around the wound. ? Fluid, blood, or pus coming from the wound. ? A bad smell coming from the wound or dressing.  You received a tetanus shot and you have swelling, severe pain, redness, or bleeding at the injection site. Get help right away if you have:  Red streaks coming from the wound.  An unexplained fever. Summary  A sliver--also called a splinter--is a small and thin broken piece of an object that gets stuck (embedded) under your skin.  It is important to care for the wound after a sliver is removed to help prevent infection and other problems from developing.  Be sure to contact your health care provider if fluid, blood, or pus is coming from the wound. This information is not intended to replace advice given to you by your health care provider. Make sure you discuss any questions you have with your health care provider. Document Revised: 08/06/2018 Document Reviewed: 05/13/2017 Elsevier Patient Education  2021 [employer]  

## 2020-09-28 NOTE — Assessment & Plan Note (Signed)
Chronic, stable. Blood pressure controlled at this visit. Reviewed his CMP, CBC, and TSH with him. Follow up in 6 months.

## 2020-09-28 NOTE — Assessment & Plan Note (Signed)
Chronic. He stopped his farxiga last visit. His glucose was elevated on his last check, however he said that he had just drank a red bull before the labs were drawn. Encouraged him to check his blood sugars at home. Will follow-up in 2 months.

## 2020-10-11 ENCOUNTER — Ambulatory Visit: Payer: 59 | Admitting: Internal Medicine

## 2020-10-31 ENCOUNTER — Ambulatory Visit (INDEPENDENT_AMBULATORY_CARE_PROVIDER_SITE_OTHER): Payer: 59 | Admitting: Gastroenterology

## 2020-10-31 ENCOUNTER — Encounter: Payer: Self-pay | Admitting: Gastroenterology

## 2020-10-31 ENCOUNTER — Other Ambulatory Visit: Payer: Self-pay

## 2020-10-31 VITALS — BP 101/63 | HR 80 | Ht 74.0 in | Wt 198.4 lb

## 2020-10-31 DIAGNOSIS — K7460 Unspecified cirrhosis of liver: Secondary | ICD-10-CM | POA: Diagnosis not present

## 2020-10-31 NOTE — Progress Notes (Signed)
Gastroenterology Consultation  Referring Provider:     Charlynne Cousins, MD Primary Care Physician:  Charlynne Cousins, MD Primary Gastroenterologist:  Dr. Allen Norris     Reason for Consultation:     Second opinion due to a diagnosis of cirrhosis        HPI:   Harold Tate is a 55 y.o. y/o male referred for consultation & management of second opinion for diagnosis of cirrhosis by Dr. Charlynne Cousins, MD. This patient comes to see me today after being set up for colonoscopy back in 2018 by me but the patient canceled because he had the flu.  The patient then followed up with Dr. Alice Reichert at the [facility] and was seen by him with a suggestion of a colonoscopy and EGD due to a family history of colon polyps and because of his cirrhosis to rule out varices.  The patient had a CT scan by his primary care provider that showed:  IMPRESSION: -Two 4 mm nodules in the lungs as above. No follow-up needed if patient is low-risk (and has no known or suspected primary neoplasm). Non-contrast chest CT can be considered in 12 months if patient is high-risk. This recommendation follows the consensus statement: Guidelines for Management of Incidental Pulmonary Nodules Detected on CT Images: From the Fleischner Society 2017; Radiology 2017; 284:228-243. -Emphysema. -Coronary artery disease. -Diffuse fatty infiltration of the liver with nodular contour suggesting cirrhosis. -Sigmoid diverticulosis.  The patient has a diagnosis of alcohol use on his chart. The patient reports that he is a functioning adult without any issues associated with alcohol use.  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  The patient is concerned about his diagnosis of possible cirrhosis.  The imaging also showed fatty liver.  He states that he has completely stopped drinking alcohol.  He also reports that he had been drinking varying amounts since his youth.   Past Medical History:   Diagnosis Date   Anxiety    Arthritis    Diabetes mellitus without complication ([place])    Dyspnea    Hip pain    Hypertension    Raynaud's syndrome    Sleep apnea     CPAP/ Doesn't use    Past Surgical History:  Procedure Laterality Date   I & D EXTREMITY     OPEN REDUCTION INTERNAL FIXATION (ORIF) TIBIA/FIBULA FRACTURE Left July 2014   TOTAL HIP ARTHROPLASTY Right 11/03/2018   Procedure: RIGHT TOTAL HIP ARTHROPLASTY ANTERIOR APPROACH;  Surgeon: Hessie Knows, MD;  Location: [facility] ORS;  Service: Orthopedics;  Laterality: Right;   TOTAL HIP ARTHROPLASTY Left 02/11/2019   Procedure: TOTAL HIP ARTHROPLASTY ANTERIOR APPROACH;  Surgeon: Hessie Knows, MD;  Location: [facility] ORS;  Service: Orthopedics;  Laterality: Left;    Prior to Admission medications   Medication Sig Start Date End Date Taking? Authorizing Provider  baclofen (LIORESAL) 10 MG tablet Take 10 mg by mouth 3 (three) times daily.    [provider]  cephALEXin (KEFLEX) 500 MG capsule Take 1 capsule (500 mg total) by mouth 3 (three) times daily. 09/28/20   McElwee, Scheryl Darter, NP  clonazePAM (KLONOPIN) 0.5 MG tablet Take 1 tablet (0.5 mg total) by mouth 3 (three) times daily as needed for anxiety. 07/11/20   Cannady, Henrine Screws T, NP  losartan (COZAAR) 25 MG tablet Take 25 mg by mouth daily.    [provider]  metFORMIN (GLUCOPHAGE) 500 MG tablet Take by mouth  2 (two) times daily with a meal.    [provider]  rosuvastatin (CRESTOR) 5 MG tablet Take 5 mg by mouth daily.    [provider]  sildenafil (VIAGRA) 100 MG tablet Take 0.5-1 tablets (50-100 mg total) by mouth daily as needed for erectile dysfunction. 07/11/20   Cannady, Henrine Screws T, NP  vitamin B-12 (CYANOCOBALAMIN) 1000 MCG tablet Take 1 tablet (1,000 mcg total) by mouth daily. 04/26/20   Venita Lick, NP    Family History  Problem Relation Age of Onset   Depression Sister      Social History   Tobacco Use   Smoking status: Every  Day    Packs/day: 1.00    Years: 27.00    Pack years: 27.00    Types: Cigarettes   Smokeless tobacco: Never  Vaping Use   Vaping Use: Never used  Substance Use Topics   Alcohol use: Yes    Alcohol/week: 14.0 standard drinks    Types: 14 Cans of beer per week   Drug use: Not Currently    Types: Benzodiazepines    Allergies as of 10/31/2020   (No Known Allergies)    Review of Systems:    All systems reviewed and negative except where noted in HPI.   Physical Exam:  There were no vitals taken for this visit. No LMP for male patient. General:   Alert,  Well-developed, well-nourished, pleasant and cooperative in NAD Head:  Normocephalic and atraumatic. Eyes:  Sclera clear, no icterus.   Conjunctiva pink. Ears:  Normal auditory acuity. Neck:  Supple; no masses or thyromegaly. Lungs:  Respirations even and unlabored.  Clear throughout to auscultation.   No wheezes, crackles, or rhonchi. No acute distress. Heart:  Regular rate and rhythm; no murmurs, clicks, rubs, or gallops. Abdomen:  Normal bowel sounds.  No bruits.  Soft, non-tender and non-distended without masses, hepatosplenomegaly or hernias noted.  No guarding or rebound tenderness.  Negative Carnett sign.   Rectal:  Deferred.  Pulses:  Normal pulses noted. Extremities:  No clubbing or edema.  No cyanosis. Neurologic:  Alert and oriented x3;  grossly normal neurologically. Skin:  Intact without significant lesions or rashes.  No jaundice. Lymph Nodes:  No significant cervical adenopathy. Psych:  Alert and cooperative. Normal mood and affect.  Imaging Studies: No results found.  Assessment and Plan:   Harold Tate is a 55 y.o. y/o male who comes in with imaging suggestive of a nodular liver and cirrhosis.  The patient was concerned about the diagnosis and came for second opinion. He reports that he has a family history of colon polyps and was supposed to get an upper endoscopy tomorrow by Dr. Alice Reichert but states he wants  to cancel that and have it done by me.  The patient will have his blood sends off for a fibrosis score.  The patient will also have his INR checked. The patient has been told that if he does have cirrhosis it is compensated cirrhosis since there is no sign of ascites, thrombocytopenia or hypoalbuminemia.  He has been told that if there is confirmation on his fibrasure that he has cirrhosis he will need ultrasound every 6 months for [facility] surveillance.  The patient has been encouraged to follow up on a regular basis and continue his avoidance of alcohol.  The patient has been explained the plan and agrees with it.    Lucilla Lame, MD. Marval Regal    Note: This dictation was prepared with Dragon dictation along  with smaller phrase technology. Any transcriptional errors that result from this process are unintentional.

## 2020-11-01 ENCOUNTER — Encounter: Admission: RE | Payer: Self-pay | Source: Home / Self Care

## 2020-11-01 ENCOUNTER — Ambulatory Visit: Admission: RE | Admit: 2020-11-01 | Payer: 59 | Source: Home / Self Care | Admitting: Internal Medicine

## 2020-11-01 SURGERY — ESOPHAGOGASTRODUODENOSCOPY (EGD) WITH PROPOFOL
Anesthesia: General

## 2020-11-02 ENCOUNTER — Other Ambulatory Visit: Payer: Self-pay

## 2020-11-02 ENCOUNTER — Telehealth: Payer: Self-pay

## 2020-11-02 DIAGNOSIS — Z1211 Encounter for screening for malignant neoplasm of colon: Secondary | ICD-10-CM

## 2020-11-02 DIAGNOSIS — Z13810 Encounter for screening for upper gastrointestinal disorder: Secondary | ICD-10-CM

## 2020-11-02 LAB — ASH FIBROSURE
ALPHA 2-MACROGLOBULINS, QN: 317 mg/dL — ABNORMAL HIGH (ref 110–276)
ALT (SGPT) P5P: 16 IU/L (ref 0–55)
ASH Score: 0.03 (ref 0.00–17.00)
AST (SGOT) P5P: 17 IU/L (ref 0–40)
Apolipoprotein A-1: 135 mg/dL (ref 101–178)
Bilirubin, Total: 0.5 mg/dL (ref 0.0–1.2)
Cholesterol, Total: 209 mg/dL — ABNORMAL HIGH (ref 100–199)
Fibrosis Score: 0.67 — ABNORMAL HIGH (ref 0.00–0.21)
GGT: 146 IU/L — ABNORMAL HIGH (ref 0–65)
Glucose: 82 mg/dL (ref 65–99)
Haptoglobin: 111 mg/dL (ref 29–370)
Height: 74 in
Steatosis Score: 0.43 — ABNORMAL HIGH (ref 0.00–0.30)
Triglycerides: 139 mg/dL (ref 0–149)
Weight: 198 [lb_av]

## 2020-11-02 LAB — PROTIME-INR
INR: 1 (ref 0.9–1.2)
Prothrombin Time: 10.2 s (ref 9.1–12.0)

## 2020-11-02 NOTE — Telephone Encounter (Signed)
Pt notified of lab results through [place].

## 2020-11-02 NOTE — Telephone Encounter (Signed)
-----   Message from Lucilla Lame, MD sent at 11/02/2020  6:51 AM EDT ----- That the patient know that the blood test for his liver showed advanced liver disease but did not confirm the diagnosis of cirrhosis as was seen on the imaging.  He should continue to abstain from alcohol and follow-up of the time of his procedures.

## 2020-11-16 ENCOUNTER — Other Ambulatory Visit
Admission: RE | Admit: 2020-11-16 | Discharge: 2020-11-16 | Disposition: A | Discharge status: Home / Self Care | Payer: 59 | Source: Ambulatory Visit | Attending: Pulmonary Disease | Admitting: Pulmonary Disease

## 2020-11-16 ENCOUNTER — Other Ambulatory Visit: Payer: Self-pay

## 2020-11-16 ENCOUNTER — Encounter: Payer: Self-pay | Admitting: Pulmonary Disease

## 2020-11-16 ENCOUNTER — Ambulatory Visit (INDEPENDENT_AMBULATORY_CARE_PROVIDER_SITE_OTHER): Payer: 59 | Admitting: Pulmonary Disease

## 2020-11-16 VITALS — BP 128/78 | HR 64 | Temp 98.4°F | Ht 74.0 in | Wt 195.8 lb

## 2020-11-16 DIAGNOSIS — R911 Solitary pulmonary nodule: Secondary | ICD-10-CM | POA: Diagnosis present

## 2020-11-16 DIAGNOSIS — Z720 Tobacco use: Secondary | ICD-10-CM | POA: Diagnosis not present

## 2020-11-16 DIAGNOSIS — J432 Centrilobular emphysema: Secondary | ICD-10-CM | POA: Insufficient documentation

## 2020-11-16 NOTE — Patient Instructions (Signed)
Will arrange for lab test and pulmonary function test and call with results  Will schedule CT chest for April 2023 and arrange for follow up after this is done

## 2020-11-16 NOTE — Progress Notes (Signed)
[facility]  Chief Complaint  Patient presents with   pulmonary consult    Recent CT-- no current sx.     Constitutional:  BP 128/78 (BP Location: Left Arm, Cuff Size: Normal)   Pulse 64   Temp 98.4 F (36.9 C) (Oral)   Ht 6\' 2"  (1.88 m)   Wt 195 lb 12.8 oz (88.8 kg)   SpO2 99%   BMI 25.14 kg/m   Past Medical History:  DVT Lt leg 2015, DVT Rt leg 2021, DM, Back pain, HTN, HLD, OSA, Anxiety, ETOH, Cirrhosis, Ankle Osteomyelitis, Ulnar neuropathy, Hereditary spastic paraparesis  Past Surgical History:  He  has a past surgical history that includes Open reduction internal fixation (orif) tibia/fibula fracture (Left, July 2014); I & D extremity; Total hip arthroplasty (Right, 11/03/2018); and Total hip arthroplasty (Left, 02/11/2019).  Brief Summary:  Harold Tate is a 55 y.o. male smoker with lung nodule       Subjective:   He was seen by PCP recently for weight loss.  He had CT chest.  This showed changes of lung nodules and emphysema.  He has smoked for years.  Currently smokes 1/2 to 1 ppd.  He has extensive 2nd hand tobacco exposure.  He tried medicines before to help quit, but either didn't tolerate these or they didn't work.  He did well with nicotine patch before.  He works in Architect.  It is getting harder for him to keep up with work because of muscle weakness.  He is not having cough, wheeze, sputum, fever, chest pain, or hemoptysis.  Not having issues with cough or dyspnea while asleep.  Several of his family members had cancer and emphysema.  No history of pneumonia or exposure to tuberculosis.   He is followed at [facility] by neurology for paraparesis.  His sister has this also.  He is followed by gastroenterology for cirrhosis and colon polyps.  Physical Exam:   Appearance - well kempt   ENMT - no sinus tenderness, no oral exudate, no LAN, Mallampati 2 airway, no stridor  Respiratory - equal breath sounds  bilaterally, no wheezing or rales  CV - s1s2 regular rate and rhythm, no murmurs  Ext - no clubbing, no edema  Skin - no rashes  Psych - normal mood and affect   Pulmonary testing:    Chest Imaging:  CT chest 08/17/20 >> mild emphysema, 3 mm RML nodule, 4 mm lingular nodule  Social History:  He  reports that he has been smoking cigarettes. He has a 40.50 pack-year smoking history. He has never used smokeless tobacco. He reports current alcohol use of about 14.0 standard drinks of alcohol per week. He reports previous drug use. Drug: Benzodiazepines.  Family History:  His family history includes Depression in his sister.     Assessment/Plan:   Lung nodules with history of tobacco abuse. - will arrange for follow up CT chest w/o contrast for April 2023  Centrilobular emphysema. - will arrange for pulmonary function test and alpha 1 antitrypsin level - defer inhaler therapy for now  Tobacco abuse. - he was intolerant of wellbutrin - chantix wasn't effective before - he has done well with nicotine replacement before - he doesn't feel he is ready to quit yet  Hereditary spastic paraparesis. - followed by Dr. Martin[place] Lee Mayberry with [facility] neurology  Liver cirrhosis. - followed by Dr. Lucilla Lame with [facility]  Time Spent Involved in Patient Care on Day of  Examination:  48 minutes  Follow up:   Patient Instructions  Will arrange for lab test and pulmonary function test and call with results  Will schedule CT chest for April 2023 and arrange for follow up after this is done  Medication List:   Allergies as of 11/16/2020   No Known Allergies      Medication List        Accurate as of November 16, 2020 10:28 AM. If you have any questions, ask your nurse or doctor.          clonazePAM 0.5 MG tablet Commonly known as: KLONOPIN Take 1 tablet (0.5 mg total) by mouth 3 (three) times daily as needed for anxiety.   losartan 25 MG tablet Commonly  known as: COZAAR Take 25 mg by mouth daily.   metFORMIN 500 MG tablet Commonly known as: GLUCOPHAGE Take by mouth 2 (two) times daily with a meal.   rosuvastatin 5 MG tablet Commonly known as: CRESTOR Take 5 mg by mouth daily.   sildenafil 100 MG tablet Commonly known as: Viagra Take 0.5-1 tablets (50-100 mg total) by mouth daily as needed for erectile dysfunction.   vitamin B-12 1000 MCG tablet Commonly known as: CYANOCOBALAMIN Take 1 tablet (1,000 mcg total) by mouth daily.        Signature:  Chesley Mires, MD [place] Pager - [phone] 11/16/2020, 10:28 AM

## 2020-11-20 LAB — MISC LABCORP TEST (SEND OUT): Labcorp test code: 95653

## 2020-11-28 ENCOUNTER — Encounter: Payer: Self-pay | Admitting: *Deleted

## 2020-11-28 ENCOUNTER — Ambulatory Visit: Payer: 59 | Admitting: Internal Medicine

## 2020-12-04 ENCOUNTER — Other Ambulatory Visit: Payer: Self-pay | Admitting: Nurse Practitioner

## 2020-12-04 DIAGNOSIS — F411 Generalized anxiety disorder: Secondary | ICD-10-CM

## 2020-12-04 NOTE — Telephone Encounter (Signed)
Requested medication (s) are due for refill today: yes  Requested medication (s) are on the active medication list: yes   Last refill: 07/11/20  #90  2 refills  Future visit scheduled yes 12/05/20  Notes to clinic: not delegated  Requested Prescriptions  Pending Prescriptions Disp Refills   clonazePAM (KLONOPIN) 0.5 MG tablet [Pharmacy Med Name: CLONAZEPAM 0.5 MG TABLET] 90 tablet 0    Sig: Take 1 tablet (0.5 mg total) by mouth 3 (three) times daily as needed for anxiety.      Not Delegated - Psychiatry:  Anxiolytics/Hypnotics Failed - 12/04/2020  4:14 PM      Failed - This refill cannot be delegated      Passed - Urine Drug Screen completed in last 360 days      Passed - Valid encounter within last 6 months    Recent Outpatient Visits           2 months ago Whitaker, NP   3 months ago COVID-56   [employer], Lauren A, NP   3 months ago Hepatic cirrhosis, unspecified hepatic cirrhosis type, unspecified whether ascites present ([place])   [facility] Vigg, Avanti, MD   3 months ago Screening for lung cancer   [place] Vigg, Avanti, MD   4 months ago Type 2 diabetes mellitus with obesity ([place])   [place], Barbaraann Faster, NP       Future Appointments             Tomorrow Vigg, Loman Brooklyn, MD [facility], [place]

## 2020-12-05 ENCOUNTER — Ambulatory Visit (INDEPENDENT_AMBULATORY_CARE_PROVIDER_SITE_OTHER): Payer: 59 | Admitting: Nurse Practitioner

## 2020-12-05 ENCOUNTER — Encounter: Payer: Self-pay | Admitting: Nurse Practitioner

## 2020-12-05 ENCOUNTER — Other Ambulatory Visit: Payer: Self-pay

## 2020-12-05 VITALS — BP 140/83 | HR 86 | Temp 98.3°F | Ht 74.02 in | Wt 201.0 lb

## 2020-12-05 DIAGNOSIS — I152 Hypertension secondary to endocrine disorders: Secondary | ICD-10-CM

## 2020-12-05 DIAGNOSIS — E1169 Type 2 diabetes mellitus with other specified complication: Secondary | ICD-10-CM | POA: Diagnosis not present

## 2020-12-05 DIAGNOSIS — E1159 Type 2 diabetes mellitus with other circulatory complications: Secondary | ICD-10-CM

## 2020-12-05 DIAGNOSIS — R002 Palpitations: Secondary | ICD-10-CM

## 2020-12-05 DIAGNOSIS — E669 Obesity, unspecified: Secondary | ICD-10-CM

## 2020-12-05 DIAGNOSIS — F411 Generalized anxiety disorder: Secondary | ICD-10-CM | POA: Diagnosis not present

## 2020-12-05 LAB — BAYER DCA HB A1C WAIVED: HB A1C (BAYER DCA - WAIVED): 5.3 % (ref <7.0)

## 2020-12-05 MED ORDER — CLONAZEPAM 0.5 MG PO TABS
ORAL_TABLET | ORAL | 2 refills | Status: DC
Start: 2020-12-05 — End: 2021-03-26

## 2020-12-05 NOTE — Telephone Encounter (Signed)
Routing to provider. Patient last seen in May by Ander Purpura.

## 2020-12-05 NOTE — Progress Notes (Signed)
Established Patient Office Visit  Subjective:  Patient ID: Harold Tate, male    DOB: [birth year]  Age: 55 y.o. MRN: [id number]  CC:  Chief Complaint  Patient presents with   Colonoscopy    States that GI said that he needed to be cleared      HPI Harold Tate presents for clearance for colonoscopy and follow up on anxiety. When he was talking with the GI office about his colonoscopy, he mentioned that he had chest pain a few days ago. He states that he didn't have chest pain but could "feel his heart beat" off an on for about 3 days. Has happened before, but didn't last as long. It started when he was working outside in the heat. He tries to stay hydrated while working outside but he thinks it is just the heat that caused his symptoms. Denies shortness of breath, back pain, and nausea during the episode.   DIABETES  He states that he is not taking his losartan or metformin anymore. He states that he doesn't think he needs them, he was not having any side effects.   ANXIETY  He takes klonopin as needed for anxiety. He said that this helps keep his anxiety controlled and he did not tolerate maintenance anxiety medications in the past. He would like a refill on his klonopin today.   Depression screen [facility] 2/9 12/05/2020 07/11/2020 05/30/2020 04/26/2020 12/22/2019  Decreased Interest 0 0 1 2 0  Down, Depressed, Hopeless 0 0 3 2 0  PHQ - 2 Score 0 0 4 4 0  Altered sleeping 0 0 _0 Tired, decreased energy 0 0 3 3 0  Change in appetite 0 0 0 0 0  Feeling bad or failure about yourself  0 0 1 0 0  Trouble concentrating 0 0 1 1 0  Moving slowly or fidgety/restless 0 0 0 0 2  Suicidal thoughts 0 0 1 0 0  PHQ-9 Score 0 0 _1 Difficult doing work/chores Not difficult at all - - - -  Some recent data might be hidden     GAD 7 : Generalized Anxiety Score 12/05/2020 04/26/2020 12/22/2019 06/10/2019  Nervous, Anxious, on Edge _2 Control/stop worrying _3 Worry too much -  different things _4 Trouble relaxing 3 0 0 0  Restless 1 1 0 0  Easily annoyed or irritable 1 1 0 0  Afraid - awful might happen _5 0  Total GAD 7 Score _6 Anxiety Difficulty Not difficult at all - - Not difficult at all      Past Medical History:  Diagnosis Date   Anxiety    Arthritis    Diabetes mellitus without complication ([place])    Patient states A1C has gone down, no longer a diabetic   Dyspnea    Hip pain    Hypertension    Raynaud's syndrome    Sleep apnea     CPAP/ Doesn't use    Past Surgical History:  Procedure Laterality Date   I & D EXTREMITY     OPEN REDUCTION INTERNAL FIXATION (ORIF) TIBIA/FIBULA FRACTURE Left July 2014   TOTAL HIP ARTHROPLASTY Right 11/03/2018   Procedure: RIGHT TOTAL HIP ARTHROPLASTY ANTERIOR APPROACH;  Surgeon: Hessie Knows, MD;  Location: [facility] ORS;  Service: Orthopedics;  Laterality: Right;   TOTAL HIP ARTHROPLASTY Left 02/11/2019   Procedure: TOTAL HIP  ARTHROPLASTY ANTERIOR APPROACH;  Surgeon: Hessie Knows, MD;  Location: [facility] ORS;  Service: Orthopedics;  Laterality: Left;    Family History  Problem Relation Age of Onset   Depression Sister     Social History   Socioeconomic History   Marital status: Married    Spouse name: Not on file   Number of children: Not on file   Years of education: Not on file   Highest education level: Not on file  Occupational History   Not on file  Tobacco Use   Smoking status: Every Day    Packs/day: 1.50    Years: 27.00    Pack years: 40.50    Types: Cigarettes   Smokeless tobacco: Never   Tobacco comments:    1.5PPD 11/16/2020  Vaping Use   Vaping Use: Never used  Substance and Sexual Activity   Alcohol use: Yes    Alcohol/week: 14.0 standard drinks    Types: 14 Cans of beer per week   Drug use: Not Currently    Types: Benzodiazepines   Sexual activity: Not on file  Other Topics Concern   Not on file  Social History Narrative   Not on file   Social Determinants  of Health   Financial Resource Strain: Not on file  Food Insecurity: Not on file  Transportation Needs: Not on file  Physical Activity: Not on file  Stress: Not on file  Social Connections: Not on file  Intimate Partner Violence: Not on file    Outpatient Medications Prior to Visit  Medication Sig Dispense Refill   sildenafil (VIAGRA) 100 MG tablet Take 0.5-1 tablets (50-100 mg total) by mouth daily as needed for erectile dysfunction. (Patient not taking: Reported on 12/06/2020) 5 tablet 11   vitamin B-12 (CYANOCOBALAMIN) 1000 MCG tablet Take 1 tablet (1,000 mcg total) by mouth daily. 90 tablet 4   clonazePAM (KLONOPIN) 0.5 MG tablet Take 1 tablet (0.5 mg total) by mouth 3 (three) times daily as needed for anxiety. 90 tablet 2   losartan (COZAAR) 25 MG tablet Take 25 mg by mouth daily. (Patient not taking: No sig reported)     metFORMIN (GLUCOPHAGE) 500 MG tablet Take by mouth 2 (two) times daily with a meal. (Patient not taking: No sig reported)     rosuvastatin (CRESTOR) 5 MG tablet Take 5 mg by mouth daily. (Patient not taking: No sig reported)     No facility-administered medications prior to visit.    No Known Allergies  ROS Review of Systems  Constitutional:  Positive for fatigue.  Respiratory: Negative.    Cardiovascular:  Positive for palpitations. Negative for chest pain and leg swelling.  Gastrointestinal: Negative.   Endocrine: Negative.   Genitourinary: Negative.   Skin: Negative.   Neurological: Negative.   Psychiatric/Behavioral:  The patient is nervous/anxious (controlled with klonopin).      Objective:    Physical Exam Vitals and nursing note reviewed.  Constitutional:      Appearance: Normal appearance.  HENT:     Head: Normocephalic.  Eyes:     Conjunctiva/sclera: Conjunctivae normal.  Cardiovascular:     Rate and Rhythm: Normal rate and regular rhythm.     Pulses: Normal pulses.     Heart sounds: Normal heart sounds.  Pulmonary:     Effort:  Pulmonary effort is normal.     Breath sounds: Normal breath sounds.  Abdominal:     Palpations: Abdomen is soft.     Tenderness: There is no abdominal tenderness.  Musculoskeletal:  Cervical back: Normal range of motion.  Skin:    General: Skin is warm and dry.  Neurological:     General: No focal deficit present.     Mental Status: He is alert and oriented to person, place, and time.  Psychiatric:        Mood and Affect: Mood normal.        Behavior: Behavior normal.        Thought Content: Thought content normal.        Judgment: Judgment normal.    BP 140/83   Pulse 86   Temp 98.3 F (36.8 C) (Oral)   Ht 6' 2.02" (1.88 m)   Wt 201 lb (91.2 kg)   SpO2 96%   BMI 25.80 kg/m  Wt Readings from Last 3 Encounters:  12/05/20 201 lb (91.2 kg)  11/16/20 195 lb 12.8 oz (88.8 kg)  10/31/20 198 lb 6.4 oz (90 kg)     Health Maintenance Due  Topic Date Due   COVID-19 Vaccine (1) Never done   OPHTHALMOLOGY EXAM  Never done   Zoster Vaccines- Shingrix (1 of 2) Never done   INFLUENZA VACCINE  11/27/2020    There are no preventive care reminders to display for this patient.  Lab Results  Component Value Date   TSH 2.670 09/22/2020   Lab Results  Component Value Date   WBC 6.7 12/05/2020   HGB 14.7 12/05/2020   HCT 42.6 12/05/2020   MCV 96 12/05/2020   PLT 165 12/05/2020   Lab Results  Component Value Date   NA 140 12/05/2020   K 4.4 12/05/2020   CO2 24 12/05/2020   GLUCOSE 98 12/05/2020   BUN 11 12/05/2020   CREATININE 0.84 12/05/2020   BILITOT 0.4 12/05/2020   ALKPHOS 124 (H) 12/05/2020   AST 18 12/05/2020   ALT 17 12/05/2020   PROT 7.0 12/05/2020   ALBUMIN 4.2 12/05/2020   CALCIUM 9.7 12/05/2020   ANIONGAP 8 02/12/2019   EGFR 103 12/05/2020   Lab Results  Component Value Date   CHOL 209 (H) 10/31/2020   Lab Results  Component Value Date   HDL 46 09/22/2020   Lab Results  Component Value Date   LDLCALC 128 (H) 09/22/2020   Lab Results   Component Value Date   TRIG 139 10/31/2020   Lab Results  Component Value Date   CHOLHDL 4.2 09/22/2020   Lab Results  Component Value Date   HGBA1C 5.3 12/05/2020      Assessment & Plan:   Problem List Items Addressed This Visit       Cardiovascular and Mediastinum   Hypertension associated with diabetes ([place]) - Primary    Blood pressure slightly elevated today at 140/83. Encouraged him to check his blood pressure at home as he has stopped taking his losartan. Follow up in 3 months.        Relevant Orders   CBC with Differential/Platelet (Completed)   Comprehensive metabolic panel (Completed)     Endocrine   Type 2 diabetes mellitus with obesity (H[facility])    A1C 5.3% on no diabetic medications. Continue watching diet and exercise. Will stop the metformin. Follow up in 3 months.        Relevant Orders   CBC with Differential/Platelet (Completed)   Comprehensive metabolic panel (Completed)   Bayer DCA Hb A1c Waived (Completed)     Other   Generalized anxiety disorder (Chronic)    Chronic, ongoing. He has been on klonopin long-term from prior  PCP. He states that he has tried multiple other SSRI and buspar to help anxiety and didn't tolerate them. Discussed the benefits and risks of long term benzo use and he wishes to continue. PDMP reviewed. Refill sent for #90 plus 2 refills. Follow-up in 3 months.        Relevant Medications   clonazePAM (KLONOPIN) 0.5 MG tablet   Palpitation    No palpitations in the last few days. EKG showed normal sinus rhythm, no ST or T wave abnormalities. May be related to dehydration and working outside. Check CMP and CBC.   Addendum 12/06/20 1215: CMP and CBC stable. Symptoms have resolved, does not need referral to cardiology at this point. Denied chest pain. From my standpoint, he is low risk cardiovascular wise for colonoscopy.        Relevant Orders   EKG 12-Lead (Completed)    Meds ordered this encounter  Medications   clonazePAM  (KLONOPIN) 0.5 MG tablet    Sig: Take 1 tablet (0.5 mg total) by mouth 3 (three) times daily as needed for anxiety.    Dispense:  90 tablet    Refill:  2     Follow-up: Return in about 3 months (around 03/07/2021) for anxiety, htn, with Dr. Neomia Dear.    Charyl Dancer, NP

## 2020-12-05 NOTE — Progress Notes (Signed)
EKG interpreted by me on 12/05/20 showed normal sinus rhythm with a  rate of 66. No ST or T wave changes

## 2020-12-06 ENCOUNTER — Encounter: Payer: Self-pay | Admitting: Gastroenterology

## 2020-12-06 ENCOUNTER — Encounter: Payer: Self-pay | Admitting: Nurse Practitioner

## 2020-12-06 DIAGNOSIS — R002 Palpitations: Secondary | ICD-10-CM | POA: Insufficient documentation

## 2020-12-06 LAB — CBC WITH DIFFERENTIAL/PLATELET
Basophils Absolute: 0.1 10*3/uL (ref 0.0–0.2)
Basos: 2 %
EOS (ABSOLUTE): 0.3 10*3/uL (ref 0.0–0.4)
Eos: 5 %
Hematocrit: 42.6 % (ref 37.5–51.0)
Hemoglobin: 14.7 g/dL (ref 13.0–17.7)
Immature Grans (Abs): 0 10*3/uL (ref 0.0–0.1)
Immature Granulocytes: 0 %
Lymphocytes Absolute: 1.8 10*3/uL (ref 0.7–3.1)
Lymphs: 28 %
MCH: 33.2 pg — ABNORMAL HIGH (ref 26.6–33.0)
MCHC: 34.5 g/dL (ref 31.5–35.7)
MCV: 96 fL (ref 79–97)
Monocytes Absolute: 0.7 10*3/uL (ref 0.1–0.9)
Monocytes: 11 %
Neutrophils Absolute: 3.7 10*3/uL (ref 1.4–7.0)
Neutrophils: 54 %
Platelets: 165 10*3/uL (ref 150–450)
RBC: 4.43 x10E6/uL (ref 4.14–5.80)
RDW: 12.8 % (ref 11.6–15.4)
WBC: 6.7 10*3/uL (ref 3.4–10.8)

## 2020-12-06 LAB — COMPREHENSIVE METABOLIC PANEL
ALT: 17 IU/L (ref 0–44)
AST: 18 IU/L (ref 0–40)
Albumin/Globulin Ratio: 1.5 (ref 1.2–2.2)
Albumin: 4.2 g/dL (ref 3.8–4.9)
Alkaline Phosphatase: 124 IU/L — ABNORMAL HIGH (ref 44–121)
BUN/Creatinine Ratio: 13 (ref 9–20)
BUN: 11 mg/dL (ref 6–24)
Bilirubin Total: 0.4 mg/dL (ref 0.0–1.2)
CO2: 24 mmol/L (ref 20–29)
Calcium: 9.7 mg/dL (ref 8.7–10.2)
Chloride: 101 mmol/L (ref 96–106)
Creatinine, Ser: 0.84 mg/dL (ref 0.76–1.27)
Globulin, Total: 2.8 g/dL (ref 1.5–4.5)
Glucose: 98 mg/dL (ref 65–99)
Potassium: 4.4 mmol/L (ref 3.5–5.2)
Sodium: 140 mmol/L (ref 134–144)
Total Protein: 7 g/dL (ref 6.0–8.5)
eGFR: 103 mL/min/{1.73_m2} (ref >59)

## 2020-12-06 NOTE — Assessment & Plan Note (Signed)
Blood pressure slightly elevated today at 140/83. Encouraged him to check his blood pressure at home as he has stopped taking his losartan. Follow up in 3 months.

## 2020-12-06 NOTE — Assessment & Plan Note (Signed)
Chronic, ongoing. He has been on klonopin long-term from prior PCP. He states that he has tried multiple other SSRI and buspar to help anxiety and didn't tolerate them. Discussed the benefits and risks of long term benzo use and he wishes to continue. PDMP reviewed. Refill sent for #90 plus 2 refills. Follow-up in 3 months.

## 2020-12-06 NOTE — Assessment & Plan Note (Signed)
No palpitations in the last few days. EKG showed normal sinus rhythm, no ST or T wave abnormalities. May be related to dehydration and working outside. Check CMP and CBC.   Addendum 12/06/20 1215: CMP and CBC stable. Symptoms have resolved, does not need referral to cardiology at this point. Denied chest pain. From my standpoint, he is low risk cardiovascular wise for colonoscopy.

## 2020-12-06 NOTE — Assessment & Plan Note (Signed)
A1C 5.3% on no diabetic medications. Continue watching diet and exercise. Will stop the metformin. Follow up in 3 months.

## 2020-12-08 ENCOUNTER — Ambulatory Visit: Payer: 59 | Admitting: Anesthesiology

## 2020-12-08 ENCOUNTER — Encounter
Admission: RE | Disposition: A | Discharge status: Home / Self Care | Payer: Self-pay | Source: Home / Self Care | Attending: Gastroenterology

## 2020-12-08 ENCOUNTER — Ambulatory Visit
Admission: RE | Admit: 2020-12-08 | Discharge: 2020-12-08 | Disposition: A | Discharge status: Home / Self Care | Payer: 59 | Attending: Gastroenterology | Admitting: Gastroenterology

## 2020-12-08 ENCOUNTER — Encounter: Payer: Self-pay | Admitting: Gastroenterology

## 2020-12-08 ENCOUNTER — Other Ambulatory Visit: Payer: Self-pay

## 2020-12-08 DIAGNOSIS — K766 Portal hypertension: Secondary | ICD-10-CM | POA: Insufficient documentation

## 2020-12-08 DIAGNOSIS — F17210 Nicotine dependence, cigarettes, uncomplicated: Secondary | ICD-10-CM | POA: Diagnosis not present

## 2020-12-08 DIAGNOSIS — D122 Benign neoplasm of ascending colon: Secondary | ICD-10-CM | POA: Diagnosis not present

## 2020-12-08 DIAGNOSIS — Z1211 Encounter for screening for malignant neoplasm of colon: Secondary | ICD-10-CM | POA: Diagnosis present

## 2020-12-08 DIAGNOSIS — K5730 Diverticulosis of large intestine without perforation or abscess without bleeding: Secondary | ICD-10-CM | POA: Insufficient documentation

## 2020-12-08 DIAGNOSIS — D123 Benign neoplasm of transverse colon: Secondary | ICD-10-CM | POA: Diagnosis not present

## 2020-12-08 DIAGNOSIS — Z96643 Presence of artificial hip joint, bilateral: Secondary | ICD-10-CM | POA: Diagnosis not present

## 2020-12-08 DIAGNOSIS — I868 Varicose veins of other specified sites: Secondary | ICD-10-CM | POA: Insufficient documentation

## 2020-12-08 DIAGNOSIS — D125 Benign neoplasm of sigmoid colon: Secondary | ICD-10-CM | POA: Insufficient documentation

## 2020-12-08 DIAGNOSIS — Z13810 Encounter for screening for upper gastrointestinal disorder: Secondary | ICD-10-CM

## 2020-12-08 DIAGNOSIS — K635 Polyp of colon: Secondary | ICD-10-CM

## 2020-12-08 DIAGNOSIS — K7030 Alcoholic cirrhosis of liver without ascites: Secondary | ICD-10-CM | POA: Diagnosis not present

## 2020-12-08 DIAGNOSIS — Z8616 Personal history of COVID-19: Secondary | ICD-10-CM | POA: Insufficient documentation

## 2020-12-08 DIAGNOSIS — I8510 Secondary esophageal varices without bleeding: Secondary | ICD-10-CM | POA: Insufficient documentation

## 2020-12-08 DIAGNOSIS — K3189 Other diseases of stomach and duodenum: Secondary | ICD-10-CM | POA: Diagnosis not present

## 2020-12-08 DIAGNOSIS — K7460 Unspecified cirrhosis of liver: Secondary | ICD-10-CM | POA: Diagnosis present

## 2020-12-08 HISTORY — PX: ESOPHAGOGASTRODUODENOSCOPY (EGD) WITH PROPOFOL: SHX5813

## 2020-12-08 HISTORY — PX: POLYPECTOMY: SHX5525

## 2020-12-08 HISTORY — PX: COLONOSCOPY WITH PROPOFOL: SHX5780

## 2020-12-08 SURGERY — COLONOSCOPY WITH PROPOFOL
Anesthesia: General

## 2020-12-08 MED ORDER — ONDANSETRON HCL 4 MG/2ML IJ SOLN: 4.0000 mg | Freq: Once | INTRAMUSCULAR | Status: DC | PRN

## 2020-12-08 MED ORDER — ACETAMINOPHEN 325 MG PO TABS: 325.0000 mg | ORAL_TABLET | ORAL | Status: DC | PRN

## 2020-12-08 MED ORDER — LIDOCAINE HCL (CARDIAC) PF 100 MG/5ML IV SOSY
PREFILLED_SYRINGE | INTRAVENOUS | Status: DC | PRN
  Administered 2020-12-08: 30 mg via INTRAVENOUS

## 2020-12-08 MED ORDER — SODIUM CHLORIDE 0.9 % IV SOLN: INTRAVENOUS | Status: DC

## 2020-12-08 MED ORDER — ACETAMINOPHEN 160 MG/5ML PO SOLN: 325.0000 mg | ORAL | Status: DC | PRN

## 2020-12-08 MED ORDER — GLYCOPYRROLATE 0.2 MG/ML IJ SOLN
INTRAMUSCULAR | Status: DC | PRN
  Administered 2020-12-08: .1 mg via INTRAVENOUS

## 2020-12-08 MED ORDER — STERILE WATER FOR IRRIGATION IR SOLN
Status: DC | PRN
  Administered 2020-12-08: 150 mL

## 2020-12-08 MED ORDER — LACTATED RINGERS IV SOLN: INTRAVENOUS | Status: DC

## 2020-12-08 MED ORDER — PROPOFOL 10 MG/ML IV BOLUS
INTRAVENOUS | Status: DC | PRN
  Administered 2020-12-08 (×2): 40 mg via INTRAVENOUS
  Administered 2020-12-08: 50 mg via INTRAVENOUS
  Administered 2020-12-08 (×2): 40 mg via INTRAVENOUS
  Administered 2020-12-08: 20 mg via INTRAVENOUS
  Administered 2020-12-08: 150 mg via INTRAVENOUS
  Administered 2020-12-08: 20 mg via INTRAVENOUS
  Administered 2020-12-08 (×3): 40 mg via INTRAVENOUS
  Administered 2020-12-08: 20 mg via INTRAVENOUS
  Administered 2020-12-08: 40 mg via INTRAVENOUS
  Administered 2020-12-08: 50 mg via INTRAVENOUS

## 2020-12-08 SURGICAL SUPPLY — 39 items
BALLN DILATOR 10-12 8 (BALLOONS)
BALLN DILATOR 12-15 8 (BALLOONS)
BALLN DILATOR 15-18 8 (BALLOONS)
BALLN DILATOR CRE 0-12 8 (BALLOONS)
BALLN DILATOR ESOPH 8 10 CRE (MISCELLANEOUS) IMPLANT
BALLOON DILATOR 12-15 8 (BALLOONS) IMPLANT
BALLOON DILATOR 15-18 8 (BALLOONS) IMPLANT
BALLOON DILATOR CRE 0-12 8 (BALLOONS) IMPLANT
BLOCK BITE 60FR ADLT L/F GRN (MISCELLANEOUS) ×2 IMPLANT
CLIP HMST 235XBRD CATH ROT (MISCELLANEOUS) ×1 IMPLANT
CLIP RESOLUTION 360 11X235 (MISCELLANEOUS) ×2
ELECT REM PT RETURN 9FT ADLT (ELECTROSURGICAL)
ELECTRODE REM PT RTRN 9FT ADLT (ELECTROSURGICAL) IMPLANT
FCP ESCP3.2XJMB 240X2.8X (MISCELLANEOUS)
FORCEPS BIOP RAD 4 LRG CAP 4 (CUTTING FORCEPS) IMPLANT
FORCEPS BIOP RJ4 240 W/NDL (MISCELLANEOUS)
FORCEPS ESCP3.2XJMB 240X2.8X (MISCELLANEOUS) IMPLANT
GOWN CVR UNV OPN BCK APRN NK (MISCELLANEOUS) ×4 IMPLANT
GOWN ISOL THUMB LOOP REG UNIV (MISCELLANEOUS) ×8
INJECTOR VARIJECT VIN23 (MISCELLANEOUS) ×1 IMPLANT
KIT DEFENDO VALVE AND CONN (KITS) IMPLANT
KIT PRC NS LF DISP ENDO (KITS) ×2 IMPLANT
KIT PROCEDURE OLYMPUS (KITS) ×4
MANIFOLD NEPTUNE II (INSTRUMENTS) ×4 IMPLANT
MARKER SPOT ENDO TATTOO 5ML (MISCELLANEOUS) IMPLANT
PROBE APC STR FIRE (PROBE) IMPLANT
RETRIEVER NET PLAT FOOD (MISCELLANEOUS) IMPLANT
RETRIEVER NET ROTH 2.5X230 LF (MISCELLANEOUS) IMPLANT
SNARE COLD EXACTO (MISCELLANEOUS) ×2 IMPLANT
SNARE LASSO HEX 3 IN 1 (INSTRUMENTS) ×2 IMPLANT
SNARE SHORT THROW 13M SML OVAL (MISCELLANEOUS) IMPLANT
SNARE SHORT THROW 30M LRG OVAL (MISCELLANEOUS) IMPLANT
SNARE SNG USE RND 15MM (INSTRUMENTS) IMPLANT
SPOT EX ENDOSCOPIC TATTOO (MISCELLANEOUS)
SYR INFLATION 60ML (SYRINGE) IMPLANT
TRAP ETRAP POLY (MISCELLANEOUS) ×2 IMPLANT
VARIJECT INJECTOR VIN23 (MISCELLANEOUS) ×2
WATER STERILE IRR 250ML POUR (IV SOLUTION) ×4 IMPLANT
WIRE CRE 18-20MM 8CM F G (MISCELLANEOUS) IMPLANT

## 2020-12-08 NOTE — Anesthesia Preprocedure Evaluation (Signed)
Anesthesia Evaluation  Patient identified by MRN, date of birth, ID band Patient awake    Reviewed: Allergy & Precautions, H&P , NPO status , Patient's Chart, lab work & pertinent test results  History of Anesthesia Complications Negative for: history of anesthetic complications  Airway Mallampati: III  TM Distance: >3 FB     Dental no notable dental hx. (+) Missing, Caps   Pulmonary shortness of breath, sleep apnea , COPD, neg recent URI, Current Smoker,    Pulmonary exam normal breath sounds clear to auscultation       Cardiovascular Exercise Tolerance: Good hypertension, (-) angina(-) Past MI and (-) Cardiac Stents Normal cardiovascular exam(-) dysrhythmias  Rhythm:Regular Rate:Normal     Neuro/Psych PSYCHIATRIC DISORDERS Anxiety Depression negative neurological ROS     GI/Hepatic negative GI ROS, (+)     substance abuse (reports an average 6 drinks/day)  alcohol use,   Endo/Other  diabetes  Renal/GU      Musculoskeletal  (+) Arthritis ,   Abdominal Normal abdominal exam  (+) - obese,  Abdomen: soft.    Peds  Hematology negative hematology ROS (+)   Anesthesia Other Findings Obese  Past Medical History: No date: Anxiety No date: Arthritis No date: Diabetes mellitus without complication (HCC) No date: Dyspnea No date: Hip pain No date: Hypertension No date: Raynaud's syndrome No date: Sleep apnea  Past Surgical History: No date: I&D EXTREMITY July 2014: OPEN REDUCTION INTERNAL FIXATION (ORIF) TIBIA/FIBULA  FRACTURE; Left     Reproductive/Obstetrics negative OB ROS                             Anesthesia Physical  Anesthesia Plan  ASA: III  Anesthesia Plan: General   Post-op Pain Management:    Induction: Intravenous  PONV Risk Score and Plan: 1 and Propofol infusion and TIVA  Airway Management Planned: Natural Airway  Additional Equipment:   Intra-op  Plan:   Post-operative Plan:   Informed Consent: I have reviewed the patients History and Physical, chart, labs and discussed the procedure including the risks, benefits and alternatives for the proposed anesthesia with the patient or authorized representative who has indicated his/her understanding and acceptance.     Dental Advisory Given  Plan Discussed with: Anesthesiologist and CRNA  Anesthesia Plan Comments:         Anesthesia Quick Evaluation  Patient Active Problem List   Diagnosis Date Noted  . Palpitation 12/06/2020  . COVID-19 09/05/2020  . Alcoholic cirrhosis of liver without ascites ([place]) 08/24/2020  . Family history of polyps in the colon 08/24/2020  . Portal venous hypertension ([place]) 08/24/2020  . Thrombocytopenia ([place]) 05/31/2020  . B12 deficiency 04/24/2020  . Hereditary spastic paraparesis ([place]) 04/22/2020  . Right leg DVT ([place]) 09/03/2019  . Long-term current use of benzodiazepine 06/10/2019  . Status post total hip replacement, left 02/11/2019  . Depression, major, single episode, moderate ([place]) 01/11/2019  . Sexual dysfunction 11/19/2018  . Status post total hip replacement, right 11/03/2018  . Osteoarthritis of both hips 10/01/2018  . Type 2 diabetes mellitus with obesity ([place]) 11/05/2016  . Hyperlipidemia associated with type 2 diabetes mellitus ([place]) 06/06/2016  . Raynaud's syndrome 09/30/2014  . Generalized anxiety disorder 09/30/2014  . Nicotine dependence, cigarettes, uncomplicated 0000000  . Hypertension associated with diabetes ([place]) 09/30/2014    CBC Latest Ref Rng & Units 12/05/2020 09/22/2020 08/16/2020  WBC 3.4 - 10.8 x10E3/uL 6.7 8.3 6.6  Hemoglobin 13.0 - 17.7  g/dL 14.7 14.7 16.0  Hematocrit 37.5 - 51.0 % 42.6 43.8 46.4  Platelets 150 - 450 x10E3/uL 165 156 169   BMP Latest Ref Rng & Units 12/05/2020 09/22/2020 08/18/2020  Glucose 65 - 99 mg/dL 98 164(H) 96  BUN 6 - 24 mg/dL '11 7 7  '$ Creatinine 0.76 - 1.27 mg/dL 0.84 0.77 0.86   BUN/Creat Ratio 9 - '20 13 9 '$ 8(L)  Sodium 134 - 144 mmol/L 140 138 139  Potassium 3.5 - 5.2 mmol/L 4.4 3.9 4.6  Chloride 96 - 106 mmol/L 101 99 100  CO2 20 - 29 mmol/L '24 23 21  '$ Calcium 8.7 - 10.2 mg/dL 9.7 9.4 9.6    Risks and benefits of anesthesia discussed at length, patient or surrogate demonstrates understanding. Appropriately NPO. Plan to proceed with anesthesia.  Champ Mungo, MD 12/08/20

## 2020-12-08 NOTE — Op Note (Signed)
[facility] Gastroenterology Patient Name: Harold Tate Procedure Date: 12/08/2020 10:13 AM MRN: [id number] Account #: [id number] Date of Birth: August 17, 1965 Admit Type: Outpatient Age: 55 Room: [facility] OR ROOM 1 Gender: Male Note Status: Finalized Procedure:             Upper GI endoscopy Indications:           Cirrhosis rule out esophageal varices Providers:             Lucilla Lame MD, MD Referring MD:          Charlynne Cousins (Referring MD) Medicines:             Propofol per Anesthesia Complications:         No immediate complications. Procedure:             Pre-Anesthesia Assessment:                        - Prior to the procedure, a History and Physical was                         performed, and patient medications and allergies were                         reviewed. The patient's tolerance of previous                         anesthesia was also reviewed. The risks and benefits                         of the procedure and the sedation options and risks                         were discussed with the patient. All questions were                         answered, and informed consent was obtained. Prior                         Anticoagulants: The patient has taken no previous                         anticoagulant or antiplatelet agents. ASA Grade                         Assessment: II - A patient with mild systemic disease.                         After reviewing the risks and benefits, the patient                         was deemed in satisfactory condition to undergo the                         procedure.                        After obtaining informed consent, the endoscope was  passed under direct vision. Throughout the procedure,                         the patient's blood pressure, pulse, and oxygen                         saturations were monitored continuously. The Endoscope                         was introduced through the mouth, and  advanced to the                         second part of duodenum. The upper GI endoscopy was                         accomplished without difficulty. The patient tolerated                         the procedure well. Findings:      Grade I varices were found in the lower third of the esophagus.      Portal hypertensive gastropathy was found in the entire examined stomach.      The examined duodenum was normal. Impression:            - Grade I esophageal varices.                        - Portal hypertensive gastropathy.                        - Normal examined duodenum.                        - No specimens collected. Recommendation:        - Discharge patient to home.                        - Resume previous diet.                        - Continue present medications.                        - Perform a colonoscopy today. Procedure Code(s):     --- Professional ---                        [phone], Esophagogastroduodenoscopy, flexible,                         transoral; diagnostic, including collection of                         specimen(s) by brushing or washing, when performed                         (separate procedure) Diagnosis Code(s):     --- Professional ---                        I85.10, Secondary esophageal varices without bleeding  K76.6, Portal hypertension CPT copyright 2019 American Medical Association. All rights reserved. The codes documented in this report are preliminary and upon coder review may  be revised to meet current compliance requirements. Lucilla Lame MD, MD 12/08/2020 10:25:39 AM This report has been signed electronically. Number of Addenda: 0 Note Initiated On: 12/08/2020 10:13 AM Total Procedure Duration: 0 hours 2 minutes 36 seconds  Estimated Blood Loss:  Estimated blood loss: none.      [facility]

## 2020-12-08 NOTE — Op Note (Signed)
[facility]enter[facility] Patient Name: Harold Tate Procedure Date: 12/08/2020 10:12 AM MRN: [id number] Account #: [id number] Date of Birth: 1965/11/21 Admit Type: Outpatient Age: 55 Room: [facility] OR ROOM 01 Gender: Male Note Status: Finalized Procedure:             Colonoscopy Indications:           Screening for colorectal malignant neoplasm Providers:             Lucilla Lame MD, MD Medicines:             Propofol per Anesthesia Complications:         No immediate complications. Procedure:             Pre-Anesthesia Assessment:                        - Prior to the procedure, a History and Physical was                         performed, and patient medications and allergies were                         reviewed. The patient's tolerance of previous                         anesthesia was also reviewed. The risks and benefits                         of the procedure and the sedation options and risks                         were discussed with the patient. All questions were                         answered, and informed consent was obtained. Prior                         Anticoagulants: The patient has taken no previous                         anticoagulant or antiplatelet agents. ASA Grade                         Assessment: II - A patient with mild systemic disease.                         After reviewing the risks and benefits, the patient                         was deemed in satisfactory condition to undergo the                         procedure.                        After obtaining informed consent, the colonoscope was                         passed under direct vision. Throughout the procedure,  the patient's blood pressure, pulse, and oxygen                         saturations were monitored continuously. The was                         introduced through the anus and advanced to the the                         cecum, identified  by appendiceal orifice and ileocecal                         valve. The colonoscopy was performed without                         difficulty. The patient tolerated the procedure well.                         The quality of the bowel preparation was good. Findings:      The perianal and digital rectal examinations were normal.      A 10 mm polyp was found in the ascending colon. The polyp was sessile.       Preparations were made for mucosal resection. Chromoscopy with indigo       carmine was done to mark the borders of the lesion. Saline with indigo       carmine was injected to raise the lesion. Snare mucosal resection was       performed. Resection and retrieval were complete. To close a defect       after polypectomy, one hemostatic clip was successfully placed (MR       conditional). There was no bleeding at the end of the procedure.      Three sessile polyps were found in the transverse colon. The polyps were       4 to 7 mm in size. These polyps were removed with a cold snare.       Resection and retrieval were complete.      Two sessile polyps were found in the sigmoid colon. The polyps were 3 to       4 mm in size. These polyps were removed with a cold snare. Resection and       retrieval were complete.      Non-bleeding rectal varices were found.      Multiple small-mouthed diverticula were found in the sigmoid colon. Impression:            - One 10 mm polyp in the ascending colon, removed with                         mucosal resection. Resected and retrieved. Clip (MR                         conditional) was placed.                        - Three 4 to 7 mm polyps in the transverse colon,                         removed with a cold snare. Resected and retrieved.                        -  Two 3 to 4 mm polyps in the sigmoid colon, removed                         with a cold snare. Resected and retrieved.                        - Rectal varices.                        -  Diverticulosis in the sigmoid colon.                        - Mucosal resection was performed. Resection and                         retrieval were complete. Recommendation:        - Discharge patient to home.                        - Resume previous diet.                        - Continue present medications.                        - Await pathology results.                        - Repeat colonoscopy in 3 years for surveillance if                         adenomatous otherwise 10 years. Procedure Code(s):     --- Professional ---                        [phone], Colonoscopy, flexible; with endoscopic mucosal                         resection                        [id number], 69, Colonoscopy, flexible; with removal of                         tumor(s), polyp(s), or other lesion(s) by snare                         technique Diagnosis Code(s):     --- Professional ---                        Z12.11, Encounter for screening for malignant neoplasm                         of colon                        K63.5, Polyp of colon CPT copyright 2019 American Medical Association. All rights reserved. The codes documented in this report are preliminary and upon coder review may  be revised to meet current compliance requirements. Lucilla Lame MD, MD 12/08/2020 11:00:39 AM This report has been signed electronically. Number of Addenda: 0 Note Initiated On:  12/08/2020 10:12 AM Scope Withdrawal Time: 0 hours 21 minutes 19 seconds  Total Procedure Duration: 0 hours 28 minutes 43 seconds  Estimated Blood Loss:  Estimated blood loss: none.      [facility]

## 2020-12-08 NOTE — Anesthesia Postprocedure Evaluation (Signed)
Anesthesia Post Note  Patient: Harold Tate  Procedure(s) Performed: COLONOSCOPY WITH PROPOFOL POLYPECTOMY ESOPHAGOGASTRODUODENOSCOPY (EGD) WITH PROPOFOL     Patient location during evaluation: PACU Anesthesia Type: General Level of consciousness: awake and alert Pain management: pain level controlled Vital Signs Assessment: post-procedure vital signs reviewed and stable Respiratory status: spontaneous breathing, nonlabored ventilation, respiratory function stable and patient connected to nasal cannula oxygen Cardiovascular status: stable and blood pressure returned to baseline Postop Assessment: no apparent nausea or vomiting Anesthetic complications: no   No notable events documented.  Sinda Du

## 2020-12-08 NOTE — Anesthesia Procedure Notes (Signed)
Date/Time: 12/08/2020 10:16 AM Performed by: Cameron Ali, CRNA Pre-anesthesia Checklist: Patient identified, Emergency Drugs available, Suction available, Timeout performed and Patient being monitored Patient Re-evaluated:Patient Re-evaluated prior to induction Oxygen Delivery Method: Nasal cannula Placement Confirmation: positive ETCO2

## 2020-12-08 NOTE — Transfer of Care (Signed)
Immediate Anesthesia Transfer of Care Note  Patient: Harold Tate  Procedure(s) Performed: COLONOSCOPY WITH PROPOFOL POLYPECTOMY ESOPHAGOGASTRODUODENOSCOPY (EGD) WITH PROPOFOL  Patient Location: PACU  Anesthesia Type: General  Level of Consciousness: awake, alert  and patient cooperative  Airway and Oxygen Therapy: Patient Spontanous Breathing and Patient connected to supplemental oxygen  Post-op Assessment: Post-op Vital signs reviewed, Patient's Cardiovascular Status Stable, Respiratory Function Stable, Patent Airway and No signs of Nausea or vomiting  Post-op Vital Signs: Reviewed and stable  Complications: No notable events documented.

## 2020-12-08 NOTE — H&P (Signed)
Harold Lame, MD [address] [place], [place] [address] Phone:[phone] Fax : [phone]  Primary Care Physician:  Harold Cousins, MD Primary Gastroenterologist:  Dr. Allen Tate  Pre-Procedure History & Physical: HPI:  Harold Tate is Tate 55 y.o. male is here for an endoscopy and colonoscopy.   Past Medical History:  Diagnosis Date   Anxiety    Arthritis    Diabetes mellitus without complication ([place])    Patient states A1C has gone down, no longer Tate diabetic   Dyspnea    Hip pain    Hypertension    Raynaud's syndrome    Sleep apnea     CPAP/ Doesn'Tate use    Past Surgical History:  Procedure Laterality Date   I & D EXTREMITY     OPEN REDUCTION INTERNAL FIXATION (ORIF) TIBIA/FIBULA FRACTURE Left July 2014   TOTAL HIP ARTHROPLASTY Right 11/03/2018   Procedure: RIGHT TOTAL HIP ARTHROPLASTY ANTERIOR APPROACH;  Surgeon: Harold Knows, MD;  Location: [facility] ORS;  Service: Orthopedics;  Laterality: Right;   TOTAL HIP ARTHROPLASTY Left 02/11/2019   Procedure: TOTAL HIP ARTHROPLASTY ANTERIOR APPROACH;  Surgeon: Harold Knows, MD;  Location: [facility] ORS;  Service: Orthopedics;  Laterality: Left;    Prior to Admission medications   Medication Sig Start Date End Date Taking? Authorizing Provider  clonazePAM (KLONOPIN) 0.5 MG tablet Take 1 tablet (0.5 mg total) by mouth 3 (three) times daily as needed for anxiety. 12/05/20  Yes McElwee, Lauren A, NP  vitamin B-12 (CYANOCOBALAMIN) 1000 MCG tablet Take 1 tablet (1,000 mcg total) by mouth daily. 04/26/20  Yes Cannady, Jolene T, NP  losartan (COZAAR) 25 MG tablet Take 25 mg by mouth daily. Patient not taking: No sig reported    [provider]  rosuvastatin (CRESTOR) 5 MG tablet Take 5 mg by mouth daily. Patient not taking: No sig reported    [provider]  sildenafil (VIAGRA) 100 MG tablet Take 0.5-1 tablets (50-100 mg total) by mouth daily as needed for erectile dysfunction. Patient not taking: Reported on  12/06/2020 07/11/20   Harold Guarneri T, NP    Allergies as of 11/02/2020   (No Known Allergies)    Family History  Problem Relation Age of Onset   Depression Sister     Social History   Socioeconomic History   Marital status: Married    Spouse name: Not on file   Number of children: Not on file   Years of education: Not on file   Highest education level: Not on file  Occupational History   Not on file  Tobacco Use   Smoking status: Every Day    Packs/day: 1.50    Years: 27.00    Pack years: 40.50    Types: Cigarettes   Smokeless tobacco: Never   Tobacco comments:    1.5PPD 11/16/2020  Vaping Use   Vaping Use: Never used  Substance and Sexual Activity   Alcohol use: Yes    Alcohol/week: 14.0 standard drinks    Types: 14 Cans of beer per week   Drug use: Not Currently    Types: Benzodiazepines   Sexual activity: Not on file  Other Topics Concern   Not on file  Social History Narrative   Not on file   Social Determinants of Health   Financial Resource Strain: Not on file  Food Insecurity: Not on file  Transportation Needs: Not on file  Physical Activity: Not on file  Stress: Not on file  Social Connections: Not on  file  Intimate Partner Violence: Not on file    Review of Systems: See HPI, otherwise negative ROS  Physical Exam: BP 113/73   Pulse 77   Temp 97.6 F (36.4 C) (Temporal)   Ht '6\' 2"'$  (1.88 m)   Wt 88.5 kg   SpO2 99%   BMI 25.04 kg/m  General:   Alert,  pleasant and cooperative in NAD Head:  Normocephalic and atraumatic. Neck:  Supple; no masses or thyromegaly. Lungs:  Clear throughout to auscultation.    Heart:  Regular rate and rhythm. Abdomen:  Soft, nontender and nondistended. Normal bowel sounds, without guarding, and without rebound.   Neurologic:  Alert and  oriented x4;  grossly normal neurologically.  Impression/Plan: [name] is here for an endoscopy and colonoscopy to be performed for screening and cirrhosis  Risks,  benefits, limitations, and alternatives regarding  endoscopy and colonoscopy have been reviewed with the patient.  Questions have been answered.  All parties agreeable.   Harold Lame, MD  12/08/2020, 9:36 AM

## 2020-12-11 ENCOUNTER — Encounter: Payer: Self-pay | Admitting: Gastroenterology

## 2020-12-11 LAB — SURGICAL PATHOLOGY

## 2020-12-15 ENCOUNTER — Ambulatory Visit: Payer: 59 | Admitting: Internal Medicine

## 2020-12-19 ENCOUNTER — Telehealth: Payer: Self-pay

## 2020-12-19 NOTE — Telephone Encounter (Signed)
-----   Message from Lucilla Lame, MD sent at 12/11/2020  1:34 PM EDT ----- I have filled out a letter for the patient to be sent in regards to his polyps but he also needs a office follow-up due to his portal hypertension and discuss further treatment of his cirrhosis.

## 2020-12-19 NOTE — Telephone Encounter (Signed)
Pt returned my call and scheduled a follow up appt to discuss cirrhosis and portal hypertension.

## 2020-12-19 NOTE — Telephone Encounter (Signed)
Left vm for pt to return call to schedule a follow up appt.

## 2020-12-26 ENCOUNTER — Telehealth: Payer: Self-pay | Admitting: Gastroenterology

## 2020-12-26 ENCOUNTER — Other Ambulatory Visit: Payer: Self-pay

## 2020-12-26 DIAGNOSIS — R1084 Generalized abdominal pain: Secondary | ICD-10-CM

## 2020-12-26 NOTE — Telephone Encounter (Signed)
Spoke with pt regarding Dr. Dorothey Baseman recommendation. Pt stated he thinks he may have figured out what's going on. He went for COVID testing today. Pt will go tomorrow, 12/27/20 if his test is negative.

## 2020-12-26 NOTE — Telephone Encounter (Signed)
Patient calling and having cramping around belly button. Hasn't had a solid stool since procedure. Running fever, 101.4.   Please advise

## 2020-12-27 ENCOUNTER — Telehealth: Payer: Self-pay

## 2020-12-27 ENCOUNTER — Emergency Department
Admission: EM | Admit: 2020-12-27 | Discharge: 2020-12-27 | Disposition: A | Discharge status: Home / Self Care | Payer: 59 | Attending: Emergency Medicine | Admitting: Emergency Medicine

## 2020-12-27 ENCOUNTER — Ambulatory Visit
Admission: RE | Admit: 2020-12-27 | Discharge: 2020-12-27 | Disposition: A | Discharge status: Home / Self Care | Payer: 59 | Source: Home / Self Care | Attending: Gastroenterology | Admitting: Gastroenterology

## 2020-12-27 ENCOUNTER — Other Ambulatory Visit: Payer: Self-pay

## 2020-12-27 ENCOUNTER — Emergency Department: Payer: 59

## 2020-12-27 ENCOUNTER — Ambulatory Visit
Admission: RE | Admit: 2020-12-27 | Discharge: 2020-12-27 | Disposition: A | Discharge status: Home / Self Care | Payer: 59 | Source: Ambulatory Visit | Attending: Gastroenterology | Admitting: Gastroenterology

## 2020-12-27 DIAGNOSIS — I10 Essential (primary) hypertension: Secondary | ICD-10-CM | POA: Diagnosis not present

## 2020-12-27 DIAGNOSIS — R1032 Left lower quadrant pain: Secondary | ICD-10-CM | POA: Diagnosis present

## 2020-12-27 DIAGNOSIS — Z79899 Other long term (current) drug therapy: Secondary | ICD-10-CM | POA: Diagnosis not present

## 2020-12-27 DIAGNOSIS — Z96643 Presence of artificial hip joint, bilateral: Secondary | ICD-10-CM | POA: Insufficient documentation

## 2020-12-27 DIAGNOSIS — E1165 Type 2 diabetes mellitus with hyperglycemia: Secondary | ICD-10-CM | POA: Diagnosis not present

## 2020-12-27 DIAGNOSIS — F17210 Nicotine dependence, cigarettes, uncomplicated: Secondary | ICD-10-CM | POA: Diagnosis not present

## 2020-12-27 DIAGNOSIS — Z8616 Personal history of COVID-19: Secondary | ICD-10-CM | POA: Insufficient documentation

## 2020-12-27 DIAGNOSIS — R1084 Generalized abdominal pain: Secondary | ICD-10-CM | POA: Insufficient documentation

## 2020-12-27 DIAGNOSIS — R739 Hyperglycemia, unspecified: Secondary | ICD-10-CM

## 2020-12-27 DIAGNOSIS — K529 Noninfective gastroenteritis and colitis, unspecified: Secondary | ICD-10-CM | POA: Insufficient documentation

## 2020-12-27 LAB — URINALYSIS, ROUTINE W REFLEX MICROSCOPIC
Bilirubin Urine: NEGATIVE
Glucose, UA: NEGATIVE mg/dL
Hgb urine dipstick: NEGATIVE
Ketones, ur: 5 mg/dL — AB
Leukocytes,Ua: NEGATIVE
Nitrite: NEGATIVE
Protein, ur: NEGATIVE mg/dL
Specific Gravity, Urine: 1.02 (ref 1.005–1.030)
pH: 5 (ref 5.0–8.0)

## 2020-12-27 LAB — COMPREHENSIVE METABOLIC PANEL
ALT: 16 U/L (ref 0–44)
AST: 24 U/L (ref 15–41)
Albumin: 3.7 g/dL (ref 3.5–5.0)
Alkaline Phosphatase: 87 U/L (ref 38–126)
Anion gap: 8 (ref 5–15)
BUN: 8 mg/dL (ref 6–20)
CO2: 24 mmol/L (ref 22–32)
Calcium: 8.8 mg/dL — ABNORMAL LOW (ref 8.9–10.3)
Chloride: 100 mmol/L (ref 98–111)
Creatinine, Ser: 0.82 mg/dL (ref 0.61–1.24)
GFR, Estimated: >60 mL/min (ref >60)
Glucose, Bld: 213 mg/dL — ABNORMAL HIGH (ref 70–99)
Potassium: 3.8 mmol/L (ref 3.5–5.1)
Sodium: 132 mmol/L — ABNORMAL LOW (ref 135–145)
Total Bilirubin: 0.9 mg/dL (ref 0.3–1.2)
Total Protein: 7.2 g/dL (ref 6.5–8.1)

## 2020-12-27 LAB — GASTROINTESTINAL PANEL BY PCR, STOOL (REPLACES STOOL CULTURE)

## 2020-12-27 LAB — CBC
HCT: 41 % (ref 39.0–52.0)
Hemoglobin: 15 g/dL (ref 13.0–17.0)
MCH: 34.4 pg — ABNORMAL HIGH (ref 26.0–34.0)
MCHC: 36.6 g/dL — ABNORMAL HIGH (ref 30.0–36.0)
MCV: 94 fL (ref 80.0–100.0)
Platelets: 145 10*3/uL — ABNORMAL LOW (ref 150–400)
RBC: 4.36 MIL/uL (ref 4.22–5.81)
RDW: 12.8 % (ref 11.5–15.5)
WBC: 8.2 10*3/uL (ref 4.0–10.5)
nRBC: 0 % (ref 0.0–0.2)

## 2020-12-27 LAB — PROTIME-INR
INR: 1 (ref 0.8–1.2)
Prothrombin Time: 13.4 seconds (ref 11.4–15.2)

## 2020-12-27 LAB — C DIFFICILE QUICK SCREEN W PCR REFLEX
C Diff antigen: NEGATIVE
C Diff interpretation: NOT DETECTED
C Diff toxin: NEGATIVE

## 2020-12-27 LAB — TYPE AND SCREEN
ABO/RH(D): O POS
Antibody Screen: NEGATIVE

## 2020-12-27 MED ORDER — IOHEXOL 350 MG/ML SOLN
100.0000 mL | Freq: Once | INTRAVENOUS | Status: AC | PRN
  Administered 2020-12-27: 100 mL via INTRAVENOUS

## 2020-12-27 MED ORDER — LACTATED RINGERS IV BOLUS
1000.0000 mL | Freq: Once | INTRAVENOUS | Status: AC
  Administered 2020-12-27: 1000 mL via INTRAVENOUS

## 2020-12-27 MED ORDER — DICYCLOMINE HCL 10 MG PO CAPS: 10.0000 mg | ORAL_CAPSULE | Freq: Four times a day (QID) | ORAL | 0 refills | Status: DC

## 2020-12-27 NOTE — ED Triage Notes (Signed)
Pt to ED for dark tarry stools and diarrhea for a few days. Reports colonoscopy a few weeks ago, polyps found and portal hypertension. Denies pain at this time

## 2020-12-27 NOTE — ED Provider Notes (Signed)
I assumed care of this patient approximately 1500.  Please eval for environment for full details regarding patient's initial evaluation assessment.  In brief he presents for diarrhea for the past approximate 2 weeks after recent colonoscopy.  He states he has been fairly dark other providers Hemoccult negative.  He has not had any bright red stools.  Reported 2 days ago with a fever although none in the last 24 hours.  He has not had any nausea or vomiting and describes his abdominal pain is crampy.  Denies any other clear associated sick symptoms.  No recent antibiotics.  CMP shows some mild hyperglycemia which I discussed with patient otherwise no significant electrolyte metabolic arrangement.  Advised he can follow this up with his PCP.  CBC shows no leukocytosis or acute anemia.  Urinalysis is unremarkable for evidence of cystitis.  C. difficile screen is negative.  GI pathogen panel sent.  On reassessment patient is hemodynamically stable feeling little better.  Advised him that he can follow-up with GI pathogen panel with his PCP tomorrow morning however given stable vitals with low suspicion for sepsis, ischemic colitis or other immediate life-threatening pathology I think is stable for discharge with close outpatient PCP and GI follow-up.  Will prescribe short course of Bentyl.  Discharged stable condition.  Strict return precautions advised and discussed.   Lucrezia Starch, MD 12/27/20 [phone]

## 2020-12-27 NOTE — ED Notes (Signed)
F/U CT   Harold Starch, MD 12/27/20 [phone]

## 2020-12-27 NOTE — ED Notes (Signed)
Pt comes to desk and states that he is in 8/10 pain. States that someone put in his mychart that he wasn't in pain but he is.

## 2020-12-27 NOTE — ED Notes (Signed)
Pt to CT

## 2020-12-27 NOTE — ED Notes (Addendum)
Pt to ED c/o liquid, coffee-colored stools since colonoscopy a few weeks ago. States ever since had this, has been having abdominal cramping with runny stools.  States started with fevers and chills about 6d ago with general malaise. About 3d ago, he felt very tired and had to lay down. He checked T around 0200 Monday morning and it was 101.4 (2d ago).  States has hardly eaten or had appetite for past 3d. Tried to eat soup yesterday, and afterwards he immediately had to have BM.  States today BMs are light brown, not dark brown, but still not well formed.  Pt also states urine stream is weak but empties bladder well. Denies pain, burning with urination.  States feels constant pain in lower mid abdomen, like gas pains. Fluctuates from 3/10 to 10/10. Started 4d ago.   Pt concerned because had polyp removed and staple placed during colonoscopy and is concerned there could be infx because had elevated T other day.

## 2020-12-27 NOTE — ED Notes (Signed)
Dr Tamala Julian was at bedside discussing discharge with pt.

## 2020-12-27 NOTE — Telephone Encounter (Signed)
Copied from [place] [phone]. Topic: General - Other >> Dec 27, 2020  9:36 AM Holley Dexter N wrote: Reason for CRM: Pt called in wanting to speak with PCP or nurse, about some pains he is having, pt is currently going in to get [place], but wanted to talk to someone about some advice. Please advise.

## 2020-12-28 ENCOUNTER — Telehealth (INDEPENDENT_AMBULATORY_CARE_PROVIDER_SITE_OTHER): Payer: 59 | Admitting: Nurse Practitioner

## 2020-12-28 ENCOUNTER — Encounter: Payer: Self-pay | Admitting: Internal Medicine

## 2020-12-28 ENCOUNTER — Telehealth: Payer: Self-pay | Admitting: Internal Medicine

## 2020-12-28 ENCOUNTER — Encounter: Payer: Self-pay | Admitting: Nurse Practitioner

## 2020-12-28 ENCOUNTER — Telehealth: Payer: Self-pay

## 2020-12-28 ENCOUNTER — Telehealth: Payer: Self-pay | Admitting: Gastroenterology

## 2020-12-28 DIAGNOSIS — I700 Atherosclerosis of aorta: Secondary | ICD-10-CM | POA: Diagnosis not present

## 2020-12-28 DIAGNOSIS — A09 Infectious gastroenteritis and colitis, unspecified: Secondary | ICD-10-CM

## 2020-12-28 MED ORDER — CIPROFLOXACIN HCL 750 MG PO TABS: 750.0000 mg | ORAL_TABLET | Freq: Two times a day (BID) | ORAL | 0 refills | Status: DC

## 2020-12-28 MED ORDER — AZITHROMYCIN 500 MG PO TABS: ORAL_TABLET | ORAL | 0 refills | Status: DC

## 2020-12-28 NOTE — Telephone Encounter (Signed)
Pt. Was seen in ED on yesterday.He would like a call back because he is confused about what he should be taking

## 2020-12-28 NOTE — Telephone Encounter (Signed)
Appt, virtual OK 

## 2020-12-28 NOTE — Telephone Encounter (Signed)
Copied from [place] [id number]. Topic: General - Other >> Dec 28, 2020  8:56 AM Valere Dross wrote: Reason for CRM: Pt called in stating he has just found out he has salmonella and wants to speak with a nurse asap. Please advise.

## 2020-12-28 NOTE — Telephone Encounter (Signed)
Pt called reporting that his sister had an allergic reaction the currently prescribed antibiotic. Pt is requesting to have azithromycin instead   Prefers:  [place], [place] - [place]  [address] [place] [address]  Phone: [phone] Fax: [phone]

## 2020-12-28 NOTE — Telephone Encounter (Signed)
Please advise 

## 2020-12-28 NOTE — Progress Notes (Signed)
Acute Office Visit  Subjective:    Patient ID: Harold Tate, male    DOB: [birth year], 55 y.o.   MRN: [id number]  Chief Complaint  Patient presents with   Salmonella    Pt states he was seen at the hospital yesterday and has salmonella     HPI Patient is in today for follow-up on diarrhea and positive salmonella test from the ER yesterday. He states that he has been having diarrhea for 2 weeks since his colonoscopy. He has noted intermittent abdominal pain and cramping. He also endorses a fever of 101.4 several days ago. He has been trying to drink fluids and has been able to eat. His diarrhea gets worse after eating. He denies shortness of breath and chest pain.   Past Medical History:  Diagnosis Date   Anxiety    Arthritis    Diabetes mellitus without complication ([place])    Patient states A1C has gone down, no longer a diabetic   Dyspnea    Hip pain    Hypertension    Raynaud's syndrome    Sleep apnea     CPAP/ Doesn't use    Past Surgical History:  Procedure Laterality Date   COLONOSCOPY WITH PROPOFOL N/A 12/08/2020   Procedure: COLONOSCOPY WITH PROPOFOL;  Surgeon: Lucilla Lame, MD;  Location: [facility];  Service: Endoscopy;  Laterality: N/A;   ESOPHAGOGASTRODUODENOSCOPY (EGD) WITH PROPOFOL N/A 12/08/2020   Procedure: ESOPHAGOGASTRODUODENOSCOPY (EGD) WITH PROPOFOL;  Surgeon: Lucilla Lame, MD;  Location: [facility];  Service: Endoscopy;  Laterality: N/A;   I & D EXTREMITY     OPEN REDUCTION INTERNAL FIXATION (ORIF) TIBIA/FIBULA FRACTURE Left July 2014   POLYPECTOMY N/A 12/08/2020   Procedure: POLYPECTOMY;  Surgeon: Lucilla Lame, MD;  Location: [facility];  Service: Endoscopy;  Laterality: N/A;   TOTAL HIP ARTHROPLASTY Right 11/03/2018   Procedure: RIGHT TOTAL HIP ARTHROPLASTY ANTERIOR APPROACH;  Surgeon: Hessie Knows, MD;  Location: [facility];  Service: Orthopedics;  Laterality: Right;   TOTAL HIP ARTHROPLASTY Left 02/11/2019   Procedure: TOTAL HIP  ARTHROPLASTY ANTERIOR APPROACH;  Surgeon: Hessie Knows, MD;  Location: [facility];  Service: Orthopedics;  Laterality: Left;    Family History  Problem Relation Age of Onset   Depression Sister     Social History   Socioeconomic History   Marital status: Married    Spouse name: Not on file   Number of children: Not on file   Years of education: Not on file   Highest education level: Not on file  Occupational History   Not on file  Tobacco Use   Smoking status: Every Day    Packs/day: 1.00    Years: 27.00    Pack years: 27.00    Types: Cigarettes   Smokeless tobacco: Never  Vaping Use   Vaping Use: Never used  Substance and Sexual Activity   Alcohol use: Not Currently   Drug use: Not Currently    Types: Benzodiazepines   Sexual activity: Not on file  Other Topics Concern   Not on file  Social History Narrative   Not on file   Social Determinants of Health   Financial Resource Strain: Not on file  Food Insecurity: Not on file  Transportation Needs: Not on file  Physical Activity: Not on file  Stress: Not on file  Social Connections: Not on file  Intimate Partner Violence: Not on file    Outpatient Medications Prior to Visit  Medication Sig Dispense Refill   clonazePAM (  KLONOPIN) 0.5 MG tablet Take 1 tablet (0.5 mg total) by mouth 3 (three) times daily as needed for anxiety. 90 tablet 2   dicyclomine (BENTYL) 10 MG capsule Take 1 capsule (10 mg total) by mouth 4 (four) times daily for 3 days. 12 capsule 0   vitamin B-12 (CYANOCOBALAMIN) 1000 MCG tablet Take 1 tablet (1,000 mcg total) by mouth daily. 90 tablet 4   losartan (COZAAR) 25 MG tablet Take 25 mg by mouth daily. (Patient not taking: No sig reported)     rosuvastatin (CRESTOR) 5 MG tablet Take 5 mg by mouth daily. (Patient not taking: No sig reported)     sildenafil (VIAGRA) 100 MG tablet Take 0.5-1 tablets (50-100 mg total) by mouth daily as needed for erectile dysfunction. (Patient not taking: No sig  reported) 5 tablet 11   No facility-administered medications prior to visit.    No Known Allergies  Review of Systems  Constitutional:  Positive for fatigue.  HENT: Negative.    Respiratory: Negative.    Cardiovascular: Negative.   Gastrointestinal:  Positive for abdominal pain and diarrhea. Negative for blood in stool.  Genitourinary: Negative.   Skin: Negative.   Neurological: Negative.       Objective:    Physical Exam Vitals and nursing note reviewed.  Pulmonary:     Comments: Able to talk in complete sentences Neurological:     Mental Status: He is alert and oriented to person, place, and time.    There were no vitals taken for this visit. Wt Readings from Last 3 Encounters:  12/27/20 198 lb (89.8 kg)  12/08/20 195 lb (88.5 kg)  12/05/20 201 lb (91.2 kg)    Health Maintenance Due  Topic Date Due   COVID-19 Vaccine (1) Never done   OPHTHALMOLOGY EXAM  Never done   Zoster Vaccines- Shingrix (1 of 2) Never done   INFLUENZA VACCINE  Never done    There are no preventive care reminders to display for this patient.   Lab Results  Component Value Date   TSH 2.670 09/22/2020   Lab Results  Component Value Date   WBC 8.2 12/27/2020   HGB 15.0 12/27/2020   HCT 41.0 12/27/2020   MCV 94.0 12/27/2020   PLT 145 (L) 12/27/2020   Lab Results  Component Value Date   NA 132 (L) 12/27/2020   K 3.8 12/27/2020   CO2 24 12/27/2020   GLUCOSE 213 (H) 12/27/2020   BUN 8 12/27/2020   CREATININE 0.82 12/27/2020   BILITOT 0.9 12/27/2020   ALKPHOS 87 12/27/2020   AST 24 12/27/2020   ALT 16 12/27/2020   PROT 7.2 12/27/2020   ALBUMIN 3.7 12/27/2020   CALCIUM 8.8 (L) 12/27/2020   ANIONGAP 8 12/27/2020   EGFR 103 12/05/2020   Lab Results  Component Value Date   CHOL 209 (H) 10/31/2020   Lab Results  Component Value Date   HDL 46 09/22/2020   Lab Results  Component Value Date   LDLCALC 128 (H) 09/22/2020   Lab Results  Component Value Date   TRIG 139  10/31/2020   Lab Results  Component Value Date   CHOLHDL 4.2 09/22/2020   Lab Results  Component Value Date   HGBA1C 5.3 12/05/2020       Assessment & Plan:   Problem List Items Addressed This Visit       Cardiovascular and Mediastinum   Aortic atherosclerosis ([place])    Noted on CT scan on 12/27/20. He was on crestor, however  this was stopped by the patient because he doesn't like to take medications. Will not restart this at this time due to ongoing liver cirrhosis and elevated LFTs. Consider starting zetia in the future.       Other Visit Diagnoses     Diarrhea of infectious origin    -  Primary   Stool positive for salmonella from ER. With ongoing symptoms, will treat with cipro BID x7days. Encouraged fluids, bland food. F/U if symptoms worsen        Meds ordered this encounter  Medications   ciprofloxacin (CIPRO) 750 MG tablet    Sig: Take 1 tablet (750 mg total) by mouth 2 (two) times daily.    Dispense:  14 tablet    Refill:  0    This visit was completed via telephone due to the restrictions of the COVID-19 pandemic. All issues as above were discussed and addressed but no physical exam was performed. If it was felt that the patient should be evaluated in the office, they were directed there. The patient verbally consented to this visit. Patient was unable to complete an audio/visual visit due to Technical difficulties", "Lack of internet. Due to the catastrophic nature of the COVID-19 pandemic, this visit was done through audio contact only. Location of the patient: home Location of the provider: work Those involved with this call:  Provider: Vance Peper, DNP CMA: Yvonna Alanis, [place] Desk/Registration:  Elizabeth Palau   Time spent on call:  15 minutes on the phone discussing health concerns. 10 minutes total spent in review of patient's record and preparation of their chart.   Charyl Dancer, NP

## 2020-12-28 NOTE — Assessment & Plan Note (Signed)
Noted on CT scan on 12/27/20. He was on crestor, however this was stopped by the patient because he doesn't like to take medications. Will not restart this at this time due to ongoing liver cirrhosis and elevated LFTs. Consider starting zetia in the future.

## 2020-12-28 NOTE — Telephone Encounter (Signed)
Spoke with patient, documented in separate telephone encounter.

## 2020-12-28 NOTE — Telephone Encounter (Signed)
Called and left a message for patient letting him know that the medication was changed.

## 2020-12-28 NOTE — Telephone Encounter (Signed)
Routing to provider to advise.  

## 2020-12-28 NOTE — Telephone Encounter (Signed)
Called and spoke with patient, informed him of verbal information from Ander Purpura, will let him know when you decide on what antibiotic that you decide to give him.   [employer]

## 2021-01-02 NOTE — Telephone Encounter (Signed)
Called and left a message for call back  

## 2021-01-02 NOTE — Telephone Encounter (Signed)
-----   Message from Lucilla Lame, MD sent at 12/31/2020 11:23 AM EDT ----- The patient noted the x-ray was completely normal and did not show any abnormalities to explain his symptoms.

## 2021-01-02 NOTE — Telephone Encounter (Signed)
Patient verbalized of results. He states he went to the ED and they did a stool sample and he had salmonella poison

## 2021-01-03 NOTE — ED Provider Notes (Signed)
[facility] Department Provider Note   ____________________________________________   Event Date/Time   First MD Initiated Contact with Patient 12/27/20 1413     (approximate)  I have reviewed the triage vital signs and the nursing notes.   HISTORY  Chief Complaint GI Bleeding    HPI Harold Tate is a 55 y.o. male who had a recent endoscopy with GI.  He reports since then he has noticed that he has had dark somewhat loose stool.  Said some crampy discomfort in his abdomen particularly in the left lower abdomen seems to be worsening over the last 24 hours.  He does also have a history of liver disease being followed for that.  Distant history of alcohol use  No chest pain no trouble breathing.  Felt like he did have a fever recently  Pain is mild to moderate located the left lower abdomen.  He drove himself here and does not wish for any pain medicine.  He has been working to stay hydrated and feels like he is staying well-hydrated home but continues to have persistent lower abdominal pain that is crampy in nature with loose stools for a couple weeks time. Past Medical History:  Diagnosis Date   Anxiety    Arthritis    Diabetes mellitus without complication ([place])    Patient states A1C has gone down, no longer a diabetic   Dyspnea    Hip pain    Hypertension    Raynaud's syndrome    Sleep apnea     CPAP/ Doesn't use    Patient Active Problem List   Diagnosis Date Noted   Aortic atherosclerosis ([place]) 12/28/2020   Esophageal varices in alcoholic cirrhosis ([place])    Screen for colon cancer    Polyp of ascending colon    Palpitation 12/06/2020   COVID-19 [id number]   Alcoholic cirrhosis of liver without ascites ([place]) 08/24/2020   Family history of polyps in the colon 08/24/2020   Portal venous hypertension ([place]) 08/24/2020   Thrombocytopenia ([place]) 05/31/2020   B12 deficiency 04/24/2020   Hereditary spastic paraparesis ([place])  04/22/2020   Right leg DVT ([place]) 09/03/2019   Long-term current use of benzodiazepine 06/10/2019   Status post total hip replacement, left 02/11/2019   Depression, major, single episode, moderate ([place]) 01/11/2019   Sexual dysfunction 11/19/2018   Status post total hip replacement, right 11/03/2018   Osteoarthritis of both hips 10/01/2018   Type 2 diabetes mellitus with obesity ([place]) 11/05/2016   Hyperlipidemia associated with type 2 diabetes mellitus ([place]) 06/06/2016   Raynaud's syndrome 09/30/2014   Generalized anxiety disorder 09/30/2014   Nicotine dependence, cigarettes, uncomplicated 0000000   Hypertension associated with diabetes ([place]) 09/30/2014    Past Surgical History:  Procedure Laterality Date   COLONOSCOPY WITH PROPOFOL N/A 12/08/2020   Procedure: COLONOSCOPY WITH PROPOFOL;  Surgeon: Lucilla Lame, MD;  Location: [facility];  Service: Endoscopy;  Laterality: N/A;   ESOPHAGOGASTRODUODENOSCOPY (EGD) WITH PROPOFOL N/A 12/08/2020   Procedure: ESOPHAGOGASTRODUODENOSCOPY (EGD) WITH PROPOFOL;  Surgeon: Lucilla Lame, MD;  Location: [facility];  Service: Endoscopy;  Laterality: N/A;   I & D EXTREMITY     OPEN REDUCTION INTERNAL FIXATION (ORIF) TIBIA/FIBULA FRACTURE Left July 2014   POLYPECTOMY N/A 12/08/2020   Procedure: POLYPECTOMY;  Surgeon: Lucilla Lame, MD;  Location: [facility];  Service: Endoscopy;  Laterality: N/A;   TOTAL HIP ARTHROPLASTY Right 11/03/2018   Procedure: RIGHT TOTAL HIP ARTHROPLASTY ANTERIOR APPROACH;  Surgeon: Hessie Knows, MD;  Location: [facility];  Service: Orthopedics;  Laterality: Right;   TOTAL HIP ARTHROPLASTY Left 02/11/2019   Procedure: TOTAL HIP ARTHROPLASTY ANTERIOR APPROACH;  Surgeon: Hessie Knows, MD;  Location: [facility];  Service: Orthopedics;  Laterality: Left;    Prior to Admission medications   Medication Sig Start Date End Date Taking? Authorizing Provider  dicyclomine (BENTYL) 10 MG capsule Take 1 capsule (10 mg  total) by mouth 4 (four) times daily for 3 days. 12/27/20 12/30/20 Yes Lucrezia Starch, MD  azithromycin (ZITHROMAX) 500 MG tablet Take 2 tablets on day 1 and then 1 tablet daily until gone 12/28/20   McElwee, Lauren A, NP  ciprofloxacin (CIPRO) 750 MG tablet Take 1 tablet (750 mg total) by mouth 2 (two) times daily. 12/28/20   McElwee, Scheryl Darter, NP  clonazePAM (KLONOPIN) 0.5 MG tablet Take 1 tablet (0.5 mg total) by mouth 3 (three) times daily as needed for anxiety. 12/05/20   McElwee, Lauren A, NP  losartan (COZAAR) 25 MG tablet Take 25 mg by mouth daily. Patient not taking: No sig reported    [provider]  rosuvastatin (CRESTOR) 5 MG tablet Take 5 mg by mouth daily. Patient not taking: No sig reported    [provider]  sildenafil (VIAGRA) 100 MG tablet Take 0.5-1 tablets (50-100 mg total) by mouth daily as needed for erectile dysfunction. Patient not taking: No sig reported 07/11/20   Marnee Guarneri T, NP  vitamin B-12 (CYANOCOBALAMIN) 1000 MCG tablet Take 1 tablet (1,000 mcg total) by mouth daily. 04/26/20   Venita Lick, NP    Allergies Patient has no known allergies.  Family History  Problem Relation Age of Onset   Depression Sister     Social History Social History   Tobacco Use   Smoking status: Every Day    Packs/day: 1.00    Years: 27.00    Pack years: 27.00    Types: Cigarettes   Smokeless tobacco: Never  Vaping Use   Vaping Use: Never used  Substance Use Topics   Alcohol use: Not Currently   Drug use: Not Currently    Types: Benzodiazepines    Review of Systems Constitutional: Possibly had a fever Eyes: No visual changes. ENT: No sore throat. Cardiovascular: Denies chest pain. Respiratory: Denies shortness of breath. Gastrointestinal: See HPI crampy associate with loose dark stool for a couple weeks Genitourinary: Negative for dysuria. Musculoskeletal: Negative for back pain. Skin: Negative for rash. Neurological: Negative for h  headaches.    ____________________________________________   PHYSICAL EXAM:  VITAL SIGNS: ED Triage Vitals  Enc Vitals Group     BP 12/27/20 1154 134/85     Pulse Rate 12/27/20 1154 85     Resp 12/27/20 1154 18     Temp 12/27/20 1154 98.5 F (36.9 C)     Temp Source 12/27/20 1154 Oral     SpO2 12/27/20 1154 97 %     Weight 12/27/20 1155 198 lb (89.8 kg)     Height 12/27/20 1155 '6\' 2"'$  (1.88 m)     Head Circumference --      Peak Flow --      Pain Score 12/27/20 1154 0     Pain Loc --      Pain Edu? --      Excl. in [place]? --     Constitutional: Alert and oriented. Well appearing and in no acute distress. Eyes: Conjunctivae are normal. Head: Atraumatic. Nose: No congestion/rhinnorhea. Mouth/Throat: Mucous membranes are moist. Neck: No  stridor.  Cardiovascular: Normal rate, regular rhythm. Grossly normal heart sounds.  Good peripheral circulation. Respiratory: Normal respiratory effort.  No retractions. Lungs CTAB. Gastrointestinal: Soft and moderate tenderness around the left lower quadrant without rebound or guarding.  No pain in McBurney's point.  No pain in the right upper quadrant or left upper quadrant Musculoskeletal: No lower extremity tenderness nor edema. Neurologic:  Normal speech and language. No gross focal neurologic deficits are appreciated.  Skin:  Skin is warm, dry and intact. No rash noted. Psychiatric: Mood and affect are normal. Speech and behavior are normal.  ____________________________________________   LABS (all labs ordered are listed, but only abnormal results are displayed)  Labs Reviewed  GASTROINTESTINAL PANEL BY PCR, STOOL (REPLACES STOOL CULTURE) - Abnormal; Notable for the following components:      Result Value   Salmonella species DETECTED (*)    All other components within normal limits  COMPREHENSIVE METABOLIC PANEL - Abnormal; Notable for the following components:   Sodium 132 (*)    Glucose, Bld 213 (*)    Calcium 8.8 (*)     All other components within normal limits  CBC - Abnormal; Notable for the following components:   MCH 34.4 (*)    MCHC 36.6 (*)    Platelets 145 (*)    All other components within normal limits  URINALYSIS, ROUTINE W REFLEX MICROSCOPIC - Abnormal; Notable for the following components:   Color, Urine YELLOW (*)    APPearance CLEAR (*)    Ketones, ur 5 (*)    All other components within normal limits  C DIFFICILE QUICK SCREEN W PCR REFLEX    PROTIME-INR  POC OCCULT BLOOD, ED  TYPE AND SCREEN   ____________________________________________  EKG   ____________________________________________  RADIOLOGY  CT scan ordered but results pending at time of signout ____________________________________________   PROCEDURES  Procedure(s) performed: None  Procedures  Critical Care performed: No  ____________________________________________   INITIAL IMPRESSION / ASSESSMENT AND PLAN / ED COURSE  Pertinent labs & imaging results that were available during my care of the patient were reviewed by me and considered in my medical decision making (see chart for details).   Differential diagnosis includes but is not limited to, abdominal perforation, aortic dissection, cholecystitis, appendicitis, diverticulitis, colitis, esophagitis/gastritis, kidney stone, pyelonephritis, urinary tract infection, aortic aneurysm. All are considered in decision and treatment plan. Based upon the patient's presentation and risk factors, as well as his recent colonoscopy we will proceed with imaging studies of the abdomen pelvis to further evaluate for etiology including evaluation for possible cholecystitis diverticulitis etc.  Also given the recent operative evaluation and stools of loose nature starting thereafter will obtain and send stool cultures if patient able to collect here in the ER for evaluation for possible pathogen  Overall the patient is well-appearing hemodynamically stable without evidence of  acute distress or instability denoted.  Ongoing care assigned to my partner Dr. Hulan Saas to follow-up on pending studies including stool culture if able to be sent here and collected tonight, as well as CT abdomen pelvis.     ____________________________________________   FINAL CLINICAL IMPRESSION(S) / ED DIAGNOSES  Final diagnoses:  Colitis  Hyperglycemia        Note:  This document was prepared using Dragon voice recognition software and may include unintentional dictation errors       Delman Kitten, MD 01/03/21 0012

## 2021-01-09 ENCOUNTER — Ambulatory Visit (INDEPENDENT_AMBULATORY_CARE_PROVIDER_SITE_OTHER): Payer: 59 | Admitting: Internal Medicine

## 2021-01-09 ENCOUNTER — Encounter: Payer: Self-pay | Admitting: Internal Medicine

## 2021-01-09 ENCOUNTER — Other Ambulatory Visit: Payer: Self-pay

## 2021-01-09 VITALS — BP 132/87 | HR 105 | Temp 98.2°F | Ht 74.02 in | Wt 193.6 lb

## 2021-01-09 DIAGNOSIS — E785 Hyperlipidemia, unspecified: Secondary | ICD-10-CM

## 2021-01-09 DIAGNOSIS — I152 Hypertension secondary to endocrine disorders: Secondary | ICD-10-CM | POA: Diagnosis not present

## 2021-01-09 DIAGNOSIS — E669 Obesity, unspecified: Secondary | ICD-10-CM

## 2021-01-09 DIAGNOSIS — Z1329 Encounter for screening for other suspected endocrine disorder: Secondary | ICD-10-CM | POA: Diagnosis not present

## 2021-01-09 DIAGNOSIS — E1159 Type 2 diabetes mellitus with other circulatory complications: Secondary | ICD-10-CM | POA: Diagnosis not present

## 2021-01-09 DIAGNOSIS — E1169 Type 2 diabetes mellitus with other specified complication: Secondary | ICD-10-CM

## 2021-01-09 MED ORDER — FEXOFENADINE HCL 180 MG PO TABS: 180.0000 mg | ORAL_TABLET | Freq: Every day | ORAL | 1 refills | Status: DC

## 2021-01-09 MED ORDER — FLUTICASONE PROPIONATE 50 MCG/ACT NA SUSP: 2.0000 | Freq: Every day | NASAL | 6 refills | Status: DC

## 2021-01-09 MED ORDER — MOMETASONE FUROATE 50 MCG/ACT NA SUSP: 2.0000 | Freq: Every day | NASAL | 12 refills | Status: DC

## 2021-01-09 NOTE — Progress Notes (Signed)
BP 132/87   Pulse (!) 105   Temp 98.2 F (36.8 C) (Oral)   Ht 6' 2.02" (1.88 m)   Wt 193 lb 9.6 oz (87.8 kg)   SpO2 98%   BMI 24.85 kg/m    Subjective:    Patient ID: Harold Tate, male    DOB: 1966/02/20, 55 y.o.   MRN: [id number]  Chief Complaint  Patient presents with   Sinusitis   Abdominal Pain   salmonella positive    Was given Abx "Z-Pak" by Ander Purpura on 9/1 and wants to know if his stomach pain is because the infection did not clea    HPI: Harold Tate is a 55 y.o. male  Pt is here for a fu., he has had EGD / Cscope  EGD : shows -Grade I varices were found in the lower third of the esophagus. Findings: Portal hypertensive gastropathy was found in the entire examined stomach To fu with gi for such  Was found to have salmonella in stools sample.    Sinusitis This is a recurrent (has had such every year. feels crummy in the am and then better) problem. The current episode started in the past 7 days. There has been no fever. The fever has been present for Less than 1 day. The pain is mild. Associated symptoms include congestion and headaches. Pertinent negatives include no chills, coughing, diaphoresis, ear pain, hoarse voice, neck pain, shortness of breath, sinus pressure, sneezing, sore throat or swollen glands.  Abdominal Pain This is a chronic (has had some Diarrhea loose stool yesterday x 2 times then. 1st time was a week ago) problem. Associated symptoms include headaches.   Chief Complaint  Patient presents with   Sinusitis   Abdominal Pain   salmonella positive    Was given Abx "Z-Pak" by Ander Purpura on 9/1 and wants to know if his stomach pain is because the infection did not clea    Relevant past medical, surgical, family and social history reviewed and updated as indicated. Interim medical history since our last visit reviewed. Allergies and medications reviewed and updated.  Review of Systems  Constitutional:  Negative for chills and diaphoresis.   HENT:  Positive for congestion. Negative for ear pain, hoarse voice, sinus pressure, sneezing and sore throat.   Respiratory:  Negative for cough and shortness of breath.   Gastrointestinal:  Positive for abdominal pain.  Musculoskeletal:  Negative for neck pain.  Neurological:  Positive for headaches.   Per HPI unless specifically indicated above     Objective:    BP 132/87   Pulse (!) 105   Temp 98.2 F (36.8 C) (Oral)   Ht 6' 2.02" (1.88 m)   Wt 193 lb 9.6 oz (87.8 kg)   SpO2 98%   BMI 24.85 kg/m   Wt Readings from Last 3 Encounters:  01/09/21 193 lb 9.6 oz (87.8 kg)  12/27/20 198 lb (89.8 kg)  12/08/20 195 lb (88.5 kg)    Physical Exam Vitals and nursing note reviewed.  Constitutional:      General: He is not in acute distress.    Appearance: Normal appearance. He is not ill-appearing or diaphoretic.  HENT:     Head: Normocephalic and atraumatic.     Right Ear: Tympanic membrane and external ear normal. There is no impacted cerumen.     Left Ear: External ear normal.     Nose: No congestion or rhinorrhea.     Mouth/Throat:     Pharynx: No oropharyngeal  exudate or posterior oropharyngeal erythema.  Eyes:     Conjunctiva/sclera: Conjunctivae normal.     Pupils: Pupils are equal, round, and reactive to light.  Cardiovascular:     Rate and Rhythm: Normal rate and regular rhythm.     Heart sounds: No murmur heard.   No friction rub. No gallop.  Pulmonary:     Effort: No respiratory distress.     Breath sounds: No stridor. No wheezing or rhonchi.  Chest:     Chest wall: No tenderness.  Abdominal:     General: Abdomen is flat. Bowel sounds are normal.     Palpations: Abdomen is soft. There is no mass.     Tenderness: There is no abdominal tenderness.  Musculoskeletal:     Cervical back: Normal range of motion and neck supple. No rigidity or tenderness.     Left lower leg: No edema.  Skin:    General: Skin is warm and dry.  Neurological:     Mental Status: He  is alert.    Results for orders placed or performed during the hospital encounter of 12/27/20  C Difficile Quick Screen w PCR reflex   Specimen: Stool  Result Value Ref Range   C Diff antigen NEGATIVE NEGATIVE   C Diff toxin NEGATIVE NEGATIVE   C Diff interpretation No C. difficile detected.   Gastrointestinal Panel by PCR , Stool  Result Value Ref Range   Campylobacter species NOT DETECTED NOT DETECTED   Plesimonas shigelloides NOT DETECTED NOT DETECTED   Salmonella species DETECTED (A) NOT DETECTED   Yersinia enterocolitica NOT DETECTED NOT DETECTED   Vibrio species NOT DETECTED NOT DETECTED   Vibrio cholerae NOT DETECTED NOT DETECTED   Enteroaggregative E coli (EAEC) NOT DETECTED NOT DETECTED   Enteropathogenic E coli (EPEC) NOT DETECTED NOT DETECTED   Enterotoxigenic E coli (ETEC) NOT DETECTED NOT DETECTED   Shiga like toxin producing E coli (STEC) NOT DETECTED NOT DETECTED   Shigella/Enteroinvasive E coli (EIEC) NOT DETECTED NOT DETECTED   Cryptosporidium NOT DETECTED NOT DETECTED   Cyclospora cayetanensis NOT DETECTED NOT DETECTED   Entamoeba histolytica NOT DETECTED NOT DETECTED   Giardia lamblia NOT DETECTED NOT DETECTED   Adenovirus F40/41 NOT DETECTED NOT DETECTED   Astrovirus NOT DETECTED NOT DETECTED   Norovirus GI/GII NOT DETECTED NOT DETECTED   Rotavirus A NOT DETECTED NOT DETECTED   Sapovirus (I, II, IV, and V) NOT DETECTED NOT DETECTED  Comprehensive metabolic panel  Result Value Ref Range   Sodium 132 (L) 135 - 145 mmol/L   Potassium 3.8 3.5 - 5.1 mmol/L   Chloride 100 98 - 111 mmol/L   CO2 24 22 - 32 mmol/L   Glucose, Bld 213 (H) 70 - 99 mg/dL   BUN 8 6 - 20 mg/dL   Creatinine, Ser 0.82 0.61 - 1.24 mg/dL   Calcium 8.8 (L) 8.9 - 10.3 mg/dL   Total Protein 7.2 6.5 - 8.1 g/dL   Albumin 3.7 3.5 - 5.0 g/dL   AST 24 15 - 41 U/L   ALT 16 0 - 44 U/L   Alkaline Phosphatase 87 38 - 126 U/L   Total Bilirubin 0.9 0.3 - 1.2 mg/dL   GFR, Estimated >60 >60 mL/min    Anion gap 8 5 - 15  CBC  Result Value Ref Range   WBC 8.2 4.0 - 10.5 K/uL   RBC 4.36 4.22 - 5.81 MIL/uL   Hemoglobin 15.0 13.0 - 17.0 g/dL   HCT 41.0 39.0 -  52.0 %   MCV 94.0 80.0 - 100.0 fL   MCH 34.4 (H) 26.0 - 34.0 pg   MCHC 36.6 (H) 30.0 - 36.0 g/dL   RDW 12.8 11.5 - 15.5 %   Platelets 145 (L) 150 - 400 K/uL   nRBC 0.0 0.0 - 0.2 %  Urinalysis, Routine w reflex microscopic Urine, Clean Catch  Result Value Ref Range   Color, Urine YELLOW (A) YELLOW   APPearance CLEAR (A) CLEAR   Specific Gravity, Urine 1.020 1.005 - 1.030   pH 5.0 5.0 - 8.0   Glucose, UA NEGATIVE NEGATIVE mg/dL   Hgb urine dipstick NEGATIVE NEGATIVE   Bilirubin Urine NEGATIVE NEGATIVE   Ketones, ur 5 (A) NEGATIVE mg/dL   Protein, ur NEGATIVE NEGATIVE mg/dL   Nitrite NEGATIVE NEGATIVE   Leukocytes,Ua NEGATIVE NEGATIVE  Protime-INR  Result Value Ref Range   Prothrombin Time 13.4 11.4 - 15.2 seconds   INR 1.0 0.8 - 1.2  Type and screen [place]  Result Value Ref Range   ABO/RH(D) O POS    Antibody Screen NEG    Sample Expiration      12/30/2020,2359 Performed at [facility], [address] [place], [place] [address]         Current Outpatient Medications:    azithromycin (ZITHROMAX) 500 MG tablet, Take 2 tablets on day 1 and then 1 tablet daily until gone, Disp: 8 tablet, Rfl: 0   ciprofloxacin (CIPRO) 750 MG tablet, Take 1 tablet (750 mg total) by mouth 2 (two) times daily., Disp: 14 tablet, Rfl: 0   clonazePAM (KLONOPIN) 0.5 MG tablet, Take 1 tablet (0.5 mg total) by mouth 3 (three) times daily as needed for anxiety., Disp: 90 tablet, Rfl: 2   losartan (COZAAR) 25 MG tablet, Take 25 mg by mouth daily., Disp: , Rfl:    rosuvastatin (CRESTOR) 5 MG tablet, Take 5 mg by mouth daily., Disp: , Rfl:    sildenafil (VIAGRA) 100 MG tablet, Take 0.5-1 tablets (50-100 mg total) by mouth daily as needed for erectile dysfunction., Disp: 5 tablet, Rfl: 11   vitamin B-12  (CYANOCOBALAMIN) 1000 MCG tablet, Take 1 tablet (1,000 mcg total) by mouth daily., Disp: 90 tablet, Rfl: 4   dicyclomine (BENTYL) 10 MG capsule, Take 1 capsule (10 mg total) by mouth 4 (four) times daily for 3 days., Disp: 12 capsule, Rfl: 0     Portal hypertensive gastropathy : / Varices :  To fu with gi for such    CT abdomen in the ER :  1. Likely mild pancolitis. Differential diagnosis for etiology includes infection, inflammation, ischemia. 2. Scattered colonic diverticulosis with no acute diverticulitis. 3. Hepatosplenomegaly. 4. Nonspecific slightly more conspicuous and increased haziness along the retroperitoneum. Associated slightly more prominent but nonenlarged lymph nodes. Recommend attention on follow-up. 5. Limited evaluation of the pelvis due to streak artifact  originating from bilateral femoral surgical hardware. 6.  Aortic Atherosclerosis (ICD10-I70.0).  CT chest / abdomen :  Two 4 mm nodules in the lungs as above. No follow-up needed if patient is low-risk (and has no known or suspected primary neoplasm). Non-contrast chest CT can be considered in 12 months if patient is high-risk. This recommendation follows the consensus statement: Guidelines for Management of Incidental Pulmonary Nodules Detected on CT Images: From the Fleischner Society 2017; Radiology 2017; 284:228-243.   Emphysema.   Coronary artery disease.   Diffuse fatty infiltration of the liver with nodular contour suggesting cirrhosis.   Sigmoid diverticulosis.  2. Alllergic rhinitis will start pt on allegra/ flonase.  Consider abx if doesn't get better.  pt advised to take Tylenol q 4- 6 hourly as needed. pt to take allegra q pm as needed and to call office if symptoms worsened pt verbalised understanding of such.    3. HTN HTN :  Continue current meds.  Medication compliance emphasised. pt advised to keep Bp logs. Pt verbalised understanding of the same. Pt to have a low salt diet . Exercise  to reach a goal of at least 150 mins a week.  lifestyle modifications explained and pt understands importance of the above.  4. HLD  recheck FLP, check LFT's work on diet, SE of meds explained to pt. low fat and high fiber diet explained to pt.       Problem List Items Addressed This Visit   None          Follow up plan: No follow-ups on file.

## 2021-01-18 ENCOUNTER — Other Ambulatory Visit: Payer: 59

## 2021-01-18 ENCOUNTER — Other Ambulatory Visit: Payer: Self-pay

## 2021-01-18 DIAGNOSIS — E669 Obesity, unspecified: Secondary | ICD-10-CM

## 2021-01-18 DIAGNOSIS — E1169 Type 2 diabetes mellitus with other specified complication: Secondary | ICD-10-CM

## 2021-01-18 DIAGNOSIS — Z1329 Encounter for screening for other suspected endocrine disorder: Secondary | ICD-10-CM

## 2021-01-18 DIAGNOSIS — I152 Hypertension secondary to endocrine disorders: Secondary | ICD-10-CM

## 2021-01-18 DIAGNOSIS — E1159 Type 2 diabetes mellitus with other circulatory complications: Secondary | ICD-10-CM

## 2021-01-18 LAB — BAYER DCA HB A1C WAIVED: HB A1C (BAYER DCA - WAIVED): 5.6 % (ref 4.8–5.6)

## 2021-01-19 LAB — CBC WITH DIFFERENTIAL/PLATELET
Basophils Absolute: 0.1 10*3/uL (ref 0.0–0.2)
Basos: 2 %
EOS (ABSOLUTE): 0.4 10*3/uL (ref 0.0–0.4)
Eos: 7 %
Hematocrit: 44.7 % (ref 37.5–51.0)
Hemoglobin: 15.3 g/dL (ref 13.0–17.7)
Immature Grans (Abs): 0 10*3/uL (ref 0.0–0.1)
Immature Granulocytes: 0 %
Lymphocytes Absolute: 2 10*3/uL (ref 0.7–3.1)
Lymphs: 34 %
MCH: 32.9 pg (ref 26.6–33.0)
MCHC: 34.2 g/dL (ref 31.5–35.7)
MCV: 96 fL (ref 79–97)
Monocytes Absolute: 0.7 10*3/uL (ref 0.1–0.9)
Monocytes: 11 %
Neutrophils Absolute: 2.7 10*3/uL (ref 1.4–7.0)
Neutrophils: 46 %
Platelets: 140 10*3/uL — ABNORMAL LOW (ref 150–450)
RBC: 4.65 x10E6/uL (ref 4.14–5.80)
RDW: 12.6 % (ref 11.6–15.4)
WBC: 5.9 10*3/uL (ref 3.4–10.8)

## 2021-01-19 LAB — THYROID PANEL WITH TSH
Free Thyroxine Index: 2.2 (ref 1.2–4.9)
T3 Uptake Ratio: 25 % (ref 24–39)
T4, Total: 8.7 ug/dL (ref 4.5–12.0)
TSH: 2.57 u[IU]/mL (ref 0.450–4.500)

## 2021-01-19 LAB — COMPREHENSIVE METABOLIC PANEL
ALT: 19 IU/L (ref 0–44)
AST: 19 IU/L (ref 0–40)
Albumin/Globulin Ratio: 1.8 (ref 1.2–2.2)
Albumin: 4.4 g/dL (ref 3.8–4.9)
Alkaline Phosphatase: 109 IU/L (ref 44–121)
BUN/Creatinine Ratio: 16 (ref 9–20)
BUN: 12 mg/dL (ref 6–24)
Bilirubin Total: 0.4 mg/dL (ref 0.0–1.2)
CO2: 23 mmol/L (ref 20–29)
Calcium: 9.3 mg/dL (ref 8.7–10.2)
Chloride: 101 mmol/L (ref 96–106)
Creatinine, Ser: 0.75 mg/dL — ABNORMAL LOW (ref 0.76–1.27)
Globulin, Total: 2.4 g/dL (ref 1.5–4.5)
Glucose: 103 mg/dL — ABNORMAL HIGH (ref 65–99)
Potassium: 4.5 mmol/L (ref 3.5–5.2)
Sodium: 138 mmol/L (ref 134–144)
Total Protein: 6.8 g/dL (ref 6.0–8.5)
eGFR: 107 mL/min/{1.73_m2} (ref >59)

## 2021-01-19 LAB — LIPID PANEL
Chol/HDL Ratio: 3.5 ratio (ref 0.0–5.0)
Cholesterol, Total: 174 mg/dL (ref 100–199)
HDL: 50 mg/dL (ref >39)
LDL Chol Calc (NIH): 106 mg/dL — ABNORMAL HIGH (ref 0–99)
Triglycerides: 99 mg/dL (ref 0–149)
VLDL Cholesterol Cal: 18 mg/dL (ref 5–40)

## 2021-01-26 ENCOUNTER — Ambulatory Visit: Payer: 59 | Admitting: Internal Medicine

## 2021-01-29 ENCOUNTER — Encounter: Payer: Self-pay | Admitting: Internal Medicine

## 2021-01-29 ENCOUNTER — Telehealth (INDEPENDENT_AMBULATORY_CARE_PROVIDER_SITE_OTHER): Payer: 59 | Admitting: Internal Medicine

## 2021-01-29 DIAGNOSIS — R7303 Prediabetes: Secondary | ICD-10-CM | POA: Diagnosis not present

## 2021-01-29 DIAGNOSIS — I10 Essential (primary) hypertension: Secondary | ICD-10-CM | POA: Diagnosis not present

## 2021-01-29 DIAGNOSIS — K766 Portal hypertension: Secondary | ICD-10-CM | POA: Diagnosis not present

## 2021-01-29 DIAGNOSIS — E785 Hyperlipidemia, unspecified: Secondary | ICD-10-CM | POA: Diagnosis not present

## 2021-01-29 NOTE — Progress Notes (Signed)
There were no vitals taken for this visit.   Subjective:    Patient ID: Harold Tate, male    DOB: 1965-05-07, 55 y.o.   MRN: [id number]  Chief Complaint  Patient presents with   Hypertension    HPI: Harold Tate is a 55 y.o. male Patient is here for a follow-up on his chronic medical problems via virtual visit as he has a history of portal hypertension newly diagnosed cirrhosis of his liver  This visit was completed via telephone due to the restrictions of the COVID-19 pandemic. All issues as above were discussed and addressed but no physical exam was performed. If it was felt that the patient should be evaluated in the office, they were directed there. The patient verbally consented to this visit. Patient was unable to complete an audio/visual visit due to Technical difficulties. Due to the catastrophic nature of the COVID-19 pandemic, this visit was done through audio contact only. Location of the patient: home Location of the provider: work Those involved with this call:  Provider: Charlynne Cousins, MD CMA: Frazier Butt, [place] Desk/Registration: Myrlene Broker  Time spent on call: 10 minutes on the phone discussing health concerns. 10 minutes total spent in review of patient's record and preparation of their chart.   Pt has a new diagnosis of cirrhosis seen Dr. Allen Norris for such , deneis weight changes, no N/V abdominal pain   Hypertension This is a chronic problem. The current episode started more than 1 year ago. The problem is controlled. Pertinent negatives include no anxiety, blurred vision, chest pain, malaise/fatigue, neck pain, orthopnea, peripheral edema, PND or shortness of breath.  Hyperlipidemia This is a chronic problem. The problem is controlled. Recent lipid tests were reviewed and are normal. Pertinent negatives include no chest pain or shortness of breath.   Chief Complaint  Patient presents with   Hypertension    Relevant past medical, surgical, family and  social history reviewed and updated as indicated. Interim medical history since our last visit reviewed. Allergies and medications reviewed and updated.  Review of Systems  Constitutional:  Negative for malaise/fatigue.  Eyes:  Negative for blurred vision.  Respiratory:  Negative for shortness of breath.   Cardiovascular:  Negative for chest pain, orthopnea and PND.  Musculoskeletal:  Negative for neck pain.   Per HPI unless specifically indicated above     Objective:    There were no vitals taken for this visit.  Wt Readings from Last 3 Encounters:  01/09/21 193 lb 9.6 oz (87.8 kg)  12/27/20 198 lb (89.8 kg)  12/08/20 195 lb (88.5 kg)    Physical Exam Vitals and nursing note reviewed.  Constitutional:      General: He is not in acute distress.    Appearance: Normal appearance. He is not ill-appearing or diaphoretic.  HENT:     Head: Normocephalic and atraumatic.     Right Ear: Tympanic membrane and external ear normal. There is no impacted cerumen.     Left Ear: External ear normal.     Nose: No congestion or rhinorrhea.     Mouth/Throat:     Pharynx: No oropharyngeal exudate or posterior oropharyngeal erythema.  Eyes:     Conjunctiva/sclera: Conjunctivae normal.     Pupils: Pupils are equal, round, and reactive to light.  Cardiovascular:     Rate and Rhythm: Normal rate and regular rhythm.     Heart sounds: No murmur heard.   No friction rub. No gallop.  Pulmonary:  Effort: No respiratory distress.     Breath sounds: No stridor. No wheezing or rhonchi.  Chest:     Chest wall: No tenderness.  Abdominal:     General: Abdomen is flat. Bowel sounds are normal.     Palpations: Abdomen is soft. There is no mass.     Tenderness: There is no abdominal tenderness.  Musculoskeletal:     Cervical back: Normal range of motion and neck supple. No rigidity or tenderness.     Left lower leg: No edema.  Skin:    General: Skin is warm and dry.  Neurological:     Mental  Status: He is alert.    Results for orders placed or performed in visit on 01/18/21  Lipid panel  Result Value Ref Range   Cholesterol, Total 174 100 - 199 mg/dL   Triglycerides 99 0 - 149 mg/dL   HDL 50 >39 mg/dL   VLDL Cholesterol Cal 18 5 - 40 mg/dL   LDL Chol Calc (NIH) 106 (H) 0 - 99 mg/dL   Chol/HDL Ratio 3.5 0.0 - 5.0 ratio  Bayer DCA Hb A1c Waived  Result Value Ref Range   HB A1C (BAYER DCA - WAIVED) 5.6 4.8 - 5.6 %  Thyroid Panel With TSH  Result Value Ref Range   TSH 2.570 0.450 - 4.500 uIU/mL   T4, Total 8.7 4.5 - 12.0 ug/dL   T3 Uptake Ratio 25 24 - 39 %   Free Thyroxine Index 2.2 1.2 - 4.9  CBC with Differential/Platelet  Result Value Ref Range   WBC 5.9 3.4 - 10.8 x10E3/uL   RBC 4.65 4.14 - 5.80 x10E6/uL   Hemoglobin 15.3 13.0 - 17.7 g/dL   Hematocrit 44.7 37.5 - 51.0 %   MCV 96 79 - 97 fL   MCH 32.9 26.6 - 33.0 pg   MCHC 34.2 31.5 - 35.7 g/dL   RDW 12.6 11.6 - 15.4 %   Platelets 140 (L) 150 - 450 x10E3/uL   Neutrophils 46 Not Estab. %   Lymphs 34 Not Estab. %   Monocytes 11 Not Estab. %   Eos 7 Not Estab. %   Basos 2 Not Estab. %   Neutrophils Absolute 2.7 1.4 - 7.0 x10E3/uL   Lymphocytes Absolute 2.0 0.7 - 3.1 x10E3/uL   Monocytes Absolute 0.7 0.1 - 0.9 x10E3/uL   EOS (ABSOLUTE) 0.4 0.0 - 0.4 x10E3/uL   Basophils Absolute 0.1 0.0 - 0.2 x10E3/uL   Immature Granulocytes 0 Not Estab. %   Immature Grans (Abs) 0.0 0.0 - 0.1 x10E3/uL  Comprehensive metabolic panel  Result Value Ref Range   Glucose 103 (H) 65 - 99 mg/dL   BUN 12 6 - 24 mg/dL   Creatinine, Ser 0.75 (L) 0.76 - 1.27 mg/dL   eGFR 107 >59 mL/min/1.73   BUN/Creatinine Ratio 16 9 - 20   Sodium 138 134 - 144 mmol/L   Potassium 4.5 3.5 - 5.2 mmol/L   Chloride 101 96 - 106 mmol/L   CO2 23 20 - 29 mmol/L   Calcium 9.3 8.7 - 10.2 mg/dL   Total Protein 6.8 6.0 - 8.5 g/dL   Albumin 4.4 3.8 - 4.9 g/dL   Globulin, Total 2.4 1.5 - 4.5 g/dL   Albumin/Globulin Ratio 1.8 1.2 - 2.2   Bilirubin Total  0.4 0.0 - 1.2 mg/dL   Alkaline Phosphatase 109 44 - 121 IU/L   AST 19 0 - 40 IU/L   ALT 19 0 - 44 IU/L  Current Outpatient Medications:    azithromycin (ZITHROMAX) 500 MG tablet, Take 2 tablets on day 1 and then 1 tablet daily until gone, Disp: 8 tablet, Rfl: 0   ciprofloxacin (CIPRO) 750 MG tablet, Take 1 tablet (750 mg total) by mouth 2 (two) times daily., Disp: 14 tablet, Rfl: 0   clonazePAM (KLONOPIN) 0.5 MG tablet, Take 1 tablet (0.5 mg total) by mouth 3 (three) times daily as needed for anxiety., Disp: 90 tablet, Rfl: 2   dicyclomine (BENTYL) 10 MG capsule, Take 1 capsule (10 mg total) by mouth 4 (four) times daily for 3 days., Disp: 12 capsule, Rfl: 0   fexofenadine (ALLEGRA ALLERGY) 180 MG tablet, Take 1 tablet (180 mg total) by mouth daily., Disp: 10 tablet, Rfl: 1   fluticasone (FLONASE) 50 MCG/ACT nasal spray, Place 2 sprays into both nostrils daily., Disp: 16 g, Rfl: 6   losartan (COZAAR) 25 MG tablet, Take 25 mg by mouth daily., Disp: , Rfl:    rosuvastatin (CRESTOR) 5 MG tablet, Take 5 mg by mouth daily., Disp: , Rfl:    sildenafil (VIAGRA) 100 MG tablet, Take 0.5-1 tablets (50-100 mg total) by mouth daily as needed for erectile dysfunction., Disp: 5 tablet, Rfl: 11   vitamin B-12 (CYANOCOBALAMIN) 1000 MCG tablet, Take 1 tablet (1,000 mcg total) by mouth daily., Disp: 90 tablet, Rfl: 4    Assessment & Plan:  Cirrhosis / Portal Htn  Seen by Dr. Allen Norris for such stable, chronic. Cscope done recently. nodular liver and cirrhosis.  2. HTN is on cozaar 61m daily. Continue current meds.  Medication compliance emphasised. pt advised to keep Bp logs. Pt verbalised understanding of the same. Pt to have a low salt diet . Exercise to reach a goal of at least 150 mins a week.  lifestyle modifications explained and pt understands importance of the above.   3. HLD Is on crestor for such  recheck FLP, check LFT's work on diet, SE of meds explained to pt. low fat and high fiber diet  explained to pt.  4. Allegric rhinitis  Is on flonase/ allegra for such   Problem List Items Addressed This Visit       Cardiovascular and Mediastinum   Portal venous hypertension ([facility] - Primary   Primary hypertension     Other   Prediabetes   Hyperlipidemia     No orders of the defined types were placed in this encounter.    No orders of the defined types were placed in this encounter.    Follow up plan: Return in about 3 months (around 05/01/2021).

## 2021-02-01 DIAGNOSIS — E785 Hyperlipidemia, unspecified: Secondary | ICD-10-CM | POA: Insufficient documentation

## 2021-02-01 DIAGNOSIS — R7303 Prediabetes: Secondary | ICD-10-CM | POA: Insufficient documentation

## 2021-02-01 DIAGNOSIS — I10 Essential (primary) hypertension: Secondary | ICD-10-CM | POA: Insufficient documentation

## 2021-02-15 ENCOUNTER — Ambulatory Visit (INDEPENDENT_AMBULATORY_CARE_PROVIDER_SITE_OTHER): Payer: 59 | Admitting: Gastroenterology

## 2021-02-15 ENCOUNTER — Other Ambulatory Visit: Payer: Self-pay

## 2021-02-15 ENCOUNTER — Encounter: Payer: Self-pay | Admitting: Gastroenterology

## 2021-02-15 VITALS — BP 106/68 | HR 94 | Ht 74.0 in | Wt 198.0 lb

## 2021-02-15 DIAGNOSIS — K7030 Alcoholic cirrhosis of liver without ascites: Secondary | ICD-10-CM | POA: Diagnosis not present

## 2021-02-15 NOTE — Progress Notes (Signed)
Primary Care Physician: Charlynne Cousins, MD  Primary Gastroenterologist:  Dr. Lucilla Lame  Chief Complaint  Patient presents with   Follow up Cirrhosis    HPI: Harold Tate is a 55 y.o. male here for follow-up after having EGD and colonoscopy.  The patient has been found to have grade 1 esophageal varices with hypertensive portal gastropathy and a rectal varices.  He has continued to abstain from all alcohol use.  He also reports that he feels tired and weak and wants to know what he can do for that.  There is no report of any black stools or bloody stools.  He also denies any fevers or chills.  Past Medical History:  Diagnosis Date   Anxiety    Arthritis    Diabetes mellitus without complication ([place])    Patient states A1C has gone down, no longer a diabetic   Dyspnea    Hip pain    Hypertension    Raynaud's syndrome    Sleep apnea     CPAP/ Doesn't use    Current Outpatient Medications  Medication Sig Dispense Refill   clonazePAM (KLONOPIN) 0.5 MG tablet Take 1 tablet (0.5 mg total) by mouth 3 (three) times daily as needed for anxiety. 90 tablet 2   rosuvastatin (CRESTOR) 5 MG tablet Take 5 mg by mouth daily.     sildenafil (VIAGRA) 100 MG tablet Take 0.5-1 tablets (50-100 mg total) by mouth daily as needed for erectile dysfunction. 5 tablet 11   No current facility-administered medications for this visit.    Allergies as of 02/15/2021   (No Known Allergies)    ROS:  General: Negative for anorexia, weight loss, fever, chills, fatigue, weakness. ENT: Negative for hoarseness, difficulty swallowing , nasal congestion. CV: Negative for chest pain, angina, palpitations, dyspnea on exertion, peripheral edema.  Respiratory: Negative for dyspnea at rest, dyspnea on exertion, cough, sputum, wheezing.  GI: See history of present illness. GU:  Negative for dysuria, hematuria, urinary incontinence, urinary frequency, nocturnal urination.  Endo: Negative for unusual weight  change.    Physical Examination:   BP 106/68 (BP Location: Right Arm, Patient Position: Sitting, Cuff Size: Large)   Pulse 94   Ht 6\' 2"  (1.88 m)   Wt 198 lb (89.8 kg)   BMI 25.42 kg/m   General: Well-nourished, well-developed in no acute distress.  Eyes: No icterus. Conjunctivae pink. Neuro: Alert and oriented x 3.  Grossly intact. Skin: Warm and dry, no jaundice.   Psych: Alert and cooperative, normal mood and affect.  Labs:    Imaging Studies: No results found.  Assessment and Plan:   Harold Tate is a 55 y.o. y/o male who comes in today with a history of imaging consistent with cirrhosis and a upper endoscopy and colonoscopy showing signs of cirrhosis.  The patient has been explained the findings and has also been told to avoid NSAIDs and to avoid iron intake including not cooking with a iron skillet.  He has also been told to watch his red meat and vitamin supplementation to make sure he does not have any excessive iron.  The patient's most recent lab work showed his liver enzymes to be normal.  The patient will have imaging in 6 months for [facility] surveillance and he will have a EGD and colonoscopy in 3 years due to his finding of polyps and varices.  The patient has been explained the plan agrees with it.     Lucilla Lame, MD. Marval Regal  Note: This dictation was prepared with Dragon dictation along with smaller phrase technology. Any transcriptional errors that result from this process are unintentional.

## 2021-03-07 ENCOUNTER — Telehealth (INDEPENDENT_AMBULATORY_CARE_PROVIDER_SITE_OTHER): Payer: 59 | Admitting: Internal Medicine

## 2021-03-07 ENCOUNTER — Encounter: Payer: Self-pay | Admitting: Internal Medicine

## 2021-03-07 DIAGNOSIS — K7460 Unspecified cirrhosis of liver: Secondary | ICD-10-CM | POA: Insufficient documentation

## 2021-03-07 DIAGNOSIS — I10 Essential (primary) hypertension: Secondary | ICD-10-CM

## 2021-03-07 DIAGNOSIS — E785 Hyperlipidemia, unspecified: Secondary | ICD-10-CM

## 2021-03-07 DIAGNOSIS — I700 Atherosclerosis of aorta: Secondary | ICD-10-CM

## 2021-03-07 NOTE — Progress Notes (Addendum)
 There were no vitals taken for this visit.   Subjective:    Patient ID: Harold Tate, male    DOB: 06/23/1965, 55 y.o.   MRN: [id number]  Chief Complaint  Patient presents with   Anxiety   Hypertension    HPI: Harold Tate is a 55 y.o. male   This visit was completed via telephone due to the restrictions of the COVID-19 pandemic. All issues as above were discussed and addressed but no physical exam was performed. If it was felt that the patient should be evaluated in the office, they were directed there. The patient verbally consented to this visit. Patient was unable to complete an audio/visual visit due to Technical difficulties. Due to the catastrophic nature of the COVID-19 pandemic, this visit was done through audio contact only. Location of the patient: home Location of the provider: work Those involved with this call:  Provider: Avanti Vigg, MD CMA: Tammy Daveluy, CMA Front Desk/Registration: [address]  Time spent on call: 15 minutes on the phone discussing health concerns. 1 minutes total spent in review of patient's record and preparation of their chart.    Anxiety Presents for follow-up visit. Patient reports no chest pain, feeling of choking, irritability or malaise.    Hypertension This is a chronic problem. The current episode started more than 1 year ago. The problem is unchanged. Associated symptoms include anxiety. Pertinent negatives include no chest pain.   Chief Complaint  Patient presents with   Anxiety   Hypertension    Relevant past medical, surgical, family and social history reviewed and updated as indicated. Interim medical history since our last visit reviewed. Allergies and medications reviewed and updated.  Review of Systems  Constitutional:  Negative for irritability.  Cardiovascular:  Negative for chest pain.   Per HPI unless specifically indicated above     Objective:    There were no vitals taken for this visit.  Wt  Readings from Last 3 Encounters:  02/15/21 198 lb (89.8 kg)  01/09/21 193 lb 9.6 oz (87.8 kg)  12/27/20 198 lb (89.8 kg)    Physical Exam  Unable to peform sec to virtual visit.   Results for orders placed or performed in visit on 01/18/21  Lipid panel  Result Value Ref Range   Cholesterol, Total 174 100 - 199 mg/dL   Triglycerides 99 0 - 149 mg/dL   HDL 50 >39 mg/dL   VLDL Cholesterol Cal 18 5 - 40 mg/dL   LDL Chol Calc (NIH) 106 (H) 0 - 99 mg/dL   Chol/HDL Ratio 3.5 0.0 - 5.0 ratio  Bayer DCA Hb A1c Waived  Result Value Ref Range   HB A1C (BAYER DCA - WAIVED) 5.6 4.8 - 5.6 %  Thyroid Panel With TSH  Result Value Ref Range   TSH 2.570 0.450 - 4.500 uIU/mL   T4, Total 8.7 4.5 - 12.0 ug/dL   T3 Uptake Ratio 25 24 - 39 %   Free Thyroxine Index 2.2 1.2 - 4.9  CBC with Differential/Platelet  Result Value Ref Range   WBC 5.9 3.4 - 10.8 x10E3/uL   RBC 4.65 4.14 - 5.80 x10E6/uL   Hemoglobin 15.3 13.0 - 17.7 g/dL   Hematocrit 44.7 37.5 - 51.0 %   MCV 96 79 - 97 fL   MCH 32.9 26.6 - 33.0 pg   MCHC 34.2 31.5 - 35.7 g/dL   RDW 12.6 11.6 - 15.4 %   Platelets 140 (L) 150 - 450 x10E3/uL     Neutrophils 46 Not Estab. %   Lymphs 34 Not Estab. %   Monocytes 11 Not Estab. %   Eos 7 Not Estab. %   Basos 2 Not Estab. %   Neutrophils Absolute 2.7 1.4 - 7.0 x10E3/uL   Lymphocytes Absolute 2.0 0.7 - 3.1 x10E3/uL   Monocytes Absolute 0.7 0.1 - 0.9 x10E3/uL   EOS (ABSOLUTE) 0.4 0.0 - 0.4 x10E3/uL   Basophils Absolute 0.1 0.0 - 0.2 x10E3/uL   Immature Granulocytes 0 Not Estab. %   Immature Grans (Abs) 0.0 0.0 - 0.1 x10E3/uL  Comprehensive metabolic panel  Result Value Ref Range   Glucose 103 (H) 65 - 99 mg/dL   BUN 12 6 - 24 mg/dL   Creatinine, Ser 0.75 (L) 0.76 - 1.27 mg/dL   eGFR 107 >59 mL/min/1.73   BUN/Creatinine Ratio 16 9 - 20   Sodium 138 134 - 144 mmol/L   Potassium 4.5 3.5 - 5.2 mmol/L   Chloride 101 96 - 106 mmol/L   CO2 23 20 - 29 mmol/L   Calcium 9.3 8.7 - 10.2 mg/dL    Total Protein 6.8 6.0 - 8.5 g/dL   Albumin 4.4 3.8 - 4.9 g/dL   Globulin, Total 2.4 1.5 - 4.5 g/dL   Albumin/Globulin Ratio 1.8 1.2 - 2.2   Bilirubin Total 0.4 0.0 - 1.2 mg/dL   Alkaline Phosphatase 109 44 - 121 IU/L   AST 19 0 - 40 IU/L   ALT 19 0 - 44 IU/L        Current Outpatient Medications:    clonazePAM (KLONOPIN) 0.5 MG tablet, Take 1 tablet (0.5 mg total) by mouth 3 (three) times daily as needed for anxiety., Disp: 90 tablet, Rfl: 2   rosuvastatin (CRESTOR) 5 MG tablet, Take 5 mg by mouth daily., Disp: , Rfl:    sildenafil (VIAGRA) 100 MG tablet, Take 0.5-1 tablets (50-100 mg total) by mouth daily as needed for erectile dysfunction., Disp: 5 tablet, Rfl: 11    Assessment & Plan:  Aortic atherosclerosis found on scan dated 8/22 Patient is on  statin for such. Consider further referral to cardiology for work-up including stress testing.  Hepatic cirrhosis : has seen Dr. Wohl.  sec to etoh abuse , stopped drinking now per pts verbal record.    Patient has been found to have grade 1 esophageal varices with hypertensive portal gastropathy and a rectal varices.  Hepatosplenomegaly. Quit drinking in January - used to drink beer 30 cans / week    Htn : Continue current meds.  Medication compliance emphasised. pt advised to keep Bp logs. Pt verbalised understanding of the same. Pt to have a low salt diet . Exercise to reach a goal of at least 150 mins a week.  lifestyle modifications explained and pt understands importance of the above.    Problem List Items Addressed This Visit       Cardiovascular and Mediastinum   Aortic atherosclerosis (HCC)   Primary hypertension - Primary     Other   Hyperlipidemia   Other Visit Diagnoses     Hepatic cirrhosis, unspecified hepatic cirrhosis type, unspecified whether ascites present (HCC)       Relevant Orders   Hepatitis B Surface AntiGEN   Hepatitis B core antibody, IgM   Hepatitis C Antibody   Hepatitis A antibody, total         Orders Placed This Encounter  Procedures   Hepatitis B Surface AntiGEN   Hepatitis B core antibody, IgM   Hepatitis C   Antibody   Hepatitis A antibody, total     No orders of the defined types were placed in this encounter.    Follow up plan: Return in about 4 months (around 07/05/2021).

## 2021-03-08 ENCOUNTER — Other Ambulatory Visit: Payer: Self-pay

## 2021-03-08 ENCOUNTER — Other Ambulatory Visit: Payer: 59

## 2021-03-09 LAB — HEPATITIS C ANTIBODY: Hep C Virus Ab: <0.1 s/co ratio (ref 0.0–0.9)

## 2021-03-09 LAB — HEPATITIS B CORE ANTIBODY, IGM: Hep B C IgM: NEGATIVE

## 2021-03-09 LAB — HEPATITIS A ANTIBODY, TOTAL: hep A Total Ab: NEGATIVE

## 2021-03-09 LAB — HEPATITIS B SURFACE ANTIGEN: Hepatitis B Surface Ag: NEGATIVE

## 2021-03-21 ENCOUNTER — Other Ambulatory Visit: Payer: Self-pay | Admitting: Nurse Practitioner

## 2021-03-21 DIAGNOSIS — F411 Generalized anxiety disorder: Secondary | ICD-10-CM

## 2021-03-21 NOTE — Telephone Encounter (Signed)
Requested medication (s) are due for refill today - yes  Requested medication (s) are on the active medication list -yes  Future visit scheduled -no  Last refill: 12/05/20 #90 2RF  Notes to clinic: Request RF: non delegated Rx  Requested Prescriptions  Pending Prescriptions Disp Refills   clonazePAM (KLONOPIN) 0.5 MG tablet [Pharmacy Med Name: CLONAZEPAM 0.5 MG TABLET] 90 tablet 0    Sig: Take 1 tablet (0.5 mg total) by mouth 3 (three) times daily as needed for anxiety.     Not Delegated - Psychiatry:  Anxiolytics/Hypnotics Failed - 03/21/2021 11:06 AM      Failed - This refill cannot be delegated      Passed - Urine Drug Screen completed in last 360 days      Passed - Valid encounter within last 6 months    Recent Outpatient Visits           2 weeks ago Primary hypertension   [place] Vigg, Avanti, MD   1 month ago Portal venous hypertension ([place])   [facility] Vigg, Avanti, MD   2 months ago Hypertension associated with diabetes ([place])   [facility] Vigg, Avanti, MD   2 months ago Diarrhea of infectious origin   [place], Lauren A, NP   3 months ago Hypertension associated with diabetes ([place])   [place], Lauren A, NP                 Requested Prescriptions  Pending Prescriptions Disp Refills   clonazePAM (KLONOPIN) 0.5 MG tablet [Pharmacy Med Name: CLONAZEPAM 0.5 MG TABLET] 90 tablet 0    Sig: Take 1 tablet (0.5 mg total) by mouth 3 (three) times daily as needed for anxiety.     Not Delegated - Psychiatry:  Anxiolytics/Hypnotics Failed - 03/21/2021 11:06 AM      Failed - This refill cannot be delegated      Passed - Urine Drug Screen completed in last 360 days      Passed - Valid encounter within last 6 months    Recent Outpatient Visits           2 weeks ago Primary hypertension   [place] Vigg, Avanti, MD   1 month ago Portal venous hypertension  ([place])   [facility] Vigg, Avanti, MD   2 months ago Hypertension associated with diabetes ([place])   [facility] Vigg, Avanti, MD   2 months ago Diarrhea of infectious origin   [place], Lauren A, NP   3 months ago Hypertension associated with diabetes ([place])   [place] McElwee, Scheryl Darter, NP

## 2021-03-21 NOTE — Telephone Encounter (Signed)
Needs appt

## 2021-03-26 NOTE — Telephone Encounter (Signed)
Cannot rx for tid , please see how many times he takes this and how often.

## 2021-06-29 ENCOUNTER — Other Ambulatory Visit: Payer: Self-pay | Admitting: Internal Medicine

## 2021-06-29 DIAGNOSIS — F411 Generalized anxiety disorder: Secondary | ICD-10-CM

## 2021-06-29 NOTE — Telephone Encounter (Signed)
Requested medication (s) are due for refill today:   Provider to review ? ?Requested medication (s) are on the active medication list:   yes ? ?Future visit scheduled:   No ? ? ?Last ordered: 03/26/2021 #90, 0 refills ? ?Non delegated refill  ? ?Requested Prescriptions  ?Pending Prescriptions Disp Refills  ? clonazePAM (KLONOPIN) 0.5 MG tablet [Pharmacy Med Name: CLONAZEPAM 0.5 MG TABLET] 90 tablet 0  ?  Sig: Take 1 tablet (0.5 mg total) by mouth 3 (three) times daily as needed for anxiety.  ?  ? Not Delegated - Psychiatry: Anxiolytics/Hypnotics 2 Failed - 06/29/2021  2:50 PM  ?  ?  Failed - This refill cannot be delegated  ?  ?  Passed - Urine Drug Screen completed in last 360 days  ?  ?  Passed - Patient is not pregnant  ?  ?  Passed - Valid encounter within last 6 months  ?  Recent Outpatient Visits   ? ?      ? 3 months ago Primary hypertension  ? [facility] Vigg, Avanti, MD  ? 5 months ago Portal venous hypertension ([place])  ? [facility] Vigg, Avanti, MD  ? 5 months ago Hypertension associated with diabetes ([place])  ? [facility] Vigg, Avanti, MD  ? 6 months ago Diarrhea of infectious origin  ? [place], Lauren A, NP  ? 6 months ago Hypertension associated with diabetes ([place])  ? [place] McElwee, Lauren A, NP  ? ?  ?  ? ?  ?  ?  ? ?

## 2021-06-29 NOTE — Telephone Encounter (Signed)
Duplicate request- refused by office today- needs appointment ?Requested Prescriptions  ?Pending Prescriptions Disp Refills  ?? clonazePAM (KLONOPIN) 0.5 MG tablet [Pharmacy Med Name: CLONAZEPAM 0.5 MG TABLET] 90 tablet 0  ?  Sig: Take 1 tablet (0.5 mg total) by mouth 3 (three) times daily as needed for anxiety.  ?  ? Not Delegated - Psychiatry: Anxiolytics/Hypnotics 2 Failed - 06/29/2021  3:36 PM  ?  ?  Failed - This refill cannot be delegated  ?  ?  Passed - Urine Drug Screen completed in last 360 days  ?  ?  Passed - Patient is not pregnant  ?  ?  Passed - Valid encounter within last 6 months  ?  Recent Outpatient Visits   ?      ? 3 months ago Primary hypertension  ? [facility] Vigg, Avanti, MD  ? 5 months ago Portal venous hypertension ([place])  ? [facility] Vigg, Avanti, MD  ? 5 months ago Hypertension associated with diabetes ([place])  ? [facility] Vigg, Avanti, MD  ? 6 months ago Diarrhea of infectious origin  ? [place], Lauren A, NP  ? 6 months ago Hypertension associated with diabetes ([place])  ? [place] McElwee, Lauren A, NP  ?  ?  ? ?  ?  ?  ? ?

## 2021-07-03 ENCOUNTER — Encounter: Payer: Self-pay | Admitting: Internal Medicine

## 2021-07-03 ENCOUNTER — Other Ambulatory Visit: Payer: Self-pay

## 2021-07-03 ENCOUNTER — Ambulatory Visit (INDEPENDENT_AMBULATORY_CARE_PROVIDER_SITE_OTHER): Payer: 59 | Admitting: Internal Medicine

## 2021-07-03 VITALS — BP 115/72 | HR 103 | Temp 97.6°F | Ht 74.02 in | Wt 196.8 lb

## 2021-07-03 DIAGNOSIS — J019 Acute sinusitis, unspecified: Secondary | ICD-10-CM | POA: Insufficient documentation

## 2021-07-03 SURGERY — REPAIR, SHOULDER, BANKART
Anesthesia: Choice | Laterality: Left

## 2021-07-03 MED ORDER — AMOXICILLIN-POT CLAVULANATE 875-125 MG PO TABS: 1.0000 | ORAL_TABLET | Freq: Two times a day (BID) | ORAL | 0 refills | Status: AC

## 2021-07-03 MED ORDER — FEXOFENADINE HCL 180 MG PO TABS: 180.0000 mg | ORAL_TABLET | Freq: Every day | ORAL | 1 refills | Status: DC

## 2021-07-03 NOTE — Progress Notes (Signed)
? ?BP 115/72   Pulse (!) 103   Temp 97.6 ?F (36.4 ?C) (Oral)   Ht 6' 2.02" (1.88 m)   Wt 196 lb 12.8 oz (89.3 kg)   SpO2 96%   BMI 25.26 kg/m?   ? ?Subjective:  ? ? Patient ID: Harold Tate, male    DOB: 1966-04-21, 56 y.o.   MRN: [id number] ? ?Chief Complaint  ?Patient presents with  ?? head congestion  ? ? ?HPI: ?Harold Tate is a 56 y.o. male ? ?Sinus Problem ?This is a new problem. The current episode started in the past 7 days. There has been no fever. Associated symptoms include chills, congestion, headaches, sinus pressure and a sore throat. Pertinent negatives include no hoarse voice, neck pain, shortness of breath, sneezing or swollen glands. (Yellowish and green phelgm cough x 1 week)  ? ?Chief Complaint  ?Patient presents with  ?? head congestion  ? ? ?Relevant past medical, surgical, family and social history reviewed and updated as indicated. Interim medical history since our last visit reviewed. ?Allergies and medications reviewed and updated. ? ?Review of Systems  ?Constitutional:  Positive for chills.  ?HENT:  Positive for congestion, sinus pressure and sore throat. Negative for hoarse voice and sneezing.   ?Respiratory:  Negative for shortness of breath.   ?Musculoskeletal:  Negative for neck pain.  ?Neurological:  Positive for headaches.  ? ?Per HPI unless specifically indicated above ? ?   ?Objective:  ?  ?BP 115/72   Pulse (!) 103   Temp 97.6 ?F (36.4 ?C) (Oral)   Ht 6' 2.02" (1.88 m)   Wt 196 lb 12.8 oz (89.3 kg)   SpO2 96%   BMI 25.26 kg/m?   ?Wt Readings from Last 3 Encounters:  ?07/03/21 196 lb 12.8 oz (89.3 kg)  ?02/15/21 198 lb (89.8 kg)  ?01/09/21 193 lb 9.6 oz (87.8 kg)  ?  ?Physical Exam ?Vitals and nursing note reviewed.  ?Constitutional:   ?   General: He is not in acute distress. ?   Appearance: Normal appearance. He is not ill-appearing or diaphoretic.  ?HENT:  ?   Head: Normocephalic and atraumatic.  ?   Right Ear: Tympanic membrane and external ear normal. There is  no impacted cerumen.  ?   Left Ear: External ear normal.  ?   Nose: No congestion or rhinorrhea.  ?   Mouth/Throat:  ?   Pharynx: No oropharyngeal exudate or posterior oropharyngeal erythema.  ?Eyes:  ?   Conjunctiva/sclera: Conjunctivae normal.  ?   Pupils: Pupils are equal, round, and reactive to light.  ?Cardiovascular:  ?   Rate and Rhythm: Normal rate and regular rhythm.  ?   Heart sounds: No murmur heard. ?  No friction rub. No gallop.  ?Pulmonary:  ?   Effort: No respiratory distress.  ?   Breath sounds: No stridor. No wheezing or rhonchi.  ?Chest:  ?   Chest wall: No tenderness.  ?Abdominal:  ?   General: Abdomen is flat. Bowel sounds are normal.  ?   Palpations: Abdomen is soft. There is no mass.  ?   Tenderness: There is no abdominal tenderness.  ?Musculoskeletal:  ?   Cervical back: Normal range of motion and neck supple. No rigidity or tenderness.  ?   Left lower leg: No edema.  ?Skin: ?   General: Skin is warm and dry.  ?Neurological:  ?   Mental Status: He is alert.  ? ? ?Results for orders placed or  performed in visit on 03/07/21  ?Hepatitis B Surface AntiGEN  ?Result Value Ref Range  ? Hepatitis B Surface Ag Negative Negative  ?Hepatitis B core antibody, IgM  ?Result Value Ref Range  ? Hep B C IgM Negative Negative  ?Hepatitis C Antibody  ?Result Value Ref Range  ? Hep C Virus Ab <0.1 0.0 - 0.9 s/co ratio  ?Hepatitis A antibody, total  ?Result Value Ref Range  ? hep A Total Ab Negative Negative  ? ?   ? ? ?Current Outpatient Medications:  ??  amoxicillin-clavulanate (AUGMENTIN) 875-125 MG tablet, Take 1 tablet by mouth 2 (two) times daily for 7 days., Disp: 14 tablet, Rfl: 0 ??  clonazePAM (KLONOPIN) 0.5 MG tablet, Take 1 tablet (0.5 mg total) by mouth 3 (three) times daily as needed for anxiety., Disp: 90 tablet, Rfl: 0 ??  fexofenadine (ALLEGRA ALLERGY) 180 MG tablet, Take 1 tablet (180 mg total) by mouth daily., Disp: 10 tablet, Rfl: 1 ??  rosuvastatin (CRESTOR) 5 MG tablet, Take 5 mg by mouth  daily., Disp: , Rfl:  ??  sildenafil (VIAGRA) 100 MG tablet, Take 0.5-1 tablets (50-100 mg total) by mouth daily as needed for erectile dysfunction., Disp: 5 tablet, Rfl: 11  ? ? ?Assessment & Plan:  ?Acute sinusitis  ?Start pt on augmentin for such   ?pt advised to take Tylenol q 4- 6 hourly as needed. pt to take allegra q pm as needed and to call office if symptoms worsened pt verbalised understanding of such. ? ?  ? ?Problem List Items Addressed This Visit   ? ?  ? Respiratory  ? Acute sinusitis - Primary  ? Relevant Medications  ? amoxicillin-clavulanate (AUGMENTIN) 875-125 MG tablet  ? fexofenadine (ALLEGRA ALLERGY) 180 MG tablet  ?  ? ?No orders of the defined types were placed in this encounter. ?  ? ?Meds ordered this encounter  ?Medications  ?? amoxicillin-clavulanate (AUGMENTIN) 875-125 MG tablet  ?  Sig: Take 1 tablet by mouth 2 (two) times daily for 7 days.  ?  Dispense:  14 tablet  ?  Refill:  0  ?? fexofenadine (ALLEGRA ALLERGY) 180 MG tablet  ?  Sig: Take 1 tablet (180 mg total) by mouth daily.  ?  Dispense:  10 tablet  ?  Refill:  1  ?  ? ?Follow up plan: ?Return in about 3 months (around 10/03/2021). ? ? ? ?

## 2021-07-09 ENCOUNTER — Telehealth: Payer: Self-pay

## 2021-07-09 NOTE — Telephone Encounter (Signed)
Lm for a reminder of covid test prior to PFT. ? ?07[facility]12/20[facility]  ?

## 2021-07-10 NOTE — Telephone Encounter (Signed)
Patient is aware of below message and voiced her understanding.  Nothing further needed.   

## 2021-07-12 ENCOUNTER — Other Ambulatory Visit: Admission: RE | Admit: 2021-07-12 | Payer: Self-pay | Source: Ambulatory Visit

## 2021-07-13 ENCOUNTER — Ambulatory Visit: Payer: Self-pay | Attending: Pulmonary Disease

## 2021-07-13 ENCOUNTER — Other Ambulatory Visit: Payer: Self-pay | Admitting: Internal Medicine

## 2021-07-13 DIAGNOSIS — F411 Generalized anxiety disorder: Secondary | ICD-10-CM

## 2021-07-13 NOTE — Telephone Encounter (Signed)
Requested medication (s) are due for refill today - yes ? ?Requested medication (s) are on the active medication list -yes ? ?Future visit scheduled -yes ? ?Last refill: 03/26/21 #90 ? ?Notes to clinic: Request RF: non delegated Rx ? ?Requested Prescriptions  ?Pending Prescriptions Disp Refills  ? clonazePAM (KLONOPIN) 0.5 MG tablet [Pharmacy Med Name: CLONAZEPAM 0.5 MG TABLET] 90 tablet 0  ?  Sig: Take 1 tablet (0.5 mg total) by mouth 3 (three) times daily as needed for anxiety.  ?  ? Not Delegated - Psychiatry: Anxiolytics/Hypnotics 2 Failed - 07/13/2021  9:12 AM  ?  ?  Failed - This refill cannot be delegated  ?  ?  Failed - Urine Drug Screen completed in last 360 days  ?  ?  Passed - Patient is not pregnant  ?  ?  Passed - Valid encounter within last 6 months  ?  Recent Outpatient Visits   ? ?      ? 1 week ago Acute sinusitis, recurrence not specified, unspecified location  ? [facility] Vigg, Avanti, MD  ? 4 months ago Primary hypertension  ? [facility] Vigg, Avanti, MD  ? 5 months ago Portal venous hypertension ([place])  ? [facility] Vigg, Avanti, MD  ? 6 months ago Hypertension associated with diabetes ([place])  ? [facility] Vigg, Avanti, MD  ? 6 months ago Diarrhea of infectious origin  ? [facility], Lauren A, NP  ? ?  ?  ?Future Appointments   ? ?        ? In 2 months Vigg, Avanti, MD [facility]  ? ?  ? ?  ?  ?  ? ? ? ?Requested Prescriptions  ?Pending Prescriptions Disp Refills  ? clonazePAM (KLONOPIN) 0.5 MG tablet [Pharmacy Med Name: CLONAZEPAM 0.5 MG TABLET] 90 tablet 0  ?  Sig: Take 1 tablet (0.5 mg total) by mouth 3 (three) times daily as needed for anxiety.  ?  ? Not Delegated - Psychiatry: Anxiolytics/Hypnotics 2 Failed - 07/13/2021  9:12 AM  ?  ?  Failed - This refill cannot be delegated  ?  ?  Failed - Urine Drug Screen completed in last 360 days  ?  ?  Passed - Patient is not pregnant  ?  ?  Passed - Valid  encounter within last 6 months  ?  Recent Outpatient Visits   ? ?      ? 1 week ago Acute sinusitis, recurrence not specified, unspecified location  ? [facility] Vigg, Avanti, MD  ? 4 months ago Primary hypertension  ? [facility] Vigg, Avanti, MD  ? 5 months ago Portal venous hypertension ([place])  ? [facility] Vigg, Avanti, MD  ? 6 months ago Hypertension associated with diabetes ([place])  ? [facility] Vigg, Avanti, MD  ? 6 months ago Diarrhea of infectious origin  ? [facility], Lauren A, NP  ? ?  ?  ?Future Appointments   ? ?        ? In 2 months Vigg, Avanti, MD [facility]  ? ?  ? ?  ?  ?  ? ? ? ?

## 2021-07-20 NOTE — Telephone Encounter (Signed)
Routing to provider to advise. Patient takes RX 3 times daily, only 30 tablets sent in.  ? ?New RX started for 90 tablets. Routing to provider for approval.  ?

## 2021-07-20 NOTE — Telephone Encounter (Addendum)
Pt would like to discuss why a refill for medication clonazePAM (KLONOPIN) 0.5 MG tablet was sent with only 30 tablets instead of 90 tablets.  ? ?Pt requesting a call back.  ?

## 2021-07-20 NOTE — Addendum Note (Signed)
Addended by: Georgina Peer on: 07/20/2021 02:34 PM ? ? Modules accepted: Orders ? ?

## 2021-07-23 MED ORDER — CLONAZEPAM 0.5 MG PO TABS: 0.5000 mg | ORAL_TABLET | Freq: Three times a day (TID) | ORAL | 0 refills | Status: DC | PRN

## 2021-07-24 ENCOUNTER — Other Ambulatory Visit: Payer: Self-pay

## 2021-07-24 ENCOUNTER — Ambulatory Visit (INDEPENDENT_AMBULATORY_CARE_PROVIDER_SITE_OTHER): Payer: 59 | Admitting: Gastroenterology

## 2021-07-24 ENCOUNTER — Encounter: Payer: Self-pay | Admitting: Gastroenterology

## 2021-07-24 VITALS — BP 117/76 | HR 92 | Temp 98.3°F | Wt 193.0 lb

## 2021-07-24 DIAGNOSIS — K7460 Unspecified cirrhosis of liver: Secondary | ICD-10-CM | POA: Diagnosis not present

## 2021-07-24 DIAGNOSIS — Z23 Encounter for immunization: Secondary | ICD-10-CM

## 2021-07-25 LAB — HEPATIC FUNCTION PANEL
ALT: 17 IU/L (ref 0–44)
AST: 20 IU/L (ref 0–40)
Albumin: 4.2 g/dL (ref 3.8–4.9)
Alkaline Phosphatase: 119 IU/L (ref 44–121)
Bilirubin Total: 0.5 mg/dL (ref 0.0–1.2)
Bilirubin, Direct: 0.15 mg/dL (ref 0.00–0.40)
Total Protein: 6.7 g/dL (ref 6.0–8.5)

## 2021-07-25 NOTE — Progress Notes (Signed)
? ? ?Primary Care Physician: Charlynne Cousins, MD ? ?Primary Gastroenterologist:  Dr. Lucilla Lame ? ?Chief Complaint  ?Patient presents with  ? Follow-up  ? ? ?HPI: Harold Tate is a 56 y.o. male here for follow-up with a history of alcoholic cirrhosis.  The patient has stopped all of his alcohol intake. The patient comes with his sister who reports that she has done some Internet searches about supplements the patient can take for his cirrhosis.  The patient denies any nausea vomiting black stools or bloody stools. The patient had grade 1 esophageal varices on his EGD in August of last year. The patient's last liver enzymes were normal. The patient's past blood work showed him not to be immune to hepatitis A and hepatitis B.  The patient has not had a vaccination for these yet. ? ?Past Medical History:  ?Diagnosis Date  ? Anxiety   ? Arthritis   ? Diabetes mellitus without complication ([place])   ? Patient states A1C has gone down, no longer a diabetic  ? Dyspnea   ? Hip pain   ? Hypertension   ? Raynaud's syndrome   ? Sleep apnea   ?  CPAP/ Doesn't use  ? ? ?Current Outpatient Medications  ?Medication Sig Dispense Refill  ? AMINO ACIDS PO Take by mouth.    ? clonazePAM (KLONOPIN) 0.5 MG tablet Take 1 tablet (0.5 mg total) by mouth 3 (three) times daily as needed for anxiety. Take 1 tablet (0.5 mg total) by mouth 3 (three) times daily as needed for anxiety. 90 tablet 0  ? fexofenadine (ALLEGRA ALLERGY) 180 MG tablet Take 1 tablet (180 mg total) by mouth daily. 10 tablet 1  ? rosuvastatin (CRESTOR) 5 MG tablet Take 5 mg by mouth daily.    ? sildenafil (VIAGRA) 100 MG tablet Take 0.5-1 tablets (50-100 mg total) by mouth daily as needed for erectile dysfunction. 5 tablet 11  ? TAURINE PO Take by mouth.    ? ?No current facility-administered medications for this visit.  ? ? ?Allergies as of 07/24/2021  ? (No Known Allergies)  ? ? ?ROS: ? ?General: Negative for anorexia, weight loss, fever, chills, fatigue, weakness. ?ENT:  Negative for hoarseness, difficulty swallowing , nasal congestion. ?CV: Negative for chest pain, angina, palpitations, dyspnea on exertion, peripheral edema.  ?Respiratory: Negative for dyspnea at rest, dyspnea on exertion, cough, sputum, wheezing.  ?GI: See history of present illness. ?GU:  Negative for dysuria, hematuria, urinary incontinence, urinary frequency, nocturnal urination.  ?Endo: Negative for unusual weight change.  ?  ?Physical Examination: ? ? BP 117/76   Pulse 92   Temp 98.3 ?F (36.8 ?C) (Oral)   Wt 193 lb (87.5 kg)   BMI 24.77 kg/m?  ? ?General: Well-nourished, well-developed in no acute distress.  ?Eyes: No icterus. Conjunctivae pink. ?Neuro: Alert and oriented x 3.  Grossly intact. ?Skin: Warm and dry, no jaundice.   ?Psych: Alert and cooperative, normal mood and affect. ? ?Labs:  ?  ?Imaging Studies: ?No results found. ? ?Assessment and Plan:  ? ?Harold Tate is a 56 y.o. y/o male who comes in today with a history of Alcoholic cirrhosis.  The patient has been encouraged to continue his abstinence from alcohol and to avoid iron.  The patient's sister comes with the patient states that he has continued to eat red meat and is curious about starting supplements to help his liver.  The sisters suggestions were reviewed and she was told that the patient can take  supplements but to avoid NSAIDs or vitamins with iron. The patient will have his liver enzymes checked again and he will be set up for a ultrasound for [facility] surveillance. He will also be set up for vaccinations for hepatitis A and hepatitis B. ? ? ? ? ?Lucilla Lame, MD. Mar[facility]al ? ? ? Note: This dictation was prepared with Dragon dictation along with smaller phrase technology. Any transcriptional errors that result from this process are unintentional.  ?

## 2021-07-26 ENCOUNTER — Ambulatory Visit
Admission: RE | Admit: 2021-07-26 | Discharge: 2021-07-26 | Disposition: A | Discharge status: Home / Self Care | Payer: 59 | Source: Ambulatory Visit | Attending: Gastroenterology | Admitting: Gastroenterology

## 2021-07-26 DIAGNOSIS — K7460 Unspecified cirrhosis of liver: Secondary | ICD-10-CM | POA: Insufficient documentation

## 2021-08-01 ENCOUNTER — Telehealth: Payer: Self-pay

## 2021-08-01 NOTE — Telephone Encounter (Signed)
Informed patient and he verbalized understanding of results  

## 2021-08-01 NOTE — Telephone Encounter (Signed)
-----   Message from Lucilla Lame, MD sent at 07/25/2021  6:34 AM EDT ----- ?That the patient know that his liver enzymes are still normal. ?

## 2021-08-01 NOTE — Telephone Encounter (Signed)
Patient verbalized understanding of results put a recall in for 6 months to send  ?

## 2021-08-01 NOTE — Telephone Encounter (Signed)
-----   Message from Lucilla Lame, MD sent at 07/30/2021 11:26 AM EDT ----- ?Let the patient know the th U/S did not show any masses. Repeat U/S in 6 months with a diagnosis of cirrhosis. ?

## 2021-08-22 ENCOUNTER — Other Ambulatory Visit: Payer: 59

## 2021-08-22 ENCOUNTER — Ambulatory Visit: Payer: 59

## 2021-08-23 ENCOUNTER — Ambulatory Visit: Payer: 59

## 2021-09-18 ENCOUNTER — Other Ambulatory Visit: Payer: Self-pay | Admitting: Internal Medicine

## 2021-09-18 ENCOUNTER — Ambulatory Visit (INDEPENDENT_AMBULATORY_CARE_PROVIDER_SITE_OTHER): Payer: 59 | Admitting: Internal Medicine

## 2021-09-18 ENCOUNTER — Ambulatory Visit: Payer: Self-pay | Admitting: *Deleted

## 2021-09-18 ENCOUNTER — Encounter: Payer: Self-pay | Admitting: Internal Medicine

## 2021-09-18 VITALS — BP 119/81 | HR 76 | Temp 98.1°F | Ht 74.02 in | Wt 199.6 lb

## 2021-09-18 DIAGNOSIS — W57XXXA Bitten or stung by nonvenomous insect and other nonvenomous arthropods, initial encounter: Secondary | ICD-10-CM | POA: Insufficient documentation

## 2021-09-18 DIAGNOSIS — F411 Generalized anxiety disorder: Secondary | ICD-10-CM

## 2021-09-18 DIAGNOSIS — R1011 Right upper quadrant pain: Secondary | ICD-10-CM | POA: Diagnosis not present

## 2021-09-18 NOTE — Telephone Encounter (Signed)
Reason for Disposition  [1] MILD-MODERATE pain AND [2] constant AND [3] present > 2 hours  Answer Assessment - Initial Assessment Questions 1. LOCATION: "Where does it hurt?"      RUQ 2. RADIATION: "Does the pain shoot anywhere else?" (e.g., chest, back)     No radiation 3. ONSET: "When did the pain begin?" (e.g., minutes, hours or days ago)      1 week 4. SUDDEN: "Gradual or sudden onset?"     Not sure 5. PATTERN "Does the pain come and go, or is it constant?"    - If constant: "Is it getting better, staying the same, or worsening?"      (Note: Constant means the pain never goes away completely; most serious pain is constant and it progresses)     - If intermittent: "How long does it last?" "Do you have pain now?"     (Note: Intermittent means the pain goes away completely between bouts)     constant 6. SEVERITY: "How bad is the pain?"  (e.g., Scale 1-10; mild, moderate, or severe)    - MILD (1-3): doesn't interfere with normal activities, abdomen soft and not tender to touch     - MODERATE (4-7): interferes with normal activities or awakens from sleep, abdomen tender to touch     - SEVERE (8-10): excruciating pain, doubled over, unable to do any normal activities       moderate 7. RECURRENT SYMPTOM: "Have you ever had this type of stomach pain before?" If Yes, ask: "When was the last time?" and "What happened that time?"      no 8. AGGRAVATING FACTORS: "Does anything seem to cause this pain?" (e.g., foods, stress, alcohol)     no 9. CARDIAC SYMPTOMS: "Do you have any of the following symptoms: chest pain, difficulty breathing, sweating, nausea?"     no 10. OTHER SYMPTOMS: "Do you have any other symptoms?" (e.g., back pain, diarrhea, fever, urination pain, vomiting)       Area can feel warm to touch, fatigue 11. PREGNANCY: "Is there any chance you are pregnant?" "When was your last menstrual period?"  Protocols used: Abdominal Pain - Upper-A-AH

## 2021-09-18 NOTE — Telephone Encounter (Signed)
  Chief Complaint: RUQ pain Symptoms: pain, fatigue, tender Frequency: 1 week Pertinent Negatives: Patient denies chest pain, difficulty breathing, sweating, nausea Disposition: '[]'$ ED /'[]'$ Urgent Care (no appt availability in office) / '[x]'$ Appointment(In office/virtual)/ '[]'$  [facility]/ '[]'$ Home Care/ '[]'$ Refused Recommended Disposition /'[]'$ [facility]/ '[]'$  Follow-up with PCP Additional Notes:

## 2021-09-18 NOTE — Progress Notes (Unsigned)
Ht 6' 2.02" (1.88 m)   Wt 199 lb 9.6 oz (90.5 kg)   BMI 25.62 kg/m    Subjective:    Patient ID: Harold Tate, male    DOB: 05-31-1965, 56 y.o.   MRN: [id number]  Chief Complaint  Patient presents with  . Flank Pain    Started last week, dull pain that is just there, little sensitive to touch, patient states that last night it felt like it had a fever. Patient was talking to his [place] stated that she was given an Abx that messed up her liver and rang his bell about an Abx he was given in 2017 and took for 2 to 3 years after he broke both of his legs.  Patient also states that he has not any get up and go since Sunday    HPI: Harold Tate is a 56 y.o. male  Per pt has had an aunt who had cirrhosis per pt had a staph infection took abx for 2 yrs ? Osteo x 2015 -2016 had an injury to the lower ext. Had surgery for a ? Fracture. 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 no fever or chills.) problem. The current episode started in the past 7 days. The problem occurs intermittently. The pain is located in the RUQ. The pain is at a severity of 2/10. The patient is experiencing no pain (no pain today). The quality of the pain is dull. The abdominal pain radiates to the RUQ. Pertinent negatives include no anorexia, arthralgias, belching, constipation, diarrhea, fever, flatus, frequency, headaches, hematochezia, melena, myalgias, nausea, vomiting or weight loss. There is no history of irritable bowel syndrome.   Chief Complaint  Patient presents with  . Flank Pain    Started last week, dull pain that is just there, little sensitive to touch, patient states that last night it felt like it had a fever. Patient was talking to his [place] stated that she was given an Abx that messed up her liver and rang his bell about an Abx he was given in 2017 and took for 2 to 3 years after he broke both of  his legs.  Patient also states that he has not any get up and go since Sunday    Relevant past medical, surgical, family and social history reviewed and updated as indicated. Interim medical history since our last visit reviewed. Allergies and medications reviewed and updated.  Review of Systems  Constitutional:  Negative for fever and weight loss.  Gastrointestinal:  Positive for abdominal pain. Negative for anorexia, constipation, diarrhea, flatus, hematochezia, melena, nausea and vomiting.  Genitourinary:  Negative for frequency.  Musculoskeletal:  Negative for arthralgias and myalgias.  Neurological:  Negative for headaches.   Per HPI unless specifically indicated above     Objective:    Ht 6' 2.02" (1.88 m)   Wt 199 lb 9.6 oz (90.5 kg)   BMI 25.62 kg/m   Wt Readings from Last 3 Encounters:  09/18/21 199 lb 9.6 oz (90.5 kg)  07/24/21 193 lb (87.5 kg)  07/03/21 196 lb 12.8 oz (89.3 kg)    Physical Exam Vitals and nursing note reviewed.  Constitutional:      General: He is not in acute distress.    Appearance: Normal appearance. He is not ill-appearing or diaphoretic.  HENT:     Head: Normocephalic  and atraumatic.     Right Ear: Tympanic membrane and external ear normal. There is no impacted cerumen.     Left Ear: External ear normal.     Nose: No congestion or rhinorrhea.     Mouth/Throat:     Pharynx: No oropharyngeal exudate or posterior oropharyngeal erythema.  Eyes:     Conjunctiva/sclera: Conjunctivae normal.     Pupils: Pupils are equal, round, and reactive to light.  Cardiovascular:     Rate and Rhythm: Normal rate and regular rhythm.     Heart sounds: No murmur heard.   No friction rub. No gallop.  Pulmonary:     Effort: No respiratory distress.     Breath sounds: No stridor. No wheezing or rhonchi.  Chest:     Chest wall: No tenderness.  Abdominal:     General: Abdomen is flat. Bowel sounds are normal.     Palpations: Abdomen is soft. There is no  mass.     Tenderness: There is no abdominal tenderness.  Musculoskeletal:     Cervical back: Normal range of motion and neck supple. No rigidity or tenderness.     Left lower leg: No edema.  Skin:    General: Skin is warm and dry.  Neurological:     Mental Status: He is alert.   Results for orders placed or performed in visit on 07/24/21  Hepatic function panel  Result Value Ref Range   Total Protein 6.7 6.0 - 8.5 g/dL   Albumin 4.2 3.8 - 4.9 g/dL   Bilirubin Total 0.5 0.0 - 1.2 mg/dL   Bilirubin, Direct 0.15 0.00 - 0.40 mg/dL   Alkaline Phosphatase 119 44 - 121 IU/L   AST 20 0 - 40 IU/L   ALT 17 0 - 44 IU/L        Current Outpatient Medications:  .  AMINO ACIDS PO, Take by mouth., Disp: , Rfl:  .  clonazePAM (KLONOPIN) 0.5 MG tablet, Take 1 tablet (0.5 mg total) by mouth 3 (three) times daily as needed for anxiety. Take 1 tablet (0.5 mg total) by mouth 3 (three) times daily as needed for anxiety., Disp: 90 tablet, Rfl: 0 .  fexofenadine (ALLEGRA ALLERGY) 180 MG tablet, Take 1 tablet (180 mg total) by mouth daily., Disp: 10 tablet, Rfl: 1 .  rosuvastatin (CRESTOR) 5 MG tablet, Take 5 mg by mouth daily., Disp: , Rfl:  .  sildenafil (VIAGRA) 100 MG tablet, Take 0.5-1 tablets (50-100 mg total) by mouth daily as needed for erectile dysfunction., Disp: 5 tablet, Rfl: 11 .  TAURINE PO, Take by mouth., Disp: , Rfl:     US Liver :  IMPRESSION: Cirrhotic morphology of liver.  No sonographic evidence of hepatoma. Continue surveillance with ultrasound every 6 months per AASLD recommendation.   Assessment & Plan:  RUQ pain : Probab needs a repeat scan Last done in 3/23 wll need to fu with Hepatologist Per dr. Lynnell Jude last notes has had Vaccination for hepatitis A and B as he was unvaccinated.    Problem List Items Addressed This Visit   None   No orders of the defined types were placed in this encounter.   No orders of the defined types were placed in this encounter.   Follow  up plan: No follow-ups on file.

## 2021-09-19 NOTE — Telephone Encounter (Signed)
Requested medication (s) are due for refill today: yes  Requested medication (s) are on the active medication list: yes    Last refill: 07/23/21  #90  0 refills  Future visit scheduled yes 10/03/21  Notes to clinic:Not delegated  Requested Prescriptions  Pending Prescriptions Disp Refills   clonazePAM (KLONOPIN) 0.5 MG tablet [Pharmacy Med Name: CLONAZEPAM 0.5 MG TABLET] 90 tablet 0    Sig: Take 1 tablet (0.5 mg total) by mouth 3 (three) times daily as needed for anxiety. Take 1 tablet (0.5 mg total) by mouth 3 (three) times daily as needed for anxiety.     Not Delegated - Psychiatry: Anxiolytics/Hypnotics 2 Failed - 09/18/2021  5:25 PM      Failed - This refill cannot be delegated      Failed - Urine Drug Screen completed in last 360 days      Passed - Patient is not pregnant      Passed - Valid encounter within last 6 months    Recent Outpatient Visits           Yesterday RUQ pain   [facility] Vigg, Avanti, MD   2 months ago Acute sinusitis, recurrence not specified, unspecified location   [facility] Vigg, Avanti, MD   6 months ago Primary hypertension   [facility] Vigg, Avanti, MD   7 months ago Portal venous hypertension ([place])   [facility] Vigg, Avanti, MD   8 months ago Hypertension associated with diabetes ([place])   [facility] Vigg, Avanti, MD       Future Appointments             In 2 weeks Vigg, Avanti, MD [facility]   In 1 month Lucilla Lame, MD [facility]   In 2 months Kathrine Haddock, NP [facility], [place]

## 2021-09-22 LAB — CBC WITH DIFFERENTIAL/PLATELET
Basophils Absolute: 0.1 10*3/uL (ref 0.0–0.2)
Basos: 2 %
EOS (ABSOLUTE): 0.3 10*3/uL (ref 0.0–0.4)
Eos: 5 %
Hematocrit: 45.3 % (ref 37.5–51.0)
Hemoglobin: 15.8 g/dL (ref 13.0–17.7)
Immature Grans (Abs): 0 10*3/uL (ref 0.0–0.1)
Immature Granulocytes: 0 %
Lymphocytes Absolute: 2.2 10*3/uL (ref 0.7–3.1)
Lymphs: 31 %
MCH: 33.8 pg — ABNORMAL HIGH (ref 26.6–33.0)
MCHC: 34.9 g/dL (ref 31.5–35.7)
MCV: 97 fL (ref 79–97)
Monocytes Absolute: 0.6 10*3/uL (ref 0.1–0.9)
Monocytes: 8 %
Neutrophils Absolute: 3.8 10*3/uL (ref 1.4–7.0)
Neutrophils: 54 %
Platelets: 143 10*3/uL — ABNORMAL LOW (ref 150–450)
RBC: 4.67 x10E6/uL (ref 4.14–5.80)
RDW: 12.9 % (ref 11.6–15.4)
WBC: 7.1 10*3/uL (ref 3.4–10.8)

## 2021-09-22 LAB — COMPREHENSIVE METABOLIC PANEL
ALT: 26 IU/L (ref 0–44)
AST: 20 IU/L (ref 0–40)
Albumin/Globulin Ratio: 1.3 (ref 1.2–2.2)
Albumin: 4.1 g/dL (ref 3.8–4.9)
Alkaline Phosphatase: 119 IU/L (ref 44–121)
BUN/Creatinine Ratio: 9 (ref 9–20)
BUN: 7 mg/dL (ref 6–24)
Bilirubin Total: 0.7 mg/dL (ref 0.0–1.2)
CO2: 23 mmol/L (ref 20–29)
Calcium: 9.3 mg/dL (ref 8.7–10.2)
Chloride: 102 mmol/L (ref 96–106)
Creatinine, Ser: 0.81 mg/dL (ref 0.76–1.27)
Globulin, Total: 3.1 g/dL (ref 1.5–4.5)
Glucose: 143 mg/dL — ABNORMAL HIGH (ref 70–99)
Potassium: 4.4 mmol/L (ref 3.5–5.2)
Sodium: 139 mmol/L (ref 134–144)
Total Protein: 7.2 g/dL (ref 6.0–8.5)
eGFR: 104 mL/min/{1.73_m2} (ref >59)

## 2021-09-22 LAB — ROCKY MTN SPOTTED FVR ABS PNL(IGG+IGM)
RMSF IgG: NEGATIVE
RMSF IgM: 0.38 index (ref 0.00–0.89)

## 2021-09-22 LAB — EHRLICHIA ANTIBODY PANEL
E. Chaffeensis (HME) IgM Titer: NEGATIVE
E.Chaffeensis (HME) IgG: NEGATIVE
HGE IgG Titer: NEGATIVE
HGE IgM Titer: NEGATIVE

## 2021-09-22 LAB — BABESIA MICROTI ANTIBODY PANEL
Babesia microti IgG: <1:10 {titer}
Babesia microti IgM: <1:10 {titer}

## 2021-09-25 NOTE — Progress Notes (Signed)
Please let pt know this was normal except bl sugars were high, not sure if pt was fasting, can he get an a1c tommoroow? Pl check. All tick borne tests were -ve

## 2021-10-02 ENCOUNTER — Telehealth: Payer: Self-pay

## 2021-10-02 NOTE — Telephone Encounter (Signed)
Patient was called and informed that he did not need to follow up tomorrow as it was too early. Patient verbalized understanding

## 2021-10-03 ENCOUNTER — Ambulatory Visit: Payer: 59 | Admitting: Internal Medicine

## 2021-10-03 NOTE — Telephone Encounter (Signed)
Pt called and wanted to know what his appt for today was about.   Pt given message that he did not need to come in as it is too early, and the appt had been cancelled.  Pt states he did not talk to anyone yesterday about his appt and did not know it had been cancelled. Pt is OK with not coming in. He states he is doing good.

## 2021-10-09 ENCOUNTER — Ambulatory Visit: Payer: 59 | Admitting: Gastroenterology

## 2021-10-23 ENCOUNTER — Other Ambulatory Visit: Payer: Self-pay | Admitting: Nurse Practitioner

## 2021-10-31 ENCOUNTER — Ambulatory Visit: Payer: 59 | Admitting: Gastroenterology

## 2021-11-14 ENCOUNTER — Telehealth: Payer: Self-pay

## 2021-11-14 NOTE — Telephone Encounter (Signed)
Spoke with patient and he just wanted to cancel appt at this time

## 2021-11-20 ENCOUNTER — Ambulatory Visit: Payer: 59 | Admitting: Unknown Physician Specialty

## 2021-11-21 ENCOUNTER — Ambulatory Visit: Payer: 59 | Admitting: Unknown Physician Specialty

## 2021-11-22 ENCOUNTER — Other Ambulatory Visit: Payer: Self-pay | Admitting: Internal Medicine

## 2021-11-22 DIAGNOSIS — F411 Generalized anxiety disorder: Secondary | ICD-10-CM

## 2021-11-22 NOTE — Telephone Encounter (Signed)
Medication Refill - Medication:  clonazePAM (KLONOPIN) 0.5 MG tablet  Has the patient contacted their pharmacy? No. (Agent: If no, request that the patient contact the pharmacy for the refill. If patient does not wish to contact the pharmacy document the reason why and proceed with request.) (Agent: If yes, when and what did the pharmacy advise?)  Preferred Pharmacy (with phone number or street name): [facility]  Has the patient been seen for an appointment in the last year OR does the patient have an upcoming appointment? Yes.    Agent: Please be advised that RX refills may take up to 3 business days. We ask that you follow-up with your pharmacy.

## 2021-11-23 NOTE — Telephone Encounter (Signed)
Requested medication (s) are due for refill today: yes  Requested medication (s) are on the active medication list: yes  Last refill:  09/20/21 #30/1  Future visit scheduled: no  Notes to clinic:  Unable to refill per protocol, cannot delegate, pt PCP no longer at practice     Requested Prescriptions  Pending Prescriptions Disp Refills   clonazePAM (KLONOPIN) 0.5 MG tablet 30 tablet 1    Sig: Take 1 tablet (0.5 mg total) by mouth 3 (three) times daily as needed for anxiety. Take 1 tablet (0.5 mg total) by mouth 3 (three) times daily as needed for anxiety.     Not Delegated - Psychiatry: Anxiolytics/Hypnotics 2 Failed - 11/22/2021  2:24 PM      Failed - This refill cannot be delegated      Failed - Urine Drug Screen completed in last 360 days      Passed - Patient is not pregnant      Passed - Valid encounter within last 6 months    Recent Outpatient Visits           2 months ago RUQ pain   [facility] Vigg, Avanti, MD   4 months ago Acute sinusitis, recurrence not specified, unspecified location   [facility] Vigg, Avanti, MD   8 months ago Primary hypertension   [place] Vigg, Avanti, MD   9 months ago Portal venous hypertension ([place])   [facility] Vigg, Avanti, MD   10 months ago Hypertension associated with diabetes ([place])   [facility] Vigg, Avanti, MD

## 2021-11-23 NOTE — Telephone Encounter (Signed)
Please schedule with Junie Panning or Malachy Mood

## 2021-11-26 MED ORDER — CLONAZEPAM 0.5 MG PO TABS: 0.5000 mg | ORAL_TABLET | Freq: Three times a day (TID) | ORAL | 0 refills | Status: DC | PRN

## 2021-11-26 NOTE — Telephone Encounter (Signed)
LVM asking patient to call back to schedule an appointment 

## 2021-11-26 NOTE — Telephone Encounter (Signed)
Patient notified that RX was sent in to get to appointment.

## 2021-11-26 NOTE — Telephone Encounter (Signed)
Patient scheduled for tomorrow with Harold Tate. Wants to know if medication can be called in to get to tomorrow or if he has to wait until he is seen. Please refuse if cannot be sent in.

## 2021-11-26 NOTE — Telephone Encounter (Signed)
Pt called back and scheduled appt/ pt asked if his refill will be able to be called in to  to [facility] / please call pt and advise if Rx can be called in today or if he has to wait until his appt

## 2021-11-27 ENCOUNTER — Ambulatory Visit: Payer: 59 | Admitting: Physician Assistant

## 2021-11-27 ENCOUNTER — Ambulatory Visit (INDEPENDENT_AMBULATORY_CARE_PROVIDER_SITE_OTHER): Payer: 59 | Admitting: Physician Assistant

## 2021-11-27 ENCOUNTER — Encounter: Payer: Self-pay | Admitting: Physician Assistant

## 2021-11-27 VITALS — BP 132/69 | HR 83 | Temp 98.2°F | Wt 205.6 lb

## 2021-11-27 DIAGNOSIS — Z0283 Encounter for blood-alcohol and blood-drug test: Secondary | ICD-10-CM

## 2021-11-27 DIAGNOSIS — F411 Generalized anxiety disorder: Secondary | ICD-10-CM

## 2021-11-27 DIAGNOSIS — R69 Illness, unspecified: Secondary | ICD-10-CM | POA: Diagnosis not present

## 2021-11-27 MED ORDER — CLONAZEPAM 0.5 MG PO TABS: 0.5000 mg | ORAL_TABLET | Freq: Three times a day (TID) | ORAL | 0 refills | Status: DC | PRN

## 2021-11-27 NOTE — Assessment & Plan Note (Addendum)
Chronic, historic condition Reports he has been taking Klonopin for years PDMP reviewed, no evidence of worrisome controlled substance use patterns at this time  Has tried several antidepressants and anxiolytics but these have had adverse effects and he does not like the way they make him feel He has signed a controlled substance agreement and is amenable to urine drug screen to complete this.  Reports his anxiety is well controlled with this at this time.  Continue current medications  Follow up as needed

## 2021-11-27 NOTE — Patient Instructions (Signed)
Please make an apt to discuss your diabetes and high blood pressure so we can make sure your medications are up to date and we are monitoring

## 2021-11-27 NOTE — Progress Notes (Signed)
Established Patient Office Visit  Name: Harold Tate   MRN: [id number]    DOB: 1965-06-11   Date:11/27/2021  Today's Provider: Talitha Givens, MHS, PA-C Introduced myself to the patient as a PA-C and provided education on APPs in clinical practice.         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  Reports occasional marijuana use but denies other illicit substances        11/27/2021    3:46 PM 07/03/2021   10:06 AM 03/07/2021    3:00 PM 12/05/2020    4:40 PM  GAD 7 : Generalized Anxiety Score  Nervous, Anxious, on Edge 3 0 1 1  Control/stop worrying 3 0 1 3  Worry too much - different things 3 0 1 3  Trouble relaxing 2 0 0 3  Restless 2 0 0 1  Easily annoyed or irritable 0 0 1 1  Afraid - awful might happen 1 0 0 2  Total GAD 7 Score 14 0 4 14  Anxiety Difficulty Very difficult Not difficult at all Not difficult at all Not difficult at all       Depression          11/27/2021    3:46 PM 07/03/2021   10:05 AM 03/07/2021    2:59 PM 01/09/2021    3:59 PM 12/05/2020    4:40 PM  Depression screen PHQ 2/9  Decreased Interest 0 0 0 0 0  Down, Depressed, Hopeless 0 0 0 0 0  PHQ - 2 Score 0 0 0 0 0  Altered sleeping 1 0 0 0 0  Tired, decreased energy 1 0 0 0 0  Change in appetite 0 0 0 0 0  Feeling bad or failure about yourself  0 0 0 0 0  Trouble concentrating 0 0 0 0 0  Moving slowly or fidgety/restless 0 0 0 0 0  Suicidal thoughts 0 0 0 0 0  PHQ-9 Score 2 0 0 0 0  Difficult  doing work/chores Not difficult at all Not difficult at all Not difficult at all Not difficult at all Not difficult at all       Patient Active Problem List   Diagnosis Date Noted   RUQ pain 09/18/2021   Tick bite 09/18/2021   Acute sinusitis 07/03/2021   Hepatic cirrhosis ([place]) 03/07/2021   Prediabetes 02/01/2021   Primary hypertension 02/01/2021   Hyperlipidemia 02/01/2021   Aortic atherosclerosis ([place]) 12/28/2020   Esophageal varices in alcoholic cirrhosis ([place])    Screen for colon cancer    Polyp of ascending colon    Palpitation 12/06/2020   COVID-19 56/21/3086   Alcoholic cirrhosis of liver without ascites ([place]) 08/24/2020   Family history of polyps in the colon 08/24/2020   Portal venous hypertension ([place]) 08/24/2020   Thrombocytopenia ([place]) 05/31/2020   B12 deficiency 04/24/2020   Hereditary spastic paraparesis (  [place]) 04/22/2020   Right leg DVT ([place]) 09/03/2019   Long-term current use of benzodiazepine 06/10/2019   Status post total hip replacement, left 02/11/2019   Depression, major, single episode, moderate ([place]) 01/11/2019   Sexual dysfunction 11/19/2018   Status post total hip replacement, right 11/03/2018   Osteoarthritis of both hips 10/01/2018   Type 2 diabetes mellitus with obesity ([place]) 11/05/2016   Hyperlipidemia associated with type 2 diabetes mellitus ([place]) 06/06/2016   Raynaud's syndrome 09/30/2014   Generalized anxiety disorder 09/30/2014   Nicotine dependence, cigarettes, uncomplicated 79/39/0300   Hypertension associated with diabetes ([place]) 09/30/2014    Past Surgical History:  Procedure Laterality Date   COLONOSCOPY WITH PROPOFOL N/A 12/08/2020   Procedure: COLONOSCOPY WITH PROPOFOL;  Surgeon: Lucilla Lame, MD;  Location: [facility];  Service: Endoscopy;  Laterality: N/A;   ESOPHAGOGASTRODUODENOSCOPY (EGD) WITH PROPOFOL N/A 12/08/2020   Procedure: ESOPHAGOGASTRODUODENOSCOPY (EGD) WITH PROPOFOL;  Surgeon: Lucilla Lame, MD;  Location: [facility];  Service: Endoscopy;  Laterality: N/A;   I & D EXTREMITY     OPEN REDUCTION INTERNAL FIXATION (ORIF) TIBIA/FIBULA FRACTURE Left July 2014   POLYPECTOMY N/A 12/08/2020   Procedure: POLYPECTOMY;  Surgeon: Lucilla Lame, MD;  Location: [facility];  Service: Endoscopy;  Laterality: N/A;   TOTAL HIP ARTHROPLASTY Right 11/03/2018   Procedure: RIGHT TOTAL HIP ARTHROPLASTY ANTERIOR APPROACH;  Surgeon: Hessie Knows, MD;  Location: [facility];  Service: Orthopedics;  Laterality: Right;   TOTAL HIP ARTHROPLASTY Left 02/11/2019   Procedure: TOTAL HIP ARTHROPLASTY ANTERIOR APPROACH;  Surgeon: Hessie Knows, MD;  Location: [facility];  Service: Orthopedics;  Laterality: Left;    Family History  Problem Relation Age of Onset   Depression Sister     Social History   Tobacco Use   Smoking status: Every Day    Packs/day: 1.00    Years: 27.00    Total pack years: 27.00    Types: Cigarettes   Smokeless tobacco: Never  Substance Use Topics   Alcohol use: Not Currently     Current Outpatient Medications:    AMINO ACIDS PO, Take by mouth., Disp: , Rfl:    baclofen (LIORESAL) 10 MG tablet, Take 10 mg by mouth 3 (three) times daily., Disp: , Rfl:    fexofenadine (ALLEGRA ALLERGY) 180 MG tablet, Take 1 tablet (180 mg total) by mouth daily., Disp: 10 tablet, Rfl: 1   rosuvastatin (CRESTOR) 5 MG tablet, Take 5 mg by mouth daily., Disp: , Rfl:    sildenafil (VIAGRA) 100 MG tablet, Take 0.5-1 tablets (50-100 mg total) by mouth daily as needed for erectile dysfunction., Disp: 5 tablet, Rfl: 11   TAURINE PO, Take by mouth., Disp: , Rfl:    clonazePAM (KLONOPIN) 0.5 MG tablet, Take 1 tablet (0.5 mg total) by mouth 3 (three) times daily as needed for anxiety. Take 1 tablet (0.5 mg total) by mouth 3 (three) times daily as needed for anxiety., Disp: 90 tablet, Rfl: 0  No Known Allergies  I personally reviewed active problem list, medication list, allergies, notes from last encounter, notes from  last 2 encounters, lab results with the patient/caregiver today.   Review of Systems  Psychiatric/Behavioral:  Positive for depression. The patient is nervous/anxious.       Objective  Vitals:   11/27/21 1545  BP: 132/69  Pulse: 83  Temp: 98.2 F (36.8 C)  TempSrc: Oral  SpO2: 95%  Weight: 205 lb 9.6 oz (93.3 kg)    Body mass index is 26.39  kg/m.  Physical Exam Vitals reviewed.  Constitutional:      Appearance: Normal appearance. He is normal weight.  HENT:     Head: Normocephalic and atraumatic.  Cardiovascular:     Rate and Rhythm: Normal rate and regular rhythm.     Pulses: Normal pulses.     Heart sounds: Normal heart sounds. No murmur heard.    No friction rub. No gallop.  Pulmonary:     Effort: Pulmonary effort is normal.     Breath sounds: Normal breath sounds. No decreased air movement. No decreased breath sounds, wheezing, rhonchi or rales.  Musculoskeletal:     Right lower leg: No edema.     Left lower leg: No edema.  Neurological:     Mental Status: He is alert.  Psychiatric:        Attention and Perception: Attention and perception normal.        Mood and Affect: Mood and affect normal.        Speech: Speech normal.        Behavior: Behavior normal. Behavior is cooperative.      Recent Results (from the past 2160 hour(s))  CBC with Differential/Platelet     Status: Abnormal   Collection Time: 09/18/21  4:37 PM  Result Value Ref Range   WBC 7.1 3.4 - 10.8 x10E3/uL   RBC 4.67 4.14 - 5.80 x10E6/uL   Hemoglobin 15.8 13.0 - 17.7 g/dL   Hematocrit 45.3 37.5 - 51.0 %   MCV 97 79 - 97 fL   MCH 33.8 (H) 26.6 - 33.0 pg   MCHC 34.9 31.5 - 35.7 g/dL   RDW 12.9 11.6 - 15.4 %   Platelets 143 (L) 150 - 450 x10E3/uL   Neutrophils 54 Not Estab. %   Lymphs 31 Not Estab. %   Monocytes 8 Not Estab. %   Eos 5 Not Estab. %   Basos 2 Not Estab. %   Neutrophils Absolute 3.8 1.4 - 7.0 x10E3/uL   Lymphocytes Absolute 2.2 0.7 - 3.1 x10E3/uL   Monocytes  Absolute 0.6 0.1 - 0.9 x10E3/uL   EOS (ABSOLUTE) 0.3 0.0 - 0.4 x10E3/uL   Basophils Absolute 0.1 0.0 - 0.2 x10E3/uL   Immature Granulocytes 0 Not Estab. %   Immature Grans (Abs) 0.0 0.0 - 0.1 x10E3/uL  Comprehensive metabolic panel     Status: Abnormal   Collection Time: 09/18/21  4:37 PM  Result Value Ref Range   Glucose 143 (H) 70 - 99 mg/dL   BUN 7 6 - 24 mg/dL   Creatinine, Ser 0.81 0.76 - 1.27 mg/dL   eGFR 104 >59 mL/min/1.73   BUN/Creatinine Ratio 9 9 - 20   Sodium 139 134 - 144 mmol/L   Potassium 4.4 3.5 - 5.2 mmol/L   Chloride 102 96 - 106 mmol/L   CO2 23 20 - 29 mmol/L   Calcium 9.3 8.7 - 10.2 mg/dL   Total Protein 7.2 6.0 - 8.5 g/dL   Albumin 4.1 3.8 - 4.9 g/dL   Globulin, Total 3.1 1.5 - 4.5 g/dL   Albumin/Globulin Ratio 1.3 1.2 - 2.2   Bilirubin Total 0.7 0.0 - 1.2 mg/dL   Alkaline Phosphatase 119 44 - 121 IU/L   AST 20 0 - 40 IU/L   ALT 26 0 - 44 IU/L  Rocky mtn spotted fvr abs pnl(IgG+IgM)     Status: None   Collection Time: 09/18/21  4:37 PM  Result Value Ref Range   RMSF IgG Negative Negative  RMSF IgM 0.38 0.00 - 0.89 index    Comment:                                  Negative        <0.90                                  Equivocal 0.90 - 1.10                                  Positive        >7.16   Ehrlichia antibody panel     Status: None   Collection Time: 09/18/21  4:37 PM  Result Value Ref Range   E.Chaffeensis (HME) IgG Negative Neg:<1:64   E. Chaffeensis (HME) IgM Titer Negative Neg:<1:20    Comment: IgG titers if 1:64 or greater indicate exposure or  acute and convalescent samples showing a four-fold increase, and/or the presence of IgM indicate recent or current infection.    HGE IgG Titer Negative Neg:<1:64    Comment: HGE IgG levels are detectable 7 to 10 days post infection and persist approximately one year.    HGE IgM Titer Negative Neg:<1:20    Comment: Due to a reagent backorder, this test was performed using a different assay. The  reference interval for this alternate assay is:                                        Negative      <1:64                                        Positive       1:64 or greater IgM levels usually rise 3 to 5 days post infection and fall to normal levels in approximately 30 to 60 days.   Babesia microti Antibody Panel     Status: None   Collection Time: 09/18/21  4:37 PM  Result Value Ref Range   Babesia microti IgG <1:10 Neg:<1:10   Babesia microti IgM <1:10 Neg:<1:10     PHQ2/9:    11/27/2021    3:46 PM 07/03/2021   10:05 AM 03/07/2021    2:59 PM 01/09/2021    3:59 PM 12/05/2020    4:40 PM  Depression screen PHQ 2/9  Decreased Interest 0 0 0 0 0  Down, Depressed, Hopeless 0 0 0 0 0  PHQ - 2 Score 0 0 0 0 0  Altered sleeping 1 0 0 0 0  Tired, decreased energy 1 0 0 0 0  Change in appetite 0 0 0 0 0  Feeling bad or failure about yourself  0 0 0 0 0  Trouble concentrating 0 0 0 0 0  Moving slowly or fidgety/restless 0 0 0 0 0  Suicidal thoughts 0 0 0 0 0  PHQ-9 Score 2 0 0 0 0  Difficult doing work/chores Not difficult at all Not difficult at all Not difficult at all Not difficult at all Not difficult at all      Fall Risk:    11/27/2021  3:46 PM 07/03/2021   10:05 AM 01/29/2021   11:45 AM 01/09/2021    3:58 PM 05/30/2020    4:15 PM  Fall Risk   Falls in the past year? 0 0 0 0 1  Number falls in past yr: 0 0 0 0   Injury with Fall? 0 0 0 0   Risk for fall due to : No Fall Risks No Fall Risks  No Fall Risks   Follow up Falls evaluation completed Falls evaluation completed Falls evaluation completed        Functional Status Survey:      Assessment & Plan  Problem List Items Addressed This Visit       Other   Generalized anxiety disorder - Primary (Chronic)    Chronic, historic condition Reports he has been taking Klonopin for years PDMP reviewed, no evidence of worrisome controlled substance use patterns at this time  Has tried several antidepressants and  anxiolytics but these have had adverse effects and he does not like the way they make him feel He has signed a controlled substance agreement and is amenable to urine drug screen to complete this.  Reports his anxiety is well controlled with this at this time.  Continue current medications  Follow up as needed       Relevant Medications   clonazePAM (KLONOPIN) 0.5 MG tablet   Other Visit Diagnoses     Encounter for drug screening       Relevant Orders   [id number] 11+Oxyco+Alc+Crt-Bund        Return in about 4 weeks (around 12/25/2021) for Diabetes follow up.   I, Nashly Olsson E Keaton Stirewalt, PA-C, have reviewed all documentation for this visit. The documentation on 11/27/21 for the exam, diagnosis, procedures, and orders are all accurate and complete.   Talitha Givens, MHS, PA-C [facility]

## 2021-11-29 LAB — DRUG SCREEN 764883 11+OXYCO+ALC+CRT-BUND
Amphetamines, Urine: NEGATIVE ng/mL
BENZODIAZ UR QL: NEGATIVE ng/mL
Barbiturate: NEGATIVE ng/mL
Cannabinoid Quant, Ur: NEGATIVE ng/mL
Cocaine (Metabolite): NEGATIVE ng/mL
Creatinine: 84.7 mg/dL (ref 20.0–300.0)
Ethanol: NEGATIVE %
Meperidine: NEGATIVE ng/mL
Methadone Screen, Urine: NEGATIVE ng/mL
OPIATE SCREEN URINE: NEGATIVE ng/mL
Oxycodone/Oxymorphone, Urine: NEGATIVE ng/mL
Phencyclidine: NEGATIVE ng/mL
Propoxyphene: NEGATIVE ng/mL
Tramadol Ur: NEGATIVE ng/mL
pH, Urine: 6.9 (ref 4.5–8.9)

## 2021-12-25 ENCOUNTER — Ambulatory Visit: Payer: 59 | Admitting: Physician Assistant

## 2021-12-25 NOTE — Progress Notes (Deleted)
Established Patient Office Visit  Name: Harold Tate   MRN: [id number]    DOB: July 16, 1965   Date:12/25/2021  Today's Provider: Talitha Givens, MHS, PA-C Introduced myself to the patient as a PA-C and provided education on APPs in clinical practice.         Subjective  Chief Complaint  No chief complaint on file.   HPI   Patient Active Problem List   Diagnosis Date Noted   RUQ pain 09/18/2021   Tick bite 09/18/2021   Acute sinusitis 07/03/2021   Hepatic cirrhosis ([place]) 03/07/2021   Prediabetes 02/01/2021   Primary hypertension 02/01/2021   Hyperlipidemia 02/01/2021   Aortic atherosclerosis ([place]) 12/28/2020   Esophageal varices in alcoholic cirrhosis ([place])    Screen for colon cancer    Polyp of ascending colon    Palpitation 12/06/2020   COVID-19 13/11/6576   Alcoholic cirrhosis of liver without ascites ([place]) 08/24/2020   Family history of polyps in the colon 08/24/2020   Portal venous hypertension ([place]) 08/24/2020   Thrombocytopenia ([place]) 05/31/2020   B12 deficiency 04/24/2020   Hereditary spastic paraparesis ([employer]) 04/22/2020   Right leg DVT ([place]) 09/03/2019   Long-term current use of benzodiazepine 06/10/2019   Status post total hip replacement, left 02/11/2019   Depression, major, single episode, moderate ([place]) 01/11/2019   Sexual dysfunction 11/19/2018   Status post total hip replacement, right 11/03/2018   Osteoarthritis of both hips 10/01/2018   Type 2 diabetes mellitus with obesity ([place]) 11/05/2016   Hyperlipidemia associated with type 2 diabetes mellitus ([place]) 06/06/2016   Raynaud's syndrome 09/30/2014   Generalized anxiety disorder 09/30/2014   Nicotine dependence, cigarettes, uncomplicated 46/96/2952   Hypertension associated with diabetes ([place]) 09/30/2014    Past Surgical History:  Procedure Laterality Date   COLONOSCOPY WITH PROPOFOL N/A 12/08/2020   Procedure: COLONOSCOPY WITH PROPOFOL;  Surgeon: Lucilla Lame, MD;  Location: [place];  Service: Endoscopy;  Laterality: N/A;   ESOPHAGOGASTRODUODENOSCOPY (EGD) WITH PROPOFOL N/A 12/08/2020   Procedure: ESOPHAGOGASTRODUODENOSCOPY (EGD) WITH PROPOFOL;  Surgeon: Lucilla Lame, MD;  Location: [place] [facility];  Service: Endoscopy;  Laterality: N/A;   I & D EXTREMITY     OPEN REDUCTION INTERNAL FIXATION (ORIF) TIBIA/FIBULA FRACTURE Left July 2014   POLYPECTOMY N/A 12/08/2020   Procedure: POLYPECTOMY;  Surgeon: Lucilla Lame, MD;  Location: [place];  Service: Endoscopy;  Laterality: N/A;   TOTAL HIP ARTHROPLASTY Right 11/03/2018   Procedure: RIGHT TOTAL HIP ARTHROPLASTY ANTERIOR APPROACH;  Surgeon: Hessie Knows, MD;  Location: [facility];  Service: Orthopedics;  Laterality: Right;   TOTAL HIP ARTHROPLASTY Left 02/11/2019   Procedure: TOTAL HIP ARTHROPLASTY ANTERIOR APPROACH;  Surgeon: Hessie Knows, MD;  Location: [facility];  Service: Orthopedics;  Laterality: Left;    Family History  Problem Relation Age of Onset   Depression Sister     Social History   Tobacco Use   Smoking status: Every Day    Packs/day: 1.00    Years: 27.00    Total pack years: 27.00    Types: Cigarettes   Smokeless tobacco: Never  Substance Use Topics   Alcohol use: Not Currently     Current Outpatient Medications:    AMINO ACIDS PO, Take by mouth., Disp: , Rfl:    baclofen (LIORESAL) 10 MG tablet, Take 10 mg by mouth 3 (three) times daily., Disp: , Rfl:    clonazePAM (KLONOPIN) 0.5 MG tablet, Take 1 tablet (0.5 mg total) by mouth  3 (three) times daily as needed for anxiety. Take 1 tablet (0.5 mg total) by mouth 3 (three) times daily as needed for anxiety., Disp: 90 tablet, Rfl: 0   fexofenadine (ALLEGRA ALLERGY) 180 MG tablet, Take 1 tablet (180 mg total) by mouth daily., Disp: 10 tablet, Rfl: 1   rosuvastatin (CRESTOR) 5 MG tablet, Take 5 mg by mouth daily., Disp: , Rfl:    sildenafil (VIAGRA) 100 MG tablet, Take 0.5-1 tablets (50-100 mg total) by mouth daily as needed for erectile  dysfunction., Disp: 5 tablet, Rfl: 11   TAURINE PO, Take by mouth., Disp: , Rfl:   No Known Allergies  I personally reviewed {Reviewed:14835} with the patient/caregiver today.   ROS    Objective  There were no vitals filed for this visit.  There is no height or weight on file to calculate BMI.  Physical Exam   Recent Results (from the past 2160 hour(s))  [id number] 11+Oxyco+Alc+Crt-Bund     Status: None   Collection Time: 11/27/21  4:42 PM  Result Value Ref Range   Ethanol Negative Cutoff=0.020 %   Amphetamines, Urine Negative Cutoff=1000 ng/mL    Comment: Amphetamine test includes Amphetamine and Methamphetamine.   Barbiturate Negative Cutoff=200 ng/mL   BENZODIAZ UR QL Negative Cutoff=200 ng/mL   Cannabinoid Quant, Ur Negative Cutoff=50 ng/mL   Cocaine (Metabolite) Negative Cutoff=300 ng/mL   OPIATE SCREEN URINE Negative Cutoff=300 ng/mL    Comment: Opiate test includes Codeine, Morphine, Hydromorphone, Hydrocodone.   Oxycodone/Oxymorphone, Urine Negative Cutoff=300 ng/mL    Comment: Test includes Oxycodone and Oxymorphone   Phencyclidine Negative Cutoff=25 ng/mL   Methadone Screen, Urine Negative Cutoff=300 ng/mL   Propoxyphene Negative Cutoff=300 ng/mL   Meperidine Negative Cutoff=200 ng/mL    Comment: This test was developed and its performance characteristics determined by [facility]. It has not been cleared or approved by the Food and Drug Administration.    Tramadol Negative Cutoff=200 ng/mL   Creatinine 84.7 20.0 - 300.0 mg/dL   pH, Urine 6.9 4.5 - 8.9     PHQ2/9:    11/27/2021    3:46 PM 07/03/2021   10:05 AM 03/07/2021    2:59 PM 01/09/2021    3:59 PM 12/05/2020    4:40 PM  Depression screen PHQ 2/9  Decreased Interest 0 0 0 0 0  Down, Depressed, Hopeless 0 0 0 0 0  PHQ - 2 Score 0 0 0 0 0  Altered sleeping 1 0 0 0 0  Tired, decreased energy 1 0 0 0 0  Change in appetite 0 0 0 0 0  Feeling bad or failure about yourself  0 0 0 0 0  Trouble concentrating 0  0 0 0 0  Moving slowly or fidgety/restless 0 0 0 0 0  Suicidal thoughts 0 0 0 0 0  PHQ-9 Score 2 0 0 0 0  Difficult doing work/chores Not difficult at all Not difficult at all Not difficult at all Not difficult at all Not difficult at all      Fall Risk:    11/27/2021    3:46 PM 07/03/2021   10:05 AM 01/29/2021   11:45 AM 01/09/2021    3:58 PM 05/30/2020    4:15 PM  Fall Risk   Falls in the past year? 0 0 0 0 1  Number falls in past yr: 0 0 0 0   Injury with Fall? 0 0 0 0   Risk for fall due to : No Fall Risks No Fall Risks  No  Fall Risks   Follow up Falls evaluation completed Falls evaluation completed Falls evaluation completed        Functional Status Survey:      Assessment & Plan

## 2021-12-31 ENCOUNTER — Other Ambulatory Visit: Payer: Self-pay | Admitting: Physician Assistant

## 2021-12-31 DIAGNOSIS — F411 Generalized anxiety disorder: Secondary | ICD-10-CM

## 2022-01-02 ENCOUNTER — Telehealth: Payer: Self-pay | Admitting: Physician Assistant

## 2022-01-02 NOTE — Telephone Encounter (Signed)
Pt called to see if his refills can be for 3 months supply or 3 month of refills sent to pharmacy for Clonazepam / pt missed appt due to not getting a reminder and has an appt on Monday coming up / please advise if refill will be sent [place] today / please advise when this is filled

## 2022-01-02 NOTE — Telephone Encounter (Signed)
Requested medications are due for refill today.  yes  Requested medications are on the active medications list.  yes  Last refill. 11/27/2021 #90 0 refills  Future visit scheduled.   yes  Notes to clinic.  No pcp listed. Refill not delegated.    Requested Prescriptions  Pending Prescriptions Disp Refills   clonazePAM (KLONOPIN) 0.5 MG tablet [Pharmacy Med Name: CLONAZEPAM 0.5 MG TABLET] 90 tablet 0    Sig: Take 1 tablet (0.5 mg total) by mouth 3 (three) times daily as needed for anxiety.     Not Delegated - Psychiatry: Anxiolytics/Hypnotics 2 Failed - 12/31/2021 12:18 PM      Failed - This refill cannot be delegated      Passed - Urine Drug Screen completed in last 360 days      Passed - Patient is not pregnant      Passed - Valid encounter within last 6 months    Recent Outpatient Visits           1 month ago Generalized anxiety disorder   [place], Erin E, PA-C   3 months ago RUQ pain   [facility] Vigg, Avanti, MD   6 months ago Acute sinusitis, recurrence not specified, unspecified location   [facility] Vigg, Avanti, MD   10 months ago Primary hypertension   [place] Vigg, Avanti, MD   11 months ago Portal venous hypertension ([place])   [place], MD       Future Appointments             In 5 days Mecum, Dani Gobble, PA-C [employer], [facility]

## 2022-01-03 ENCOUNTER — Other Ambulatory Visit: Payer: Self-pay

## 2022-01-03 DIAGNOSIS — K7460 Unspecified cirrhosis of liver: Secondary | ICD-10-CM

## 2022-01-04 ENCOUNTER — Other Ambulatory Visit: Payer: Self-pay | Admitting: Physician Assistant

## 2022-01-04 DIAGNOSIS — F411 Generalized anxiety disorder: Secondary | ICD-10-CM

## 2022-01-04 NOTE — Telephone Encounter (Signed)
Patient is out of the medication,  clonazePAM 0.5 MG . He is asking for a temporary supply ntil it comes ina t pharmacy.  [place], [place] - [address]  [place] [address]  Phone: [phone] Fax: [phone]

## 2022-01-04 NOTE — Telephone Encounter (Signed)
Requested medication (s) are due for refill today -yes  Requested medication (s) are on the active medication list -yes  Future visit scheduled -yes  Last refill: 11/27/21 #90  Notes to clinic: non delegated Rx  Requested Prescriptions  Pending Prescriptions Disp Refills   clonazePAM (KLONOPIN) 0.5 MG tablet [Pharmacy Med Name: CLONAZEPAM 0.5 MG TABLET] 90 tablet 0    Sig: Take 1 tablet (0.5 mg total) by mouth 3 (three) times daily as needed for anxiety.     Not Delegated - Psychiatry: Anxiolytics/Hypnotics 2 Failed - 01/04/2022  8:56 AM      Failed - This refill cannot be delegated      Passed - Urine Drug Screen completed in last 360 days      Passed - Patient is not pregnant      Passed - Valid encounter within last 6 months    Recent Outpatient Visits           1 month ago Generalized anxiety disorder   [facility] Mecum, Erin E, PA-C   3 months ago RUQ pain   [facility] Vigg, Avanti, MD   6 months ago Acute sinusitis, recurrence not specified, unspecified location   [facility] Vigg, Avanti, MD   10 months ago Primary hypertension   [place] Vigg, Avanti, MD   11 months ago Portal venous hypertension ([place])   [place], Avanti, MD       Future Appointments             In 3 days Mecum, Dani Gobble, PA-C [facility]               Requested Prescriptions  Pending Prescriptions Disp Refills   clonazePAM (KLONOPIN) 0.5 MG tablet [Pharmacy Med Name: CLONAZEPAM 0.5 MG TABLET] 90 tablet 0    Sig: Take 1 tablet (0.5 mg total) by mouth 3 (three) times daily as needed for anxiety.     Not Delegated - Psychiatry: Anxiolytics/Hypnotics 2 Failed - 01/04/2022  8:56 AM      Failed - This refill cannot be delegated      Passed - Urine Drug Screen completed in last 360 days      Passed - Patient is not pregnant      Passed - Valid encounter within last 6 months    Recent Outpatient  Visits           1 month ago Generalized anxiety disorder   [place], Erin E, PA-C   3 months ago RUQ pain   [facility] Vigg, Avanti, MD   6 months ago Acute sinusitis, recurrence not specified, unspecified location   [facility] Vigg, Avanti, MD   10 months ago Primary hypertension   [place] Vigg, Avanti, MD   11 months ago Portal venous hypertension ([place])   [place], MD       Future Appointments             In 3 days Mecum, Dani Gobble, PA-C [employer], [facility]

## 2022-01-04 NOTE — Telephone Encounter (Signed)
Requested medication (s) are due for refill today - yes  Requested medication (s) are on the active medication list -yes  Future visit scheduled -yes  Last refill: 11/27/21 #90  Notes to clinic: non delegated Rx-duplicate request  Requested Prescriptions  Pending Prescriptions Disp Refills   clonazePAM (KLONOPIN) 0.5 MG tablet [Pharmacy Med Name: CLONAZEPAM 0.5 MG TABLET] 90 tablet 0    Sig: Take 1 tablet (0.5 mg total) by mouth 3 (three) times daily as needed for anxiety.     Not Delegated - Psychiatry: Anxiolytics/Hypnotics 2 Failed - 01/04/2022  8:56 AM      Failed - This refill cannot be delegated      Passed - Urine Drug Screen completed in last 360 days      Passed - Patient is not pregnant      Passed - Valid encounter within last 6 months    Recent Outpatient Visits           1 month ago Generalized anxiety disorder   [facility] Mecum, Erin E, PA-C   3 months ago RUQ pain   [facility] Vigg, Avanti, MD   6 months ago Acute sinusitis, recurrence not specified, unspecified location   [facility] Vigg, Avanti, MD   10 months ago Primary hypertension   [place] Vigg, Avanti, MD   11 months ago Portal venous hypertension ([place])   [place], Avanti, MD       Future Appointments             In 3 days Mecum, Dani Gobble, PA-C [facility], [facility]               Requested Prescriptions  Pending Prescriptions Disp Refills   clonazePAM (KLONOPIN) 0.5 MG tablet [Pharmacy Med Name: CLONAZEPAM 0.5 MG TABLET] 90 tablet 0    Sig: Take 1 tablet (0.5 mg total) by mouth 3 (three) times daily as needed for anxiety.     Not Delegated - Psychiatry: Anxiolytics/Hypnotics 2 Failed - 01/04/2022  8:56 AM      Failed - This refill cannot be delegated      Passed - Urine Drug Screen completed in last 360 days      Passed - Patient is not pregnant      Passed - Valid encounter within last 6 months     Recent Outpatient Visits           1 month ago Generalized anxiety disorder   [place], Erin E, PA-C   3 months ago RUQ pain   [facility] Vigg, Avanti, MD   6 months ago Acute sinusitis, recurrence not specified, unspecified location   [facility] Vigg, Avanti, MD   10 months ago Primary hypertension   [place] Vigg, Avanti, MD   11 months ago Portal venous hypertension ([place])   [place], MD       Future Appointments             In 3 days Mecum, Dani Gobble, PA-C [facility], [facility]

## 2022-01-07 ENCOUNTER — Encounter: Payer: Self-pay | Admitting: Physician Assistant

## 2022-01-07 ENCOUNTER — Ambulatory Visit (INDEPENDENT_AMBULATORY_CARE_PROVIDER_SITE_OTHER): Payer: 59 | Admitting: Physician Assistant

## 2022-01-07 DIAGNOSIS — F411 Generalized anxiety disorder: Secondary | ICD-10-CM

## 2022-01-07 DIAGNOSIS — I152 Hypertension secondary to endocrine disorders: Secondary | ICD-10-CM | POA: Diagnosis not present

## 2022-01-07 DIAGNOSIS — E1169 Type 2 diabetes mellitus with other specified complication: Secondary | ICD-10-CM

## 2022-01-07 DIAGNOSIS — E669 Obesity, unspecified: Secondary | ICD-10-CM

## 2022-01-07 DIAGNOSIS — E1159 Type 2 diabetes mellitus with other circulatory complications: Secondary | ICD-10-CM | POA: Diagnosis not present

## 2022-01-07 DIAGNOSIS — R69 Illness, unspecified: Secondary | ICD-10-CM | POA: Diagnosis not present

## 2022-01-07 MED ORDER — CLONAZEPAM 0.5 MG PO TABS: 0.5000 mg | ORAL_TABLET | Freq: Three times a day (TID) | ORAL | 0 refills | Status: DC | PRN

## 2022-01-07 NOTE — Progress Notes (Signed)
Established Patient Office Visit  Name: Harold Tate   MRN: [id number]    DOB: 1966/02/04   Date:01/07/2022  Today's Provider: Talitha Givens, MHS, PA-C Introduced myself to the patient as a PA-C and provided education on APPs in clinical practice.         Subjective  Chief Complaint  Chief Complaint  Patient presents with   Medication Refill    Klonopin refill    HPI    Diabetes, Type 2 - Last A1c 5.6 from 01/18/21 - Medications: Nothing  - Compliance: Not taking medications  - Checking BG at home: Not checking  - Statin: yes - PNA vaccine: NA - Denies symptoms of hypoglycemia, polyuria, polydipsia, numbness extremities, foot ulcers/trauma  HYPERTENSION / [place] Satisfied with current treatment? yes Duration of hypertension:  Unsure of chronicity  BP monitoring frequency: not checking BP range:  BP medication side effects:  No medications  Past BP meds: none previously on Losartan  Duration of hyperlipidemia: chronic Cholesterol medication side effects: no Cholesterol supplements: none Past cholesterol medications: rosuvastatin (crestor) Medication compliance: poor compliance Aspirin: no Recent stressors: no Recurrent headaches: no Visual changes: no Palpitations: no Dyspnea: no Chest pain: no Lower extremity edema: no Dizzy/lightheaded: no   ANXIETY/STRESS Duration:controlled Anxious mood: no  Excessive worrying: no Irritability: no  Sweating: no Nausea: no Palpitations:no Hyperventilation: no Panic attacks: yes- medication has run out  Agoraphobia: no  Obscessions/compulsions: no Depressed mood: no    12/24/21    3:46 PM 07/03/2021   10:05 AM 03/07/2021    2:59 PM 01/09/2021    3:59 PM 12/05/2020    4:40 PM  Depression screen PHQ 2/9  Decreased Interest 0 0 0 0 0  Down, Depressed, Hopeless 0 0 0 0 0  PHQ - 2 Score 0 0 0 0 0  Altered sleeping 1 0 0 0 0  Tired, decreased energy 1 0 0 0 0  Change in appetite 0 0 0 0 0  Feeling  bad or failure about yourself  0 0 0 0 0  Trouble concentrating 0 0 0 0 0  Moving slowly or fidgety/restless 0 0 0 0 0  Suicidal thoughts 0 0 0 0 0  PHQ-9 Score 2 0 0 0 0  Difficult doing work/chores Not difficult at all Not difficult at all Not difficult at all Not difficult at all Not difficult at all   Anhedonia: no Weight changes: no Insomnia: no      Hypersomnia: no Fatigue/loss of energy: no Feelings of worthlessness: no Feelings of guilt: no Impaired concentration/indecisiveness: no Suicidal ideations: no  Crying spells: no Recent Stressors/Life Changes: no   Relationship problems: no   Family stress: no     Financial stress: no    Job stress: no    Recent death/loss: no    12-24-2021    3:46 PM 07/03/2021   10:06 AM 03/07/2021    3:00 PM 12/05/2020    4:40 PM  GAD 7 : Generalized Anxiety Score  Nervous, Anxious, on Edge 3 0 1 1  Control/stop worrying 3 0 1 3  Worry too much - different things 3 0 1 3  Trouble relaxing 2 0 0 3  Restless 2 0 0 1  Easily annoyed or irritable 0 0 1 1  Afraid - awful might happen 1 0 0 2  Total GAD 7 Score 14 0 4 14  Anxiety Difficulty Very difficult Not difficult at all Not difficult  at all Not difficult at all        Patient Active Problem List   Diagnosis Date Noted   RUQ pain 09/18/2021   Tick bite 09/18/2021   Acute sinusitis 07/03/2021   Hepatic cirrhosis ([place]) 03/07/2021   Prediabetes 02/01/2021   Primary hypertension 02/01/2021   Hyperlipidemia 02/01/2021   Aortic atherosclerosis ([place]) 12/28/2020   Esophageal varices in alcoholic cirrhosis ([place])    Screen for colon cancer    Polyp of ascending colon    Palpitation 12/06/2020   COVID-19 37/01/6268   Alcoholic cirrhosis of liver without ascites ([place]) 08/24/2020   Family history of polyps in the colon 08/24/2020   Portal venous hypertension ([place]) 08/24/2020   Thrombocytopenia ([place]) 05/31/2020   B12 deficiency 04/24/2020   Hereditary spastic paraparesis ([place])  04/22/2020   Right leg DVT ([place]) 09/03/2019   Long-term current use of benzodiazepine 06/10/2019   Status post total hip replacement, left 02/11/2019   Depression, major, single episode, moderate ([place]) 01/11/2019   Sexual dysfunction 11/19/2018   Status post total hip replacement, right 11/03/2018   Osteoarthritis of both hips 10/01/2018   Type 2 diabetes mellitus with obesity ([place]) 11/05/2016   Hyperlipidemia associated with type 2 diabetes mellitus ([place]) 06/06/2016   Raynaud's syndrome 09/30/2014   Generalized anxiety disorder 09/30/2014   Nicotine dependence, cigarettes, uncomplicated 48/54/6270   Hypertension associated with diabetes ([place]) 09/30/2014    Past Surgical History:  Procedure Laterality Date   COLONOSCOPY WITH PROPOFOL N/A 12/08/2020   Procedure: COLONOSCOPY WITH PROPOFOL;  Surgeon: Lucilla Lame, MD;  Location: [facility];  Service: Endoscopy;  Laterality: N/A;   ESOPHAGOGASTRODUODENOSCOPY (EGD) WITH PROPOFOL N/A 12/08/2020   Procedure: ESOPHAGOGASTRODUODENOSCOPY (EGD) WITH PROPOFOL;  Surgeon: Lucilla Lame, MD;  Location: [facility];  Service: Endoscopy;  Laterality: N/A;   I & D EXTREMITY     OPEN REDUCTION INTERNAL FIXATION (ORIF) TIBIA/FIBULA FRACTURE Left July 2014   POLYPECTOMY N/A 12/08/2020   Procedure: POLYPECTOMY;  Surgeon: Lucilla Lame, MD;  Location: [facility];  Service: Endoscopy;  Laterality: N/A;   TOTAL HIP ARTHROPLASTY Right 11/03/2018   Procedure: RIGHT TOTAL HIP ARTHROPLASTY ANTERIOR APPROACH;  Surgeon: Hessie Knows, MD;  Location: [facility];  Service: Orthopedics;  Laterality: Right;   TOTAL HIP ARTHROPLASTY Left 02/11/2019   Procedure: TOTAL HIP ARTHROPLASTY ANTERIOR APPROACH;  Surgeon: Hessie Knows, MD;  Location: [facility];  Service: Orthopedics;  Laterality: Left;    Family History  Problem Relation Age of Onset   Depression Sister     Social History   Tobacco Use   Smoking status: Every Day    Packs/day: 1.00     Years: 27.00    Total pack years: 27.00    Types: Cigarettes   Smokeless tobacco: Never  Substance Use Topics   Alcohol use: Not Currently     Current Outpatient Medications:    AMINO ACIDS PO, Take by mouth., Disp: , Rfl:    baclofen (LIORESAL) 10 MG tablet, Take 10 mg by mouth 3 (three) times daily., Disp: , Rfl:    [START ON 02/07/2022] clonazePAM (KLONOPIN) 0.5 MG tablet, Take 1 tablet (0.5 mg total) by mouth 3 (three) times daily as needed for anxiety., Disp: 90 tablet, Rfl: 0   [START ON 03/10/2022] clonazePAM (KLONOPIN) 0.5 MG tablet, Take 1 tablet (0.5 mg total) by mouth 3 (three) times daily as needed for anxiety., Disp: 90 tablet, Rfl: 0   fexofenadine (ALLEGRA ALLERGY) 180 MG tablet, Take 1 tablet (180 mg total)  by mouth daily., Disp: 10 tablet, Rfl: 1   rosuvastatin (CRESTOR) 5 MG tablet, Take 5 mg by mouth daily., Disp: , Rfl:    sildenafil (VIAGRA) 100 MG tablet, Take 0.5-1 tablets (50-100 mg total) by mouth daily as needed for erectile dysfunction., Disp: 5 tablet, Rfl: 11   TAURINE PO, Take by mouth., Disp: , Rfl:    clonazePAM (KLONOPIN) 0.5 MG tablet, Take 1 tablet (0.5 mg total) by mouth 3 (three) times daily as needed for anxiety. Take 1 tablet (0.5 mg total) by mouth 3 (three) times daily as needed for anxiety., Disp: 90 tablet, Rfl: 0  No Known Allergies  I personally reviewed active problem list, medication list, allergies, health maintenance, notes from last encounter, lab results with the patient/caregiver today.   Review of Systems  Psychiatric/Behavioral:  Negative for depression. The patient is nervous/anxious. The patient does not have insomnia.       Objective  Vitals:   01/07/22 0946  BP: 117/78  Pulse: 80  Temp: 97.8 F (36.6 C)  TempSrc: Oral  SpO2: 96%  Weight: 210 lb 8 oz (95.5 kg)  Height: 6' 2.02" (1.88 m)    Body mass index is 27.01 kg/m.  Physical Exam Vitals reviewed.  Constitutional:      General: He is awake.      Appearance: Normal appearance. He is well-developed, well-groomed and normal weight.  HENT:     Head: Normocephalic and atraumatic.  Eyes:     General: Lids are normal. Gaze aligned appropriately.     Pupils: Pupils are equal, round, and reactive to light.  Pulmonary:     Effort: Pulmonary effort is normal.  Musculoskeletal:     Cervical back: Normal range of motion.  Neurological:     General: No focal deficit present.     Mental Status: He is alert and oriented to person, place, and time.     GCS: GCS eye subscore is 4. GCS verbal subscore is 5. GCS motor subscore is 6.     Cranial Nerves: No dysarthria or facial asymmetry.     Motor: No tremor, atrophy or abnormal muscle tone.     Gait: Gait is intact.  Psychiatric:        Attention and Perception: Attention and perception normal.        Mood and Affect: Mood and affect normal.        Speech: Speech normal.        Behavior: Behavior normal. Behavior is cooperative.        Thought Content: Thought content normal.      Recent Results (from the past 2160 hour(s))  [id number] 11+Oxyco+Alc+Crt-Bund     Status: None   Collection Time: 11/27/21  4:42 PM  Result Value Ref Range   Ethanol Negative Cutoff=0.020 %   Amphetamines, Urine Negative Cutoff=1000 ng/mL    Comment: Amphetamine test includes Amphetamine and Methamphetamine.   Barbiturate Negative Cutoff=200 ng/mL   BENZODIAZ UR QL Negative Cutoff=200 ng/mL   Cannabinoid Quant, Ur Negative Cutoff=50 ng/mL   Cocaine (Metabolite) Negative Cutoff=300 ng/mL   OPIATE SCREEN URINE Negative Cutoff=300 ng/mL    Comment: Opiate test includes Codeine, Morphine, Hydromorphone, Hydrocodone.   Oxycodone/Oxymorphone, Urine Negative Cutoff=300 ng/mL    Comment: Test includes Oxycodone and Oxymorphone   Phencyclidine Negative Cutoff=25 ng/mL   Methadone Screen, Urine Negative Cutoff=300 ng/mL   Propoxyphene Negative Cutoff=300 ng/mL   Meperidine Negative Cutoff=200 ng/mL    Comment: This  test was developed and its performance characteristics  determined by [facility]. It has not been cleared or approved by the Food and Drug Administration.    Tramadol Negative Cutoff=200 ng/mL   Creatinine 84.7 20.0 - 300.0 mg/dL   pH, Urine 6.9 4.5 - 8.9     PHQ2/9:    11/27/2021    3:46 PM 07/03/2021   10:05 AM 03/07/2021    2:59 PM 01/09/2021    3:59 PM 12/05/2020    4:40 PM  Depression screen PHQ 2/9  Decreased Interest 0 0 0 0 0  Down, Depressed, Hopeless 0 0 0 0 0  PHQ - 2 Score 0 0 0 0 0  Altered sleeping 1 0 0 0 0  Tired, decreased energy 1 0 0 0 0  Change in appetite 0 0 0 0 0  Feeling bad or failure about yourself  0 0 0 0 0  Trouble concentrating 0 0 0 0 0  Moving slowly or fidgety/restless 0 0 0 0 0  Suicidal thoughts 0 0 0 0 0  PHQ-9 Score 2 0 0 0 0  Difficult doing work/chores Not difficult at all Not difficult at all Not difficult at all Not difficult at all Not difficult at all      Fall Risk:    11/27/2021    3:46 PM 07/03/2021   10:05 AM 01/29/2021   11:45 AM 01/09/2021    3:58 PM 05/30/2020    4:15 PM  Fall Risk   Falls in the past year? 0 0 0 0 1  Number falls in past yr: 0 0 0 0   Injury with Fall? 0 0 0 0   Risk for fall due to : No Fall Risks No Fall Risks  No Fall Risks   Follow up Falls evaluation completed Falls evaluation completed Falls evaluation completed        Functional Status Survey:      Assessment & Plan  Problem List Items Addressed This Visit       Cardiovascular and Mediastinum   Hypertension associated with diabetes ([place])    Unsure of chronicity  Patient does not appear to be taking antihypertensive agent at this time.  BP is in goal today Discussed using statin regularly to control cholesterol Recommend follow up in 3 months to discuss medications and get labs          Endocrine   Type 2 diabetes mellitus with obesity ([place])    Unsure of status- last A1c was 5.6 last year Not currently taking medications Recommend he  follow up in 3 months and labs obtained for monitoring          Other   Generalized anxiety disorder (Chronic)    Chronic, historic condition Patient reports his symptoms are well controlled on Clonazepam 0.5 mg PO TID  Reviewed GAD7 with patient today - reports some exacerbations to anxiety due to running out of medicine but otherwise feels well controlled PDMP reviewed, no evidence of worrisome controlled substance use patterns at this time  Follow up in 3 months for monitoring and refills      Relevant Medications   clonazePAM (KLONOPIN) 0.5 MG tablet   clonazePAM (KLONOPIN) 0.5 MG tablet (Start on 02/07/2022)   clonazePAM (KLONOPIN) 0.5 MG tablet (Start on 03/10/2022)     Return in about 3 months (around 04/08/2022) for Anxiety.   I, Ryka Beighley E Francys Bolin, PA-C, have reviewed all documentation for this visit. The documentation on 01/07/22 for the exam, diagnosis, procedures, and orders are all accurate and complete.  Talitha Givens, MHS, PA-C [facility]

## 2022-01-07 NOTE — Assessment & Plan Note (Signed)
Chronic, historic condition Patient reports his symptoms are well controlled on Clonazepam 0.5 mg PO TID  Reviewed GAD7 with patient today - reports some exacerbations to anxiety due to running out of medicine but otherwise feels well controlled PDMP reviewed, no evidence of worrisome controlled substance use patterns at this time  Follow up in 3 months for monitoring and refills

## 2022-01-07 NOTE — Assessment & Plan Note (Signed)
Unsure of status- last A1c was 5.6 last year Not currently taking medications Recommend he follow up in 3 months and labs obtained for monitoring

## 2022-01-07 NOTE — Assessment & Plan Note (Signed)
Unsure of chronicity  Patient does not appear to be taking antihypertensive agent at this time.  BP is in goal today Discussed using statin regularly to control cholesterol Recommend follow up in 3 months to discuss medications and get labs

## 2022-01-21 ENCOUNTER — Telehealth: Payer: Self-pay

## 2022-01-21 NOTE — Telephone Encounter (Signed)
-----   Message from Lurlean Nanny, [place] sent at 08/22/2021  5:02 PM EDT ----- Regarding: Repeat U/S in 6 months with a diagnosis of cirrhosis.  ----- Message ----- From: Shelby Mattocks, CMA Sent: 08/01/2021   3:04 PM EDT To: Lurlean Nanny, CMA   ----- Message ----- From: Shelby Mattocks, CMA Sent: 12/28/2021  12:00 AM EDT To: Lurlean Nanny, CMA  Repeat U/S in 6 months with a diagnosis of cirrhosis.

## 2022-01-21 NOTE — Telephone Encounter (Signed)
Left detailed msg on VM per HIPAA  Pt has [place] scheduled for Oct 2 at [facility], arrive at 9:45 am, NPO after 12 midnight

## 2022-01-21 NOTE — Telephone Encounter (Signed)
Pt is aware as instructed and expresses understanding

## 2022-01-28 ENCOUNTER — Ambulatory Visit
Admission: RE | Admit: 2022-01-28 | Discharge: 2022-01-28 | Disposition: A | Discharge status: Home / Self Care | Payer: 59 | Source: Ambulatory Visit | Attending: Gastroenterology | Admitting: Gastroenterology

## 2022-01-28 DIAGNOSIS — K7460 Unspecified cirrhosis of liver: Secondary | ICD-10-CM | POA: Diagnosis not present

## 2022-02-01 NOTE — Telephone Encounter (Signed)
Opened but did not need.

## 2022-03-26 DIAGNOSIS — G629 Polyneuropathy, unspecified: Secondary | ICD-10-CM | POA: Diagnosis not present

## 2022-03-26 DIAGNOSIS — G114 Hereditary spastic paraplegia: Secondary | ICD-10-CM | POA: Diagnosis not present

## 2022-04-07 NOTE — Patient Instructions (Signed)

## 2022-04-08 ENCOUNTER — Ambulatory Visit: Payer: 59 | Admitting: Physician Assistant

## 2022-04-09 ENCOUNTER — Ambulatory Visit: Payer: 59 | Admitting: Nurse Practitioner

## 2022-04-09 ENCOUNTER — Encounter: Payer: Self-pay | Admitting: Nurse Practitioner

## 2022-04-09 VITALS — BP 121/79 | HR 70 | Temp 97.8°F | Ht 74.02 in | Wt 207.6 lb

## 2022-04-09 DIAGNOSIS — N400 Enlarged prostate without lower urinary tract symptoms: Secondary | ICD-10-CM | POA: Diagnosis not present

## 2022-04-09 DIAGNOSIS — E785 Hyperlipidemia, unspecified: Secondary | ICD-10-CM | POA: Diagnosis not present

## 2022-04-09 DIAGNOSIS — E1159 Type 2 diabetes mellitus with other circulatory complications: Secondary | ICD-10-CM

## 2022-04-09 DIAGNOSIS — G114 Hereditary spastic paraplegia: Secondary | ICD-10-CM

## 2022-04-09 DIAGNOSIS — I700 Atherosclerosis of aorta: Secondary | ICD-10-CM | POA: Diagnosis not present

## 2022-04-09 DIAGNOSIS — E669 Obesity, unspecified: Secondary | ICD-10-CM | POA: Diagnosis not present

## 2022-04-09 DIAGNOSIS — Z79899 Other long term (current) drug therapy: Secondary | ICD-10-CM

## 2022-04-09 DIAGNOSIS — F411 Generalized anxiety disorder: Secondary | ICD-10-CM

## 2022-04-09 DIAGNOSIS — K7469 Other cirrhosis of liver: Secondary | ICD-10-CM

## 2022-04-09 DIAGNOSIS — E538 Deficiency of other specified B group vitamins: Secondary | ICD-10-CM

## 2022-04-09 DIAGNOSIS — E1169 Type 2 diabetes mellitus with other specified complication: Secondary | ICD-10-CM

## 2022-04-09 DIAGNOSIS — D696 Thrombocytopenia, unspecified: Secondary | ICD-10-CM | POA: Diagnosis not present

## 2022-04-09 DIAGNOSIS — I152 Hypertension secondary to endocrine disorders: Secondary | ICD-10-CM | POA: Diagnosis not present

## 2022-04-09 DIAGNOSIS — F321 Major depressive disorder, single episode, moderate: Secondary | ICD-10-CM

## 2022-04-09 DIAGNOSIS — F17210 Nicotine dependence, cigarettes, uncomplicated: Secondary | ICD-10-CM | POA: Diagnosis not present

## 2022-04-09 DIAGNOSIS — K7030 Alcoholic cirrhosis of liver without ascites: Secondary | ICD-10-CM

## 2022-04-09 DIAGNOSIS — R69 Illness, unspecified: Secondary | ICD-10-CM | POA: Diagnosis not present

## 2022-04-09 MED ORDER — CLONAZEPAM 0.5 MG PO TABS: 0.5000 mg | ORAL_TABLET | Freq: Three times a day (TID) | ORAL | 2 refills | Status: DC | PRN

## 2022-04-09 MED ORDER — ROSUVASTATIN CALCIUM 5 MG PO TABS: 5.0000 mg | ORAL_TABLET | Freq: Every day | ORAL | 4 refills | Status: DC

## 2022-04-09 NOTE — Assessment & Plan Note (Signed)
Chronic, ongoing.  Continue Crestor at this time, discussed at length.  Lipid panel today.

## 2022-04-09 NOTE — Assessment & Plan Note (Signed)
Chronic, ongoing with no medication.  Recent 5.6%.  Focus on diabetic diet at home, discussed with him, and recommend he check blood sugar at least weekly. Return in 3 months.

## 2022-04-09 NOTE — Assessment & Plan Note (Signed)
I have recommended complete cessation of tobacco use. I have discussed various options available for assistance with tobacco cessation including over the counter methods (Nicotine gum, patch and lozenges). We also discussed prescription options (Chantix, Nicotine Inhaler / Nasal Spray). The patient is not interested in pursuing any prescription tobacco cessation options at this time.  Recommend he return to lung cancer screening, which he refuses at this time.

## 2022-04-09 NOTE — Assessment & Plan Note (Signed)
Chronic, ongoing.  Has been on benzo long term.  Discussed at length risks of long term benzo use.  He is aware of risks.  Refill sent #90 plus 2 refills.  UDS up to date.  Will refer to psychiatry if any increased doses requested, he refuses visit with them at this time.  Contract next visit.

## 2022-04-09 NOTE — Assessment & Plan Note (Signed)
Chronic, stable = no current medications.  BP at goal in office today.  Recommend he monitor BP at least a few mornings a week at home and document.  DASH diet at home.  Continue current medication regimen and adjust as needed.  Labs today: CBC, CMP, TSH, Lipid, urine ALB.

## 2022-04-09 NOTE — Assessment & Plan Note (Signed)
Chronic, noted on past CT scan.  Recommend he consistently take Crestor and cut back on smoking, working towards cessation as goal.

## 2022-04-09 NOTE — Assessment & Plan Note (Signed)
Continue collaboration with [facility] [place].  Baclofen for discomfort as needed and continue collaboration with [facility].

## 2022-04-09 NOTE — Assessment & Plan Note (Signed)
Chronic.  Have advised him to continue supplement daily.  Check level today.

## 2022-04-09 NOTE — Assessment & Plan Note (Signed)
Chronic, stable.  ?related to past heavier alcohol intake.  CBC today.  Recommend continued cessation of alcohol use.

## 2022-04-09 NOTE — Progress Notes (Signed)
BP 121/79   Pulse 70   Temp 97.8 F (36.6 C) (Oral)   Ht 6' 2.02" (1.88 m)   Wt 207 lb 9.6 oz (94.2 kg)   SpO2 98%   BMI 26.64 kg/m    Subjective:    Patient ID: Harold Tate, male    DOB: Oct 28, 1965, 57 y.o.   MRN: [id number]  HPI: Harold Tate is a 56 y.o. male  Chief Complaint  Patient presents with   Anxiety   Labwork    Would like to have his testosterone check and B-12   DIABETES Last A1c September 5.6%.  Has been off medications for some time. Hypoglycemic episodes:no Polydipsia/polyuria: no Visual disturbance: no Chest pain: no Paresthesias: no Glucose Monitoring: no  Accucheck frequency: Not Checking  Fasting glucose:  Post prandial:  Evening:  Before Tate: Taking Insulin?: no  Long acting insulin:  Short acting insulin: Blood Pressure Monitoring: not checking Retinal Examination: Not up to Date Foot Exam: Up to Date Pneumovax: Not up to Date -- refuses Influenza: Not up to Date -- refuses Aspirin: no   HYPERTENSION / HYPERLIPIDEMIA/ CIRRHOSIS No current BP medications, to be taking Rosuvastatin.  Last visit with neurology on 03/26/22 at [facility] for hereditary spastic paraparesis.  He would like Testosterone checked as is tired all of the time.  Not currently taking Rosuvastatin, have recommended he take.  Last seen on 07/24/21 by GI, ultrasound on 01/28/22 was stable.  He denies heavy alcohol use, does not currently alcohol.  Continues to smoke, smokes about 1 PPD.  Has smoked since he was 56 years old. Satisfied with current treatment? yes Duration of hyperlipidemia: chronic Cholesterol medication side effects: no Cholesterol supplements: none Medication compliance: good compliance Aspirin: no Recent stressors: no Recurrent headaches: no Visual changes: no Palpitations: no Dyspnea: no Chest pain: no Lower extremity edema: no Dizzy/lightheaded: no   ANXIETY/STRESS Continues on Clonazepam 0.5 MG TID ordered by previous PCP.  Has been on  this for several years, used to be Xanax but was changed. Pt is aware of risks of benzo medication use to include increased sedation, respiratory suppression, falls, extrapyramidal movements, dependence and cardiovascular events. Pt would like to continue treatment as benefit determined to outweigh risk. Reports in past when he tried to quit taking Xanax 1 MG TID, it became worse and then he broke out in hives.     Has tried Wellbutrin, Celexa, and Prozac in past without success. Wellbutrin caused weird thoughts.  On review of PDMP his last Klonopin fill was 03/11/22, no other recent controlled substance fills.   Duration:stable Anxious mood: yes  Excessive worrying: no Irritability: no  Sweating: no Nausea: no Palpitations:no Hyperventilation: no Panic attacks: no Agoraphobia: no  Obscessions/compulsions: no Depressed mood: no    04/09/2022   11:18 AM 11/27/2021    3:46 PM 07/03/2021   10:05 AM 03/07/2021    2:59 PM 01/09/2021    3:59 PM  Depression screen PHQ 2/9  Decreased Interest 0 0 0 0 0  Down, Depressed, Hopeless 0 0 0 0 0  PHQ - 2 Score 0 0 0 0 0  Altered sleeping 0 1 0 0 0  Tired, decreased energy 3 1 0 0 0  Change in appetite 0 0 0 0 0  Feeling bad or failure about yourself  0 0 0 0 0  Trouble concentrating 0 0 0 0 0  Moving slowly or fidgety/restless 0 0 0 0 0  Suicidal thoughts 0 0 0 0  0  PHQ-9 Score 3 2 0 0 0  Difficult doing work/chores Not difficult at all Not difficult at all Not difficult at all Not difficult at all Not difficult at all  Anhedonia: no Weight changes: no Insomnia: none Hypersomnia: no Fatigue/loss of energy: no Feelings of worthlessness: no Feelings of guilt: no Impaired concentration/indecisiveness: no Suicidal ideations: no  Crying spells: no Recent Stressors/Life Changes: no   Relationship problems: no   Family stress: no     Financial stress: no    Job stress: no    Recent death/loss: no     May 08, 2022   11:18 AM 11/27/2021    3:46  PM 07/03/2021   10:06 AM 03/07/2021    3:00 PM  GAD 7 : Generalized Anxiety Score  Nervous, Anxious, on Edge 0 3 0 1  Control/stop worrying 0 3 0 1  Worry too much - different things 0 3 0 1  Trouble relaxing 0 2 0 0  Restless 0 2 0 0  Easily annoyed or irritable 0 0 0 1  Afraid - awful might happen 0 1 0 0  Total GAD 7 Score 0 14 0 4  Anxiety Difficulty Not difficult at all Very difficult Not difficult at all Not difficult at all   Relevant past medical, surgical, family and social history reviewed and updated as indicated. Interim medical history since our last visit reviewed. Allergies and medications reviewed and updated.  Review of Systems  Constitutional:  Negative for activity change, diaphoresis, fatigue and fever.  Respiratory:  Negative for cough, chest tightness, shortness of breath and wheezing.   Cardiovascular:  Negative for chest pain, palpitations and leg swelling.  Gastrointestinal: Negative.   Endocrine: Negative for cold intolerance, heat intolerance, polydipsia, polyphagia and polyuria.  Neurological: Negative.   Psychiatric/Behavioral: Negative.     Per HPI unless specifically indicated above     Objective:    BP 121/79   Pulse 70   Temp 97.8 F (36.6 C) (Oral)   Ht 6' 2.02" (1.88 m)   Wt 207 lb 9.6 oz (94.2 kg)   SpO2 98%   BMI 26.64 kg/m   Wt Readings from Last 3 Encounters:  05-08-2022 207 lb 9.6 oz (94.2 kg)  01/07/22 210 lb 8 oz (95.5 kg)  11/27/21 205 lb 9.6 oz (93.3 kg)    Physical Exam Vitals and nursing note reviewed.  Constitutional:      General: He is awake. He is not in acute distress.    Appearance: He is well-developed and well-groomed. He is not ill-appearing or toxic-appearing.  HENT:     Head: Normocephalic.  Eyes:     General: Lids are normal.     Extraocular Movements: Extraocular movements intact.     Conjunctiva/sclera: Conjunctivae normal.  Neck:     Thyroid: No thyromegaly.     Vascular: No carotid bruit.   Cardiovascular:     Rate and Rhythm: Normal rate and regular rhythm.     Heart sounds: Normal heart sounds.  Pulmonary:     Effort: No accessory muscle usage or respiratory distress.     Breath sounds: Normal breath sounds.  Abdominal:     General: Bowel sounds are normal. There is no distension.     Palpations: Abdomen is soft.     Tenderness: There is no abdominal tenderness.  Musculoskeletal:     Cervical back: Full passive range of motion without pain.     Right lower leg: No edema.     Left  lower leg: No edema.  Lymphadenopathy:     Cervical: No cervical adenopathy.  Skin:    General: Skin is warm.     Capillary Refill: Capillary refill takes less than 2 seconds.  Neurological:     Mental Status: He is alert and oriented to person, place, and time.     Deep Tendon Reflexes: Reflexes are normal and symmetric.     Reflex Scores:      Brachioradialis reflexes are 2+ on the right side and 2+ on the left side.      Patellar reflexes are 2+ on the right side and 2+ on the left side. Psychiatric:        Attention and Perception: Attention normal.        Mood and Affect: Mood normal.        Speech: Speech normal.        Behavior: Behavior normal. Behavior is cooperative.        Thought Content: Thought content normal.    Results for orders placed or performed in visit on 11/27/21  [id number] 11+Oxyco+Alc+Crt-Bund  Result Value Ref Range   Ethanol Negative Cutoff=0.020 %   Amphetamines, Urine Negative Cutoff=1000 ng/mL   Barbiturate Negative Cutoff=200 ng/mL   BENZODIAZ UR QL Negative Cutoff=200 ng/mL   Cannabinoid Quant, Ur Negative Cutoff=50 ng/mL   Cocaine (Metabolite) Negative Cutoff=300 ng/mL   OPIATE SCREEN URINE Negative Cutoff=300 ng/mL   Oxycodone/Oxymorphone, Urine Negative Cutoff=300 ng/mL   Phencyclidine Negative Cutoff=25 ng/mL   Methadone Screen, Urine Negative Cutoff=300 ng/mL   Propoxyphene Negative Cutoff=300 ng/mL   Meperidine Negative Cutoff=200 ng/mL    Tramadol Negative Cutoff=200 ng/mL   Creatinine 84.7 20.0 - 300.0 mg/dL   pH, Urine 6.9 4.5 - 8.9      Assessment & Plan:   Problem List Items Addressed This Visit       Cardiovascular and Mediastinum   Aortic atherosclerosis (HCC)    Chronic, noted on past CT scan.  Recommend he consistently take Crestor and cut back on smoking, working towards cessation as goal.      Relevant Medications   rosuvastatin (CRESTOR) 5 MG tablet   Other Relevant Orders   Comprehensive metabolic panel   Lipid Panel w/o Chol/HDL Ratio   Hypertension associated with diabetes (HCC)    Chronic, stable = no current medications.  BP at goal in office today.  Recommend he monitor BP at least a few mornings a week at home and document.  DASH diet at home.  Continue current medication regimen and adjust as needed.  Labs today: CBC, CMP, TSH, Lipid, urine ALB.         Relevant Medications   rosuvastatin (CRESTOR) 5 MG tablet   Other Relevant Orders   TSH     Digestive   Cirrhosis of liver (HCC)    Chronic, stable.  Followed by GI with recent imaging remaining stable.  He denies alcohol use.  Continue collaboration with GI.        Endocrine   Hyperlipidemia associated with type 2 diabetes mellitus (HCC)    Chronic, ongoing.  Continue Crestor at this time, discussed at length.  Lipid panel today.      Relevant Medications   rosuvastatin (CRESTOR) 5 MG tablet   Other Relevant Orders   Comprehensive metabolic panel   Lipid Panel w/o Chol/HDL Ratio   Type 2 diabetes mellitus with obesity (HCC) - Primary    Chronic, ongoing with no medication.  Recent 5.6%.  Focus on diabetic diet at  home, discussed with him, and recommend he check blood sugar at least weekly. Return in 3 months.      Relevant Medications   rosuvastatin (CRESTOR) 5 MG tablet   Other Relevant Orders   HgB A1c   Urine Microalbumin w/creat. ratio     Nervous and Auditory   Hereditary spastic paraparesis ([place])    Continue  collaboration with [facility].  Baclofen for discomfort as needed and continue collaboration with [facility].      Relevant Orders   Comprehensive metabolic panel     Hematopoietic and Hemostatic   Thrombocytopenia (HCC)    Chronic, stable.  ?related to past heavier alcohol intake.  CBC today.  Recommend continued cessation of alcohol use.       Relevant Orders   CBC with Differential/Platelet     Other   Generalized anxiety disorder (Chronic)    Chronic, ongoing.  Has been on benzo long term started by his previous PCP.Marland Kitchen  Discussed at length risks of long term benzo use, including risk for dementia.  He is fully aware of risks and wishes to continue, refuses psychiatry visit.  Refill sent #90 plus 2 refills last visit.  Have HIGHLY recommend he not take more then ordered and that he trial a reduction of it, trial BID dosing.  Will have return to office in 3 months.  UDS up to date, contract next visit.      Relevant Medications   clonazePAM (KLONOPIN) 0.5 MG tablet   Other Relevant Orders   Testosterone, free, total([facility])   B12 deficiency    Chronic.  Have advised him to continue supplement daily.  Check level today.      Relevant Orders   Vitamin B12   CBC with Differential/Platelet   Depression, major, single episode, moderate (HCC)    Chronic, ongoing.  Denies SI/HI.   Refills sent on Klonopin, refer to anxiety plan.  Have recommended psychiatry visit, he refuses.  Has failed multiple maintenance medications.      Relevant Orders   Testosterone, free, total([facility])   Long-term current use of benzodiazepine    Chronic, ongoing.  Has been on benzo long term.  Discussed at length risks of long term benzo use.  He is aware of risks.  Refill sent #90 plus 2 refills.  UDS up to date.  Will refer to psychiatry if any increased doses requested, he refuses visit with them at this time.  Contract next visit.      Nicotine dependence, cigarettes, uncomplicated    I have  recommended complete cessation of tobacco use. I have discussed various options available for assistance with tobacco cessation including over the counter methods (Nicotine gum, patch and lozenges). We also discussed prescription options (Chantix, Nicotine Inhaler / Nasal Spray). The patient is not interested in pursuing any prescription tobacco cessation options at this time.  Recommend he return to lung cancer screening, which he refuses at this time.       Other Visit Diagnoses     Benign prostatic hyperplasia without lower urinary tract symptoms       PSA on labs today.   Relevant Orders   PSA        Follow up plan: Return in about 3 months (around 07/09/2022) for MOOD -- refills.

## 2022-04-09 NOTE — Assessment & Plan Note (Signed)
Chronic, ongoing.  Denies SI/HI.   Refills sent on Klonopin, refer to anxiety plan.  Have recommended psychiatry visit, he refuses.  Has failed multiple maintenance medications.

## 2022-04-09 NOTE — Assessment & Plan Note (Signed)
Chronic, ongoing.  Has been on benzo long term started by his previous PCP.Marland Kitchen  Discussed at length risks of long term benzo use, including risk for dementia.  He is fully aware of risks and wishes to continue, refuses psychiatry visit.  Refill sent #90 plus 2 refills last visit.  Have HIGHLY recommend he not take more then ordered and that he trial a reduction of it, trial BID dosing.  Will have return to office in 3 months.  UDS up to date, contract next visit.

## 2022-04-09 NOTE — Assessment & Plan Note (Signed)
Chronic, stable.  Followed by GI with recent imaging remaining stable.  He denies alcohol use.  Continue collaboration with GI.

## 2022-04-10 LAB — MICROALBUMIN / CREATININE URINE RATIO
Creatinine, Urine: 70.3 mg/dL
Microalb/Creat Ratio: 5 mg/g creat (ref 0–29)
Microalbumin, Urine: 3.6 ug/mL

## 2022-04-10 NOTE — Progress Notes (Signed)
Contacted via MyChart Good evening Harold Tate, your labs have returned: - Kidney function, creatinine and eGFR, remains normal, as is liver function, AST and ALT.  - Cholesterol levels show LDL above goal, please ensure you take your Rosuvastatin daily for stroke prevention. - CBC remains stable with improve platelet count this check. - A1c 5.7%, meaning your diabetes remains well-controlled without medication. - Testosterone levels are normal. - Remainder of labs are all stable.  B12 is a little on low side of normal, for energy I recommend trying to take Vitamin B12 1000 MCG daily.  Any questions? Keep being amazing!!  Thank you for allowing me to participate in your care.  I appreciate you. Kindest regards, Harold Tate

## 2022-04-12 LAB — CBC WITH DIFFERENTIAL/PLATELET
Basophils Absolute: 0.1 10*3/uL (ref 0.0–0.2)
Basos: 1 %
EOS (ABSOLUTE): 0.3 10*3/uL (ref 0.0–0.4)
Eos: 2 %
Hematocrit: 47.3 % (ref 37.5–51.0)
Hemoglobin: 15.6 g/dL (ref 13.0–17.7)
Immature Grans (Abs): 0 10*3/uL (ref 0.0–0.1)
Immature Granulocytes: 0 %
Lymphocytes Absolute: 2 10*3/uL (ref 0.7–3.1)
Lymphs: 20 %
MCH: 32.6 pg (ref 26.6–33.0)
MCHC: 33 g/dL (ref 31.5–35.7)
MCV: 99 fL — ABNORMAL HIGH (ref 79–97)
Monocytes Absolute: 0.7 10*3/uL (ref 0.1–0.9)
Monocytes: 7 %
Neutrophils Absolute: 7.2 10*3/uL — ABNORMAL HIGH (ref 1.4–7.0)
Neutrophils: 70 %
Platelets: 155 10*3/uL (ref 150–450)
RBC: 4.78 x10E6/uL (ref 4.14–5.80)
RDW: 13.1 % (ref 11.6–15.4)
WBC: 10.3 10*3/uL (ref 3.4–10.8)

## 2022-04-12 LAB — COMPREHENSIVE METABOLIC PANEL
ALT: 19 IU/L (ref 0–44)
AST: 27 IU/L (ref 0–40)
Albumin/Globulin Ratio: 1.7 (ref 1.2–2.2)
Albumin: 4.4 g/dL (ref 3.8–4.9)
Alkaline Phosphatase: 105 IU/L (ref 44–121)
BUN/Creatinine Ratio: 7 — ABNORMAL LOW (ref 9–20)
BUN: 6 mg/dL (ref 6–24)
Bilirubin Total: 0.4 mg/dL (ref 0.0–1.2)
CO2: 23 mmol/L (ref 20–29)
Calcium: 9.2 mg/dL (ref 8.7–10.2)
Chloride: 102 mmol/L (ref 96–106)
Creatinine, Ser: 0.88 mg/dL (ref 0.76–1.27)
Globulin, Total: 2.6 g/dL (ref 1.5–4.5)
Glucose: 86 mg/dL (ref 70–99)
Potassium: 4.6 mmol/L (ref 3.5–5.2)
Sodium: 138 mmol/L (ref 134–144)
Total Protein: 7 g/dL (ref 6.0–8.5)
eGFR: 101 mL/min/{1.73_m2} (ref >59)

## 2022-04-12 LAB — VITAMIN B12: Vitamin B-12: 460 pg/mL (ref 232–1245)

## 2022-04-12 LAB — LIPID PANEL W/O CHOL/HDL RATIO
Cholesterol, Total: 167 mg/dL (ref 100–199)
HDL: 41 mg/dL (ref >39)
LDL Chol Calc (NIH): 106 mg/dL — ABNORMAL HIGH (ref 0–99)
Triglycerides: 112 mg/dL (ref 0–149)
VLDL Cholesterol Cal: 20 mg/dL (ref 5–40)

## 2022-04-12 LAB — TSH: TSH: 2.3 u[IU]/mL (ref 0.450–4.500)

## 2022-04-12 LAB — HEMOGLOBIN A1C
Est. average glucose Bld gHb Est-mCnc: 117 mg/dL
Hgb A1c MFr Bld: 5.7 % — ABNORMAL HIGH (ref 4.8–5.6)

## 2022-04-12 LAB — TESTOSTERONE, FREE, TOTAL, SHBG
Sex Hormone Binding: 107 nmol/L — ABNORMAL HIGH (ref 19.3–76.4)
Testosterone, Free: 7.2 pg/mL (ref 7.2–24.0)
Testosterone: 775 ng/dL (ref 264–916)

## 2022-04-12 LAB — PSA: Prostate Specific Ag, Serum: 0.3 ng/mL (ref 0.0–4.0)

## 2022-05-03 IMAGING — CR DG ABDOMEN 1V
1 series · 3 of 3 positions shown · non-contrast
Comparison: CT 08/17/2020

CLINICAL DATA: Abdominal pain

EXAM:
ABDOMEN - 1 VIEW

[Series 1: dg abd 1 view · 0.14mm/px · 3 of 3 slices shown]
[im 1/3]
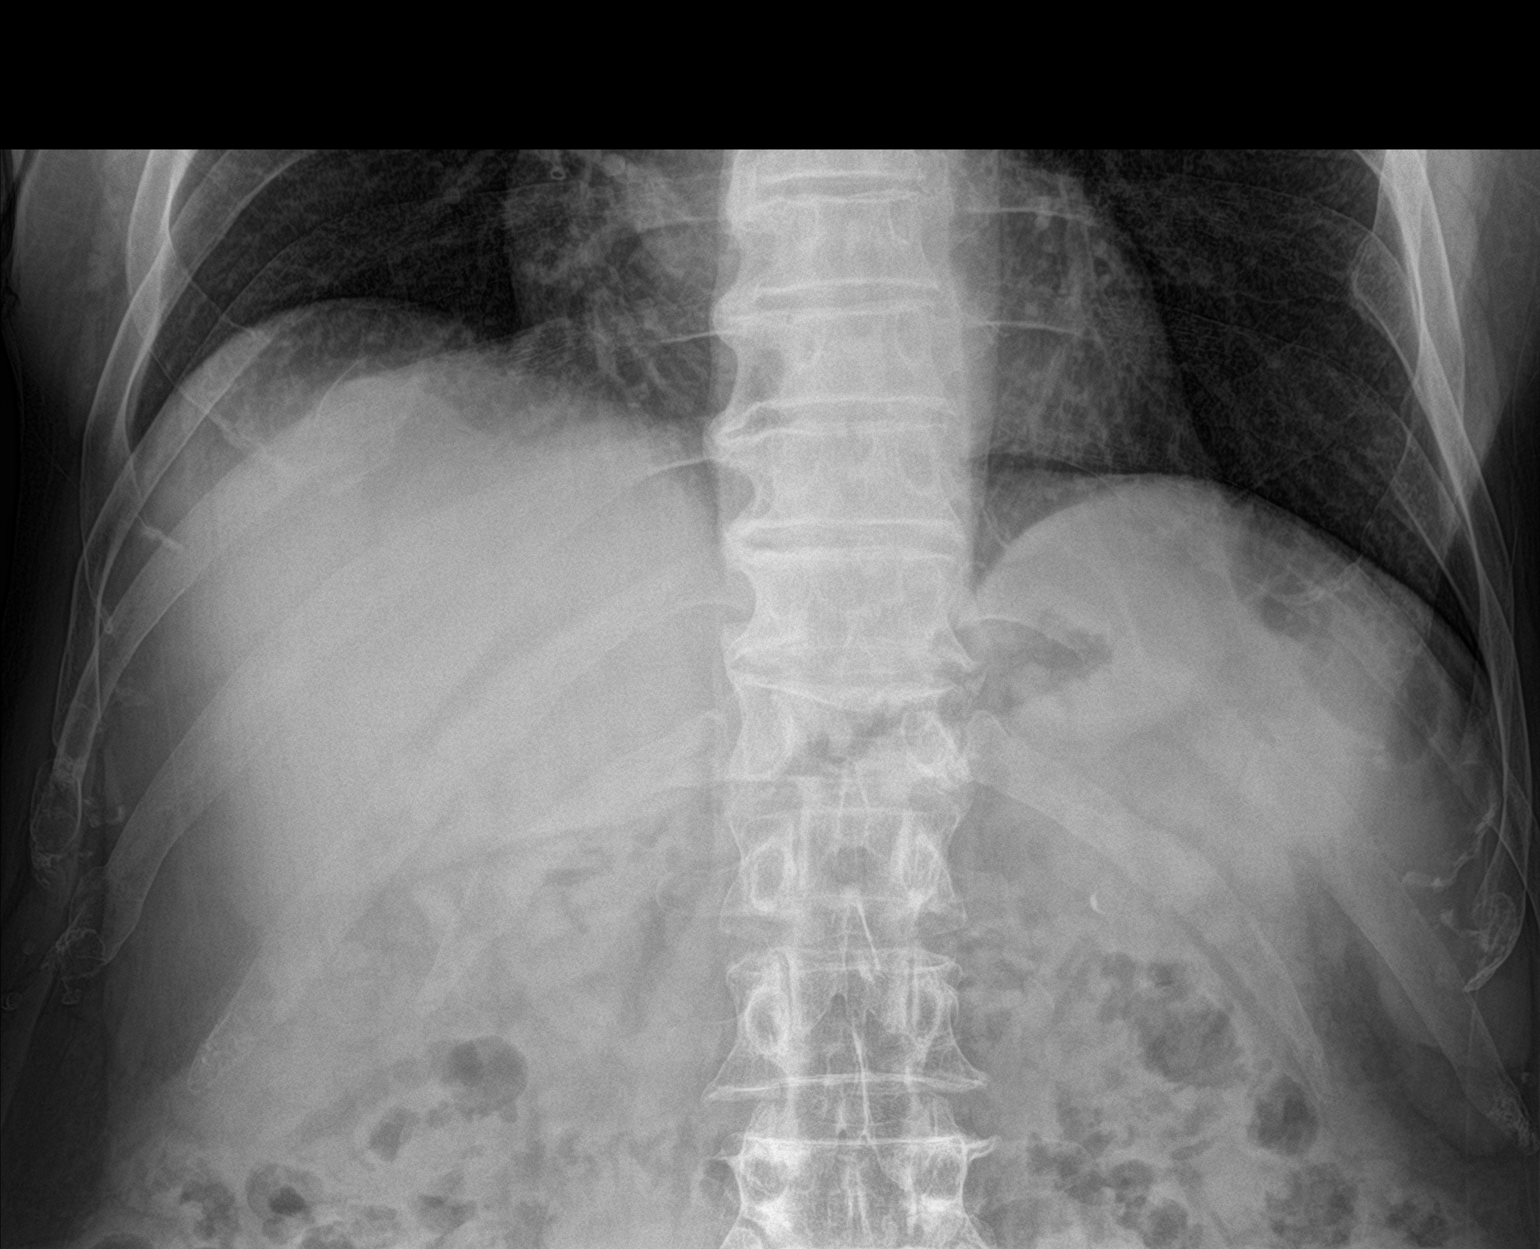
[im 2/3]
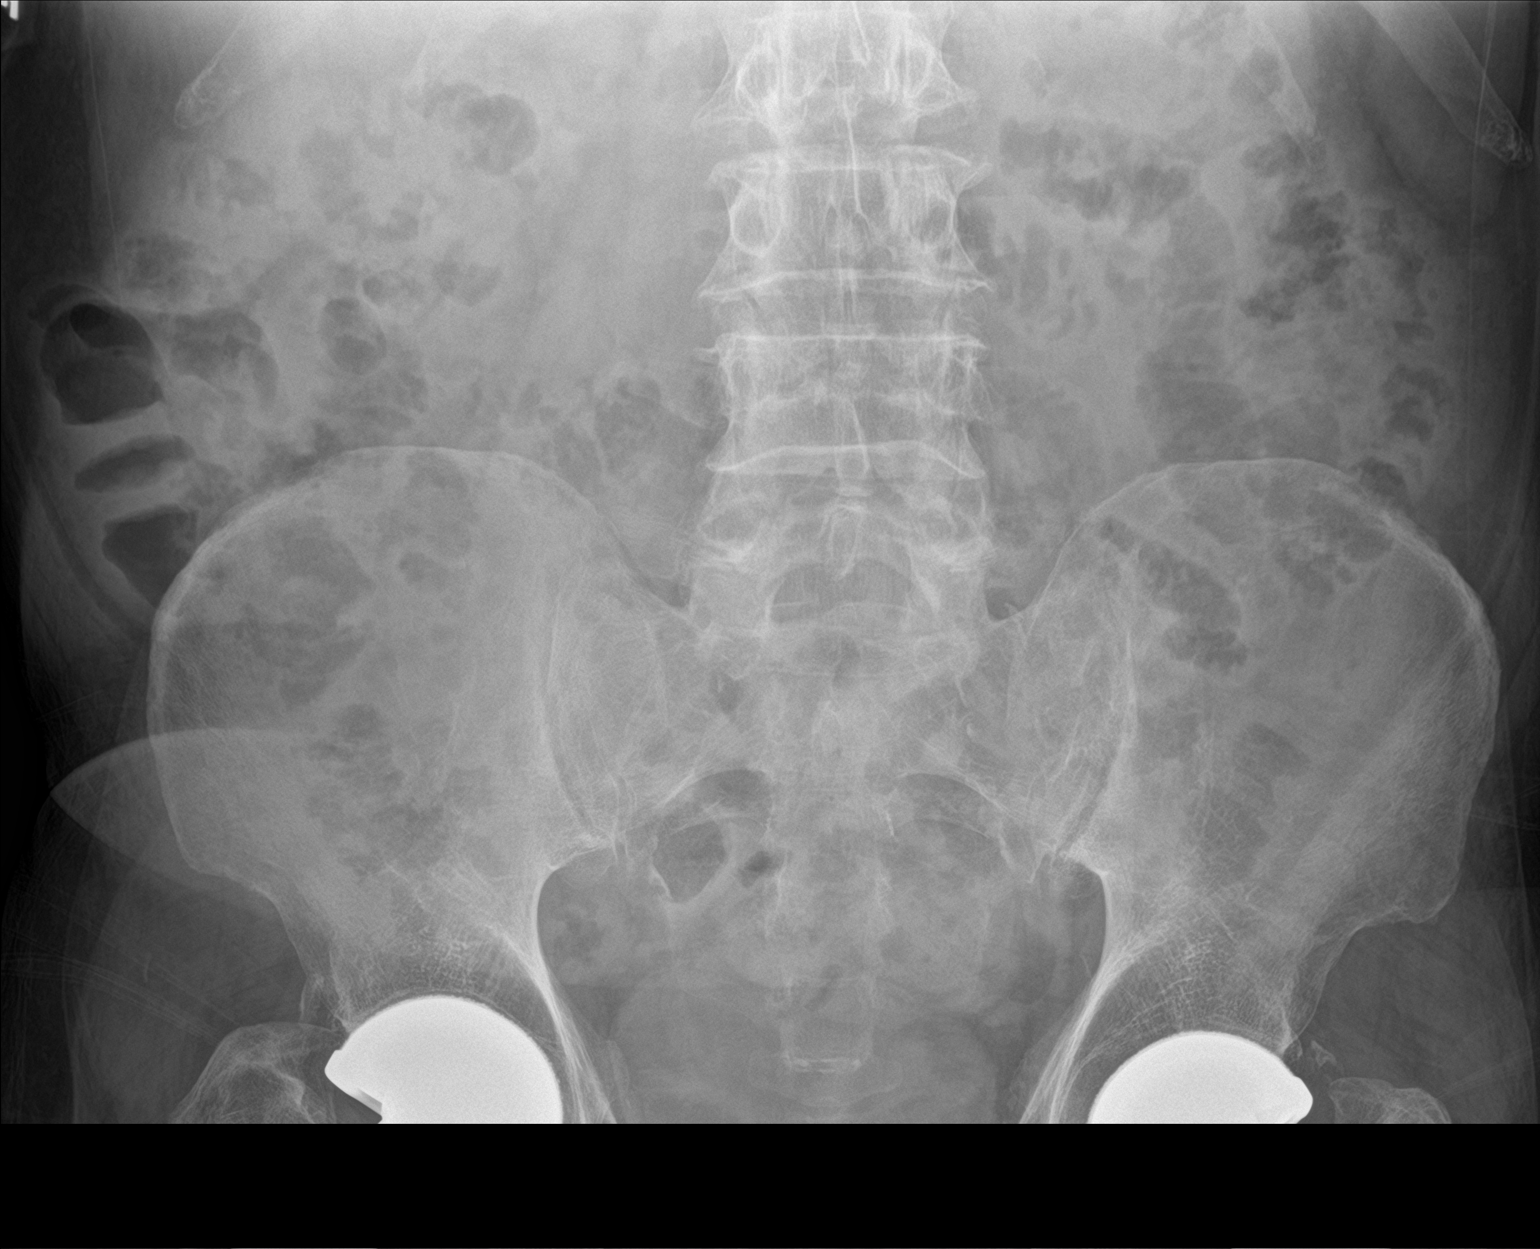
[im 3/3]
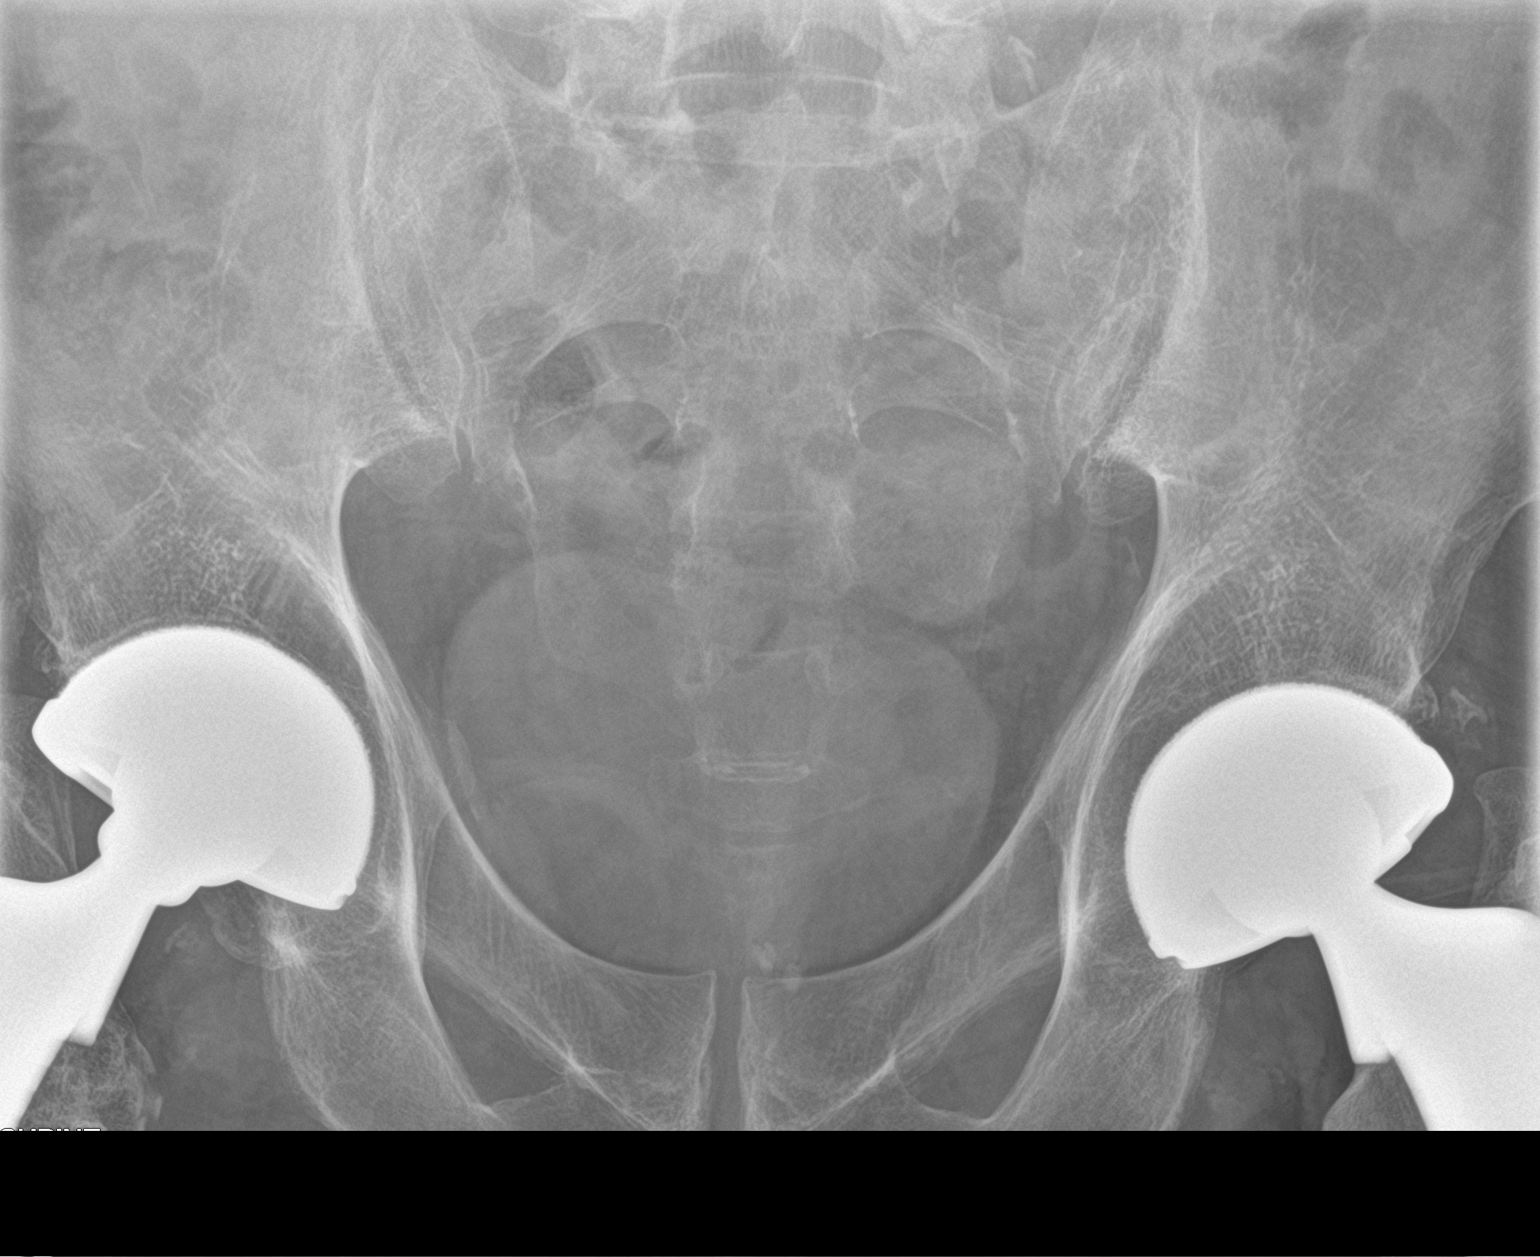

[3 of 3 positions shown; findings below may reference images not displayed]

FINDINGS: Nonobstructive bowel gas pattern. No radio-opaque calculi or other
significant radiographic abnormality are seen. Included lung bases
are clear. Acute bony findings. Bilateral hip arthroplasties.
IMPRESSION: Negative.

## 2022-07-05 ENCOUNTER — Other Ambulatory Visit: Payer: Self-pay | Admitting: Nurse Practitioner

## 2022-07-05 DIAGNOSIS — F411 Generalized anxiety disorder: Secondary | ICD-10-CM

## 2022-07-05 NOTE — Telephone Encounter (Signed)
Requested medication (s) are due for refill today: yes  Requested medication (s) are on the active medication list: yes  Last refill:  04/09/22  Future visit scheduled: yes  Notes to clinic:  Unable to refill per protocol, cannot delegate.      Requested Prescriptions  Pending Prescriptions Disp Refills   clonazePAM (KLONOPIN) 0.5 MG tablet [Pharmacy Med Name: CLONAZEPAM 0.5 MG TABLET] 90 tablet 0    Sig: Take 1 tablet(0.5 mg total) by mouth 3 (three) times daily as needed for anxiety.     Not Delegated - Psychiatry: Anxiolytics/Hypnotics 2 Failed - 07/05/2022  9:48 AM      Failed - This refill cannot be delegated      Passed - Urine Drug Screen completed in last 360 days      Passed - Patient is not pregnant      Passed - Valid encounter within last 6 months    Recent Outpatient Visits           2 months ago Type 2 diabetes mellitus with obesity ([place])   [place], [place] T, NP   5 months ago Generalized anxiety disorder   [place] [facility] Mecum, Dani Gobble, PA-C   7 months ago Generalized anxiety disorder   [place] [facility] Mecum, Erin E, PA-C   9 months ago RUQ pain   [place] [facility] Vigg, Avanti, MD   1 year ago Acute sinusitis, recurrence not specified, unspecified location   [place] Vigg, Avanti, MD       Future Appointments             In 4 days Cannady, Barbaraann Faster, NP [place], [facility]

## 2022-07-06 NOTE — Patient Instructions (Signed)

## 2022-07-09 ENCOUNTER — Ambulatory Visit: Payer: 59 | Admitting: Nurse Practitioner

## 2022-07-09 ENCOUNTER — Encounter: Payer: Self-pay | Admitting: Nurse Practitioner

## 2022-07-09 VITALS — BP 121/87 | HR 99 | Temp 97.7°F | Ht 74.02 in | Wt 211.8 lb

## 2022-07-09 DIAGNOSIS — F321 Major depressive disorder, single episode, moderate: Secondary | ICD-10-CM

## 2022-07-09 DIAGNOSIS — F411 Generalized anxiety disorder: Secondary | ICD-10-CM

## 2022-07-09 DIAGNOSIS — Z79899 Other long term (current) drug therapy: Secondary | ICD-10-CM | POA: Diagnosis not present

## 2022-07-09 MED ORDER — CLONAZEPAM 0.5 MG PO TABS: 0.5000 mg | ORAL_TABLET | Freq: Three times a day (TID) | ORAL | 1 refills | Status: DC | PRN

## 2022-07-09 NOTE — Progress Notes (Signed)
BP 121/87   Pulse 99   Temp 97.7 F (36.5 C) (Oral)   Ht 6' 2.02" (1.88 m)   Wt 211 lb 12.8 oz (96.1 kg)   SpO2 98%   BMI 27.18 kg/m    Subjective:    Patient ID: Harold Tate, male    DOB: [birth year], 57 y.o.   MRN: [id number]  HPI: Harold Tate is a 57 y.o. male  Chief Complaint  Patient presents with   Mood   ANXIETY/STRESS Continues on Clonazepam 0.5 MG TID started by previous PCP years ago.  Has been on this for several years, used to be Xanax but was changed. Pt is aware of risks of benzo medication use to include increased sedation, respiratory suppression, falls, dependence and cardiovascular events. Pt would like to continue treatment as benefit determined to outweigh risk. Reports in past when he tried to quit taking Xanax 1 MG TID, it became worse and then he broke out in hives.     Has tried Wellbutrin, Celexa, Buspar, and Prozac in past without success. Wellbutrin caused weird thoughts.  On review of PDMP his last Klonopin fill was 07/05/22, no other recent controlled substance fills.   Duration:stable Anxious mood: no  Excessive worrying: occasional Irritability: no  Sweating: no Nausea: no Palpitations:no Hyperventilation: no Panic attacks: no Agoraphobia: no  Obscessions/compulsions: no Depressed mood: no    07/09/2022   11:19 AM 04/09/2022   11:18 AM 11/27/2021    3:46 PM 07/03/2021   10:05 AM 03/07/2021    2:59 PM  Depression screen PHQ 2/9  Decreased Interest 1 0 0 0 0  Down, Depressed, Hopeless 1 0 0 0 0  PHQ - 2 Score 2 0 0 0 0  Altered sleeping 0 0 1 0 0  Tired, decreased energy '2 3 1 '$ 0 0  Change in appetite 0 0 0 0 0  Feeling bad or failure about yourself  0 0 0 0 0  Trouble concentrating 0 0 0 0 0  Moving slowly or fidgety/restless 0 0 0 0 0  Suicidal thoughts 0 0 0 0 0  PHQ-9 Score '4 3 2 '$ 0 0  Difficult doing work/chores Not difficult at all Not difficult at all Not difficult at all Not difficult at all Not difficult at all  Anhedonia:  no Weight changes: no Insomnia: none Hypersomnia: no Fatigue/loss of energy: no Feelings of worthlessness: no Feelings of guilt: no Impaired concentration/indecisiveness: no Suicidal ideations: no  Crying spells: no Recent Stressors/Life Changes: yes   Relationship problems: no   Family stress: no     Financial stress: no    Job stress: yes    Recent death/loss: no  Relevant past medical, surgical, family and social history reviewed and updated as indicated. Interim medical history since our last visit reviewed. Allergies and medications reviewed and updated.  Review of Systems  Constitutional:  Negative for activity change, diaphoresis, fatigue and fever.  Respiratory:  Negative for cough, chest tightness, shortness of breath and wheezing.   Cardiovascular:  Negative for chest pain, palpitations and leg swelling.  Gastrointestinal: Negative.   Endocrine: Negative for cold intolerance, heat intolerance, polydipsia, polyphagia and polyuria.  Neurological: Negative.   Psychiatric/Behavioral: Negative.      Per HPI unless specifically indicated above     Objective:    BP 121/87   Pulse 99   Temp 97.7 F (36.5 C) (Oral)   Ht 6' 2.02" (1.88 m)   Wt 211 lb 12.8 oz (96.1  kg)   SpO2 98%   BMI 27.18 kg/m   Wt Readings from Last 3 Encounters:  07/09/22 211 lb 12.8 oz (96.1 kg)  04/09/22 207 lb 9.6 oz (94.2 kg)  01/07/22 210 lb 8 oz (95.5 kg)    Physical Exam Vitals and nursing note reviewed.  Constitutional:      General: He is awake. He is not in acute distress.    Appearance: He is well-developed and well-groomed. He is not ill-appearing or toxic-appearing.  HENT:     Head: Normocephalic.  Eyes:     General: Lids are normal.     Extraocular Movements: Extraocular movements intact.     Conjunctiva/sclera: Conjunctivae normal.  Neck:     Thyroid: No thyromegaly.     Vascular: No carotid bruit.  Cardiovascular:     Rate and Rhythm: Normal rate and regular rhythm.      Heart sounds: Normal heart sounds.  Pulmonary:     Effort: No accessory muscle usage or respiratory distress.     Breath sounds: Normal breath sounds.  Abdominal:     General: Bowel sounds are normal. There is no distension.     Palpations: Abdomen is soft.     Tenderness: There is no abdominal tenderness.  Musculoskeletal:     Cervical back: Full passive range of motion without pain.     Right lower leg: No edema.     Left lower leg: No edema.  Lymphadenopathy:     Cervical: No cervical adenopathy.  Skin:    General: Skin is warm.     Capillary Refill: Capillary refill takes less than 2 seconds.  Neurological:     Mental Status: He is alert and oriented to person, place, and time.     Deep Tendon Reflexes: Reflexes are normal and symmetric.     Reflex Scores:      Brachioradialis reflexes are 2+ on the right side and 2+ on the left side.      Patellar reflexes are 2+ on the right side and 2+ on the left side. Psychiatric:        Attention and Perception: Attention normal.        Mood and Affect: Mood normal.        Speech: Speech normal.        Behavior: Behavior normal. Behavior is cooperative.        Thought Content: Thought content normal.     Results for orders placed or performed in visit on 04/09/22  Comprehensive metabolic panel  Result Value Ref Range   Glucose 86 70 - 99 mg/dL   BUN 6 6 - 24 mg/dL   Creatinine, Ser 0.88 0.76 - 1.27 mg/dL   eGFR 101 >59 mL/min/1.73   BUN/Creatinine Ratio 7 (L) 9 - 20   Sodium 138 134 - 144 mmol/L   Potassium 4.6 3.5 - 5.2 mmol/L   Chloride 102 96 - 106 mmol/L   CO2 23 20 - 29 mmol/L   Calcium 9.2 8.7 - 10.2 mg/dL   Total Protein 7.0 6.0 - 8.5 g/dL   Albumin 4.4 3.8 - 4.9 g/dL   Globulin, Total 2.6 1.5 - 4.5 g/dL   Albumin/Globulin Ratio 1.7 1.2 - 2.2   Bilirubin Total 0.4 0.0 - 1.2 mg/dL   Alkaline Phosphatase 105 44 - 121 IU/L   AST 27 0 - 40 IU/L   ALT 19 0 - 44 IU/L  Lipid Panel w/o Chol/HDL Ratio  Result Value  Ref Range   Cholesterol,  Total 167 100 - 199 mg/dL   Triglycerides 112 0 - 149 mg/dL   HDL 41 >39 mg/dL   VLDL Cholesterol Cal 20 5 - 40 mg/dL   LDL Chol Calc (NIH) 106 (H) 0 - 99 mg/dL  TSH  Result Value Ref Range   TSH 2.300 0.450 - 4.500 uIU/mL  PSA  Result Value Ref Range   Prostate Specific Ag, Serum 0.3 0.0 - 4.0 ng/mL  Vitamin B12  Result Value Ref Range   Vitamin B-12 460 232 - 1,245 pg/mL  CBC with Differential/Platelet  Result Value Ref Range   WBC 10.3 3.4 - 10.8 x10E3/uL   RBC 4.78 4.14 - 5.80 x10E6/uL   Hemoglobin 15.6 13.0 - 17.7 g/dL   Hematocrit 47.3 37.5 - 51.0 %   MCV 99 (H) 79 - 97 fL   MCH 32.6 26.6 - 33.0 pg   MCHC 33.0 31.5 - 35.7 g/dL   RDW 13.1 11.6 - 15.4 %   Platelets 155 150 - 450 x10E3/uL   Neutrophils 70 Not Estab. %   Lymphs 20 Not Estab. %   Monocytes 7 Not Estab. %   Eos 2 Not Estab. %   Basos 1 Not Estab. %   Neutrophils Absolute 7.2 (H) 1.4 - 7.0 x10E3/uL   Lymphocytes Absolute 2.0 0.7 - 3.1 x10E3/uL   Monocytes Absolute 0.7 0.1 - 0.9 x10E3/uL   EOS (ABSOLUTE) 0.3 0.0 - 0.4 x10E3/uL   Basophils Absolute 0.1 0.0 - 0.2 x10E3/uL   Immature Granulocytes 0 Not Estab. %   Immature Grans (Abs) 0.0 0.0 - 0.1 x10E3/uL  HgB A1c  Result Value Ref Range   Hgb A1c MFr Bld 5.7 (H) 4.8 - 5.6 %   Est. average glucose Bld gHb Est-mCnc 117 mg/dL  Urine Microalbumin w/creat. ratio  Result Value Ref Range   Creatinine, Urine 70.3 Not Estab. mg/dL   Microalbumin, Urine 3.6 Not Estab. ug/mL   Microalb/Creat Ratio 5 0 - 29 mg/g creat  Testosterone, free, total([facility])  Result Value Ref Range   Testosterone 775 264 - 916 ng/dL   Testosterone, Free 7.2 7.2 - 24.0 pg/mL   Sex Hormone Binding 107.0 (H) 19.3 - 76.4 nmol/L      Assessment & Plan:   Problem List Items Addressed This Visit       Other   Generalized anxiety disorder (Chronic)    Chronic, ongoing.  Has been on benzo long term started by his previous PCP.  Discussed at length  risks of long term benzo use, including risk for dementia.  He is fully aware of risks and wishes to continue, refuses psychiatry visit.  Refill sent #90 plus1 refills.  Have HIGHLY recommend he not take more then ordered and that he trial a reduction of it, trial BID dosing.  Will have return to office in 3 months.  UDS up to date (due 11/28/22), contract obtained today.      Relevant Medications   clonazePAM (KLONOPIN) 0.5 MG tablet (Start on 08/02/2022)   Controlled substance agreement signed    Signed with patient today on 07/09/22 -- discussed and educated on contract.      Depression, major, single episode, moderate (HCC) - Primary    Chronic, ongoing.  Denies SI/HI.   Refills sent on Klonopin, refer to anxiety plan.  Have recommended psychiatry visit, he refuses.  Has failed multiple maintenance medications.        Follow up plan: Return in about 3 months (around 10/09/2022) for T2DM, HTN/HLD, MOOD.

## 2022-07-09 NOTE — Assessment & Plan Note (Signed)
Chronic, ongoing.  Has been on benzo long term started by his previous PCP.  Discussed at length risks of long term benzo use, including risk for dementia.  He is fully aware of risks and wishes to continue, refuses psychiatry visit.  Refill sent #90 plus1 refills.  Have HIGHLY recommend he not take more then ordered and that he trial a reduction of it, trial BID dosing.  Will have return to office in 3 months.  UDS up to date (due 11/28/22), contract obtained today.

## 2022-07-09 NOTE — Assessment & Plan Note (Signed)
Chronic, ongoing.  Denies SI/HI.   Refills sent on Klonopin, refer to anxiety plan.  Have recommended psychiatry visit, he refuses.  Has failed multiple maintenance medications. 

## 2022-07-09 NOTE — Assessment & Plan Note (Signed)
Signed with patient today on 07/09/22 -- discussed and educated on contract.

## 2022-07-18 ENCOUNTER — Encounter: Payer: Self-pay | Admitting: Gastroenterology

## 2022-07-18 DIAGNOSIS — K7460 Unspecified cirrhosis of liver: Secondary | ICD-10-CM

## 2022-09-11 ENCOUNTER — Telehealth: Payer: Self-pay

## 2022-09-11 NOTE — Telephone Encounter (Signed)
[place] scheduled for 09/16/22 arrive at 9:45am... Nothing to eat or drink after 12 midnight the day before

## 2022-09-11 NOTE — Telephone Encounter (Signed)
Left message on voicemail.

## 2022-09-11 NOTE — Telephone Encounter (Signed)
-----   Message from Roena Malady, [place] sent at 07/31/2022  5:42 PM EDT ----- Regarding: FW: 6 mth [place] and AFP  ----- Message ----- From: Roena Malady, CMA Sent: 07/08/2022   8:30 AM EDT To: Roena Malady, CMA Subject: 6 mth [place] and AFP                               6 mth [place] and AFP after 07/30/22

## 2022-09-16 ENCOUNTER — Ambulatory Visit
Admission: RE | Admit: 2022-09-16 | Discharge: 2022-09-16 | Disposition: A | Discharge status: Home / Self Care | Payer: 59 | Source: Ambulatory Visit | Attending: Gastroenterology | Admitting: Gastroenterology

## 2022-09-16 DIAGNOSIS — K7460 Unspecified cirrhosis of liver: Secondary | ICD-10-CM | POA: Diagnosis not present

## 2022-09-27 DIAGNOSIS — G4733 Obstructive sleep apnea (adult) (pediatric): Secondary | ICD-10-CM | POA: Diagnosis not present

## 2022-09-27 DIAGNOSIS — G629 Polyneuropathy, unspecified: Secondary | ICD-10-CM | POA: Diagnosis not present

## 2022-09-27 DIAGNOSIS — Z5181 Encounter for therapeutic drug level monitoring: Secondary | ICD-10-CM | POA: Diagnosis not present

## 2022-09-27 DIAGNOSIS — G114 Hereditary spastic paraplegia: Secondary | ICD-10-CM | POA: Diagnosis not present

## 2022-09-27 DIAGNOSIS — R5382 Chronic fatigue, unspecified: Secondary | ICD-10-CM | POA: Diagnosis not present

## 2022-10-03 ENCOUNTER — Other Ambulatory Visit: Payer: Self-pay | Admitting: Nurse Practitioner

## 2022-10-03 DIAGNOSIS — F411 Generalized anxiety disorder: Secondary | ICD-10-CM

## 2022-10-03 NOTE — Telephone Encounter (Signed)
Requested medication (s) are due for refill today - yes  Requested medication (s) are on the active medication list -yes  Future visit scheduled -yes  Last refill: 08/02/22 #90 1RF  Notes to clinic: non delegated Rx  Requested Prescriptions  Pending Prescriptions Disp Refills   clonazePAM (KLONOPIN) 0.5 MG tablet [Pharmacy Med Name: CLONAZEPAM 0.5 MG TABLET] 90 tablet 0    Sig: Take 1 tablet (0.5 mg total) by mouth 3 (three) times daily as needed for anxiety.     Not Delegated - Psychiatry: Anxiolytics/Hypnotics 2 Failed - 10/03/2022 11:29 AM      Failed - This refill cannot be delegated      Passed - Urine Drug Screen completed in last 360 days      Passed - Patient is not pregnant      Passed - Valid encounter within last 6 months    Recent Outpatient Visits           2 months ago Depression, major, single episode, moderate (HCC)   [place] [facility] [place], [place] T, NP   5 months ago Type 2 diabetes mellitus with obesity (HCC)   [place] [facility] [place], Corrie Dandy T, NP   8 months ago Generalized anxiety disorder   [place] [facility] Mecum, Oswaldo Conroy, PA-C   10 months ago Generalized anxiety disorder   [place] [facility] Mecum, Oswaldo Conroy, PA-C   1 year ago RUQ pain   [place] [facility] Vigg, Avanti, MD       Future Appointments             In 6 days [place], [place] T, NP [place] [facility]               Requested Prescriptions  Pending Prescriptions Disp Refills   clonazePAM (KLONOPIN) 0.5 MG tablet [Pharmacy Med Name: CLONAZEPAM 0.5 MG TABLET] 90 tablet 0    Sig: Take 1 tablet (0.5 mg total) by mouth 3 (three) times daily as needed for anxiety.     Not Delegated - Psychiatry: Anxiolytics/Hypnotics 2 Failed - 10/03/2022 11:29 AM      Failed - This refill cannot be delegated      Passed - Urine Drug Screen completed in last 360 days      Passed -  Patient is not pregnant      Passed - Valid encounter within last 6 months    Recent Outpatient Visits           2 months ago Depression, major, single episode, moderate (HCC)   [place] [facility] [place], [place] T, NP   5 months ago Type 2 diabetes mellitus with obesity (HCC)   [place] [facility] [place], Corrie Dandy T, NP   8 months ago Generalized anxiety disorder   [place] [facility] Mecum, Erin E, PA-C   10 months ago Generalized anxiety disorder   [place] [facility] Mecum, Oswaldo Conroy, PA-C   1 year ago RUQ pain   [place] [facility] Vigg, Avanti, MD       Future Appointments             In 6 days [place], Dorie Rank, NP [place] [facility]

## 2022-10-06 NOTE — Patient Instructions (Signed)

## 2022-10-09 ENCOUNTER — Encounter: Payer: Self-pay | Admitting: Nurse Practitioner

## 2022-10-09 ENCOUNTER — Ambulatory Visit (INDEPENDENT_AMBULATORY_CARE_PROVIDER_SITE_OTHER): Payer: 59 | Admitting: Nurse Practitioner

## 2022-10-09 VITALS — BP 135/81 | HR 79 | Temp 97.5°F | Ht 74.02 in | Wt 219.4 lb

## 2022-10-09 DIAGNOSIS — D696 Thrombocytopenia, unspecified: Secondary | ICD-10-CM

## 2022-10-09 DIAGNOSIS — E1169 Type 2 diabetes mellitus with other specified complication: Secondary | ICD-10-CM | POA: Diagnosis not present

## 2022-10-09 DIAGNOSIS — F17210 Nicotine dependence, cigarettes, uncomplicated: Secondary | ICD-10-CM

## 2022-10-09 DIAGNOSIS — M25511 Pain in right shoulder: Secondary | ICD-10-CM

## 2022-10-09 DIAGNOSIS — Z79899 Other long term (current) drug therapy: Secondary | ICD-10-CM | POA: Diagnosis not present

## 2022-10-09 DIAGNOSIS — F321 Major depressive disorder, single episode, moderate: Secondary | ICD-10-CM

## 2022-10-09 DIAGNOSIS — G114 Hereditary spastic paraplegia: Secondary | ICD-10-CM | POA: Diagnosis not present

## 2022-10-09 DIAGNOSIS — Z114 Encounter for screening for human immunodeficiency virus [HIV]: Secondary | ICD-10-CM

## 2022-10-09 DIAGNOSIS — I700 Atherosclerosis of aorta: Secondary | ICD-10-CM

## 2022-10-09 DIAGNOSIS — K7469 Other cirrhosis of liver: Secondary | ICD-10-CM

## 2022-10-09 DIAGNOSIS — I152 Hypertension secondary to endocrine disorders: Secondary | ICD-10-CM | POA: Diagnosis not present

## 2022-10-09 DIAGNOSIS — E785 Hyperlipidemia, unspecified: Secondary | ICD-10-CM | POA: Diagnosis not present

## 2022-10-09 DIAGNOSIS — E1159 Type 2 diabetes mellitus with other circulatory complications: Secondary | ICD-10-CM

## 2022-10-09 DIAGNOSIS — E669 Obesity, unspecified: Secondary | ICD-10-CM | POA: Diagnosis not present

## 2022-10-09 DIAGNOSIS — F411 Generalized anxiety disorder: Secondary | ICD-10-CM | POA: Diagnosis not present

## 2022-10-09 LAB — MICROALBUMIN, URINE WAIVED
Creatinine, Urine Waived: 200 mg/dL (ref 10–300)
Microalb, Ur Waived: 30 mg/L — ABNORMAL HIGH (ref 0–19)
Microalb/Creat Ratio: <30 mg/g (ref <30)

## 2022-10-09 LAB — BAYER DCA HB A1C WAIVED: HB A1C (BAYER DCA - WAIVED): 6.7 % — ABNORMAL HIGH (ref 4.8–5.6)

## 2022-10-09 MED ORDER — CLONAZEPAM 0.5 MG PO TABS
0.5000 mg | ORAL_TABLET | Freq: Three times a day (TID) | ORAL | 2 refills | Status: DC | PRN
Start: 2022-11-02 — End: 2023-01-09

## 2022-10-09 NOTE — Assessment & Plan Note (Signed)
Chronic, stable.  Followed by GI with recent imaging remaining stable.  He denies alcohol use.  Continue collaboration with GI. 

## 2022-10-09 NOTE — Assessment & Plan Note (Addendum)
Chronic, ongoing.  Has been on benzo long term.  Discussed at length risks of long term benzo use.  He is aware of risks.  Refill sent #90 plus 2 refills.  UDS up to date.  Will refer to psychiatry if any increased doses requested, he refuses visit with them at this time.  Contract up to date and UDS due August 2024.

## 2022-10-09 NOTE — Assessment & Plan Note (Signed)
Chronic, stable.  ?related to past heavier alcohol intake.  CBC today.  Recommend continued cessation of alcohol use.  

## 2022-10-09 NOTE — Assessment & Plan Note (Addendum)
Chronic, stable without medications.  BP at goal in office today.  Recommend he monitor BP at least a few mornings a week at home and document.  DASH diet at home.  Continue current medication regimen and adjust as needed.  Urine ALB 27 October 2022. Labs today: CMP and Lipid.

## 2022-10-09 NOTE — Assessment & Plan Note (Signed)
I have recommended complete cessation of tobacco use. I have discussed various options available for assistance with tobacco cessation including over the counter methods (Nicotine gum, patch and lozenges). We also discussed prescription options (Chantix, Nicotine Inhaler / Nasal Spray). The patient is not interested in pursuing any prescription tobacco cessation options at this time.  Recommend he return to lung cancer screening, referral placed today.

## 2022-10-09 NOTE — Assessment & Plan Note (Signed)
Chronic, ongoing.  Continue Crestor at this time, discussed at length.  Lipid panel today. 

## 2022-10-09 NOTE — Assessment & Plan Note (Signed)
Chronic, ongoing.  Denies SI/HI.   Refills sent on Klonopin, refer to anxiety plan.  Have recommended psychiatry visit, he refuses.  Has failed multiple maintenance medications. 

## 2022-10-09 NOTE — Assessment & Plan Note (Signed)
Chronic, ongoing.  Has been on benzo long term started by his previous PCP.  Discussed at length risks of long term benzo use, including risk for dementia.  He is fully aware of risks and wishes to continue, refuses psychiatry visit.  Refill sent #90 plus 2 refills.  Have HIGHLY recommend he not take more then ordered and that he trial a reduction of it, trial BID dosing.  Will have return to office in 3 months.  UDS up to date (due 11/28/22), contract up to date.

## 2022-10-09 NOTE — Assessment & Plan Note (Signed)
Chronic, progressive.  Continue collaboration with [facility].  Baclofen for discomfort as needed. Recommend referral to PT, placed this visit, to work on strengthening.  Will place referral to SW as well due to concerns for finances, as unable to work as once did.

## 2022-10-09 NOTE — Assessment & Plan Note (Signed)
Chronic, ongoing with no medication.  A1c trend up to 6.7% and urine ALB 27 October 2022.  Focus on diabetic diet at home, discussed with him, and recommend he check blood sugar at least weekly. Return in 3 months. Restart medication as needed in future.

## 2022-10-09 NOTE — Assessment & Plan Note (Signed)
Chronic, noted on past CT scan.  Recommend he consistently take Crestor and cut back on smoking, working towards cessation as goal. 

## 2022-10-09 NOTE — Progress Notes (Signed)
Contacted via MyChart   Good evening Harold Tate, your A1c did creep up a little to 6.7%.  You had been 5.6% last check and out of diabetes range, but it has traveled back to diabetic level. Please focus heavily on diet reducing sugar intake and carbohydrates.  Any questions?

## 2022-10-09 NOTE — Assessment & Plan Note (Signed)
Acute for 3 weeks with underlying hereditary spastic paraplegia.  Recommend he continue Baclofen as needed + use OTC Voltaren gel + heat as needed.  Referral to physical therapy placed.

## 2022-10-09 NOTE — Progress Notes (Signed)
BP 135/81   Pulse 79   Temp (!) 97.5 F (36.4 C) (Oral)   Ht 6' 2.02" (1.88 m)   Wt 219 lb 6.4 oz (99.5 kg)   SpO2 95%   BMI 28.16 kg/m    Subjective:    Patient ID: Harold Tate, male    DOB: 1965-05-17, 57 y.o.   MRN: [id number]  HPI: Harold Tate is a 57 y.o. male  Chief Complaint  Patient presents with   Shoulder Pain    Right shoulder pain that radiates to the neck started a few weeks ago   DIABETES Last A1c December 5.7%.  Has been off medications >1 year. Hypoglycemic episodes:no Polydipsia/polyuria: no Visual disturbance: no Chest pain: no Paresthesias: no Glucose Monitoring: no  Accucheck frequency: Not Checking  Fasting glucose:  Post prandial:  Evening:  Before meals: Taking Insulin?: no  Long acting insulin:  Short acting insulin: Blood Pressure Monitoring: not checking Retinal Examination: Not up to Date Foot Exam: Up to Date Pneumovax: Not up to Date -- refuses Influenza: Not up to Date -- refuses Aspirin: no   HYPERTENSION / HYPERLIPIDEMIA/ CIRRHOSIS No current BP medications, to be taking Rosuvastatin (which he does not do consistently).  Last visit with [facility] neurology for hereditary spastic paraparesis neurology on 09/27/22 -- they ordered a sleep study & they are concerned that Baclofen and Klonopin together are causing increased fatigue. Aortic atherosclerosis noted on imaging 12/27/20.  Last seen on 07/24/21 by GI, ultrasound on 09/16/22 was stable - to repeat in 6 months. Denies heavy alcohol use, does not currently use any alcohol.  Continues to smoke, smokes about 1 PPD.  Has smoked since he was 57 years old. Satisfied with current treatment? yes Duration of hyperlipidemia: chronic Cholesterol medication side effects: no Cholesterol supplements: none Medication compliance: good compliance Aspirin: no Recent stressors: no Recurrent headaches: no Visual changes: no Palpitations: no Dyspnea: no Chest pain: no Lower extremity edema:  no Dizzy/lightheaded: no   SHOULDER PAIN Started with neck pain about 3 weeks ago, that radiates down into right arm. Duration: weeks Involved shoulder: right Mechanism of injury: unknown Location: posterior Onset:gradual Severity: 5/10  Quality:  dull, aching, and throbbing Frequency: constant Radiation: yes Aggravating factors: lifting and movement  Alleviating factors: rest  Status: fluctuating Treatments attempted: rest  Relief with NSAIDs?:  No NSAIDs Taken Weakness: no Numbness: no Decreased grip strength: no Redness: no Swelling: no Bruising: no Fevers: no   ANXIETY/STRESS Continues on Clonazepam 0.5 MG TID ordered by previous PCP, Dr. Dossie Arbour.  Has been on this for several years, used to be Xanax but was changed. Pt is aware of risks of benzo medication use to include increased sedation, respiratory suppression, falls, extrapyramidal movements, dependence and cardiovascular events. Pt would like to continue treatment as benefit determined to outweigh risk. In past when he tried to quit taking Xanax 1 MG TID, it became worse and then he broke out in hives.     Has tried Wellbutrin, Celexa, and Prozac in past without success. Wellbutrin caused weird thoughts.  Review of PDMP his last Klonopin fill was 10/04/22, no other recent controlled substance fills.   Duration:stable Anxious mood: yes  Excessive worrying: no Irritability: no  Sweating: no Nausea: no Palpitations:no Hyperventilation: no Panic attacks: no Agoraphobia: no  Obscessions/compulsions: no Depressed mood: no    10/09/2022    8:59 AM 07/09/2022   11:19 AM 04/09/2022   11:18 AM 11/27/2021    3:46 PM 07/03/2021  10:05 AM  Depression screen PHQ 2/9  Decreased Interest 0 1 0 0 0  Down, Depressed, Hopeless 0 1 0 0 0  PHQ - 2 Score 0 2 0 0 0  Altered sleeping 0 0 0 1 0  Tired, decreased energy 0 2 3 1  0  Change in appetite 0 0 0 0 0  Feeling bad or failure about yourself  0 0 0 0 0  Trouble  concentrating 0 0 0 0 0  Moving slowly or fidgety/restless 0 0 0 0 0  Suicidal thoughts 0 0 0 0 0  PHQ-9 Score 0 4 3 2  0  Difficult doing work/chores  Not difficult at all Not difficult at all Not difficult at all Not difficult at all  Anhedonia: no Weight changes: no Insomnia: none Hypersomnia: no Fatigue/loss of energy: no Feelings of worthlessness: no Feelings of guilt: no Impaired concentration/indecisiveness: no Suicidal ideations: no  Crying spells: no Recent Stressors/Life Changes: no   Relationship problems: no   Family stress: no     Financial stress: no    Job stress: no    Recent death/loss: no     07-21-22   11:19 AM 04/09/2022   11:18 AM 11/27/2021    3:46 PM 07/03/2021   10:06 AM  GAD 7 : Generalized Anxiety Score  Nervous, Anxious, on Edge 3 0 3 0  Control/stop worrying 2 0 3 0  Worry too much - different things 1 0 3 0  Trouble relaxing 0 0 2 0  Restless 0 0 2 0  Easily annoyed or irritable 0 0 0 0  Afraid - awful might happen 0 0 1 0  Total GAD 7 Score 6 0 14 0  Anxiety Difficulty Not difficult at all Not difficult at all Very difficult Not difficult at all   Relevant past medical, surgical, family and social history reviewed and updated as indicated. Interim medical history since our last visit reviewed. Allergies and medications reviewed and updated.  Review of Systems  Constitutional:  Negative for activity change, diaphoresis, fatigue and fever.  Respiratory:  Negative for cough, chest tightness, shortness of breath and wheezing.   Cardiovascular:  Negative for chest pain, palpitations and leg swelling.  Gastrointestinal: Negative.   Endocrine: Negative for cold intolerance, heat intolerance, polydipsia, polyphagia and polyuria.  Neurological: Negative.   Psychiatric/Behavioral: Negative.     Per HPI unless specifically indicated above     Objective:    BP 135/81   Pulse 79   Temp (!) 97.5 F (36.4 C) (Oral)   Ht 6' 2.02" (1.88 m)   Wt 219  lb 6.4 oz (99.5 kg)   SpO2 95%   BMI 28.16 kg/m   Wt Readings from Last 3 Encounters:  10/09/22 219 lb 6.4 oz (99.5 kg)  07/21/22 211 lb 12.8 oz (96.1 kg)  04/09/22 207 lb 9.6 oz (94.2 kg)    Physical Exam Vitals and nursing note reviewed.  Constitutional:      General: He is awake. He is not in acute distress.    Appearance: He is well-developed and well-groomed. He is not ill-appearing or toxic-appearing.  HENT:     Head: Normocephalic.  Eyes:     General: Lids are normal.     Extraocular Movements: Extraocular movements intact.     Conjunctiva/sclera: Conjunctivae normal.  Neck:     Thyroid: No thyromegaly.     Vascular: No carotid bruit.  Cardiovascular:     Rate and Rhythm: Normal rate and regular  rhythm.     Heart sounds: Normal heart sounds.  Pulmonary:     Effort: No accessory muscle usage or respiratory distress.     Breath sounds: Normal breath sounds.  Abdominal:     General: Bowel sounds are normal. There is no distension.     Palpations: Abdomen is soft.     Tenderness: There is no abdominal tenderness.  Musculoskeletal:     Right shoulder: Tenderness (to lower neck and along scalpula) present. No swelling, laceration, bony tenderness or crepitus. Normal range of motion. Normal strength.     Left shoulder: Normal.     Cervical back: Full passive range of motion without pain.     Right lower leg: No edema.     Left lower leg: No edema.  Lymphadenopathy:     Cervical: No cervical adenopathy.  Skin:    General: Skin is warm.     Capillary Refill: Capillary refill takes less than 2 seconds.  Neurological:     Mental Status: He is alert and oriented to person, place, and time.     Deep Tendon Reflexes: Reflexes are normal and symmetric.     Reflex Scores:      Brachioradialis reflexes are 2+ on the right side and 2+ on the left side.      Patellar reflexes are 2+ on the right side and 2+ on the left side. Psychiatric:        Attention and Perception:  Attention normal.        Mood and Affect: Mood normal.        Speech: Speech normal.        Behavior: Behavior normal. Behavior is cooperative.        Thought Content: Thought content normal.    Results for orders placed or performed in visit on 10/09/22  Bayer DCA Hb A1c Waived  Result Value Ref Range   HB A1C (BAYER DCA - WAIVED) 6.7 (H) 4.8 - 5.6 %  Microalbumin, Urine Waived  Result Value Ref Range   Microalb, Ur Waived 30 (H) 0 - 19 mg/L   Creatinine, Urine Waived 200 10 - 300 mg/dL   Microalb/Creat Ratio <30 <30 mg/g      Assessment & Plan:   Problem List Items Addressed This Visit       Cardiovascular and Mediastinum   Aortic atherosclerosis (HCC)    Chronic, noted on past CT scan.  Recommend he consistently take Crestor and cut back on smoking, working towards cessation as goal.      Relevant Orders   Comprehensive metabolic panel   Lipid Panel w/o Chol/HDL Ratio   Hypertension associated with diabetes (HCC)    Chronic, stable without medications.  BP at goal in office today.  Recommend he monitor BP at least a few mornings a week at home and document.  DASH diet at home.  Continue current medication regimen and adjust as needed.  Urine ALB 27 October 2022. Labs today: CMP and Lipid.         Relevant Orders   Bayer DCA Hb A1c Waived (Completed)   Microalbumin, Urine Waived (Completed)   Comprehensive metabolic panel   Ambulatory referral to Ophthalmology     Digestive   Cirrhosis of liver (HCC)    Chronic, stable.  Followed by GI with recent imaging remaining stable.  He denies alcohol use.  Continue collaboration with GI.      Relevant Orders   Comprehensive metabolic panel   CBC with Differential/Platelet  Ambulatory referral to Social Work     Endocrine   Hyperlipidemia associated with type 2 diabetes mellitus (HCC)    Chronic, ongoing.  Continue Crestor at this time, discussed at length.  Lipid panel today.      Relevant Orders   Bayer DCA Hb A1c  Waived (Completed)   Comprehensive metabolic panel   Lipid Panel w/o Chol/HDL Ratio   Ambulatory referral to Ophthalmology   Type 2 diabetes mellitus with obesity (HCC) - Primary    Chronic, ongoing with no medication.  A1c trend up to 6.7% and urine ALB 27 October 2022.  Focus on diabetic diet at home, discussed with him, and recommend he check blood sugar at least weekly. Return in 3 months. Restart medication as needed in future.      Relevant Orders   Bayer DCA Hb A1c Waived (Completed)   Microalbumin, Urine Waived (Completed)   Ambulatory referral to Ophthalmology     Nervous and Auditory   Hereditary spastic paraparesis (HCC)    Chronic, progressive.  Continue collaboration with [facility].  Baclofen for discomfort as needed. Recommend referral to PT, placed this visit, to work on strengthening.  Will place referral to SW as well due to concerns for finances, as unable to work as once did.      Relevant Orders   Ambulatory referral to Ophthalmology   Ambulatory referral to Physical Therapy   Ambulatory referral to Social Work     Hematopoietic and Hemostatic   Thrombocytopenia (HCC)    Chronic, stable.  ?related to past heavier alcohol intake.  CBC today.  Recommend continued cessation of alcohol use.       Relevant Orders   CBC with Differential/Platelet     Other   Generalized anxiety disorder (Chronic)    Chronic, ongoing.  Has been on benzo long term started by his previous PCP.  Discussed at length risks of long term benzo use, including risk for dementia.  He is fully aware of risks and wishes to continue, refuses psychiatry visit.  Refill sent #90 plus 2 refills.  Have HIGHLY recommend he not take more then ordered and that he trial a reduction of it, trial BID dosing.  Will have return to office in 3 months.  UDS up to date (due 11/28/22), contract up to date.      Relevant Medications   clonazePAM (KLONOPIN) 0.5 MG tablet (Start on 11/02/2022)   Depression, major, single  episode, moderate (HCC)    Chronic, ongoing.  Denies SI/HI.   Refills sent on Klonopin, refer to anxiety plan.  Have recommended psychiatry visit, he refuses.  Has failed multiple maintenance medications.      Long-term current use of benzodiazepine    Chronic, ongoing.  Has been on benzo long term.  Discussed at length risks of long term benzo use.  He is aware of risks.  Refill sent #90 plus 2 refills.  UDS up to date.  Will refer to psychiatry if any increased doses requested, he refuses visit with them at this time.  Contract up to date and UDS due August 2024.      Nicotine dependence, cigarettes, uncomplicated    I have recommended complete cessation of tobacco use. I have discussed various options available for assistance with tobacco cessation including over the counter methods (Nicotine gum, patch and lozenges). We also discussed prescription options (Chantix, Nicotine Inhaler / Nasal Spray). The patient is not interested in pursuing any prescription tobacco cessation options at this time.  Recommend  he return to lung cancer screening, referral placed today.       Relevant Orders   Ambulatory Referral Lung Cancer Screening [facility]   Right shoulder pain    Acute for 3 weeks with underlying hereditary spastic paraplegia.  Recommend he continue Baclofen as needed + use OTC Voltaren gel + heat as needed.  Referral to physical therapy placed.      Other Visit Diagnoses     Encounter for screening for HIV       HIV screening due and obtained today, discussed with patient.   Relevant Orders   HIV Antibody (routine testing w rflx)        Follow up plan: Return in about 3 months (around 01/09/2023) for MOOD.

## 2022-10-10 LAB — CBC WITH DIFFERENTIAL/PLATELET
Basophils Absolute: 0.1 10*3/uL (ref 0.0–0.2)
Basos: 1 %
EOS (ABSOLUTE): 0.2 10*3/uL (ref 0.0–0.4)
Eos: 4 %
Hematocrit: 46.6 % (ref 37.5–51.0)
Hemoglobin: 16.1 g/dL (ref 13.0–17.7)
Immature Grans (Abs): 0 10*3/uL (ref 0.0–0.1)
Immature Granulocytes: 0 %
Lymphocytes Absolute: 2 10*3/uL (ref 0.7–3.1)
Lymphs: 31 %
MCH: 33.1 pg — ABNORMAL HIGH (ref 26.6–33.0)
MCHC: 34.5 g/dL (ref 31.5–35.7)
MCV: 96 fL (ref 79–97)
Monocytes Absolute: 0.5 10*3/uL (ref 0.1–0.9)
Monocytes: 7 %
Neutrophils Absolute: 3.6 10*3/uL (ref 1.4–7.0)
Neutrophils: 57 %
Platelets: 141 10*3/uL — ABNORMAL LOW (ref 150–450)
RBC: 4.87 x10E6/uL (ref 4.14–5.80)
RDW: 12.6 % (ref 11.6–15.4)
WBC: 6.3 10*3/uL (ref 3.4–10.8)

## 2022-10-10 LAB — COMPREHENSIVE METABOLIC PANEL
ALT: 26 IU/L (ref 0–44)
AST: 19 IU/L (ref 0–40)
Albumin/Globulin Ratio: 1.4
Albumin: 4.3 g/dL (ref 3.8–4.9)
Alkaline Phosphatase: 111 IU/L (ref 44–121)
BUN/Creatinine Ratio: 6 — ABNORMAL LOW (ref 9–20)
BUN: 5 mg/dL — ABNORMAL LOW (ref 6–24)
Bilirubin Total: 0.5 mg/dL (ref 0.0–1.2)
CO2: 21 mmol/L (ref 20–29)
Calcium: 9.3 mg/dL (ref 8.7–10.2)
Chloride: 104 mmol/L (ref 96–106)
Creatinine, Ser: 0.81 mg/dL (ref 0.76–1.27)
Globulin, Total: 3 g/dL (ref 1.5–4.5)
Glucose: 123 mg/dL — ABNORMAL HIGH (ref 70–99)
Potassium: 4.4 mmol/L (ref 3.5–5.2)
Sodium: 140 mmol/L (ref 134–144)
Total Protein: 7.3 g/dL (ref 6.0–8.5)
eGFR: 103 mL/min/{1.73_m2} (ref >59)

## 2022-10-10 LAB — LIPID PANEL W/O CHOL/HDL RATIO
Cholesterol, Total: 211 mg/dL — ABNORMAL HIGH (ref 100–199)
HDL: 43 mg/dL (ref >39)
LDL Chol Calc (NIH): 138 mg/dL — ABNORMAL HIGH (ref 0–99)
Triglycerides: 166 mg/dL — ABNORMAL HIGH (ref 0–149)
VLDL Cholesterol Cal: 30 mg/dL (ref 5–40)

## 2022-10-10 LAB — HIV ANTIBODY (ROUTINE TESTING W REFLEX): HIV Screen 4th Generation wRfx: NONREACTIVE

## 2022-10-10 NOTE — Progress Notes (Signed)
Contacted via MyChart   Good morning Harold Tate, your labs have returned: - Kidney function, creatinine and eGFR, remains normal, as is liver function, AST and ALT.  - Cholesterol levels remain elevated, I do recommend with your smoking that you start statin therapy to help get the LDL <70.  I know in past your took statin therapy.  Would you be willing to restart this to help prevent stroke and heart attack? - CBC shows no anemia, but platelet count is mildly low and we will continue to monitor this. - The guideline recommended one time HIV testing for all adults returned negative, no further testing required for this.  Any questions? Keep being amazing!!  Thank you for allowing me to participate in your care.  I appreciate you. Kindest regards, Giordana Weinheimer

## 2022-10-21 ENCOUNTER — Other Ambulatory Visit: Payer: 59

## 2022-10-21 NOTE — Patient Outreach (Signed)
  Medicaid Managed Care   Unsuccessful Outreach Note  10/21/2022 Name: [name] MRN: [id number] DOB: 1965/07/04  Referred by: Marjie Skiff, NP Reason for referral : High Risk Managed Medicaid (MM Unsuccessful telephone outreach )   An unsuccessful telephone outreach was attempted today. The patient was referred to the case management team for assistance with care management and care coordination.   Follow Up Plan: A HIPAA compliant phone message was left for the patient providing contact information and requesting a return call.   Abelino Derrick, MHA [facility]  Managed [facility] Social Worker [phone]

## 2022-10-21 NOTE — Patient Instructions (Signed)
  Medicaid Managed Care   Unsuccessful Outreach Note  10/21/2022 Name: Harold Tate MRN: [id number] DOB: 06/16/1965  Referred by: Cannady, Jolene T, NP Reason for referral : High Risk Managed Medicaid (MM Unsuccessful telephone outreach )   An unsuccessful telephone outreach was attempted today. The patient was referred to the case management team for assistance with care management and care coordination.   Follow Up Plan: A HIPAA compliant phone message was left for the patient providing contact information and requesting a return call.   Ivann Trimarco, BSW, MHA [place]  Managed Medicaid Social Worker [phone]  

## 2022-10-22 ENCOUNTER — Telehealth: Payer: Self-pay

## 2022-10-22 NOTE — Patient Outreach (Signed)
  Medicaid Managed Care   Unsuccessful Outreach Note  10/22/2022 Name: Harold Tate MRN: [id number] DOB: 22-Feb-1966  Referred by: Marjie Skiff, NP Reason for referral : High Risk Managed Medicaid (MM Social work unsuccessful telephone outreach )   An unsuccessful telephone outreach was attempted today. The patient was referred to the case management team for assistance with care management and care coordination.   Follow Up Plan: A HIPAA compliant phone message was left for the patient providing contact information and requesting a return call.   Abelino Derrick, MHA [facility]  Managed [facility] Social Worker [phone]

## 2022-10-22 NOTE — Patient Outreach (Signed)
Medicaid Managed Care Social Work Note  10/22/2022 Name:  Harold Tate MRN:  [id number] DOB:  1966/04/07  Harold Tate is an 57 y.o. year old male who is a primary patient of Cannady, Dorie Rank, NP.  The [facility] Managed Care Coordination team was consulted for assistance with:  Community Resources   Harold Tate was given information about Medicaid Managed Care Coordination team services today. Harold Tate Patient agreed to services and verbal consent obtained.  Engaged with patient  for by telephone forinitial visit in response to referral for case management and/or care coordination services.   Assessments/Interventions:  Review of past medical history, allergies, medications, health status, including review of consultants reports, laboratory and other test data, was performed as part of comprehensive evaluation and provision of chronic care management services.  SDOH: (Social Determinant of Health) assessments and interventions performed: SDOH Interventions    Flowsheet Row Office Visit from 11/27/2021 in [facility] Office Visit from 04/26/2020 in [facility] Office Visit from 05/05/2018 in [facility]  SDOH Interventions     Depression Interventions/Treatment  PHQ2-9 Score <4 Follow-up Not Indicated Currently on Treatment Currently on Treatment     BSW completed a telephone outreach with patient, he kept stating that he was confused on why BSW was calling, BSW informed she received a referral for him, and could send some resources that could help with food rent and utilities. Patient stated he needed insurance, BSW informed patient he has [employer], patient stated he does not have a card in his wallet, but they have been sending him mail. Patient stated he would look through the mail. BSW will mail patient resources.   Advanced Directives Status:  Not addressed in this encounter.  Care Plan                 Allergies   Allergen Reactions   Chlorhexidine Rash    Medications Reviewed Today     Reviewed by Marjie Skiff, NP (Nurse Practitioner) on 10/09/22 at [phone]  Med List Status: <None>   Medication Order Taking? Sig Documenting Provider Last Dose Status Informant  AMINO ACIDS PO [id number] Yes Take by mouth. [provider] Taking Active   baclofen (LIORESAL) 10 MG tablet [id number] Yes Take 10 mg by mouth 3 (three) times daily. [provider] Taking Active   clonazePAM (KLONOPIN) 0.5 MG tablet [id number] Yes Take 1 tablet (0.5 mg total) by mouth 3 (three) times daily as needed for anxiety. Aura Dials T, NP Taking Active   fexofenadine [facility] ALLERGY) 180 MG tablet [id number] Yes Take 1 tablet (180 mg total) by mouth daily. Loura Pardon, MD Taking Active   rosuvastatin (CRESTOR) 5 MG tablet [id number] Yes Take 1 tablet (5 mg total) by mouth daily. Aura Dials T, NP Taking Active   sildenafil (VIAGRA) 100 MG tablet [id number] Yes Take 0.5-1 tablets (50-100 mg total) by mouth daily as needed for erectile dysfunction. Loura Pardon, MD Taking Active   TAURINE PO [id number] Yes Take by mouth. [provider] Taking Active             Patient Active Problem List   Diagnosis Date Noted   Right shoulder pain 10/09/2022   Controlled substance agreement signed 07/09/2022   Cirrhosis of liver (HCC) 03/07/2021   Aortic atherosclerosis (HCC) 12/28/2020   Polyp of ascending colon    Family history of polyps in the colon 08/24/2020   Thrombocytopenia (HCC) 05/31/2020  B12 deficiency 04/24/2020   Hereditary spastic paraparesis (HCC) 04/22/2020   History of deep vein thrombosis (DVT) of lower extremity 09/03/2019   Long-term current use of benzodiazepine 06/10/2019   Status post total hip replacement, left 02/11/2019   Depression, major, single episode, moderate (HCC) 01/11/2019   Status post total hip replacement, right 11/03/2018   Osteoarthritis of both hips  10/01/2018   Type 2 diabetes mellitus with obesity (HCC) 11/05/2016   Hyperlipidemia associated with type 2 diabetes mellitus (HCC) 06/06/2016   Raynaud's syndrome 09/30/2014   Generalized anxiety disorder 09/30/2014   Nicotine dependence, cigarettes, uncomplicated 09/30/2014   Hypertension associated with diabetes (HCC) 09/30/2014    Conditions to be addressed/monitored per PCP order:   community resources  There are no care plans that you recently modified to display for this patient.   Follow up:  Patient agrees to Care Plan and Follow-up.  Plan: The Managed Medicaid care management team will reach out to the patient again over the next 30 days.  Date/time of next scheduled Social Work care management/care coordination outreach:  11/21/22  Gus Puma, Kenard Gower, [facility]  Managed [facility] Social Worker [phone]

## 2022-10-22 NOTE — Patient Instructions (Signed)
Visit Information  Mr. Harold Tate was given information about Medicaid Managed Care team care coordination services as a part of their [facility] Community Plan Medicaid benefit. Harold Tate verbally consented to engagement with the [facility] Managed Care team.   If you are experiencing a medical emergency, please call 911 or report to your local emergency department or urgent care.   If you have a non-emergency medical problem during routine business hours, please contact your provider's office and ask to speak with a nurse.   For questions related to your [facility], please call: [phone] or visit the homepage here: [email or web address]  If you would like to schedule transportation through your [facility], please call the following number at least 2 days in advance of your appointment: [phone]   Rides for urgent appointments can also be made after hours by calling Member Services.  Call the Behavioral Health Crisis Line at [phone], at any time, 24 hours a day, 7 days a week. If you are in danger or need immediate medical attention call 911.  If you would like help to quit smoking, call 1-800-QUIT-NOW (816 685 9631) OR Espaol: 1-855-Djelo-Ya (6-387-564-3329) o para ms informacin haga clic aqu or Text READY to 518-841 to register via text  Harold Tate - following are the goals we discussed in your visit today:   Goals Addressed   None    Social Worker will follow up in 30 days.   Harold Tate, Harold Tate, MHA [facility]  Managed Medicaid Social Worker [phone]   Following is a copy of your plan of care:  There are no care plans that you recently modified to display for this patient.

## 2023-01-05 NOTE — Patient Instructions (Signed)
Managing Anxiety, Adult After being diagnosed with anxiety, you may be relieved to know why you have felt or behaved a certain way. You may also feel overwhelmed about the treatment ahead and what it will mean for your life. With care and support, you can manage your anxiety. How to manage lifestyle changes Understanding the difference between stress and anxiety Although stress can play a role in anxiety, it is not the same as anxiety. Stress is your body's reaction to life changes and events, both good and bad. Stress is often caused by something external, such as a deadline, test, or competition. It normally goes away after the event has ended and will last just a few hours. But, stress can be ongoing and can lead to more than just stress. Anxiety is caused by something internal, such as imagining a terrible outcome or worrying that something will go wrong that will greatly upset you. Anxiety often does not go away even after the event is over, and it can become a long-term (chronic) worry. Lowering stress and anxiety Talk with your health care provider or a counselor to learn more about lowering anxiety and stress. They may suggest tension-reduction techniques, such as: Music. Spend time creating or listening to music that you enjoy and that inspires you. Mindfulness-based meditation. Practice being aware of your normal breaths while not trying to control your breathing. It can be done while sitting or walking. Centering prayer. Focus on a word, phrase, or sacred image that means something to you and brings you peace. Deep breathing. Expand your stomach and inhale slowly through your nose. Hold your breath for 3-5 seconds. Then breathe out slowly, letting your stomach muscles relax. Self-talk. Learn to notice and spot thought patterns that lead to anxiety reactions. Change those patterns to thoughts that feel peaceful. Muscle relaxation. Take time to tense muscles and then relax them. Choose a  tension-reduction technique that fits your lifestyle and personality. These techniques take time and practice. Set aside 5-15 minutes a day to do them. Specialized therapists can offer counseling and training in these techniques. The training to help with anxiety may be covered by some insurance plans. Other things you can do to manage stress and anxiety include: Keeping a stress diary. This can help you learn what triggers your reaction and then learn ways to manage your response. Thinking about how you react to certain situations. You may not be able to control everything, but you can control your response. Making time for activities that help you relax and not feeling guilty about spending your time in this way. Doing visual imagery. This involves imagining or creating mental pictures to help you relax. Practicing yoga. Through yoga poses, you can lower tension and relax.  Medicines Medicines for anxiety include: Antidepressant medicines. These are usually prescribed for long-term daily control. Anti-anxiety medicines. These may be added in severe cases, especially when panic attacks occur. When used together, medicines, psychotherapy, and tension-reduction techniques may be the most effective treatment. Relationships Relationships can play a big part in helping you recover. Spend more time connecting with trusted friends and family members. Think about going to couples counseling if you have a partner, taking family education classes, or going to family therapy. Therapy can help you and others better understand your anxiety. How to recognize changes in your anxiety Everyone responds differently to treatment for anxiety. Recovery from anxiety happens when symptoms lessen and stop interfering with your daily life at home or work. This may mean that you   will start to: Have better concentration and focus. Worry will interfere less in your daily thinking. Sleep better. Be less irritable. Have more  energy. Have improved memory. Try to recognize when your condition is getting worse. Contact your provider if your symptoms interfere with home or work and you feel like your condition is not improving. Follow these instructions at home: Activity Exercise. Adults should: Exercise for at least 150 minutes each week. The exercise should increase your heart rate and make you sweat (moderate-intensity exercise). Do strengthening exercises at least twice a week. Get the right amount and quality of sleep. Most adults need 7-9 hours of sleep each night. Lifestyle  Eat a healthy diet that includes plenty of vegetables, fruits, whole grains, low-fat dairy products, and lean protein. Do not eat a lot of foods that are high in fats, added sugars, or salt (sodium). Make choices that simplify your life. Do not use any products that contain nicotine or tobacco. These products include cigarettes, chewing tobacco, and vaping devices, such as e-cigarettes. If you need help quitting, ask your provider. Avoid caffeine, alcohol, and certain over-the-counter cold medicines. These may make you feel worse. Ask your pharmacist which medicines to avoid. General instructions Take over-the-counter and prescription medicines only as told by your provider. Keep all follow-up visits. This is to make sure you are managing your anxiety well or if you need more support. Where to find support You can get help and support from: Self-help groups. Online and community organizations. A trusted spiritual leader. Couples counseling. Family education classes. Family therapy. Where to find more information You may find that joining a support group helps you deal with your anxiety. The following sources can help you find counselors or support groups near you: Mental Health America: mentalhealthamerica.net Anxiety and Depression Association of America (ADAA): adaa.org National Alliance on Mental Illness (NAMI): nami.org Contact  a health care provider if: You have a hard time staying focused or finishing tasks. You spend many hours a day feeling worried about everyday life. You are very tired because you cannot stop worrying. You start to have headaches or often feel tense. You have chronic nausea or diarrhea. Get help right away if: Your heart feels like it is racing. You have shortness of breath. You have thoughts of hurting yourself or others. Get help right away if you feel like you may hurt yourself or others, or have thoughts about taking your own life. Go to your nearest emergency room or: Call 911. Call the National Suicide Prevention Lifeline at 1-800-273-8255 or 988. This is open 24 hours a day. Text the Crisis Text Line at 741741. This information is not intended to replace advice given to you by your health care provider. Make sure you discuss any questions you have with your health care provider. Document Revised: 01/22/2022 Document Reviewed: 08/06/2020 Elsevier Patient Education  2024 Elsevier Inc.  

## 2023-01-09 ENCOUNTER — Encounter: Payer: Self-pay | Admitting: Nurse Practitioner

## 2023-01-09 ENCOUNTER — Ambulatory Visit (INDEPENDENT_AMBULATORY_CARE_PROVIDER_SITE_OTHER): Payer: 59 | Admitting: Nurse Practitioner

## 2023-01-09 VITALS — BP 114/71 | HR 75 | Temp 97.4°F | Ht 74.0 in | Wt 218.4 lb

## 2023-01-09 DIAGNOSIS — F411 Generalized anxiety disorder: Secondary | ICD-10-CM | POA: Diagnosis not present

## 2023-01-09 DIAGNOSIS — F321 Major depressive disorder, single episode, moderate: Secondary | ICD-10-CM | POA: Diagnosis not present

## 2023-01-09 DIAGNOSIS — Z79899 Other long term (current) drug therapy: Secondary | ICD-10-CM | POA: Diagnosis not present

## 2023-01-09 DIAGNOSIS — H6993 Unspecified Eustachian tube disorder, bilateral: Secondary | ICD-10-CM

## 2023-01-09 MED ORDER — PREDNISONE 20 MG PO TABS: 40.0000 mg | ORAL_TABLET | Freq: Every day | ORAL | 0 refills | Status: AC

## 2023-01-09 MED ORDER — CLONAZEPAM 0.5 MG PO TABS
0.5000 mg | ORAL_TABLET | Freq: Three times a day (TID) | ORAL | 2 refills | Status: DC | PRN
Start: 2023-01-09 — End: 2023-04-25

## 2023-01-09 NOTE — Assessment & Plan Note (Signed)
Chronic, ongoing.  Denies SI/HI.   Refills sent on Klonopin, refer to anxiety plan.  Have recommended psychiatry visit, he refuses.  Has failed multiple maintenance medications. 

## 2023-01-09 NOTE — Progress Notes (Signed)
BP 114/71   Pulse 75   Temp (!) 97.4 F (36.3 C) (Oral)   Ht 6\' 2"  (1.88 m)   Wt 218 lb 6.4 oz (99.1 kg)   SpO2 94%   BMI 28.04 kg/m    Subjective:    Patient ID: Harold Tate, male    DOB: 06-Jun-1965, 57 y.o.   MRN: [id number]  HPI: Harold Tate is a 57 y.o. male  Chief Complaint  Patient presents with   Anxiety   Depression   EAR PRESSURE Is on abx for a tooth issue. Duration: days Involved ear(s): bilateral Fever: no Otorrhea: no Upper respiratory infection symptoms: no Pruritus: no Hearing loss: no Water immersion no Using Q-tips: yes Recurrent otitis media: no Status: stable Treatments attempted: none   ANXIETY/STRESS Continues on Clonazepam 0.5 MG TID ordered by previous PCP, Dr. Dossie Arbour.  Has been on this for several years, used to be Xanax but was changed due to no benefit with Xanax. Pt is aware of risks of benzo medication use to include increased sedation, respiratory suppression, falls, extrapyramidal movements, dependence and cardiovascular events. Pt would like to continue treatment as benefit determined to outweigh risk. In past when he tried to quit taking Xanax 1 MG TID, it became worse and then he broke out in hives.     Has tried Wellbutrin, Celexa, and Prozac in past without success. Wellbutrin caused weird thoughts.  Review of PDMP his last Klonopin fill was 12/06/22 for 30 day supply, no other recent controlled substance fills.   Duration:stable Anxious mood: yes occasional Excessive worrying: yes occasional Irritability: no  Sweating: no Nausea: no Palpitations:no Hyperventilation: no Panic attacks: yes occasional Agoraphobia: no  Obscessions/compulsions: no Depressed mood: no    01/31/23    9:08 AM 10/09/2022    8:59 AM 07/09/2022   11:19 AM 04/09/2022   11:18 AM 11/27/2021    3:46 PM  Depression screen PHQ 2/9  Decreased Interest 0 0 1 0 0  Down, Depressed, Hopeless 0 0 1 0 0  PHQ - 2 Score 0 0 2 0 0  Altered sleeping 0 0 0 0  1  Tired, decreased energy 0 0 2 3 1   Change in appetite 0 0 0 0 0  Feeling bad or failure about yourself  0 0 0 0 0  Trouble concentrating 0 0 0 0 0  Moving slowly or fidgety/restless 0 0 0 0 0  Suicidal thoughts 0 0 0 0 0  PHQ-9 Score 0 0 4 3 2   Difficult doing work/chores Not difficult at all  Not difficult at all Not difficult at all Not difficult at all  Anhedonia: no Weight changes: no Insomnia: none Hypersomnia: no Fatigue/loss of energy: no Feelings of worthlessness: no Feelings of guilt: no Impaired concentration/indecisiveness: no Suicidal ideations: no  Crying spells: no Recent Stressors/Life Changes: yes   Relationship problems: no   Family stress: yes father had a stroke & aunt fell -- he is assisting to look after them + he is concerned his mother has dementia   Financial stress: no    Job stress: yes   Recent death/loss: no     2023-01-31    9:10 AM 07/09/2022   11:19 AM 04/09/2022   11:18 AM 11/27/2021    3:46 PM  GAD 7 : Generalized Anxiety Score  Nervous, Anxious, on Edge 3 3 0 3  Control/stop worrying 2 2 0 3  Worry too much - different things 2 1 0 3  Trouble relaxing 0 0 0 2  Restless 0 0 0 2  Easily annoyed or irritable 0 0 0 0  Afraid - awful might happen 0 0 0 1  Total GAD 7 Score 7 6 0 14  Anxiety Difficulty Not difficult at all Not difficult at all Not difficult at all Very difficult   Relevant past medical, surgical, family and social history reviewed and updated as indicated. Interim medical history since our last visit reviewed. Allergies and medications reviewed and updated.  Review of Systems  Constitutional:  Negative for activity change, diaphoresis, fatigue and fever.  Respiratory:  Negative for cough, chest tightness, shortness of breath and wheezing.   Cardiovascular:  Negative for chest pain, palpitations and leg swelling.  Gastrointestinal: Negative.   Endocrine: Negative for cold intolerance, heat intolerance, polydipsia, polyphagia  and polyuria.  Neurological: Negative.   Psychiatric/Behavioral: Negative.     Per HPI unless specifically indicated above     Objective:    BP 114/71   Pulse 75   Temp (!) 97.4 F (36.3 C) (Oral)   Ht 6\' 2"  (1.88 m)   Wt 218 lb 6.4 oz (99.1 kg)   SpO2 94%   BMI 28.04 kg/m   Wt Readings from Last 3 Encounters:  01/09/23 218 lb 6.4 oz (99.1 kg)  10/09/22 219 lb 6.4 oz (99.5 kg)  07/09/22 211 lb 12.8 oz (96.1 kg)    Physical Exam Vitals and nursing note reviewed.  Constitutional:      General: He is awake. He is not in acute distress.    Appearance: He is well-developed and well-groomed. He is not ill-appearing or toxic-appearing.  HENT:     Head: Normocephalic.     Right Ear: Hearing, ear canal and external ear normal. A middle ear effusion is present. Tympanic membrane is not injected.     Left Ear: Hearing, ear canal and external ear normal. A middle ear effusion is present. Tympanic membrane is not injected.  Eyes:     General: Lids are normal.     Extraocular Movements: Extraocular movements intact.     Conjunctiva/sclera: Conjunctivae normal.  Neck:     Thyroid: No thyromegaly.     Vascular: No carotid bruit.  Cardiovascular:     Rate and Rhythm: Normal rate and regular rhythm.     Heart sounds: Normal heart sounds. No murmur heard.    No gallop.  Pulmonary:     Effort: No accessory muscle usage or respiratory distress.     Breath sounds: Normal breath sounds.  Abdominal:     General: Bowel sounds are normal. There is no distension.     Palpations: Abdomen is soft.     Tenderness: There is no abdominal tenderness.  Musculoskeletal:     Cervical back: Full passive range of motion without pain.     Right lower leg: No edema.     Left lower leg: No edema.  Lymphadenopathy:     Cervical: No cervical adenopathy.  Skin:    General: Skin is warm.     Capillary Refill: Capillary refill takes less than 2 seconds.  Neurological:     Mental Status: He is alert and  oriented to Tate, place, and time.     Deep Tendon Reflexes: Reflexes are normal and symmetric.     Reflex Scores:      Brachioradialis reflexes are 2+ on the right side and 2+ on the left side.      Patellar reflexes are 2+ on the  right side and 2+ on the left side. Psychiatric:        Attention and Perception: Attention normal.        Mood and Affect: Mood normal.        Speech: Speech normal.        Behavior: Behavior normal. Behavior is cooperative.        Thought Content: Thought content normal.    Results for orders placed or performed in visit on 10/09/22  Bayer DCA Hb A1c Waived  Result Value Ref Range   HB A1C (BAYER DCA - WAIVED) 6.7 (H) 4.8 - 5.6 %  Microalbumin, Urine Waived  Result Value Ref Range   Microalb, Ur Waived 30 (H) 0 - 19 mg/L   Creatinine, Urine Waived 200 10 - 300 mg/dL   Microalb/Creat Ratio <30 <30 mg/g  Comprehensive metabolic panel  Result Value Ref Range   Glucose 123 (H) 70 - 99 mg/dL   BUN 5 (L) 6 - 24 mg/dL   Creatinine, Ser 4.78 0.76 - 1.27 mg/dL   eGFR 295 >62 ZH/YQM/5.78   BUN/Creatinine Ratio 6 (L) 9 - 20   Sodium 140 134 - 144 mmol/L   Potassium 4.4 3.5 - 5.2 mmol/L   Chloride 104 96 - 106 mmol/L   CO2 21 20 - 29 mmol/L   Calcium 9.3 8.7 - 10.2 mg/dL   Total Protein 7.3 6.0 - 8.5 g/dL   Albumin 4.3 3.8 - 4.9 g/dL   Globulin, Total 3.0 1.5 - 4.5 g/dL   Albumin/Globulin Ratio 1.4    Bilirubin Total 0.5 0.0 - 1.2 mg/dL   Alkaline Phosphatase 111 44 - 121 IU/L   AST 19 0 - 40 IU/L   ALT 26 0 - 44 IU/L  Lipid Panel w/o Chol/HDL Ratio  Result Value Ref Range   Cholesterol, Total 211 (H) 100 - 199 mg/dL   Triglycerides 469 (H) 0 - 149 mg/dL   HDL 43 >62 mg/dL   VLDL Cholesterol Cal 30 5 - 40 mg/dL   LDL Chol Calc (NIH) 952 (H) 0 - 99 mg/dL  CBC with Differential/Platelet  Result Value Ref Range   WBC 6.3 3.4 - 10.8 x10E3/uL   RBC 4.87 4.14 - 5.80 x10E6/uL   Hemoglobin 16.1 13.0 - 17.7 g/dL   Hematocrit 84.1 32.4 - 51.0 %   MCV  96 79 - 97 fL   MCH 33.1 (H) 26.6 - 33.0 pg   MCHC 34.5 31.5 - 35.7 g/dL   RDW 40.1 02.7 - 25.3 %   Platelets 141 (L) 150 - 450 x10E3/uL   Neutrophils 57 Not Estab. %   Lymphs 31 Not Estab. %   Monocytes 7 Not Estab. %   Eos 4 Not Estab. %   Basos 1 Not Estab. %   Neutrophils Absolute 3.6 1.4 - 7.0 x10E3/uL   Lymphocytes Absolute 2.0 0.7 - 3.1 x10E3/uL   Monocytes Absolute 0.5 0.1 - 0.9 x10E3/uL   EOS (ABSOLUTE) 0.2 0.0 - 0.4 x10E3/uL   Basophils Absolute 0.1 0.0 - 0.2 x10E3/uL   Immature Granulocytes 0 Not Estab. %   Immature Grans (Abs) 0.0 0.0 - 0.1 x10E3/uL  HIV Antibody (routine testing w rflx)  Result Value Ref Range   HIV Screen 4th Generation wRfx Non Reactive Non Reactive      Assessment & Plan:   Problem List Items Addressed This Visit       Nervous and Auditory   Eustachian tube dysfunction, bilateral    Acute, suspect related  to allergies.  Will start Prednisone 40 MG daily for 5 days, his recent A1c levels have been stable and informed him this may push sugars up for short period.  Recommend he take Claritin 10 MG daily for 10 days.  Return if worsening or ongoing.        Other   Generalized anxiety disorder (Chronic)    Chronic, ongoing.  Has been on benzo long term started by his previous PCP.  Discussed at length risks of long term benzo use, including risk for dementia.  He is fully aware of risks and wishes to continue, refuses psychiatry visit.  Refill sent #90 plus 2 refills.  Have HIGHLY recommend he not take more then ordered and that he trial a reduction of it, trial BID dosing.  Will have return to office in 3 months.  UDS today and due next 01/09/24, contract up to date.      Relevant Medications   clonazePAM (KLONOPIN) 0.5 MG tablet   Other Relevant Orders   [id number] 11+Oxyco+Alc+Crt-Bund   Depression, major, single episode, moderate (HCC) - Primary    Chronic, ongoing.  Denies SI/HI.   Refills sent on Klonopin, refer to anxiety plan.  Have  recommended psychiatry visit, he refuses.  Has failed multiple maintenance medications.      Long-term current use of benzodiazepine    Refer to anxiety plan of care.      Relevant Orders   [id number] 11+Oxyco+Alc+Crt-Bund     Follow up plan: Return in about 3 months (around 04/10/2023) for T2DM, HTN/HLD, MOOD.

## 2023-01-09 NOTE — Assessment & Plan Note (Signed)
Refer to anxiety plan of care. 

## 2023-01-09 NOTE — Assessment & Plan Note (Signed)
Chronic, ongoing.  Has been on benzo long term started by his previous PCP.  Discussed at length risks of long term benzo use, including risk for dementia.  He is fully aware of risks and wishes to continue, refuses psychiatry visit.  Refill sent #90 plus 2 refills.  Have HIGHLY recommend he not take more then ordered and that he trial a reduction of it, trial BID dosing.  Will have return to office in 3 months.  UDS today and due next 01/09/24, contract up to date.

## 2023-01-09 NOTE — Assessment & Plan Note (Signed)
Acute, suspect related to allergies.  Will start Prednisone 40 MG daily for 5 days, his recent A1c levels have been stable and informed him this may push sugars up for short period.  Recommend he take Claritin 10 MG daily for 10 days.  Return if worsening or ongoing.

## 2023-01-09 NOTE — Addendum Note (Signed)
Addended by: Aura Dials T on: 01/09/2023 10:08 AM   Modules accepted: Orders

## 2023-01-10 LAB — DRUG SCREEN 764883 11+OXYCO+ALC+CRT-BUND
Amphetamines, Urine: NEGATIVE ng/mL
BENZODIAZ UR QL: NEGATIVE ng/mL
Barbiturate: NEGATIVE ng/mL
Cannabinoid Quant, Ur: NEGATIVE ng/mL
Cocaine (Metabolite): NEGATIVE ng/mL
Creatinine: 91.3 mg/dL (ref 20.0–300.0)
Ethanol: NEGATIVE %
Meperidine: NEGATIVE ng/mL
Methadone Screen, Urine: NEGATIVE ng/mL
OPIATE SCREEN URINE: NEGATIVE ng/mL
Oxycodone/Oxymorphone, Urine: NEGATIVE ng/mL
Phencyclidine: NEGATIVE ng/mL
Propoxyphene: NEGATIVE ng/mL
Tramadol: NEGATIVE ng/mL
pH, Urine: 5.3 (ref 4.5–8.9)

## 2023-01-29 ENCOUNTER — Ambulatory Visit: Payer: Self-pay

## 2023-01-29 ENCOUNTER — Telehealth: Payer: Self-pay | Admitting: Nurse Practitioner

## 2023-01-29 NOTE — Telephone Encounter (Signed)
Chief Complaint: Leg pain  Symptoms: Left leg calf pain, tightness in calf area Frequency: constant for a few days Pertinent Negatives: Patient denies chest pain, numbness   Disposition: [x] ED /[] Urgent Care (no appt availability in office) / [] Appointment(In office/virtual)/ []  [facility]/ [] Home Care/ [] Refused Recommended Disposition /[] [facility]/ []  Follow-up with PCP Additional Notes: Patient stated he has had left leg calf pain for a few days now and has a history of blood clots. Patient states the left calf is tight and moderately painful. Patient states he has had 2 blood clots in the past and was treated with xarelto. Care advice was given an patient offered an appointment in office, patient declined and stated he would go to the ED so he can have an ultrasound done. Advised patient to callback if symptoms get worse. Patient verbalized understanding.  Reason for Disposition  [1] Thigh or calf pain AND [2] only 1 side AND [3] present > 1 hour (Exception: Chronic unchanged pain.)  Answer Assessment - Initial Assessment Questions 1. ONSET: "When did the pain start?"      A few days ago 2. LOCATION: "Where is the pain located?"      Left leg back of the calf  3. PAIN: "How bad is the pain?"    (Scale 1-10; or mild, moderate, severe)   -  MILD (1-3): doesn't interfere with normal activities    -  MODERATE (4-7): interferes with normal activities (e.g., work or school) or awakens from sleep, limping    -  SEVERE (8-10): excruciating pain, unable to do any normal activities, unable to walk     Moderate 4. WORK OR EXERCISE: "Has there been any recent work or exercise that involved this part of the body?"      No  5. CAUSE: "What do you think is causing the leg pain?"     I have a history of blood clots  6. OTHER SYMPTOMS: "Do you have any other symptoms?" (e.g., chest pain, back pain, breathing difficulty, swelling, rash, fever, numbness, weakness)     The left  calf feels tight,  Protocols used: Leg Pain-A-AH

## 2023-01-31 ENCOUNTER — Other Ambulatory Visit: Payer: Self-pay

## 2023-01-31 ENCOUNTER — Emergency Department
Admission: EM | Admit: 2023-01-31 | Discharge: 2023-01-31 | Disposition: A | Discharge status: Home / Self Care | Payer: 59 | Attending: Emergency Medicine | Admitting: Emergency Medicine

## 2023-01-31 ENCOUNTER — Emergency Department: Payer: 59

## 2023-01-31 DIAGNOSIS — Z7901 Long term (current) use of anticoagulants: Secondary | ICD-10-CM | POA: Diagnosis not present

## 2023-01-31 DIAGNOSIS — M79605 Pain in left leg: Secondary | ICD-10-CM | POA: Diagnosis not present

## 2023-01-31 DIAGNOSIS — I82442 Acute embolism and thrombosis of left tibial vein: Secondary | ICD-10-CM | POA: Diagnosis not present

## 2023-01-31 DIAGNOSIS — I82432 Acute embolism and thrombosis of left popliteal vein: Secondary | ICD-10-CM | POA: Diagnosis not present

## 2023-01-31 MED ORDER — RIVAROXABAN (XARELTO) VTE STARTER PACK (15 & 20 MG): ORAL_TABLET | ORAL | 0 refills | Status: DC

## 2023-01-31 NOTE — ED Triage Notes (Signed)
Pt comes with c/o left leg pain. Pt states he has blood clot there. Pt states pain and swelling. Pt states this all started 3 days ago.

## 2023-01-31 NOTE — ED Provider Notes (Addendum)
[facility] Provider Note    Event Date/Time   First MD Initiated Contact with Patient 01/31/23 1714     (approximate)   History   Leg Pain   HPI  Harold Tate is a 57 y.o. male with a history of DVT in the past who presents with complaints of left leg pain over the last 2 to 3 days.  He reports it feels similar to when he had a DVT in the past.  He describes the discomfort is behind his left knee and in his upper posterior calf.  No shortness of breath, no pleurisy     Physical Exam   Triage Vital Signs: ED Triage Vitals  Encounter Vitals Group     BP 01/31/23 1332 136/83     Systolic BP Percentile --      Diastolic BP Percentile --      Pulse Rate 01/31/23 1332 93     Resp 01/31/23 1332 18     Temp 01/31/23 1332 98 F (36.7 C)     Temp src --      SpO2 01/31/23 1332 95 %     Weight --      Height --      Head Circumference --      Peak Flow --      Pain Score 01/31/23 1330 7     Pain Loc --      Pain Education --      Exclude from Growth Chart --     Most recent vital signs: Vitals:   01/31/23 1722 01/31/23 1754  BP: 104/87 130/89  Pulse: 66 74  Resp: 18 18  Temp:    SpO2: 99% 99%     General: Awake, no distress.  CV:  Good peripheral perfusion.  Resp:  Normal effort.  Abd:  No distention.  Other:  Left leg: Warm and well-perfused, no significant swelling, no rash or redness   ED Results / Procedures / Treatments   Labs (all labs ordered are listed, but only abnormal results are displayed) Labs Reviewed - No data to display   EKG     RADIOLOGY Ultrasound is consistent with popliteal DVT    PROCEDURES:  Critical Care performed:   Procedures   MEDICATIONS ORDERED IN ED: Medications - No data to display   IMPRESSION / MDM / ASSESSMENT AND PLAN / ED COURSE  I reviewed the triage vital signs and the nursing notes. Patient's presentation is most consistent with acute presentation with potential threat  to life or bodily function.  Patient presents with calf pain as detailed above, concerning for DVT versus muscle injury.  Ultrasound confirms DVT.  Will restart the patient on Xarelto have him follow-up closely with vascular surgery for further evaluation.  I reviewed patient's labs from June which were unremarkable      FINAL CLINICAL IMPRESSION(S) / ED DIAGNOSES   Final diagnoses:  Acute deep vein thrombosis (DVT) of popliteal vein of left lower extremity (HCC)     Rx / DC Orders   ED Discharge Orders          Ordered    RIVAROXABAN (XARELTO) VTE STARTER PACK (15 & 20 MG)        01/31/23 1745             Note:  This document was prepared using Dragon voice recognition software and may include unintentional dictation errors.   Jene Every, MD 01/31/23 [name]    Jene Every,  MD 01/31/23 [id number]

## 2023-01-31 NOTE — ED Notes (Signed)
ED Provider at bedside. 

## 2023-02-09 DIAGNOSIS — I82432 Acute embolism and thrombosis of left popliteal vein: Secondary | ICD-10-CM | POA: Insufficient documentation

## 2023-02-09 DIAGNOSIS — I82532 Chronic embolism and thrombosis of left popliteal vein: Secondary | ICD-10-CM | POA: Insufficient documentation

## 2023-02-09 NOTE — Patient Instructions (Signed)
Deep Vein Thrombosis  Deep vein thrombosis (DVT) is a condition in which a blood clot forms in a vein of the deep venous system. This can occur in the lower leg, thigh, pelvis, arm, or neck. A clot is blood that has thickened into a gel or solid. This condition is serious and can be life-threatening if the clot travels to the arteries of the lungs and causes a blockage (pulmonary embolism). A DVT can also damage veins in the leg, which can lead to long-term venous disease, leg pain, swelling, discoloration, and ulcers or sores (post-thrombotic syndrome). What are the causes? This condition may be caused by: A slowdown of blood flow. Damage to a vein. A condition that causes blood to clot more easily, such as certain bleeding disorders. What increases the risk? The following factors may make you more likely to develop this condition: Obesity. Being older, especially older than age 4. Being inactive or not moving around (sedentary lifestyle). This may include: Sitting or lying down for longer than 4-6 hours other than to sleep at night. Being in the hospital, or having major or lengthy surgery. Having any recent bone injuries, such as breaks (fractures), that reduce movement, especially in the lower extremities. Having recent orthopedic surgery on the lower extremities. Being pregnant, giving birth, or having recently given birth. Taking medicines that contain estrogen, such as birth control or hormone replacement therapy. Using products that contain nicotine or tobacco, especially if you use hormonal birth control. Having a history of a blood vessel disease (peripheral vascular disease) or congestive heart disease. Having a history of cancer, especially if being treated with chemotherapy. What are the signs or symptoms? Symptoms of this condition include: Swelling, pain, pressure, or tenderness in an arm or a leg. An arm or a leg becoming warm, red, or discolored. A leg turning very pale or  blue. You may have a large DVT. This is rare. If the clot is in your leg, you may notice that symptoms get worse when you stand or walk. In some cases, there are no symptoms. How is this diagnosed? This condition is diagnosed with: Your medical history and a physical exam. Tests, such as: Blood tests to check how well your blood clots. Doppler ultrasound. This is the best way to find a DVT. CT venogram. Contrast dye is injected into a vein, and X-rays are taken to check for clots. This is helpful for veins in the chest or pelvis. How is this treated? Treatment for this condition depends on: The cause of your DVT. The size and location of your DVT, or having more than one DVT. Your risk for bleeding or developing more clots. Other medical conditions you may have. Treatment may include: Taking a blood thinner medicine (anticoagulant) to prevent more clots from forming or current clots from growing. Wearing compression stockings. Injecting medicines into the affected vein to break up the clot (catheter-directed thrombolysis). Surgical procedures, when DVT is severe or hard to treat. These may be done to: Isolate and remove your clot. Place an inferior vena cava (IVC) filter. This filter is placed into a large vein called the inferior vena cava to catch blood clots before they reach your lungs. You may get some medical treatments for 6 months or longer. Follow these instructions at home: If you are taking blood thinners: Talk with your health care provider before you take any medicines that contain aspirin or NSAIDs, such as ibuprofen. These medicines increase your risk for dangerous bleeding. Take your medicine exactly  as told, at the same time every day. Do not skip a dose. Do not take more than the prescribed dose. This is important. Ask your health care provider about foods and medicines that could change or interact with the way your blood thinner works. Avoid these foods and medicines  if you are told to do so. Avoid anything that may cause bleeding or bruising. You may bleed more easily while taking blood thinners. Be very careful when using knives, scissors, or other sharp objects. Use an electric razor instead of a blade. Avoid activities that could cause injury or bruising, and follow instructions for preventing falls. Tell your health care provider if you have had any internal bleeding, bleeding ulcers, or neurologic diseases, such as strokes or cerebral aneurysms. Wear a medical alert bracelet or carry a card that lists what medicines you take. General instructions Take over-the-counter and prescription medicines only as told by your health care provider. Return to your normal activities as told by your health care provider. Ask your health care provider what activities are safe for you. If recommended, wear compression stockings as told by your health care provider. These stockings help to prevent blood clots and reduce swelling in your legs. Never wear your compression stockings while sleeping at night. Keep all follow-up visits. This is important. Where to find more information American Heart Association: www.heart.org Centers for Disease Control and Prevention: FootballExhibition.com.br National Heart, Lung, and Blood Institute: PopSteam.is Contact a health care provider if: You miss a dose of your blood thinner. You have unusual bruising or other color changes. You have new or worse pain, swelling, or redness in an arm or a leg. You have worsening numbness or tingling in an arm or a leg. You have a significant color change (pale or blue) in the extremity that has the DVT. Get help right away if: You have signs or symptoms that a blood clot has moved to the lungs. These may include: Shortness of breath. Chest pain. Fast or irregular heartbeats (palpitations). Light-headedness, dizziness, or fainting. Coughing up blood. You have signs or symptoms that your blood is  too thin. These may include: Blood in your vomit, stool, or urine. A cut that will not stop bleeding. A menstrual period that is heavier than usual. A severe headache or confusion. These symptoms may be an emergency. Get help right away. Call 911. Do not wait to see if the symptoms will go away. Do not drive yourself to the hospital. Summary Deep vein thrombosis (DVT) happens when a blood clot forms in a deep vein. This may occur in the lower leg, thigh, pelvis, arm, or neck. Symptoms affect the arm or leg and can include swelling, pain, tenderness, warmth, redness, or discoloration. This condition may be treated with medicines. In severe cases, a procedure or surgery may be done to remove or dissolve the clots. If you are taking blood thinners, take them exactly as told. Do not skip a dose. Do not take more than is prescribed. Get help right away if you have a severe headache, shortness of breath, chest pain, fast or irregular heartbeats, or blood in your vomit, urine, or stool. This information is not intended to replace advice given to you by your health care provider. Make sure you discuss any questions you have with your health care provider. Document Revised: 11/06/2020 Document Reviewed: 11/06/2020 Elsevier Patient Education  2024 [employer].

## 2023-02-10 ENCOUNTER — Telehealth: Payer: Self-pay

## 2023-02-10 ENCOUNTER — Telehealth: Payer: Self-pay | Admitting: Nurse Practitioner

## 2023-02-10 ENCOUNTER — Encounter: Payer: Self-pay | Admitting: Nurse Practitioner

## 2023-02-10 ENCOUNTER — Ambulatory Visit (INDEPENDENT_AMBULATORY_CARE_PROVIDER_SITE_OTHER): Payer: 59 | Admitting: Nurse Practitioner

## 2023-02-10 VITALS — BP 126/82 | HR 74 | Ht 74.0 in | Wt 209.0 lb

## 2023-02-10 DIAGNOSIS — I82432 Acute embolism and thrombosis of left popliteal vein: Secondary | ICD-10-CM | POA: Diagnosis not present

## 2023-02-10 DIAGNOSIS — Z86718 Personal history of other venous thrombosis and embolism: Secondary | ICD-10-CM | POA: Diagnosis not present

## 2023-02-10 MED ORDER — RIVAROXABAN 20 MG PO TABS: 20.0000 mg | ORAL_TABLET | Freq: Every day | ORAL | 4 refills | Status: DC

## 2023-02-10 NOTE — Progress Notes (Signed)
   Care Guide Note  02/10/2023 Name: Harold Tate MRN: [id number] DOB: 1966-04-25  Referred by: Marjie Skiff, NP Reason for referral : Care Coordination (Outreach to schedule with Pharm d )   Harold Tate is a 57 y.o. year old male who is a primary care patient of Cannady, Dorie Rank, NP. Harold Tate was referred to the pharmacist for assistance related to HTN.    Successful contact was made with the patient to discuss pharmacy services including being ready for the pharmacist to call at least 5 minutes before the scheduled appointment time, to have medication bottles and any blood sugar or blood pressure readings ready for review. The patient agreed to meet with the pharmacist via with the pharmacist via telephone visit on (date/time).  02/13/2023  Penne Lash, RMA Care Guide [facility]  [place], [place] [address] Direct Dial: [phone] [email or web address]

## 2023-02-10 NOTE — Progress Notes (Signed)
BP 126/82 (BP Location: Left Arm)   Pulse 74   Ht 6\' 2"  (1.88 m)   Wt 209 lb (94.8 kg)   SpO2 95%   BMI 26.83 kg/m    Subjective:    Patient ID: Harold Tate, male    DOB: Aug 06, 1965, 57 y.o.   MRN: [id number]  HPI: Harold Tate is a 57 y.o. male  Chief Complaint  Patient presents with   Medical Management of Chronic Issues    Follow up from DVT   DVT LEFT LOWER LEG Was diagnosed in ER on 01/31/23 and started on Xarelto. Has had DVT's in past.  He is taking Xarelto at this time.  Had work-up by hematology in 2021 was negative hypercoagulable disease process -- they reported if recurrence he will need lifelong. Discussed with him today and he refuses to take lifelong as too costly.  Reports he has not started starter pack as had leftover Xarelto at home he is taking, states this prescription would not go through.  History of DVT right femoral vein 09/03/2019 + superficial femoral popliteal vein left leg 01/28/2014. Recurrent headaches: no Visual changes: no Palpitations: no Dyspnea: no Chest pain: no Lower extremity edema: no Dizzy/lightheaded: no   Relevant past medical, surgical, family and social history reviewed and updated as indicated. Interim medical history since our last visit reviewed. Allergies and medications reviewed and updated.  Review of Systems  Constitutional:  Negative for activity change, diaphoresis, fatigue and fever.  Respiratory:  Negative for cough, chest tightness, shortness of breath and wheezing.   Cardiovascular:  Negative for chest pain, palpitations and leg swelling.  Gastrointestinal: Negative.   Endocrine: Negative for cold intolerance, heat intolerance, polydipsia, polyphagia and polyuria.  Neurological: Negative.   Psychiatric/Behavioral: Negative.      Per HPI unless specifically indicated above     Objective:    BP 126/82 (BP Location: Left Arm)   Pulse 74   Ht 6\' 2"  (1.88 m)   Wt 209 lb (94.8 kg)   SpO2 95%   BMI 26.83  kg/m   Wt Readings from Last 3 Encounters:  02/10/23 209 lb (94.8 kg)  01/09/23 218 lb 6.4 oz (99.1 kg)  10/09/22 219 lb 6.4 oz (99.5 kg)    Physical Exam Vitals and nursing note reviewed.  Constitutional:      General: He is awake. He is not in acute distress.    Appearance: He is well-developed and well-groomed. He is not ill-appearing or toxic-appearing.  HENT:     Head: Normocephalic.     Right Ear: Hearing and external ear normal.     Left Ear: Hearing and external ear normal.  Eyes:     General: Lids are normal.     Extraocular Movements: Extraocular movements intact.     Conjunctiva/sclera: Conjunctivae normal.  Neck:     Thyroid: No thyromegaly.     Vascular: No carotid bruit.  Cardiovascular:     Rate and Rhythm: Normal rate and regular rhythm.     Pulses:          Popliteal pulses are 2+ on the right side and 2+ on the left side.       Dorsalis pedis pulses are 2+ on the right side and 2+ on the left side.     Heart sounds: Normal heart sounds. No murmur heard.    No gallop.  Pulmonary:     Effort: Pulmonary effort is normal. No accessory muscle usage or respiratory distress.  Breath sounds: Normal breath sounds.  Abdominal:     General: Bowel sounds are normal. There is no distension.     Palpations: Abdomen is soft.     Tenderness: There is no abdominal tenderness.  Musculoskeletal:     Cervical back: Full passive range of motion without pain.     Right lower leg: No edema.     Left lower leg: No edema.  Lymphadenopathy:     Cervical: No cervical adenopathy.  Skin:    General: Skin is warm.     Capillary Refill: Capillary refill takes less than 2 seconds.  Neurological:     Mental Status: He is alert and oriented to person, place, and time.     Deep Tendon Reflexes: Reflexes are normal and symmetric.     Reflex Scores:      Brachioradialis reflexes are 2+ on the right side and 2+ on the left side.      Patellar reflexes are 2+ on the right side and 2+  on the left side. Psychiatric:        Attention and Perception: Attention normal.        Mood and Affect: Mood normal.        Speech: Speech normal.        Behavior: Behavior normal. Behavior is cooperative.        Thought Content: Thought content normal.    Results for orders placed or performed in visit on 01/09/23  [id number] 11+Oxyco+Alc+Crt-Bund  Result Value Ref Range   Ethanol Negative Cutoff=0.020 %   Amphetamines, Urine Negative Cutoff=1000 ng/mL   Barbiturate Negative Cutoff=200 ng/mL   BENZODIAZ UR QL Negative Cutoff=200 ng/mL   Cannabinoid Quant, Ur Negative Cutoff=50 ng/mL   Cocaine (Metabolite) Negative Cutoff=300 ng/mL   OPIATE SCREEN URINE Negative Cutoff=300 ng/mL   Oxycodone/Oxymorphone, Urine Negative Cutoff=300 ng/mL   Phencyclidine Negative Cutoff=25 ng/mL   Methadone Screen, Urine Negative Cutoff=300 ng/mL   Propoxyphene Negative Cutoff=300 ng/mL   Meperidine Negative Cutoff=200 ng/mL   Tramadol Negative Cutoff=200 ng/mL   Creatinine 91.3 20.0 - 300.0 mg/dL   pH, Urine 5.3 4.5 - 8.9      Assessment & Plan:   Problem List Items Addressed This Visit       Cardiovascular and Mediastinum   Acute deep vein thrombosis (DVT) of popliteal vein of left lower extremity (HCC) - Primary    Acute, diagnosed 01/31/23.  Has not started Xarelto starter pack, but has been taking leftover Xarelto at home.  He is concerned with cost of Xarelto, urgent referral to [facility] pharmacist sent.  Dicussed at length with him he will need to be on lifelong anticoagulant now, this was recommended by hematology in the past.  He states "no way, not doing it".  Referral to vascular sent and refills on Xarelto to start daily dosing after completes starter pack.      Relevant Medications   rivaroxaban (XARELTO) 20 MG TABS tablet   Other Relevant Orders   Amb Referral to Clinical Pharmacist   Ambulatory referral to Vascular Surgery     Other   History of deep vein thrombosis (DVT) of lower  extremity    Refer to acute DVT plan of care.      Relevant Orders   Amb Referral to Clinical Pharmacist   Ambulatory referral to Vascular Surgery     Follow up plan: Return for As scheduled December 12th.

## 2023-02-10 NOTE — Telephone Encounter (Signed)
Wanting parking placards, will put this form in provider's folder to be filled out.  Advised will call patient when the form has been completed.

## 2023-02-10 NOTE — Assessment & Plan Note (Signed)
Acute, diagnosed 01/31/23.  Has not started Xarelto starter pack, but has been taking leftover Xarelto at home.  He is concerned with cost of Xarelto, urgent referral to [facility] pharmacist sent.  Dicussed at length with him he will need to be on lifelong anticoagulant now, this was recommended by hematology in the past.  He states "no way, not doing it".  Referral to vascular sent and refills on Xarelto to start daily dosing after completes starter pack.

## 2023-02-10 NOTE — Assessment & Plan Note (Signed)
Refer to acute DVT plan of care.

## 2023-02-13 ENCOUNTER — Other Ambulatory Visit: Payer: Self-pay

## 2023-02-13 NOTE — Progress Notes (Signed)
02/13/2023 Name: Harold Tate MRN: [id number] DOB: 25-Dec-1965  Chief Complaint  Patient presents with   Medication Assistance   Harold Tate is a 57 y.o. year old male who presented for a telephone visit.   They were referred to the pharmacist by their PCP for assistance in managing medication access.   Subjective:  Care Team: Primary Care Provider: Marjie Skiff, NP ; Next Scheduled Visit: 12/12  Medication Access/Adherence  Current Pharmacy:  [facility] - [place], [place] - [address] [place] [address] Phone: [phone] Fax: [phone]  -Patient reports affordability concerns with their medications: Yes  -Patient reports access/transportation concerns to their pharmacy: No  -Patient reports adherence concerns with their medications:  Yes    Medication Management: -Patient has been prescribed Xarelto for long-term use based on PMH (+) DVT/PE -He was able to pick up a 30 day supply at [facility] by using a free trial coupon -Prescription was filled for 20mg  daily versus the Xarelto starter pack, but he has been taking this for a few days now -He is hesitant to continue therapy based on affordability -Upon further review, patient has managed Medicaid plan that should cover Xarelto -Addressed statin use, because it does not appear Ms. Sollars is not adherent to his rosuvastatin 5mg - he states the medication makes him feel lethargic, and he would like to discuss alternatives  Objective: Lab Results  Component Value Date   HGBA1C 6.7 (H) 10/09/2022   Lab Results  Component Value Date   CREATININE 0.81 10/09/2022   BUN 5 (L) 10/09/2022   NA 140 10/09/2022   K 4.4 10/09/2022   CL 104 10/09/2022   CO2 21 10/09/2022   Lab Results  Component Value Date   CHOL 211 (H) 10/09/2022   HDL 43 10/09/2022   LDLCALC 138 (H) 10/09/2022   TRIG 166 (H) 10/09/2022   CHOLHDL 3.5 01/18/2021   Medications Reviewed Today     Reviewed by  Lenna Gilford, RPH (Pharmacist) on 02/13/23 at 1555  Med List Status: <None>   Medication Order Taking? Sig Documenting Provider Last Dose Status Informant  baclofen (LIORESAL) 10 MG tablet [id number] Yes Take 10 mg by mouth 3 (three) times daily. [provider] Taking Active   clonazePAM (KLONOPIN) 0.5 MG tablet [id number] Yes Take 1 tablet (0.5 mg total) by mouth 3 (three) times daily as needed for anxiety. Aura Dials T, NP Taking Active   rivaroxaban (XARELTO) 20 MG TABS tablet [id number] Yes Take 1 tablet (20 mg total) by mouth daily with supper. DO NOT START until finished 21 day starter pack. Marjie Skiff, NP Taking Active   RIVAROXABAN Carlena Hurl) VTE STARTER PACK (15 & 20 MG) [id number] No Follow package directions: Take one 15mg  tablet by mouth twice a day. On day 22, switch to one 20mg  tablet once a day. Take with food.  Patient not taking: Reported on 02/13/2023   Jene Every, MD Not Taking Active   rosuvastatin (CRESTOR) 5 MG tablet [id number] No Take 1 tablet (5 mg total) by mouth daily.  Patient not taking: Reported on 02/13/2023   Aura Dials T, NP Not Taking Active   sildenafil (VIAGRA) 100 MG tablet [id number] No Take 0.5-1 tablets (50-100 mg total) by mouth daily as needed for erectile dysfunction. Loura Pardon, MD Unknown Active            Assessment/Plan:   Medication Management: - Contacted pharmacy, and they did  not have patient's Medicaid plan on file.  Provided this information; and they were able to bill Xarelto to his commercial plan and Medicaid for a $4 copay - Informed patient of this and suggested anytime a medication is >$4, he verify the pharmacy is billing both insurance plans - Follow-up appointment scheduled to discuss alternatives for HLD control  Follow Up Plan: 10/21 to discuss alternative statins  Lenna Gilford, PharmD, DPLA

## 2023-02-17 ENCOUNTER — Other Ambulatory Visit: Payer: Self-pay

## 2023-02-17 DIAGNOSIS — Z599 Problem related to housing and economic circumstances, unspecified: Secondary | ICD-10-CM

## 2023-02-17 NOTE — Progress Notes (Signed)
   02/17/2023  Patient ID: Harold Tate, male   DOB: June 05, 1965, 57 y.o.   MRN: [id number]  S/O Patient outreach to follow-up on statin use  HLD -Patient prescribed rosuvastatin 5mg  daily, but he endorses he is not taking based on lethargy caused by the medication -He is asking about trying ezetimibe 10mg  daily for cholesterol control -Last lipid panel in June 2024:  TC- 211, TG- 166, HDL- 43, LDL- 138  A/P  HLD -Not controlled -Unlikely that monotherapy with ezetimibe 10mg  daily will get patient to goal, but I recommend trial of medication versus not taking anything at all -Patient sees Jolene again 12/12; recommend follow-up lipid panel at that time (maximum effect of ezetimibe seen usually by week 4 of therapy) -If patient still not wnl, could revisit statin therapy with a different drug; or even at intermittent dosing  Follow-up:  Will discuss HLD plan with patient after consulting PCP  Lenna Gilford, PharmD, DPLA

## 2023-02-18 NOTE — Addendum Note (Signed)
Addended by: Aura Dials T on: 02/18/2023 03:04 PM   Modules accepted: Orders

## 2023-03-11 NOTE — Telephone Encounter (Signed)
Created in error

## 2023-03-20 DIAGNOSIS — L03012 Cellulitis of left finger: Secondary | ICD-10-CM | POA: Diagnosis not present

## 2023-03-20 DIAGNOSIS — M19042 Primary osteoarthritis, left hand: Secondary | ICD-10-CM | POA: Diagnosis not present

## 2023-04-06 NOTE — Patient Instructions (Incomplete)

## 2023-04-10 ENCOUNTER — Ambulatory Visit: Payer: 59 | Admitting: Nurse Practitioner

## 2023-04-10 DIAGNOSIS — G114 Hereditary spastic paraplegia: Secondary | ICD-10-CM

## 2023-04-10 DIAGNOSIS — E1169 Type 2 diabetes mellitus with other specified complication: Secondary | ICD-10-CM

## 2023-04-10 DIAGNOSIS — F321 Major depressive disorder, single episode, moderate: Secondary | ICD-10-CM

## 2023-04-10 DIAGNOSIS — E1159 Type 2 diabetes mellitus with other circulatory complications: Secondary | ICD-10-CM

## 2023-04-10 DIAGNOSIS — K7469 Other cirrhosis of liver: Secondary | ICD-10-CM

## 2023-04-10 DIAGNOSIS — F17210 Nicotine dependence, cigarettes, uncomplicated: Secondary | ICD-10-CM

## 2023-04-10 DIAGNOSIS — I82532 Chronic embolism and thrombosis of left popliteal vein: Secondary | ICD-10-CM

## 2023-04-10 DIAGNOSIS — N400 Enlarged prostate without lower urinary tract symptoms: Secondary | ICD-10-CM

## 2023-04-10 DIAGNOSIS — E538 Deficiency of other specified B group vitamins: Secondary | ICD-10-CM

## 2023-04-10 DIAGNOSIS — F411 Generalized anxiety disorder: Secondary | ICD-10-CM

## 2023-04-10 DIAGNOSIS — Z79899 Other long term (current) drug therapy: Secondary | ICD-10-CM

## 2023-04-24 ENCOUNTER — Telehealth: Payer: Self-pay | Admitting: Nurse Practitioner

## 2023-04-24 ENCOUNTER — Other Ambulatory Visit: Payer: Self-pay | Admitting: Nurse Practitioner

## 2023-04-24 DIAGNOSIS — F411 Generalized anxiety disorder: Secondary | ICD-10-CM

## 2023-04-24 NOTE — Telephone Encounter (Signed)
From Cheryl's October note:  HLD -Patient prescribed rosuvastatin 5mg  daily, but he endorses he is not taking based on lethargy caused by the medication -He is asking about trying ezetimibe 10mg  daily for cholesterol control -Last lipid panel in June 2024:  TC- 211, TG- 166, HDL- 43, LDL- 138

## 2023-04-24 NOTE — Telephone Encounter (Signed)
Called and notified patient that this medication would be discussed at his next visit with Jolene.

## 2023-04-24 NOTE — Telephone Encounter (Signed)
Copied from CRM [id number]. Topic: General - Other >> Apr 24, 2023 10:28 AM [place] B wrote: Reason for CRM: pt called in about med,  ezetimibe 10mg  , and switching to something. He spoke with cheryl gentry about this in October. Please cb for further discussion

## 2023-04-24 NOTE — Telephone Encounter (Signed)
Routing to provider and [place]. Was the patient supposed to be started on this medication? Or was it going to be discussed with the patient at his next appointment?

## 2023-04-25 MED ORDER — EZETIMIBE 10 MG PO TABS: 10.0000 mg | ORAL_TABLET | Freq: Every day | ORAL | 3 refills | Status: DC

## 2023-04-25 NOTE — Addendum Note (Signed)
Addended by: Aura Dials T on: 04/25/2023 12:42 PM   Modules accepted: Orders

## 2023-04-25 NOTE — Telephone Encounter (Signed)
Requested medication (s) are due for refill today: yes  Requested medication (s) are on the active medication list: yes  Last refill:  01/09/23 #90 2 RF  Future visit scheduled: yes  Notes to clinic:  med not delegated to NT to RF   Requested Prescriptions  Pending Prescriptions Disp Refills   clonazePAM (KLONOPIN) 0.5 MG tablet [Pharmacy Med Name: CLONAZEPAM 0.5 MG TABLET] 90 tablet 0    Sig: Take 1 tablet (0.5 mg total)by mouth 3 (three) times daily as needed for anxiety.     Not Delegated - Psychiatry: Anxiolytics/Hypnotics 2 Failed - 04/25/2023  3:06 PM      Failed - This refill cannot be delegated      Passed - Urine Drug Screen completed in last 360 days      Passed - Patient is not pregnant      Passed - Valid encounter within last 6 months    Recent Outpatient Visits           2 months ago Acute deep vein thrombosis (DVT) of popliteal vein of left lower extremity (HCC)   [place] [facility] [place], [place] T, NP   3 months ago Depression, major, single episode, moderate (HCC)   [place] [facility] [place], [place] T, NP   6 months ago Type 2 diabetes mellitus with obesity (HCC)   [place] [facility] [place], [place] T, NP   9 months ago Depression, major, single episode, moderate (HCC)   [place] [facility] [place], Corrie Dandy T, NP   1 year ago Type 2 diabetes mellitus with obesity (HCC)   [place] [facility] [place], Dorie Rank, NP       Future Appointments             In 1 week Cannady, Dorie Rank, NP [place] [facility]

## 2023-05-07 ENCOUNTER — Ambulatory Visit: Payer: 59 | Admitting: Nurse Practitioner

## 2023-05-07 DIAGNOSIS — Z79899 Other long term (current) drug therapy: Secondary | ICD-10-CM

## 2023-05-07 DIAGNOSIS — G114 Hereditary spastic paraplegia: Secondary | ICD-10-CM

## 2023-05-07 DIAGNOSIS — I700 Atherosclerosis of aorta: Secondary | ICD-10-CM

## 2023-05-07 DIAGNOSIS — E538 Deficiency of other specified B group vitamins: Secondary | ICD-10-CM

## 2023-05-07 DIAGNOSIS — F411 Generalized anxiety disorder: Secondary | ICD-10-CM

## 2023-05-07 DIAGNOSIS — E1169 Type 2 diabetes mellitus with other specified complication: Secondary | ICD-10-CM

## 2023-05-07 DIAGNOSIS — N400 Enlarged prostate without lower urinary tract symptoms: Secondary | ICD-10-CM

## 2023-05-07 DIAGNOSIS — E1159 Type 2 diabetes mellitus with other circulatory complications: Secondary | ICD-10-CM

## 2023-05-07 DIAGNOSIS — I82532 Chronic embolism and thrombosis of left popliteal vein: Secondary | ICD-10-CM

## 2023-05-07 DIAGNOSIS — D696 Thrombocytopenia, unspecified: Secondary | ICD-10-CM

## 2023-05-07 DIAGNOSIS — K7469 Other cirrhosis of liver: Secondary | ICD-10-CM

## 2023-05-07 DIAGNOSIS — F17210 Nicotine dependence, cigarettes, uncomplicated: Secondary | ICD-10-CM

## 2023-05-07 DIAGNOSIS — F321 Major depressive disorder, single episode, moderate: Secondary | ICD-10-CM

## 2023-05-21 ENCOUNTER — Ambulatory Visit (INDEPENDENT_AMBULATORY_CARE_PROVIDER_SITE_OTHER): Payer: 59 | Admitting: Nurse Practitioner

## 2023-05-21 ENCOUNTER — Ambulatory Visit: Payer: 59 | Admitting: Nurse Practitioner

## 2023-05-21 ENCOUNTER — Encounter: Payer: Self-pay | Admitting: Nurse Practitioner

## 2023-05-21 VITALS — BP 128/78 | HR 82 | Temp 97.4°F | Ht 74.0 in | Wt 198.4 lb

## 2023-05-21 DIAGNOSIS — I700 Atherosclerosis of aorta: Secondary | ICD-10-CM | POA: Diagnosis not present

## 2023-05-21 DIAGNOSIS — I82532 Chronic embolism and thrombosis of left popliteal vein: Secondary | ICD-10-CM | POA: Diagnosis not present

## 2023-05-21 DIAGNOSIS — F321 Major depressive disorder, single episode, moderate: Secondary | ICD-10-CM

## 2023-05-21 DIAGNOSIS — Z79899 Other long term (current) drug therapy: Secondary | ICD-10-CM

## 2023-05-21 DIAGNOSIS — E785 Hyperlipidemia, unspecified: Secondary | ICD-10-CM | POA: Diagnosis not present

## 2023-05-21 DIAGNOSIS — I152 Hypertension secondary to endocrine disorders: Secondary | ICD-10-CM | POA: Diagnosis not present

## 2023-05-21 DIAGNOSIS — E538 Deficiency of other specified B group vitamins: Secondary | ICD-10-CM

## 2023-05-21 DIAGNOSIS — E1169 Type 2 diabetes mellitus with other specified complication: Secondary | ICD-10-CM

## 2023-05-21 DIAGNOSIS — G114 Hereditary spastic paraplegia: Secondary | ICD-10-CM | POA: Diagnosis not present

## 2023-05-21 DIAGNOSIS — F17210 Nicotine dependence, cigarettes, uncomplicated: Secondary | ICD-10-CM | POA: Diagnosis not present

## 2023-05-21 DIAGNOSIS — E1159 Type 2 diabetes mellitus with other circulatory complications: Secondary | ICD-10-CM | POA: Diagnosis not present

## 2023-05-21 DIAGNOSIS — N400 Enlarged prostate without lower urinary tract symptoms: Secondary | ICD-10-CM | POA: Diagnosis not present

## 2023-05-21 DIAGNOSIS — D696 Thrombocytopenia, unspecified: Secondary | ICD-10-CM

## 2023-05-21 DIAGNOSIS — F411 Generalized anxiety disorder: Secondary | ICD-10-CM

## 2023-05-21 DIAGNOSIS — K7469 Other cirrhosis of liver: Secondary | ICD-10-CM | POA: Diagnosis not present

## 2023-05-21 DIAGNOSIS — E669 Obesity, unspecified: Secondary | ICD-10-CM | POA: Diagnosis not present

## 2023-05-21 LAB — MICROALBUMIN, URINE WAIVED
Creatinine, Urine Waived: 100 mg/dL (ref 10–300)
Microalb, Ur Waived: 150 mg/L — ABNORMAL HIGH (ref 0–19)

## 2023-05-21 LAB — BAYER DCA HB A1C WAIVED: HB A1C (BAYER DCA - WAIVED): 13.4 % — ABNORMAL HIGH (ref 4.8–5.6)

## 2023-05-21 MED ORDER — CLONAZEPAM 0.5 MG PO TABS: 0.5000 mg | ORAL_TABLET | Freq: Three times a day (TID) | ORAL | 0 refills | Status: DC | PRN

## 2023-05-21 MED ORDER — DAPAGLIFLOZIN PROPANEDIOL 10 MG PO TABS: 10.0000 mg | ORAL_TABLET | Freq: Every day | ORAL | 0 refills | Status: DC

## 2023-05-21 NOTE — Assessment & Plan Note (Signed)
Chronic, ongoing.  Recommend he start Zetia at this time, discussed at length.  Lipid panel today.

## 2023-05-21 NOTE — Assessment & Plan Note (Signed)
Refer to anxiety plan of care. 

## 2023-05-21 NOTE — Assessment & Plan Note (Signed)
I have recommended complete cessation of tobacco use. I have discussed various options available for assistance with tobacco cessation including over the counter methods (Nicotine gum, patch and lozenges). We also discussed prescription options (Chantix, Nicotine Inhaler / Nasal Spray). The patient is not interested in pursuing any prescription tobacco cessation options at this time.  Recommend he return to lung cancer screening, referral placed today.

## 2023-05-21 NOTE — Assessment & Plan Note (Signed)
Chronic.  Have advised him to continue supplement daily.  Check level today.

## 2023-05-21 NOTE — Assessment & Plan Note (Addendum)
Chronic, stable without medications.  BP at goal in office today.  Recommend he monitor BP at least a few mornings a week at home and document.  DASH diet at home.  Continue current medication regimen and adjust as needed.  Urine ALB 150 January 2025, refuses ACE/ARB. Labs today: CMP, TSH, and Lipid.

## 2023-05-21 NOTE — Assessment & Plan Note (Signed)
Chronic, noted on past CT scan.  Recommend he consistently take Zetia as ordered and cut back on smoking, working towards cessation as goal.

## 2023-05-21 NOTE — Assessment & Plan Note (Signed)
Chronic, progressive.  Continue collaboration with [facility].  Baclofen for discomfort as needed. Recommend referral to PT, referral last visit and has not attended.

## 2023-05-21 NOTE — Assessment & Plan Note (Signed)
Chronic, ongoing.  Denies SI/HI.   Refills sent on Klonopin, refer to anxiety plan.  Have recommended psychiatry visit, he refuses.  Has failed multiple maintenance medications. 

## 2023-05-21 NOTE — Assessment & Plan Note (Signed)
Chronic, stable.  Followed by GI with recent imaging remaining stable.  He denies alcohol use.  Continue collaboration with GI. 

## 2023-05-21 NOTE — Patient Instructions (Signed)

## 2023-05-21 NOTE — Assessment & Plan Note (Addendum)
Chronic, with elevation this check due to poor diet with lots of sweets recently.  A1c 13.4% today, highest it has ever been for this provider, due to poor diet with more sugar.  He has been very stable since 2022, with A1c 5-6%.  Will restart Farxiga at this time, which he took in past, and he will work on diet changes.  Suspect we will be able to stop medication again in future.  Recommend he check blood sugar daily.  Discussed with him risks of high sugar levels.  Urine ALB 150 January 2025, refuses ACE/ARB. - Foot exam up to date.  Needs eye exam. - Refuses all vaccines - Zetia start, refuses ACE/ARB.

## 2023-05-21 NOTE — Assessment & Plan Note (Signed)
Chronic, ongoing.  Has been on benzo long term started by his previous PCP.  Discussed at length risks of long term benzo use, including risk for dementia.  He is fully aware of risks and wishes to continue, refuses psychiatry visit.  Refill sent #90 plus 0 refills.  Have HIGHLY recommend he not take more then ordered and that he trial a reduction of it, trial BID dosing.  Will have return to office in 3 months.  UDS today and due next 01/09/24, contract up to date.

## 2023-05-21 NOTE — Assessment & Plan Note (Signed)
Chronic, history of similar.  Dicussed at length with him he will need to be on lifelong anticoagulant now, this was recommended by hematology in the past.  Working with PharmD Elnita Maxwell on cost assist with DOAC.  Taking Xarelto.  Referral to vascular last visit, has not attended.  Recommend complete cessation of smoking.

## 2023-05-21 NOTE — Progress Notes (Addendum)
BP 128/78   Pulse 82   Temp (!) 97.4 F (36.3 C) (Oral)   Ht 6\' 2"  (1.88 m)   Wt 198 lb 6.4 oz (90 kg)   BMI 25.47 kg/m    Subjective:    Patient ID: Harold Tate, male    DOB: [birth year], 58 y.o.   MRN: [id number]  HPI: Harold Tate is a 58 y.o. male  Chief Complaint  Patient presents with   Depression   Diabetes    No recent eye exam per patient   Hyperlipidemia   Hypertension   DIABETES Last A1c June 6.7%.  Has been off medications >1 year with A1c in 5-6 range. However, has been eating ice cream, sweet tea, and drinking more Coca Cola -- he had stopped this.  Feels like this level may be up due to more fatigue and peeing more.  Has not been above goal since 04/26/2021 when was 9.1%. Hypoglycemic episodes:no Polydipsia/polyuria: yes Visual disturbance: no Chest pain: no Paresthesias: no Glucose Monitoring: no  Accucheck frequency: Not Checking  Fasting glucose:  Post prandial:  Evening:  Before meals: Taking Insulin?: no  Long acting insulin:  Short acting insulin: Blood Pressure Monitoring: not checking Retinal Examination: Not up to Date Foot Exam: Up to Date Pneumovax: Not up to Date -- refuses Influenza: Not up to Date -- refuses Aspirin: no   HYPERTENSION / HYPERLIPIDEMIA/ CIRRHOSIS + DVT No current medication for BP and refuses statin therapy, took Rosuvastatin -- has Zetia and will start this. Aortic atherosclerosis noted on imaging 12/27/20. Recent DVT 01/31/23 - history of same in past with negative hematology work-up in 2021.  To be on lifelong Xarelto, [facility] helping with assistance for this.  Follows with [facility] neurology for hereditary spastic paraparesis neurology on 09/27/22. They ordered a sleep study+ are concerned that Baclofen and Klonopin together cause his increased fatigue at times.   Follows with GI, last visit 07/24/21, ultrasound on 09/16/22 was stable, is to have repeat in 6 months.  No heavy alcohol use, does not currently use any  alcohol.  Continues to smoke, smokes about 1 PPD, has cut back. Smoked since he was 58 years old.   Satisfied with current treatment? yes Duration of hyperlipidemia: chronic Cholesterol medication side effects: no Cholesterol supplements: none Medication compliance: good compliance Aspirin: no Recent stressors: no Recurrent headaches: no Visual changes: no Palpitations: no Dyspnea: no Chest pain: no Lower extremity edema: no Dizzy/lightheaded: no   ANXIETY/STRESS Is taking Clonazepam 0.5 MG TID ordered by previous PCP, Dr. Dossie Arbour.  Has been on this for several years, used to be Xanax but was changed as anxiety became worse with this. Pt is aware of risks of benzo medication use to include increased sedation, respiratory suppression, falls, extrapyramidal movements, dependence and cardiovascular events. Pt would like to continue treatment as benefit determined to outweigh risk.    Has tried Wellbutrin, Celexa, and Prozac in past without success. Wellbutrin caused weird thoughts. PDMP last Klonopin fill was 04/25/23, no other recent controlled substance fills.   Duration:stable Anxious mood: yes  Excessive worrying: no Irritability: no  Sweating: no Nausea: no Palpitations:no Hyperventilation: no Panic attacks: no Agoraphobia: no  Obscessions/compulsions: no Depressed mood: no    05/21/2023    3:04 PM 02/10/2023   10:27 AM 01/09/2023    9:08 AM 10/09/2022    8:59 AM 07/09/2022   11:19 AM  Depression screen PHQ 2/9  Decreased Interest 1 0 0 0 1  Down, Depressed,  Hopeless 0 0 0 0 1  PHQ - 2 Score 1 0 0 0 2  Altered sleeping 0 0 0 0 0  Tired, decreased energy 3 0 0 0 2  Change in appetite 0 3 0 0 0  Feeling bad or failure about yourself  1 0 0 0 0  Trouble concentrating 0 0 0 0 0  Moving slowly or fidgety/restless 0 0 0 0 0  Suicidal thoughts 0 0 0 0 0  PHQ-9 Score 5 3 0 0 4  Difficult doing work/chores Somewhat difficult  Not difficult at all  Not difficult at all   Anhedonia: no Weight changes: no Insomnia: none Hypersomnia: no Fatigue/loss of energy: no Feelings of worthlessness: no Feelings of guilt: no Impaired concentration/indecisiveness: no Suicidal ideations: no  Crying spells: no Recent Stressors/Life Changes: no   Relationship problems: no   Family stress: no     Financial stress: no    Job stress: no    Recent death/loss: no     06-11-2023    3:05 PM 02/10/2023   10:28 AM 01/09/2023    9:10 AM 07/09/2022   11:19 AM  GAD 7 : Generalized Anxiety Score  Nervous, Anxious, on Edge 3 3 3 3   Control/stop worrying 1 0 2 2  Worry too much - different things 3 0 2 1  Trouble relaxing 0 0 0 0  Restless 0 0 0 0  Easily annoyed or irritable 0 0 0 0  Afraid - awful might happen 1 0 0 0  Total GAD 7 Score 8 3 7 6   Anxiety Difficulty Somewhat difficult  Not difficult at all Not difficult at all   Relevant past medical, surgical, family and social history reviewed and updated as indicated. Interim medical history since our last visit reviewed. Allergies and medications reviewed and updated.  Review of Systems  Constitutional:  Positive for fatigue. Negative for activity change, diaphoresis and fever.  Respiratory:  Negative for cough, chest tightness, shortness of breath and wheezing.   Cardiovascular:  Negative for chest pain, palpitations and leg swelling.  Gastrointestinal: Negative.   Endocrine: Positive for polydipsia and polyuria. Negative for cold intolerance, heat intolerance and polyphagia.  Neurological: Negative.   Psychiatric/Behavioral: Negative.     Per HPI unless specifically indicated above     Objective:    BP 128/78   Pulse 82   Temp (!) 97.4 F (36.3 C) (Oral)   Ht 6\' 2"  (1.88 m)   Wt 198 lb 6.4 oz (90 kg)   BMI 25.47 kg/m   Wt Readings from Last 3 Encounters:  Jun 11, 2023 198 lb 6.4 oz (90 kg)  02/10/23 209 lb (94.8 kg)  01/09/23 218 lb 6.4 oz (99.1 kg)    Physical Exam Vitals and nursing note reviewed.   Constitutional:      General: He is awake. He is not in acute distress.    Appearance: He is well-developed and well-groomed. He is not ill-appearing or toxic-appearing.  HENT:     Head: Normocephalic.     Right Ear: Hearing and external ear normal.     Left Ear: Hearing and external ear normal.  Eyes:     General: Lids are normal.     Extraocular Movements: Extraocular movements intact.     Conjunctiva/sclera: Conjunctivae normal.  Neck:     Thyroid: No thyromegaly.     Vascular: No carotid bruit.  Cardiovascular:     Rate and Rhythm: Normal rate and regular rhythm.  Heart sounds: Normal heart sounds. No murmur heard.    No gallop.  Pulmonary:     Effort: No accessory muscle usage or respiratory distress.     Breath sounds: Normal breath sounds.  Abdominal:     General: Bowel sounds are normal. There is no distension.     Palpations: Abdomen is soft.     Tenderness: There is no abdominal tenderness.  Musculoskeletal:     Cervical back: Full passive range of motion without pain.     Right lower leg: No edema.     Left lower leg: No edema.  Lymphadenopathy:     Cervical: No cervical adenopathy.  Skin:    General: Skin is warm.     Capillary Refill: Capillary refill takes less than 2 seconds.  Neurological:     Mental Status: He is alert and oriented to person, place, and time.     Deep Tendon Reflexes: Reflexes are normal and symmetric.     Reflex Scores:      Brachioradialis reflexes are 2+ on the right side and 2+ on the left side.      Patellar reflexes are 2+ on the right side and 2+ on the left side. Psychiatric:        Attention and Perception: Attention normal.        Mood and Affect: Mood normal.        Speech: Speech normal.        Behavior: Behavior normal. Behavior is cooperative.        Thought Content: Thought content normal.    Diabetic Foot Exam - Simple   Simple Foot Form Visual Inspection No deformities, no ulcerations, no other skin breakdown  bilaterally: Yes Sensation Testing Intact to touch and monofilament testing bilaterally: Yes Pulse Check Posterior Tibialis and Dorsalis pulse intact bilaterally: Yes Comments     Results for orders placed or performed in visit on 05/21/23  Bayer DCA Hb A1c Waived   Collection Time: 05/21/23  3:03 PM  Result Value Ref Range   HB A1C (BAYER DCA - WAIVED) 13.4 (H) 4.8 - 5.6 %  Microalbumin, Urine Waived   Collection Time: 05/21/23  3:03 PM  Result Value Ref Range   Microalb, Ur Waived 150 (H) 0 - 19 mg/L   Creatinine, Urine Waived 100 10 - 300 mg/dL   Microalb/Creat Ratio 30-300 (H) <30 mg/g      Assessment & Plan:   Problem List Items Addressed This Visit       Cardiovascular and Mediastinum   Aortic atherosclerosis (HCC)   Chronic, noted on past CT scan.  Recommend he consistently take Zetia as ordered and cut back on smoking, working towards cessation as goal.      Chronic deep vein thrombosis (DVT) of popliteal vein of left lower extremity (HCC)   Chronic, history of similar.  Dicussed at length with him he will need to be on lifelong anticoagulant now, this was recommended by hematology in the past.  Working with PharmD Elnita Maxwell on cost assist with DOAC.  Taking Xarelto.  Referral to vascular last visit, has not attended.  Recommend complete cessation of smoking.      Hypertension associated with diabetes (HCC)   Chronic, stable without medications.  BP at goal in office today.  Recommend he monitor BP at least a few mornings a week at home and document.  DASH diet at home.  Continue current medication regimen and adjust as needed.  Urine ALB 150 January 2025, refuses ACE/ARB.  Labs today: CMP, TSH, and Lipid.         Relevant Medications   dapagliflozin propanediol (FARXIGA) 10 MG TABS tablet   Other Relevant Orders   Bayer DCA Hb A1c Waived (Completed)   CBC with Differential/Platelet   TSH     Digestive   Cirrhosis of liver (HCC)   Chronic, stable.  Followed by GI  with recent imaging remaining stable.  He denies alcohol use.  Continue collaboration with GI.        Endocrine   Hyperlipidemia associated with type 2 diabetes mellitus (HCC)   Chronic, ongoing.  Recommend he start Zetia at this time, discussed at length.  Lipid panel today.      Relevant Medications   dapagliflozin propanediol (FARXIGA) 10 MG TABS tablet   Other Relevant Orders   Comprehensive metabolic panel   Lipid Panel w/o Chol/HDL Ratio   Type 2 diabetes mellitus with obesity (HCC) - Primary   Chronic, with elevation this check due to poor diet with lots of sweets recently.  A1c 13.4% today, highest it has ever been for this provider, due to poor diet with more sugar.  He has been very stable since 2022, with A1c 5-6%.  Will restart Farxiga at this time, which he took in past, and he will work on diet changes.  Suspect we will be able to stop medication again in future.  Recommend he check blood sugar daily.  Discussed with him risks of high sugar levels.  Urine ALB 150 January 2025, refuses ACE/ARB. - Foot exam up to date.  Needs eye exam. - Refuses all vaccines - Zetia start, refuses ACE/ARB.      Relevant Medications   dapagliflozin propanediol (FARXIGA) 10 MG TABS tablet   Other Relevant Orders   Bayer DCA Hb A1c Waived (Completed)   Microalbumin, Urine Waived (Completed)     Nervous and Auditory   Hereditary spastic paraparesis (HCC)   Chronic, progressive.  Continue collaboration with [facility].  Baclofen for discomfort as needed. Recommend referral to PT, referral last visit and has not attended.          Other   Generalized anxiety disorder (Chronic)   Chronic, ongoing.  Has been on benzo long term started by his previous PCP.  Discussed at length risks of long term benzo use, including risk for dementia.  He is fully aware of risks and wishes to continue, refuses psychiatry visit.  Refill sent #90 plus 0 refills.  Have HIGHLY recommend he not take more then ordered and  that he trial a reduction of it, trial BID dosing.  Will have return to office in 3 months.  UDS today and due next 01/09/24, contract up to date.      Relevant Medications   clonazePAM (KLONOPIN) 0.5 MG tablet   B12 deficiency   Chronic.  Have advised him to continue supplement daily.  Check level today.      Relevant Orders   Vitamin B12   Depression, major, single episode, moderate (HCC)   Chronic, ongoing.  Denies SI/HI.   Refills sent on Klonopin, refer to anxiety plan.  Have recommended psychiatry visit, he refuses.  Has failed multiple maintenance medications.      Long-term current use of benzodiazepine   Refer to anxiety plan of care.      Nicotine dependence, cigarettes, uncomplicated   I have recommended complete cessation of tobacco use. I have discussed various options available for assistance with tobacco cessation including over the counter methods (  Nicotine gum, patch and lozenges). We also discussed prescription options (Chantix, Nicotine Inhaler / Nasal Spray). The patient is not interested in pursuing any prescription tobacco cessation options at this time.  Recommend he return to lung cancer screening, referral placed today.       Other Visit Diagnoses       Benign prostatic hyperplasia without lower urinary tract symptoms       PSA on labs today   Relevant Orders   PSA        Follow up plan: Return in about 3 months (around 08/19/2023) for T2DM, HTN/HLD.

## 2023-05-22 LAB — CBC WITH DIFFERENTIAL/PLATELET
Basophils Absolute: 0.1 10*3/uL (ref 0.0–0.2)
Basos: 1 %
EOS (ABSOLUTE): 0.1 10*3/uL (ref 0.0–0.4)
Eos: 2 %
Hematocrit: 47.2 % (ref 37.5–51.0)
Hemoglobin: 15.8 g/dL (ref 13.0–17.7)
Immature Grans (Abs): 0 10*3/uL (ref 0.0–0.1)
Immature Granulocytes: 0 %
Lymphocytes Absolute: 2 10*3/uL (ref 0.7–3.1)
Lymphs: 27 %
MCH: 32.1 pg (ref 26.6–33.0)
MCHC: 33.5 g/dL (ref 31.5–35.7)
MCV: 96 fL (ref 79–97)
Monocytes Absolute: 0.4 10*3/uL (ref 0.1–0.9)
Monocytes: 6 %
Neutrophils Absolute: 4.6 10*3/uL (ref 1.4–7.0)
Neutrophils: 64 %
Platelets: 145 10*3/uL — ABNORMAL LOW (ref 150–450)
RBC: 4.92 x10E6/uL (ref 4.14–5.80)
RDW: 11.9 % (ref 11.6–15.4)
WBC: 7.1 10*3/uL (ref 3.4–10.8)

## 2023-05-22 LAB — COMPREHENSIVE METABOLIC PANEL
ALT: 17 [IU]/L (ref 0–44)
AST: 11 [IU]/L (ref 0–40)
Albumin: 4.2 g/dL (ref 3.8–4.9)
Alkaline Phosphatase: 140 [IU]/L — ABNORMAL HIGH (ref 44–121)
BUN/Creatinine Ratio: 9 (ref 9–20)
BUN: 7 mg/dL (ref 6–24)
Bilirubin Total: 0.9 mg/dL (ref 0.0–1.2)
CO2: 26 mmol/L (ref 20–29)
Calcium: 9.5 mg/dL (ref 8.7–10.2)
Chloride: 95 mmol/L — ABNORMAL LOW (ref 96–106)
Creatinine, Ser: 0.82 mg/dL (ref 0.76–1.27)
Globulin, Total: 2.7 g/dL (ref 1.5–4.5)
Glucose: 333 mg/dL — ABNORMAL HIGH (ref 70–99)
Potassium: 4.3 mmol/L (ref 3.5–5.2)
Sodium: 135 mmol/L (ref 134–144)
Total Protein: 6.9 g/dL (ref 6.0–8.5)
eGFR: 102 mL/min/{1.73_m2} (ref >59)

## 2023-05-22 LAB — LIPID PANEL W/O CHOL/HDL RATIO
Cholesterol, Total: 181 mg/dL (ref 100–199)
HDL: 41 mg/dL (ref >39)
LDL Chol Calc (NIH): 106 mg/dL — ABNORMAL HIGH (ref 0–99)
Triglycerides: 198 mg/dL — ABNORMAL HIGH (ref 0–149)
VLDL Cholesterol Cal: 34 mg/dL (ref 5–40)

## 2023-05-22 LAB — VITAMIN B12: Vitamin B-12: 511 pg/mL (ref 232–1245)

## 2023-05-22 LAB — PSA: Prostate Specific Ag, Serum: 0.4 ng/mL (ref 0.0–4.0)

## 2023-05-22 LAB — TSH: TSH: 1.2 u[IU]/mL (ref 0.450–4.500)

## 2023-05-22 NOTE — Progress Notes (Signed)
Good morning, please call Geronimo with lab results as he has not checked MyChart since October 2023:) Good day Eraclio, your labs have returned: - Blood counts show no anemia or infection.  Platelets remain a little on low side but stable.  We will continue to monitor. - Kidney and liver function are normal.  Your sugar was high at 333, which goes along with the elevation in A1c we saw. - Lipid panel shows LDL, bad cholesterol, above goal. Please start Zetia that was sent in. - Remainder of labs are stable.  Ensure you start Marcelline Deist as we discussed and focus heavily on diet changes for diabetes.  Any questions? Keep being stable!!  Thank you for allowing me to participate in your care.  I appreciate you. Kindest regards, Tavin Vernet

## 2023-05-24 ENCOUNTER — Other Ambulatory Visit: Payer: Self-pay | Admitting: Nurse Practitioner

## 2023-05-26 MED ORDER — EMPAGLIFLOZIN 25 MG PO TABS: 25.0000 mg | ORAL_TABLET | Freq: Every day | ORAL | 12 refills | Status: DC

## 2023-05-26 NOTE — Telephone Encounter (Signed)
Requested Prescriptions  Pending Prescriptions Disp Refills   sildenafil (VIAGRA) 100 MG tablet [Pharmacy Med Name: SILDENAFIL 100 MG TABLET] 5 tablet 2    Sig: Take 0.5-1 tablets (50-100 mg total) by mouth daily as needed for erectile dysfunction.     Urology: Erectile Dysfunction Agents Passed - 05/26/2023  5:58 PM      Passed - AST in normal range and within 360 days    AST  Date Value Ref Range Status  05/21/2023 11 0 - 40 IU/L Final   SGOT(AST)  Date Value Ref Range Status  01/28/2014 22 15 - 37 Unit/L Final         Passed - ALT in normal range and within 360 days    ALT  Date Value Ref Range Status  05/21/2023 17 0 - 44 IU/L Final   SGPT (ALT)  Date Value Ref Range Status  01/28/2014 36 U/L Final    Comment:    14-63 NOTE: New Reference Range 11/16/13    ALT (SGPT) P5P  Date Value Ref Range Status  10/31/2020 16 0 - 55 IU/L Final         Passed - Last BP in normal range    BP Readings from Last 1 Encounters:  05/21/23 128/78         Passed - Valid encounter within last 12 months    Recent Outpatient Visits           5 days ago Type 2 diabetes mellitus with obesity (HCC)   [place] [facility] [place], [place] T, NP   3 months ago Acute deep vein thrombosis (DVT) of popliteal vein of left lower extremity (HCC)   [place] [facility] [place], [place] T, NP   4 months ago Depression, major, single episode, moderate (HCC)   [facility] [place], [place] T, NP   7 months ago Type 2 diabetes mellitus with obesity (HCC)   [place] [facility] [place], Shafer T, NP   10 months ago Depression, major, single episode, moderate (HCC)   [place] [facility] [place], Dorie Rank, NP       Future Appointments             In 3 months [place], Dorie Rank, NP [facility]

## 2023-05-26 NOTE — Addendum Note (Signed)
Addended by: Aura Dials T on: 05/26/2023 12:33 PM   Modules accepted: Orders

## 2023-05-27 ENCOUNTER — Other Ambulatory Visit: Payer: Self-pay

## 2023-05-27 ENCOUNTER — Emergency Department: Payer: 59

## 2023-05-27 ENCOUNTER — Telehealth: Payer: Self-pay | Admitting: Nurse Practitioner

## 2023-05-27 ENCOUNTER — Encounter: Payer: Self-pay | Admitting: Intensive Care

## 2023-05-27 ENCOUNTER — Ambulatory Visit: Payer: Self-pay

## 2023-05-27 ENCOUNTER — Emergency Department
Admission: EM | Admit: 2023-05-27 | Discharge: 2023-05-27 | Disposition: A | Discharge status: Home / Self Care | Payer: 59 | Attending: Emergency Medicine | Admitting: Emergency Medicine

## 2023-05-27 DIAGNOSIS — R531 Weakness: Secondary | ICD-10-CM | POA: Insufficient documentation

## 2023-05-27 DIAGNOSIS — R410 Disorientation, unspecified: Secondary | ICD-10-CM | POA: Diagnosis not present

## 2023-05-27 DIAGNOSIS — H538 Other visual disturbances: Secondary | ICD-10-CM | POA: Diagnosis not present

## 2023-05-27 DIAGNOSIS — H532 Diplopia: Secondary | ICD-10-CM | POA: Diagnosis not present

## 2023-05-27 DIAGNOSIS — E119 Type 2 diabetes mellitus without complications: Secondary | ICD-10-CM | POA: Insufficient documentation

## 2023-05-27 DIAGNOSIS — R9082 White matter disease, unspecified: Secondary | ICD-10-CM | POA: Diagnosis not present

## 2023-05-27 DIAGNOSIS — R001 Bradycardia, unspecified: Secondary | ICD-10-CM | POA: Diagnosis not present

## 2023-05-27 DIAGNOSIS — R42 Dizziness and giddiness: Secondary | ICD-10-CM | POA: Diagnosis not present

## 2023-05-27 LAB — URINALYSIS, ROUTINE W REFLEX MICROSCOPIC
Bacteria, UA: NONE SEEN
Bilirubin Urine: NEGATIVE
Glucose, UA: >=500 mg/dL — AB
Hgb urine dipstick: NEGATIVE
Ketones, ur: NEGATIVE mg/dL
Leukocytes,Ua: NEGATIVE
Nitrite: NEGATIVE
Protein, ur: NEGATIVE mg/dL
Specific Gravity, Urine: 1.031 — ABNORMAL HIGH (ref 1.005–1.030)
pH: 6 (ref 5.0–8.0)

## 2023-05-27 LAB — CBC
HCT: 46 % (ref 39.0–52.0)
Hemoglobin: 15.8 g/dL (ref 13.0–17.0)
MCH: 32.6 pg (ref 26.0–34.0)
MCHC: 34.3 g/dL (ref 30.0–36.0)
MCV: 95 fL (ref 80.0–100.0)
Platelets: 150 10*3/uL (ref 150–400)
RBC: 4.84 MIL/uL (ref 4.22–5.81)
RDW: 13.1 % (ref 11.5–15.5)
WBC: 10.9 10*3/uL — ABNORMAL HIGH (ref 4.0–10.5)
nRBC: 0 % (ref 0.0–0.2)

## 2023-05-27 LAB — BASIC METABOLIC PANEL
Anion gap: 8 (ref 5–15)
BUN: 13 mg/dL (ref 6–20)
CO2: 26 mmol/L (ref 22–32)
Calcium: 8.9 mg/dL (ref 8.9–10.3)
Chloride: 103 mmol/L (ref 98–111)
Creatinine, Ser: 0.66 mg/dL (ref 0.61–1.24)
GFR, Estimated: >60 mL/min (ref >60)
Glucose, Bld: 154 mg/dL — ABNORMAL HIGH (ref 70–99)
Potassium: 3.9 mmol/L (ref 3.5–5.1)
Sodium: 137 mmol/L (ref 135–145)

## 2023-05-27 LAB — TROPONIN I (HIGH SENSITIVITY): Troponin I (High Sensitivity): 5 ng/L (ref <18)

## 2023-05-27 LAB — CBG MONITORING, ED: Glucose-Capillary: 130 mg/dL — ABNORMAL HIGH (ref 70–99)

## 2023-05-27 LAB — D-DIMER, QUANTITATIVE: D-Dimer, Quant: 0.27 ug{FEU}/mL (ref 0.00–0.50)

## 2023-05-27 NOTE — ED Provider Notes (Signed)
[facility] Provider Note    Event Date/Time   First MD Initiated Contact with Patient 05/27/23 1505     (approximate)   History   Weakness and Blurred Vision   HPI  Harold Tate is a 58 y.o. male with a history of well-controlled diabetes with recent A1c of 13 presents the ER for evaluation of what he describes as disorientation but seems like confusion as well as blurred vision this morning associated with right upper extremity numbness and tingling.  Denies any neck pain.  No chest pain does have a history of blood clots.  Denies any shortness of breath at this time no fevers.     Physical Exam   Triage Vital Signs: ED Triage Vitals  Encounter Vitals Group     BP 05/27/23 1110 119/69     Systolic BP Percentile --      Diastolic BP Percentile --      Pulse Rate 05/27/23 1110 (!) 59     Resp 05/27/23 1110 16     Temp 05/27/23 1110 97.9 F (36.6 C)     Temp Source 05/27/23 1110 Oral     SpO2 05/27/23 1110 93 %     Weight 05/27/23 1108 200 lb (90.7 kg)     Height 05/27/23 1108 6\' 2"  (1.88 m)     Head Circumference --      Peak Flow --      Pain Score 05/27/23 1108 0     Pain Loc --      Pain Education --      Exclude from Growth Chart --     Most recent vital signs: Vitals:   05/27/23 1513 05/27/23 1514  BP: 119/76 119/76  Pulse: 62 60  Resp: 16 16  Temp: 98.1 F (36.7 C) 98.1 F (36.7 C)  SpO2:  95%     Constitutional: Alert  Eyes: Conjunctivae are normal.  Head: Atraumatic. Nose: No congestion/rhinnorhea. Mouth/Throat: Mucous membranes are moist.   Neck: Painless ROM.  Cardiovascular:   Good peripheral circulation. Respiratory: Normal respiratory effort.  No retractions.  Gastrointestinal: Soft and nontender.  Musculoskeletal:  no deformity Neurologic:  MAE spontaneously. No gross focal neurologic deficits are appreciated.  Skin:  Skin is warm, dry and intact. No rash noted. Psychiatric: Mood and affect are normal.  Speech and behavior are normal.    ED Results / Procedures / Treatments   Labs (all labs ordered are listed, but only abnormal results are displayed) Labs Reviewed  BASIC METABOLIC PANEL - Abnormal; Notable for the following components:      Result Value   Glucose, Bld 154 (*)    All other components within normal limits  CBC - Abnormal; Notable for the following components:   WBC 10.9 (*)    All other components within normal limits  URINALYSIS, ROUTINE W REFLEX MICROSCOPIC - Abnormal; Notable for the following components:   Color, Urine YELLOW (*)    APPearance CLEAR (*)    Specific Gravity, Urine 1.031 (*)    Glucose, UA >=500 (*)    All other components within normal limits  CBG MONITORING, ED - Abnormal; Notable for the following components:   Glucose-Capillary 130 (*)    All other components within normal limits  D-DIMER, QUANTITATIVE  TROPONIN I (HIGH SENSITIVITY)     EKG  ED ECG REPORT I, Willy Eddy, the attending physician, personally viewed and interpreted this ECG.   Date: 05/27/2023  EKG Time: 11:05  Rate:  60  Rhythm: sinus  Axis: normal  Intervals: normal  ST&T Change: no stemi    RADIOLOGY Please see ED Course for my review and interpretation.  I personally reviewed all radiographic images ordered to evaluate for the above acute complaints and reviewed radiology reports and findings.  These findings were personally discussed with the patient.  Please see medical record for radiology report.    PROCEDURES:  Critical Care performed:   Procedures   MEDICATIONS ORDERED IN ED: Medications - No data to display   IMPRESSION / MDM / ASSESSMENT AND PLAN / ED COURSE  I reviewed the triage vital signs and the nursing notes.                              Differential diagnosis includes, but is not limited to, cva, tia, hypoglycemia, dehydration, electrolyte abnormality, dissection, sepsis  Patient presenting to the ER for evaluation of  symptoms as described above.  Based on symptoms, risk factors and considered above differential, this presenting complaint could reflect a potentially life-threatening illness therefore the patient will be placed on continuous pulse oximetry and telemetry for monitoring.  Laboratory evaluation will be sent to evaluate for the above complaints.  CT imaging ordered triage shows evidence of old infarcts.  Patient does not have a history of infarcts that he is aware of.  Given his consolation of symptoms I am concerned for possible CVA.  Will order MRI to further evaluate.    Clinical Course as of 05/27/23 1626  Tue May 27, 2023  1554 MRI without evidence of CVA. [PR]  1611 D-dimer is negative.  Still waiting troponin. [PR]  1624 Blood work is reasuring.  Patient remains well appearing and in NAD.  I do believe patient stable and appropriate for outpatient follow up. [PR]    Clinical Course User Index [PR] Willy Eddy, MD     FINAL CLINICAL IMPRESSION(S) / ED DIAGNOSES   Final diagnoses:  Weakness  Transient disorientation     Rx / DC Orders   ED Discharge Orders     None        Note:  This document was prepared using Dragon voice recognition software and may include unintentional dictation errors.    Willy Eddy, MD 05/27/23 [id number]

## 2023-05-27 NOTE — Telephone Encounter (Signed)
Pt called in says  , these medicines aren't covered under his insurance, xarelto and farxiga.

## 2023-05-27 NOTE — Telephone Encounter (Signed)
Patient currently at the ER.

## 2023-05-27 NOTE — Telephone Encounter (Signed)
Chief Complaint: pt stated he woke up "not feeling right or disoriented." Symptoms: blurred vision, neck pain,  Frequency: this am Pertinent Negatives: Patient denies weakness or numbness to face arms legs. Disposition: [x] ED /[] Urgent Care (no appt availability in office) / [] Appointment(In office/virtual)/ []  [facility]/ [] Home Care/ [] Refused Recommended Disposition /[] [facility]/ []  Follow-up with PCP Additional Notes: pt admits to forgetting his Xarelto and not taking it on a daily basis, pt with h/o blood clots. Advised to go to ED or call 911. Pt intially refused but advised pt he is having neurological issues and now with neck pain concern for heart issues also since he has that h/o DVT. Pt stated he will not call 911, but will call sister to take him.  Reason for Disposition  [1] Blurred vision or visual changes AND [2] present now AND [3] sudden onset or new (e.g., minutes, hours, days)  (Exception: Seeing floaters / black specks OR previously diagnosed migraine headaches with same symptoms.)  Answer Assessment - Initial Assessment Questions 1. LEVEL OF CONSCIOUSNESS: "How is he (she, the patient) acting right now?" (e.g., alert-oriented, confused, lethargic, stuporous, comatose)     Alert oriented 2. ONSET: "When did the confusion start?"  (minutes, hours, days)     This am  3. PATTERN "Does this come and go, or has it been constant since it started?"  "Is it present now?"     Was fine yesterday  4. ALCOHOL or DRUGS: "Has he been drinking alcohol or taking any drugs?"      no 5. NARCOTIC MEDICINES: "Has he been receiving any narcotic medications?" (e.g., morphine, Vicodin)     no 6. CAUSE: "What do you think is causing the confusion?"      Sugar high  7. OTHER SYMPTOMS: "Are there any other symptoms?" (e.g., difficulty breathing, headache, fever, weakness)     Blurred vision, very tired last night  Answer Assessment - Initial Assessment Questions 1.  SYMPTOM: "What is the main symptom you are concerned about?" (e.g., weakness, numbness)     Blurred vision, "I feel off" pt unable to describe how he feels just "off" 2. ONSET: "When did this start?" (minutes, hours, days; while sleeping)     Don't feel right  3. LAST NORMAL: "When was the last time you (the patient) were normal (no symptoms)?"     Yesterday  4. PATTERN "Does this come and go, or has it been constant since it started?"  "Is it present now?"     constant 5. CARDIAC SYMPTOMS: "Have you had any of the following symptoms: chest pain, difficulty breathing, palpitations?"     Neck pain 6. NEUROLOGIC SYMPTOMS: "Have you had any of the following symptoms: headache, dizziness, vision loss, double vision, changes in speech, unsteady on your feet?"     Blurred vision, disorientation 7. OTHER SYMPTOMS: "Do you have any other symptoms?"     Neck pain  Answer Assessment - Initial Assessment Questions 1. DESCRIPTION: "How has your vision changed?" (e.g., complete vision loss, blurred vision, double vision, floaters, etc.)     Blurred vision 2. LOCATION: "One or both eyes?" If one, ask: "Which eye?"     both 4. ONSET: "When did this begin?" "Did it start suddenly or has this been gradual?"     This am  5. PATTERN: "Does this come and go, or has it been constant since it started?"     constant 6. PAIN: "Is there any pain in your eye(s)?"  (  Scale 1-10; or mild, moderate, severe)   - NONE (0): No pain.   - MILD (1-3): Doesn't interfere with normal activities.   - MODERATE (4-7): Interferes with normal activities or awakens from sleep.    - SEVERE (8-10): Excruciating pain, unable to do any normal activities.     Neck pain only  8. CAUSE: "What do you think is causing this visual problem?"     unsure 9. OTHER SYMPTOMS: "Do you have any other symptoms?" (e.g., confusion, headache, arm or leg weakness, speech problems)     Confusion, neck pain, "feels off"  Protocols used: Confusion -  Delirium-A-AH, Neurologic Deficit-A-AH, Vision Loss or Change-A-AH

## 2023-05-27 NOTE — ED Triage Notes (Signed)
Patient reports upon awakening 0800 today he was having blurry vision, dizziness and weakness. Reports some right arm tingling, neck pain and headache.  Patient dropped off by friend today   Patient reports A1C 13.4 per his last pcp visit

## 2023-05-27 NOTE — ED Provider Triage Note (Signed)
Emergency Medicine Provider Triage Evaluation Note  Harold Tate , a 58 y.o. male  was evaluated in triage.  Pt complains of woke up with blurry vision, dizziness and weakness. Also c/o headache, right arm tingling and some neck pain.  Denies injury.    Review of Systems  Positive: + h/a, blurred vision, weakness Negative: No N,V,D, fever or chills  Physical Exam  BP 119/69 (BP Location: Right Arm)   Pulse (!) 59   Temp 97.9 F (36.6 C) (Oral)   Resp 16   Ht 6\' 2"  (1.88 m)   Wt 90.7 kg   SpO2 93%   BMI 25.68 kg/m  Gen:   Awake, no distress  talkative, cooperative Resp:  Normal effort  MSK:   Moves extremities without difficulty  Other:    Medical Decision Making  Medically screening exam initiated at 11:13 AM.  Appropriate orders placed.  Harold Tate was informed that the remainder of the evaluation will be completed by another provider, this initial triage assessment does not replace that evaluation, and the importance of remaining in the ED until their evaluation is complete.     Harold Rumps, PA-C 05/27/23 1119

## 2023-05-28 ENCOUNTER — Telehealth: Payer: Self-pay | Admitting: *Deleted

## 2023-05-28 NOTE — Telephone Encounter (Signed)
Routing to provider to advise. Was Harold Tate supposed to be sent in instead of the Jardiance?

## 2023-05-28 NOTE — Telephone Encounter (Signed)
Patient is concerned about his Rx for Farxiga- not at pharmacy- unsure what London Pepper is for. Per note 05/21/23- he was to get [place] prescribed.  Patient was also seen in ED yesterday- has appointment scheduled 06/03/23- hospital f/u- he wants to know if he needs to keep this.

## 2023-05-29 NOTE — Telephone Encounter (Signed)
Called and notified patient of providers message.

## 2023-05-30 ENCOUNTER — Telehealth (INDEPENDENT_AMBULATORY_CARE_PROVIDER_SITE_OTHER): Payer: Self-pay

## 2023-05-30 NOTE — Telephone Encounter (Signed)
LVM for pt TCB and verify his address. We saent a reminder for a new patient appt on 3.4.25 but mail came back as undeliverable. Please update address.

## 2023-06-01 NOTE — Patient Instructions (Signed)

## 2023-06-03 ENCOUNTER — Encounter: Payer: Self-pay | Admitting: Nurse Practitioner

## 2023-06-03 ENCOUNTER — Ambulatory Visit (INDEPENDENT_AMBULATORY_CARE_PROVIDER_SITE_OTHER): Payer: 59 | Admitting: Nurse Practitioner

## 2023-06-03 VITALS — BP 133/74 | HR 89 | Temp 98.0°F | Ht 74.0 in | Wt 195.8 lb

## 2023-06-03 DIAGNOSIS — E669 Obesity, unspecified: Secondary | ICD-10-CM

## 2023-06-03 DIAGNOSIS — Z8673 Personal history of transient ischemic attack (TIA), and cerebral infarction without residual deficits: Secondary | ICD-10-CM | POA: Diagnosis not present

## 2023-06-03 DIAGNOSIS — R531 Weakness: Secondary | ICD-10-CM | POA: Diagnosis not present

## 2023-06-03 DIAGNOSIS — I82532 Chronic embolism and thrombosis of left popliteal vein: Secondary | ICD-10-CM | POA: Diagnosis not present

## 2023-06-03 DIAGNOSIS — E1169 Type 2 diabetes mellitus with other specified complication: Secondary | ICD-10-CM

## 2023-06-03 NOTE — Assessment & Plan Note (Signed)
 Chronic, history of similar.  Dicussed at length with him he will need to be on lifelong anticoagulant now, this was recommended by hematology in the past.  Working with PharmD Channing on cost assist with DOAC as is to be taking Xarelto , but currently not taking due to cost.  Scheduled to see vascular upcoming.  Recommend complete cessation of smoking.

## 2023-06-03 NOTE — Assessment & Plan Note (Signed)
 Chronic, recent A1c 13.4% due to poor diet choices, which he has now fixed, highest it has ever been for this provider. He had been very stable since 2022, with A1c 5-6%.  He has restarted leftover Farxiga  at home as ordered and then will pick up Jardiance  once completed.  His recent sugar had come down from 333 to 150 range.  Suspect we will be able to stop medication again in future if maintains a stable BS.  Recommend he check blood sugar daily -- currently he is not doing this.  Discussed with him risks of high sugar levels.  Urine ALB 150 January 2025, refuses ACE/ARB. - Foot exam up to date.  Needs eye exam, highly recommend he obtain as he reports reading vision is worse. - Refuses all vaccines - Zetia  start, refuses ACE/ARB.

## 2023-06-03 NOTE — Assessment & Plan Note (Signed)
Noted on imaging in ER on 05/27/23, no symptoms noted and reports no history of symptoms.  Highly recommend getting A1c back under 6.5%.  Goal BP <130/80 and LDL <55 for prevention.

## 2023-06-03 NOTE — Assessment & Plan Note (Signed)
One episode and no further.  ?related to reduction in sugar level with restarting medication and cutting back on diet choices.  Monitor closely.  Recheck CBC and CMP today.

## 2023-06-03 NOTE — Progress Notes (Signed)
 BP 133/74 (BP Location: Left Arm, Cuff Size: Normal)   Pulse 89   Temp 98 F (36.7 C) (Oral)   Ht 6' 2 (1.88 m)   Wt 195 lb 12.8 oz (88.8 kg)   SpO2 92%   BMI 25.14 kg/m    Subjective:    Patient ID: Harold Tate, male    DOB: Feb 10, 1966, 58 y.o.   MRN: [id number]  HPI: Harold Tate is a 58 y.o. male  Chief Complaint  Patient presents with   Diabetes   ER FOLLOW UP & DIABETES Went to ER on 05/27/23 for weakness and disorientation.  Had visit on 05/21/23 when A1c was noted to have increased to 13.4%, at baseline often in 5-6% range.  We restarted his Jardiance  at the visit and it was noted on ER labs his glucose did come down from 333 to 150 range.  He is no longer drinking sweet tea/Coca [employer]a and has reduced ice cream intake.  Is not checking sugars at home  Imaging CT noted some volume loss which was advanced for age, then MRI did not note this but did note remote lacunar infarct in posterior left lentiform nucleus and remote cortical infarct left front lobe.  He denies anymore episodes of disorientation or weakness. Time since discharge: 7 days Hospital/facility: [facility] Diagnosis: Weakness Procedures/tests: CT and MRI brain Consultants: none New medications: none Discharge instructions:  Follow-up with PCP Status: stable   Relevant past medical, surgical, family and social history reviewed and updated as indicated. Interim medical history since our last visit reviewed. Allergies and medications reviewed and updated.  Review of Systems  Constitutional:  Negative for activity change, appetite change, fatigue and fever.  Respiratory:  Negative for cough, chest tightness, shortness of breath and wheezing.   Cardiovascular:  Negative for chest pain, palpitations and leg swelling.  Gastrointestinal: Negative.   Endocrine: Negative for polydipsia (improved), polyphagia and polyuria (improved).  Neurological:  Negative for dizziness, facial asymmetry, weakness,  light-headedness, numbness and headaches.  Psychiatric/Behavioral: Negative.     Per HPI unless specifically indicated above     Objective:    BP 133/74 (BP Location: Left Arm, Cuff Size: Normal)   Pulse 89   Temp 98 F (36.7 C) (Oral)   Ht 6' 2 (1.88 m)   Wt 195 lb 12.8 oz (88.8 kg)   SpO2 92%   BMI 25.14 kg/m   Wt Readings from Last 3 Encounters:  06/03/23 195 lb 12.8 oz (88.8 kg)  05/27/23 200 lb (90.7 kg)  05/21/23 198 lb 6.4 oz (90 kg)    Physical Exam Vitals and nursing note reviewed.  Constitutional:      General: He is awake. He is not in acute distress.    Appearance: He is well-developed and well-groomed. He is not ill-appearing or toxic-appearing.  HENT:     Head: Normocephalic.     Right Ear: Hearing and external ear normal.     Left Ear: Hearing and external ear normal.  Eyes:     General: Lids are normal.     Extraocular Movements: Extraocular movements intact.     Conjunctiva/sclera: Conjunctivae normal.  Neck:     Thyroid: No thyromegaly.     Vascular: No carotid bruit.  Cardiovascular:     Rate and Rhythm: Normal rate and regular rhythm.     Heart sounds: Normal heart sounds. No murmur heard.    No gallop.  Pulmonary:     Effort: No accessory muscle usage or respiratory  distress.     Breath sounds: Normal breath sounds.  Abdominal:     General: Bowel sounds are normal. There is no distension.     Palpations: Abdomen is soft.     Tenderness: There is no abdominal tenderness.  Musculoskeletal:     Cervical back: Full passive range of motion without pain.     Right lower leg: No edema.     Left lower leg: No edema.  Lymphadenopathy:     Cervical: No cervical adenopathy.  Skin:    General: Skin is warm.     Capillary Refill: Capillary refill takes less than 2 seconds.  Neurological:     Mental Status: He is alert and oriented to person, place, and time.     Cranial Nerves: Cranial nerves 2-12 are intact.     Motor: Motor function is intact.      Coordination: Coordination is intact.     Deep Tendon Reflexes: Reflexes are normal and symmetric.     Reflex Scores:      Brachioradialis reflexes are 2+ on the right side and 2+ on the left side.      Patellar reflexes are 2+ on the right side and 2+ on the left side. Psychiatric:        Attention and Perception: Attention normal.        Mood and Affect: Mood normal.        Speech: Speech normal.        Behavior: Behavior normal. Behavior is cooperative.        Thought Content: Thought content normal.    Results for orders placed or performed during the hospital encounter of 05/27/23  CBG monitoring, ED   Collection Time: 05/27/23 11:11 AM  Result Value Ref Range   Glucose-Capillary 130 (H) 70 - 99 mg/dL  Urinalysis, Routine w reflex microscopic -Urine, Clean Catch   Collection Time: 05/27/23 11:13 AM  Result Value Ref Range   Color, Urine YELLOW (A) YELLOW   APPearance CLEAR (A) CLEAR   Specific Gravity, Urine 1.031 (H) 1.005 - 1.030   pH 6.0 5.0 - 8.0   Glucose, UA >=500 (A) NEGATIVE mg/dL   Hgb urine dipstick NEGATIVE NEGATIVE   Bilirubin Urine NEGATIVE NEGATIVE   Ketones, ur NEGATIVE NEGATIVE mg/dL   Protein, ur NEGATIVE NEGATIVE mg/dL   Nitrite NEGATIVE NEGATIVE   Leukocytes,Ua NEGATIVE NEGATIVE   RBC / HPF 0-5 0 - 5 RBC/hpf   WBC, UA 0-5 0 - 5 WBC/hpf   Bacteria, UA NONE SEEN NONE SEEN   Squamous Epithelial / HPF 0-5 0 - 5 /HPF  Basic metabolic panel   Collection Time: 05/27/23 11:14 AM  Result Value Ref Range   Sodium 137 135 - 145 mmol/L   Potassium 3.9 3.5 - 5.1 mmol/L   Chloride 103 98 - 111 mmol/L   CO2 26 22 - 32 mmol/L   Glucose, Bld 154 (H) 70 - 99 mg/dL   BUN 13 6 - 20 mg/dL   Creatinine, Ser 9.33 0.61 - 1.24 mg/dL   Calcium  8.9 8.9 - 10.3 mg/dL   GFR, Estimated >39 >39 mL/min   Anion gap 8 5 - 15  CBC   Collection Time: 05/27/23 11:14 AM  Result Value Ref Range   WBC 10.9 (H) 4.0 - 10.5 K/uL   RBC 4.84 4.22 - 5.81 MIL/uL   Hemoglobin 15.8  13.0 - 17.0 g/dL   HCT 53.9 60.9 - 47.9 %   MCV 95.0 80.0 - 100.0 fL  MCH 32.6 26.0 - 34.0 pg   MCHC 34.3 30.0 - 36.0 g/dL   RDW 86.8 88.4 - 84.4 %   Platelets 150 150 - 400 K/uL   nRBC 0.0 0.0 - 0.2 %  D-dimer, quantitative   Collection Time: 05/27/23 11:14 AM  Result Value Ref Range   D-Dimer, Quant 0.27 0.00 - 0.50 ug/mL-FEU  Troponin I (High Sensitivity)   Collection Time: 05/27/23 11:14 AM  Result Value Ref Range   Troponin I (High Sensitivity) 5 <18 ng/L      Assessment & Plan:   Problem List Items Addressed This Visit       Cardiovascular and Mediastinum   Chronic deep vein thrombosis (DVT) of popliteal vein of left lower extremity (HCC)   Chronic, history of similar.  Dicussed at length with him he will need to be on lifelong anticoagulant now, this was recommended by hematology in the past.  Working with PharmD Channing on cost assist with DOAC as is to be taking Xarelto , but currently not taking due to cost.  Scheduled to see vascular upcoming.  Recommend complete cessation of smoking.        Endocrine   Type 2 diabetes mellitus with obesity (HCC) - Primary   Chronic, recent A1c 13.4% due to poor diet choices, which he has now fixed, highest it has ever been for this provider. He had been very stable since 2022, with A1c 5-6%.  He has restarted leftover Farxiga  at home as ordered and then will pick up Jardiance  once completed.  His recent sugar had come down from 333 to 150 range.  Suspect we will be able to stop medication again in future if maintains a stable BS.  Recommend he check blood sugar daily -- currently he is not doing this.  Discussed with him risks of high sugar levels.  Urine ALB 150 January 2025, refuses ACE/ARB. - Foot exam up to date.  Needs eye exam, highly recommend he obtain as he reports reading vision is worse. - Refuses all vaccines - Zetia  start, refuses ACE/ARB.      Relevant Orders   Basic metabolic panel   CBC with Differential/Platelet      Nervous and Auditory   History of cerebral infarction   Noted on imaging in ER on 05/27/23, no symptoms noted and reports no history of symptoms.  Highly recommend getting A1c back under 6.5%.  Goal BP <130/80 and LDL <55 for prevention.        Other   Weakness   One episode and no further.  ?related to reduction in sugar level with restarting medication and cutting back on diet choices.  Monitor closely.  Recheck CBC and CMP today.        Follow up plan: Return for as scheduled in April, sooner if worsening symptoms present.

## 2023-06-04 ENCOUNTER — Telehealth: Payer: Self-pay

## 2023-06-04 ENCOUNTER — Telehealth: Payer: Self-pay | Admitting: Nurse Practitioner

## 2023-06-04 LAB — CBC WITH DIFFERENTIAL/PLATELET
Basophils Absolute: 0.1 10*3/uL (ref 0.0–0.2)
Basos: 1 %
EOS (ABSOLUTE): 0.2 10*3/uL (ref 0.0–0.4)
Eos: 3 %
Hematocrit: 44.2 % (ref 37.5–51.0)
Hemoglobin: 15.4 g/dL (ref 13.0–17.7)
Immature Grans (Abs): 0 10*3/uL (ref 0.0–0.1)
Immature Granulocytes: 0 %
Lymphocytes Absolute: 2 10*3/uL (ref 0.7–3.1)
Lymphs: 34 %
MCH: 33.3 pg — ABNORMAL HIGH (ref 26.6–33.0)
MCHC: 34.8 g/dL (ref 31.5–35.7)
MCV: 96 fL (ref 79–97)
Monocytes Absolute: 0.5 10*3/uL (ref 0.1–0.9)
Monocytes: 9 %
Neutrophils Absolute: 3.1 10*3/uL (ref 1.4–7.0)
Neutrophils: 53 %
Platelets: 179 10*3/uL (ref 150–450)
RBC: 4.63 x10E6/uL (ref 4.14–5.80)
RDW: 12.8 % (ref 11.6–15.4)
WBC: 5.9 10*3/uL (ref 3.4–10.8)

## 2023-06-04 LAB — BASIC METABOLIC PANEL
BUN/Creatinine Ratio: 15 (ref 9–20)
BUN: 13 mg/dL (ref 6–24)
CO2: 22 mmol/L (ref 20–29)
Calcium: 9.1 mg/dL (ref 8.7–10.2)
Chloride: 104 mmol/L (ref 96–106)
Creatinine, Ser: 0.85 mg/dL (ref 0.76–1.27)
Glucose: 127 mg/dL — ABNORMAL HIGH (ref 70–99)
Potassium: 4.3 mmol/L (ref 3.5–5.2)
Sodium: 140 mmol/L (ref 134–144)
eGFR: 101 mL/min/{1.73_m2} (ref >59)

## 2023-06-04 NOTE — Telephone Encounter (Signed)
 Pt is calling in because he says [employer] denied to cover his blood clot medication and he is requesting to speak with the clinic pharmacist regarding this matter. Please follow up with pt.

## 2023-06-04 NOTE — Telephone Encounter (Signed)
 Bartholomew Light is assisting patient with getting this correct?

## 2023-06-04 NOTE — Progress Notes (Signed)
 Good morning, please let Harold Tate know his labs have returned -- he has not checked MyChart since 2023: Good morning Harold Tate your labs have returned and kidney function remains stable.  Sugar continues to come down, which is wonderful.  Continue your diabetes medication as ordered.  Very important!!  Plus continue your diet focus.  CBC shows white blood cell count now in normal range.  Great news!!  No changes needed.  Any questions? Keep being amazing!!  Thank you for allowing me to participate in your care.  I appreciate you. Kindest regards, Rayah Fines

## 2023-06-04 NOTE — Progress Notes (Signed)
   06/04/2023  Patient ID: Harold Tate, male   DOB: [birth year], 58 y.o.   MRN: [id number]  Incoming message from patient's PCP stating he is having trouble getting his Xarelto  prescription, stating insurance is not paying for this.  Contacted [facility], and they were not billing patient's primary insurance plan, so Medicaid was rejecting stating they were not the primary payor. Issue has been resolved, and medication is now going through for $4 co-pay.  Contacted patient to make him aware and also suggest canceling the plan he currently has through the marketplace since he now has active Medicaid.  Provided the phone number for the [employer] plan member services line and also the number for Medicaid, so he can contact them to notify if/when they become his primary payor.  Channing DELENA Mealing, PharmD, DPLA

## 2023-06-20 NOTE — Telephone Encounter (Signed)
 Patient called asking if Harold Tate can give him a call back [phone].  He is confused about his medication and insurance.  He states you have been helping him with his medication.

## 2023-06-26 NOTE — Telephone Encounter (Unsigned)
 Copied from CRM [id number]. Topic: Clinical - Prescription Issue >> Jun 26, 2023 11:36 AM Gildardo Pounds wrote: Reason for CRM: Patient is calling about prescriptions not being covered by insurance. He has been working with Okey Regal on getting this straight. Patient is out of his diabetes medication. Callback number is [phone]

## 2023-06-27 ENCOUNTER — Telehealth: Payer: Self-pay

## 2023-06-27 NOTE — Progress Notes (Signed)
   06/27/2023  Patient ID: Harold Tate, male   DOB: 06-01-1965, 58 y.o.   MRN: [id number]  In basket message this morning stating patient requesting call back regarding medication coverage.  Contacted patient and he endorses trying to get in contact with me since last Friday.  Patient has not been able to refill his [place].  Confused why London Pepper was sent to pharmacy versus Marcell[facility] Deist, and I do not see chart notes reflecting intentional change.  Patient also endorses ending [employer] coverage, so Medicaid is now primary insurance.  He has talked to his Medicaid case worker to make Medicaid primary coverage- she states this change could take 30 days.  Contacted pharmacy, and they have an active [place] prescription on file; but when they try to process, insurance states PA has been denied  When London Pepper is processed, insurance states PA is required.  Plan is also coming back stating patient has other primary coverage.  Contacted plan, and they are still showing patient has other primary insurance coverage and suggest patient call customer service number on the back of his card b/c this change should only take 48 hours.  I have re-submitted PA request for Farxiga 10mg  daily to Medicaid through [facility] to try to continue therapy versus changing to Jardiance based on patient preference and control on previous regimen.  I have also left a message for patient's Medicaid case worker for assistance with claim processing.  Lenna Gilford, PharmD, DPLA

## 2023-06-27 NOTE — Progress Notes (Signed)
   06/27/2023  Patient ID: [name], male   DOB: 09/25/1965, 58 y.o.   MRN: [id number]  Prior authorization has been approved for [place] 10mg  daily.  Attempted to call patient to make him aware and provide phone number for member services, so he can hopefully get [employer] removed as other payer; but I was not able to reach him.  HIPAA compliant voicemail left with my direct phone number.  Lenna Gilford, PharmD, DPLA

## 2023-07-01 ENCOUNTER — Encounter (INDEPENDENT_AMBULATORY_CARE_PROVIDER_SITE_OTHER): Payer: Self-pay | Admitting: Vascular Surgery

## 2023-07-01 ENCOUNTER — Other Ambulatory Visit: Payer: Self-pay | Admitting: Nurse Practitioner

## 2023-07-01 ENCOUNTER — Ambulatory Visit (INDEPENDENT_AMBULATORY_CARE_PROVIDER_SITE_OTHER): Payer: Self-pay | Admitting: Vascular Surgery

## 2023-07-01 VITALS — BP 114/77 | HR 81 | Resp 18 | Ht 73.0 in | Wt 196.2 lb

## 2023-07-01 DIAGNOSIS — M79662 Pain in left lower leg: Secondary | ICD-10-CM | POA: Diagnosis not present

## 2023-07-01 DIAGNOSIS — I152 Hypertension secondary to endocrine disorders: Secondary | ICD-10-CM | POA: Diagnosis not present

## 2023-07-01 DIAGNOSIS — M79661 Pain in right lower leg: Secondary | ICD-10-CM | POA: Diagnosis not present

## 2023-07-01 DIAGNOSIS — E669 Obesity, unspecified: Secondary | ICD-10-CM | POA: Diagnosis not present

## 2023-07-01 DIAGNOSIS — I82532 Chronic embolism and thrombosis of left popliteal vein: Secondary | ICD-10-CM | POA: Diagnosis not present

## 2023-07-01 DIAGNOSIS — F411 Generalized anxiety disorder: Secondary | ICD-10-CM

## 2023-07-01 DIAGNOSIS — E1159 Type 2 diabetes mellitus with other circulatory complications: Secondary | ICD-10-CM | POA: Diagnosis not present

## 2023-07-01 DIAGNOSIS — E1169 Type 2 diabetes mellitus with other specified complication: Secondary | ICD-10-CM

## 2023-07-01 DIAGNOSIS — I779 Disorder of arteries and arterioles, unspecified: Secondary | ICD-10-CM | POA: Diagnosis not present

## 2023-07-01 DIAGNOSIS — M79609 Pain in unspecified limb: Secondary | ICD-10-CM | POA: Insufficient documentation

## 2023-07-01 NOTE — Assessment & Plan Note (Signed)
 Patient has now had 3 DVTs as best I can tell.  There was no clear provocative event for the most 2 recent DVTs to his knowledge.  He is currently on Xarelto at 20 mg daily.  I am going to plan to repeat an ultrasound at his follow-up visit and we can likely discuss reducing to a prophylactic dose of Xarelto or Eliquis at that point.

## 2023-07-01 NOTE — Assessment & Plan Note (Signed)
 blood pressure control important in reducing the progression of atherosclerotic disease. On appropriate oral medications.

## 2023-07-01 NOTE — Progress Notes (Signed)
 Patient ID: Harold Tate, male   DOB: 19-May-1965, 58 y.o.   MRN: [id number]  Chief Complaint  Patient presents with   Follow-up    np. ED F/U from 10.4.24 DVT.    HPI Harold Tate is a 58 y.o. male.  I am asked to see the patient by Aura Dials for evaluation of multiple previous DVTs.  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  He had a major trauma with multiple surgeries to the left leg about 10 years ago and had a blood clot in that leg following the surgeries.  There is an ultrasound from 4 years ago that shows a right lower extremity DVT but he does not recall this.  About 5 months ago, he had a duplex done which showed a left popliteal and tibial vein DVT.  He has been placed on Xarelto and has tolerated this well without bleeding or other issues.  He has now been on this for about 5 months.  He denies any significant leg pain or swelling currently at rest.  He does report some tiredness and cramping in his legs with shorter distances of walking.  He has attributed this to his neurologic condition/musculoskeletal condition but has had other family members such as his uncle have significant peripheral arterial disease with similar symptoms.  No chest pain or shortness of breath. He also brings in a report from what sounds like an outside MRI study showing significant vascular calcifications in the vessels at the base of the skull with some significant intracranial calcifications as well.  He has not had a carotid ultrasound that I see in his history.  He does not think he has had a true stroke but does have some memory issues.     Past Medical History:  Diagnosis Date   Anxiety    Arthritis    Diabetes mellitus without complication (HCC)    Patient states A1C has gone down, no longer a diabetic   Dyspnea    Hip pain    Hypertension    Raynaud's syndrome    Sleep  apnea     CPAP/ Doesn't use    Past Surgical History:  Procedure Laterality Date   COLONOSCOPY WITH PROPOFOL N/A 12/08/2020   Procedure: COLONOSCOPY WITH PROPOFOL;  Surgeon: Midge Minium, MD;  Location: [facility];  Service: Endoscopy;  Laterality: N/A;   ESOPHAGOGASTRODUODENOSCOPY (EGD) WITH PROPOFOL N/A 12/08/2020   Procedure: ESOPHAGOGASTRODUODENOSCOPY (EGD) WITH PROPOFOL;  Surgeon: Midge Minium, MD;  Location: [facility];  Service: Endoscopy;  Laterality: N/A;   I & D EXTREMITY     OPEN REDUCTION INTERNAL FIXATION (ORIF) TIBIA/FIBULA FRACTURE Left July 2014   POLYPECTOMY N/A 12/08/2020   Procedure: POLYPECTOMY;  Surgeon: Midge Minium, MD;  Location: [facility];  Service: Endoscopy;  Laterality: N/A;   TOTAL HIP ARTHROPLASTY Right 11/03/2018   Procedure: RIGHT TOTAL HIP ARTHROPLASTY ANTERIOR APPROACH;  Surgeon: Kennedy Bucker, MD;  Location: [facility];  Service: Orthopedics;  Laterality: Right;   TOTAL HIP ARTHROPLASTY Left 02/11/2019   Procedure: TOTAL HIP ARTHROPLASTY ANTERIOR APPROACH;  Surgeon: Kennedy Bucker, MD;  Location: [facility];  Service: Orthopedics;  Laterality: Left;     Family History  Problem Relation Age of Onset   Depression Sister   No bleeding or clotting disorders Sister has hereditary spasticity as he does   Social History   Tobacco  Use   Smoking status: Every Day    Current packs/day: 1.00    Average packs/day: 1 pack/day for 27.0 years (27.0 ttl pk-yrs)    Types: Cigarettes   Smokeless tobacco: Never  Vaping Use   Vaping status: Never Used  Substance Use Topics   Alcohol use: Not Currently   Drug use: Not Currently    Types: Benzodiazepines     Allergies  Allergen Reactions   Metformin And Related Diarrhea   Chlorhexidine Rash    Current Outpatient Medications  Medication Sig Dispense Refill   baclofen (LIORESAL) 10 MG tablet Take 10 mg by mouth 3 (three) times daily.     clonazePAM (KLONOPIN) 0.5 MG tablet Take 1  tablet (0.5 mg total) by mouth 3 (three) times daily as needed for anxiety. 90 tablet 0   ezetimibe (ZETIA) 10 MG tablet Take 1 tablet (10 mg total) by mouth daily. 90 tablet 3   FARXIGA 10 MG TABS tablet Take 10 mg by mouth daily.     rivaroxaban (XARELTO) 20 MG TABS tablet Take 1 tablet (20 mg total) by mouth daily with supper. DO NOT START until finished 21 day starter pack. 30 tablet 4   sildenafil (VIAGRA) 100 MG tablet Take 0.5-1 tablets (50-100 mg total) by mouth daily as needed for erectile dysfunction. 5 tablet 2   No current facility-administered medications for this visit.      REVIEW OF SYSTEMS (Negative unless checked)  Constitutional: [] Weight loss  [] Fever  [] Chills Cardiac: [] Chest pain   [] Chest pressure   [] Palpitations   [] Shortness of breath when laying flat   [] Shortness of breath at rest   [] Shortness of breath with exertion. Vascular:  [] Pain in legs with walking   [] Pain in legs at rest   [] Pain in legs when laying flat   [] Claudication   [] Pain in feet when walking  [] Pain in feet at rest  [] Pain in feet when laying flat   [] History of DVT   [] Phlebitis   [] Swelling in legs   [] Varicose veins   [] Non-healing ulcers Pulmonary:   [] Uses home oxygen   [] Productive cough   [] Hemoptysis   [] Wheeze  [] COPD   [] Asthma Neurologic:  [] Dizziness  [] Blackouts   [] Seizures   [] History of stroke   [] History of TIA  [] Aphasia   [] Temporary blindness   [] Dysphagia   [x] Weakness or numbness in arms   [x] Weakness or numbness in legs Musculoskeletal:  [x] Arthritis   [] Joint swelling   [] Joint pain   [] Low back pain Hematologic:  [] Easy bruising  [] Easy bleeding   [] Hypercoagulable state   [] Anemic  [] Hepatitis Gastrointestinal:  [] Blood in stool   [] Vomiting blood  [] Gastroesophageal reflux/heartburn   [] Abdominal pain Genitourinary:  [] Chronic kidney disease   [] Difficult urination  [] Frequent urination  [] Burning with urination   [] Hematuria Skin:  [] Rashes   [] Ulcers    [] Wounds Psychological:  [x] History of anxiety   []  History of major depression.    Physical Exam BP 114/77   Pulse 81   Resp 18   Ht 6\' 1"  (1.854 m)   Wt 196 lb 3.2 oz (89 kg)   BMI 25.89 kg/m  Gen:  WD/WN, NAD Head: [place]/AT, No temporalis wasting. Ear/Nose/Throat: Hearing grossly intact, nares w/o erythema or drainage, oropharynx w/o Erythema/Exudate Eyes: Conjunctiva clear, sclera non-icteric  Neck: trachea midline.  No JVD.  Pulmonary:  Good air movement, respirations not labored, no use of accessory muscles  Cardiac: RRR, no JVD Vascular:  Vessel Right Left  Radial Palpable Palpable                          DP 1+ 1+  PT 1+ 1+   Gastrointestinal:. No masses, surgical incisions, or scars. Musculoskeletal: M/S 5/5 throughout.  Extremities without ischemic changes.  No deformity or atrophy. No significant edema this morning. Neurologic: Sensation grossly intact in extremities.  Symmetrical.  Speech is fluent. Motor exam as listed above. Psychiatric: Judgment intact, Mood & affect appropriate for pt's clinical situation. Dermatologic: No rashes or ulcers noted.  No cellulitis or open wounds.    Radiology No results found.  Labs Recent Results (from the past 2160 hours)  Bayer DCA Hb A1c Waived     Status: Abnormal   Collection Time: 05/21/23  3:03 PM  Result Value Ref Range   HB A1C (BAYER DCA - WAIVED) 13.4 (H) 4.8 - 5.6 %    Comment:          Prediabetes: 5.7 - 6.4          Diabetes: >6.4          Glycemic control for adults with diabetes: <7.0   Microalbumin, Urine Waived     Status: Abnormal   Collection Time: 05/21/23  3:03 PM  Result Value Ref Range   Microalb, Ur Waived 150 (H) 0 - 19 mg/L   Creatinine, Urine Waived 100 10 - 300 mg/dL   Microalb/Creat Ratio 30-300 (H) <30 mg/g    Comment:                              Abnormal:       30 - 300                         High Abnormal:           >300   CBC with Differential/Platelet     Status: Abnormal    Collection Time: 05/21/23  3:04 PM  Result Value Ref Range   WBC 7.1 3.4 - 10.8 x10E3/uL   RBC 4.92 4.14 - 5.80 x10E6/uL   Hemoglobin 15.8 13.0 - 17.7 g/dL   Hematocrit 47.8 29.5 - 51.0 %   MCV 96 79 - 97 fL   MCH 32.1 26.6 - 33.0 pg   MCHC 33.5 31.5 - 35.7 g/dL   RDW 62.1 30.8 - 65.7 %   Platelets 145 (L) 150 - 450 x10E3/uL   Neutrophils 64 Not Estab. %   Lymphs 27 Not Estab. %   Monocytes 6 Not Estab. %   Eos 2 Not Estab. %   Basos 1 Not Estab. %   Neutrophils Absolute 4.6 1.4 - 7.0 x10E3/uL   Lymphocytes Absolute 2.0 0.7 - 3.1 x10E3/uL   Monocytes Absolute 0.4 0.1 - 0.9 x10E3/uL   EOS (ABSOLUTE) 0.1 0.0 - 0.4 x10E3/uL   Basophils Absolute 0.1 0.0 - 0.2 x10E3/uL   Immature Granulocytes 0 Not Estab. %   Immature Grans (Abs) 0.0 0.0 - 0.1 x10E3/uL  Comprehensive metabolic panel     Status: Abnormal   Collection Time: 05/21/23  3:04 PM  Result Value Ref Range   Glucose 333 (H) 70 - 99 mg/dL   BUN 7 6 - 24 mg/dL   Creatinine, Ser 8.46 0.76 - 1.27 mg/dL   eGFR 962 >95 MW/UXL/2.44   BUN/Creatinine Ratio 9 9 - 20   Sodium 135 134 -  144 mmol/L   Potassium 4.3 3.5 - 5.2 mmol/L   Chloride 95 (L) 96 - 106 mmol/L   CO2 26 20 - 29 mmol/L   Calcium 9.5 8.7 - 10.2 mg/dL   Total Protein 6.9 6.0 - 8.5 g/dL   Albumin 4.2 3.8 - 4.9 g/dL   Globulin, Total 2.7 1.5 - 4.5 g/dL   Bilirubin Total 0.9 0.0 - 1.2 mg/dL   Alkaline Phosphatase 140 (H) 44 - 121 IU/L   AST 11 0 - 40 IU/L   ALT 17 0 - 44 IU/L  Lipid Panel w/o Chol/HDL Ratio     Status: Abnormal   Collection Time: 05/21/23  3:04 PM  Result Value Ref Range   Cholesterol, Total 181 100 - 199 mg/dL   Triglycerides 161 (H) 0 - 149 mg/dL   HDL 41 >09 mg/dL   VLDL Cholesterol Cal 34 5 - 40 mg/dL   LDL Chol Calc (NIH) 604 (H) 0 - 99 mg/dL  TSH     Status: None   Collection Time: 05/21/23  3:04 PM  Result Value Ref Range   TSH 1.200 0.450 - 4.500 uIU/mL  PSA     Status: None   Collection Time: 05/21/23  3:04 PM  Result Value Ref  Range   Prostate Specific Ag, Serum 0.4 0.0 - 4.0 ng/mL    Comment: Roche ECLIA methodology. According to the American Urological Association, Serum PSA should decrease and remain at undetectable levels after radical prostatectomy. The AUA defines biochemical recurrence as an initial PSA value 0.2 ng/mL or greater followed by a subsequent confirmatory PSA value 0.2 ng/mL or greater. Values obtained with different assay methods or kits cannot be used interchangeably. Results cannot be interpreted as absolute evidence of the presence or absence of malignant disease.   Vitamin B12     Status: None   Collection Time: 05/21/23  3:04 PM  Result Value Ref Range   Vitamin B-12 511 232 - 1,245 pg/mL  CBG monitoring, ED     Status: Abnormal   Collection Time: 05/27/23 11:11 AM  Result Value Ref Range   Glucose-Capillary 130 (H) 70 - 99 mg/dL    Comment: Glucose reference range applies only to samples taken after fasting for at least 8 hours.  Urinalysis, Routine w reflex microscopic -Urine, Clean Catch     Status: Abnormal   Collection Time: 05/27/23 11:13 AM  Result Value Ref Range   Color, Urine YELLOW (A) YELLOW   APPearance CLEAR (A) CLEAR   Specific Gravity, Urine 1.031 (H) 1.005 - 1.030   pH 6.0 5.0 - 8.0   Glucose, UA >=500 (A) NEGATIVE mg/dL   Hgb urine dipstick NEGATIVE NEGATIVE   Bilirubin Urine NEGATIVE NEGATIVE   Ketones, ur NEGATIVE NEGATIVE mg/dL   Protein, ur NEGATIVE NEGATIVE mg/dL   Nitrite NEGATIVE NEGATIVE   Leukocytes,Ua NEGATIVE NEGATIVE   RBC / HPF 0-5 0 - 5 RBC/hpf   WBC, UA 0-5 0 - 5 WBC/hpf   Bacteria, UA NONE SEEN NONE SEEN   Squamous Epithelial / HPF 0-5 0 - 5 /HPF    Comment: Performed at [facility], [address] [place], [place] [address]  Basic metabolic panel     Status: Abnormal   Collection Time: 05/27/23 11:14 AM  Result Value Ref Range   Sodium 137 135 - 145 mmol/L   Potassium 3.9 3.5 - 5.1 mmol/L   Chloride 103 98 - 111 mmol/L    CO2 26 22 - 32 mmol/L  Glucose, Bld 154 (H) 70 - 99 mg/dL    Comment: Glucose reference range applies only to samples taken after fasting for at least 8 hours.   BUN 13 6 - 20 mg/dL   Creatinine, Ser 1.61 0.61 - 1.24 mg/dL   Calcium 8.9 8.9 - 09.6 mg/dL   GFR, Estimated >04 >54 mL/min    Comment: (NOTE) Calculated using the CKD-EPI Creatinine Equation (2021)    Anion gap 8 5 - 15    Comment: Performed at [facility], [address] [place], [place] [address]  CBC     Status: Abnormal   Collection Time: 05/27/23 11:14 AM  Result Value Ref Range   WBC 10.9 (H) 4.0 - 10.5 K/uL   RBC 4.84 4.22 - 5.81 MIL/uL   Hemoglobin 15.8 13.0 - 17.0 g/dL   HCT 91.4 78.2 - 95.6 %   MCV 95.0 80.0 - 100.0 fL   MCH 32.6 26.0 - 34.0 pg   MCHC 34.3 30.0 - 36.0 g/dL   RDW 21.3 08.6 - 57.8 %   Platelets 150 150 - 400 K/uL   nRBC 0.0 0.0 - 0.2 %    Comment: Performed at [facility], [address] [place], [place] [address]  D-dimer, quantitative     Status: None   Collection Time: 05/27/23 11:14 AM  Result Value Ref Range   D-Dimer, Quant 0.27 0.00 - 0.50 ug/mL-FEU    Comment: (NOTE) At the manufacturer cut-off value of 0.5 g/mL FEU, this assay has a negative predictive value of 95-100%.This assay is intended for use in conjunction with a clinical pretest probability (PTP) assessment model to exclude pulmonary embolism (PE) and deep venous thrombosis (DVT) in outpatients suspected of PE or DVT. Results should be correlated with clinical presentation. Performed at [facility], [address] [place], [place] [address]   Troponin I (High Sensitivity)     Status: None   Collection Time: 05/27/23 11:14 AM  Result Value Ref Range   Troponin I (High Sensitivity) 5 <18 ng/L    Comment: (NOTE) Elevated high sensitivity troponin I (hsTnI) values and significant  changes across serial measurements may suggest ACS but many other  chronic and acute conditions are known  to elevate hsTnI results.  Refer to the "Links" section for chest pain algorithms and additional  guidance. Performed at [facility], [address] [place], [place] [address]   Basic metabolic panel     Status: Abnormal   Collection Time: 06/03/23  4:37 PM  Result Value Ref Range   Glucose 127 (H) 70 - 99 mg/dL   BUN 13 6 - 24 mg/dL   Creatinine, Ser 0.10 0.76 - 1.27 mg/dL   eGFR 272 >53 GU/YQI/3.47   BUN/Creatinine Ratio 15 9 - 20   Sodium 140 134 - 144 mmol/L   Potassium 4.3 3.5 - 5.2 mmol/L   Chloride 104 96 - 106 mmol/L   CO2 22 20 - 29 mmol/L   Calcium 9.1 8.7 - 10.2 mg/dL  CBC with Differential/Platelet     Status: Abnormal   Collection Time: 06/03/23  4:37 PM  Result Value Ref Range   WBC 5.9 3.4 - 10.8 x10E3/uL   RBC 4.63 4.14 - 5.80 x10E6/uL   Hemoglobin 15.4 13.0 - 17.7 g/dL   Hematocrit 42.5 95.6 - 51.0 %   MCV 96 79 - 97 fL   MCH 33.3 (H) 26.6 - 33.0 pg   MCHC 34.8 31.5 - 35.7 g/dL   RDW  12.8 11.6 - 15.4 %   Platelets 179 150 - 450 x10E3/uL   Neutrophils 53 Not Estab. %   Lymphs 34 Not Estab. %   Monocytes 9 Not Estab. %   Eos 3 Not Estab. %   Basos 1 Not Estab. %   Neutrophils Absolute 3.1 1.4 - 7.0 x10E3/uL   Lymphocytes Absolute 2.0 0.7 - 3.1 x10E3/uL   Monocytes Absolute 0.5 0.1 - 0.9 x10E3/uL   EOS (ABSOLUTE) 0.2 0.0 - 0.4 x10E3/uL   Basophils Absolute 0.1 0.0 - 0.2 x10E3/uL   Immature Granulocytes 0 Not Estab. %   Immature Grans (Abs) 0.0 0.0 - 0.1 x10E3/uL    Assessment/Plan:  Hypertension associated with diabetes (HCC) blood pressure control important in reducing the progression of atherosclerotic disease. On appropriate oral medications.   Type 2 diabetes mellitus with obesity (HCC) blood glucose control important in reducing the progression of atherosclerotic disease. Also, involved in wound healing. On appropriate medications.   Chronic deep vein thrombosis (DVT) of popliteal vein of left lower extremity (HCC) Patient has now  had 3 DVTs as best I can tell.  There was no clear provocative event for the most 2 recent DVTs to his knowledge.  He is currently on Xarelto at 20 mg daily.  I am going to plan to repeat an ultrasound at his follow-up visit and we can likely discuss reducing to a prophylactic dose of Xarelto or Eliquis at that point.  Carotid artery disease (HCC) The patient has an outside study showing intracranial carotid artery disease.  I would like to get a carotid duplex in the near future at his convenience for evaluation of his cervical carotid artery for significant disease.  He is currently on Xarelto.  Pain in limb The patient has pain and weakness in his legs with walking.  His legs tire easily.  Although this could certainly be attributed to his spastic paraplegia issues, with multiple atherosclerotic risk factors including ongoing tobacco use, it is certainly appropriate to check his arterial perfusion to the lower extremities as well.  ABIs will be done in the near future at his convenience.      Festus Barren 07/01/2023, 10:41 AM   This note was created with Dragon medical transcription system.  Any errors from dictation are unintentional.

## 2023-07-01 NOTE — Telephone Encounter (Signed)
 Requested medication (s) are due for refill today: Yes  Requested medication (s) are on the active medication list: Yes  Last refill:  05/21/23  Future visit scheduled: Yes  Notes to clinic:  Unable to refill per protocol, cannot delegate.      Requested Prescriptions  Pending Prescriptions Disp Refills   clonazePAM (KLONOPIN) 0.5 MG tablet [Pharmacy Med Name: CLONAZEPAM 0.5 MG TABLET] 90 tablet 0    Sig: Take 1 tablet (0.5 mg total)by mouth 3 (three) times daily as needed for anxiety.     Not Delegated - Psychiatry: Anxiolytics/Hypnotics 2 Failed - 07/01/2023  5:26 PM      Failed - This refill cannot be delegated      Passed - Urine Drug Screen completed in last 360 days      Passed - Patient is not pregnant      Passed - Valid encounter within last 6 months    Recent Outpatient Visits           1 month ago Type 2 diabetes mellitus with obesity (HCC)   [place] [facility] [place], Jolene T, NP   4 months ago Acute deep vein thrombosis (DVT) of popliteal vein of left lower extremity (HCC)   [place] [facility] [place], [place] T, NP   5 months ago Depression, major, single episode, moderate (HCC)   [place] [facility] [place], [place] T, NP   8 months ago Type 2 diabetes mellitus with obesity (HCC)   [place] [facility] [place], [place] T, NP   11 months ago Depression, major, single episode, moderate (HCC)   [place] [facility] [place], Dorie Rank, NP       Future Appointments             In 1 month [place], Dorie Rank, NP [place] [facility]

## 2023-07-01 NOTE — Assessment & Plan Note (Signed)
 The patient has an outside study showing intracranial carotid artery disease.  I would like to get a carotid duplex in the near future at his convenience for evaluation of his cervical carotid artery for significant disease.  He is currently on Xarelto.

## 2023-07-01 NOTE — Assessment & Plan Note (Signed)
 blood glucose control important in reducing the progression of atherosclerotic disease. Also, involved in wound healing. On appropriate medications.

## 2023-07-01 NOTE — Assessment & Plan Note (Signed)
 The patient has pain and weakness in his legs with walking.  His legs tire easily.  Although this could certainly be attributed to his spastic paraplegia issues, with multiple atherosclerotic risk factors including ongoing tobacco use, it is certainly appropriate to check his arterial perfusion to the lower extremities as well.  ABIs will be done in the near future at his convenience.

## 2023-07-04 ENCOUNTER — Ambulatory Visit (INDEPENDENT_AMBULATORY_CARE_PROVIDER_SITE_OTHER)

## 2023-07-04 DIAGNOSIS — M79661 Pain in right lower leg: Secondary | ICD-10-CM | POA: Diagnosis not present

## 2023-07-04 DIAGNOSIS — I82532 Chronic embolism and thrombosis of left popliteal vein: Secondary | ICD-10-CM

## 2023-07-04 DIAGNOSIS — M79662 Pain in left lower leg: Secondary | ICD-10-CM | POA: Diagnosis not present

## 2023-07-04 DIAGNOSIS — I779 Disorder of arteries and arterioles, unspecified: Secondary | ICD-10-CM | POA: Diagnosis not present

## 2023-07-07 LAB — VAS US ABI WITH/WO TBI
Left ABI: 0.96
Right ABI: 1.14

## 2023-07-15 ENCOUNTER — Ambulatory Visit (INDEPENDENT_AMBULATORY_CARE_PROVIDER_SITE_OTHER): Admitting: Vascular Surgery

## 2023-07-15 ENCOUNTER — Encounter (INDEPENDENT_AMBULATORY_CARE_PROVIDER_SITE_OTHER): Payer: Self-pay | Admitting: Vascular Surgery

## 2023-07-15 VITALS — BP 136/77 | HR 89 | Resp 18 | Ht 73.0 in | Wt 196.5 lb

## 2023-07-15 DIAGNOSIS — I82532 Chronic embolism and thrombosis of left popliteal vein: Secondary | ICD-10-CM | POA: Diagnosis not present

## 2023-07-15 DIAGNOSIS — I152 Hypertension secondary to endocrine disorders: Secondary | ICD-10-CM | POA: Diagnosis not present

## 2023-07-15 DIAGNOSIS — I779 Disorder of arteries and arterioles, unspecified: Secondary | ICD-10-CM | POA: Diagnosis not present

## 2023-07-15 DIAGNOSIS — E1169 Type 2 diabetes mellitus with other specified complication: Secondary | ICD-10-CM

## 2023-07-15 DIAGNOSIS — M79661 Pain in right lower leg: Secondary | ICD-10-CM

## 2023-07-15 DIAGNOSIS — M79662 Pain in left lower leg: Secondary | ICD-10-CM | POA: Diagnosis not present

## 2023-07-15 DIAGNOSIS — E785 Hyperlipidemia, unspecified: Secondary | ICD-10-CM | POA: Diagnosis not present

## 2023-07-15 DIAGNOSIS — E1159 Type 2 diabetes mellitus with other circulatory complications: Secondary | ICD-10-CM | POA: Diagnosis not present

## 2023-07-15 NOTE — Progress Notes (Unsigned)
 MRN : [id number]  Harold Tate is a 58 y.o. (May 30, 1965) male who presents with chief complaint of No chief complaint on file. Marland Kitchen  History of Present Illness: Patient returns today in follow up of ***  Current Outpatient Medications  Medication Sig Dispense Refill   baclofen (LIORESAL) 10 MG tablet Take 10 mg by mouth 3 (three) times daily.     clonazePAM (KLONOPIN) 0.5 MG tablet Take 1 tablet (0.5 mg total)by mouth 3 (three) times daily as needed for anxiety. 90 tablet 2   ezetimibe (ZETIA) 10 MG tablet Take 1 tablet (10 mg total) by mouth daily. 90 tablet 3   FARXIGA 10 MG TABS tablet Take 10 mg by mouth daily.     rivaroxaban (XARELTO) 20 MG TABS tablet Take 1 tablet (20 mg total) by mouth daily with supper. DO NOT START until finished 21 day starter pack. 30 tablet 4   sildenafil (VIAGRA) 100 MG tablet Take 0.5-1 tablets (50-100 mg total) by mouth daily as needed for erectile dysfunction. 5 tablet 2   No current facility-administered medications for this visit.    Past Medical History:  Diagnosis Date   Anxiety    Arthritis    Diabetes mellitus without complication (HCC)    Patient states A1C has gone down, no longer a diabetic   Dyspnea    Hip pain    Hypertension    Raynaud's syndrome    Sleep apnea     CPAP/ Doesn't use    Past Surgical History:  Procedure Laterality Date   COLONOSCOPY WITH PROPOFOL N/A 12/08/2020   Procedure: COLONOSCOPY WITH PROPOFOL;  Surgeon: Midge Minium, MD;  Location: [facility];  Service: Endoscopy;  Laterality: N/A;   ESOPHAGOGASTRODUODENOSCOPY (EGD) WITH PROPOFOL N/A 12/08/2020   Procedure: ESOPHAGOGASTRODUODENOSCOPY (EGD) WITH PROPOFOL;  Surgeon: Midge Minium, MD;  Location: [facility];  Service: Endoscopy;  Laterality: N/A;   I & D EXTREMITY     OPEN REDUCTION INTERNAL FIXATION (ORIF) TIBIA/FIBULA FRACTURE Left July 2014   POLYPECTOMY N/A 12/08/2020   Procedure: POLYPECTOMY;  Surgeon: Midge Minium, MD;  Location: [facility];  Service: Endoscopy;  Laterality: N/A;   TOTAL HIP ARTHROPLASTY Right 11/03/2018   Procedure: RIGHT TOTAL HIP ARTHROPLASTY ANTERIOR APPROACH;  Surgeon: Kennedy Bucker, MD;  Location: [facility];  Service: Orthopedics;  Laterality: Right;   TOTAL HIP ARTHROPLASTY Left 02/11/2019   Procedure: TOTAL HIP ARTHROPLASTY ANTERIOR APPROACH;  Surgeon: Kennedy Bucker, MD;  Location: [facility];  Service: Orthopedics;  Laterality: Left;     Social History   Tobacco Use   Smoking status: Every Day    Current packs/day: 1.00    Average packs/day: 1 pack/day for 27.0 years (27.0 ttl pk-yrs)    Types: Cigarettes   Smokeless tobacco: Never  Vaping Use   Vaping status: Never Used  Substance Use Topics   Alcohol use: Not Currently   Drug use: Not Currently    Types: Benzodiazepines   ***    Family History  Problem Relation Age of Onset   Depression Sister    ***  Allergies  Allergen Reactions   Metformin And Related Diarrhea   Chlorhexidine Rash     REVIEW OF SYSTEMS (Negative unless checked)  Constitutional: [] Weight loss  [] Fever  [] Chills Cardiac: [] Chest pain   [] Chest pressure   [] Palpitations   [] Shortness of breath when laying flat   [] Shortness of breath at rest   [] Shortness of breath with exertion. Vascular:  [] Pain in legs  with walking   [] Pain in legs at rest   [] Pain in legs when laying flat   [] Claudication   [] Pain in feet when walking  [] Pain in feet at rest  [] Pain in feet when laying flat   [] History of DVT   [] Phlebitis   [] Swelling in legs   [] Varicose veins   [] Non-healing ulcers Pulmonary:   [] Uses home oxygen   [] Productive cough   [] Hemoptysis   [] Wheeze  [] COPD   [] Asthma Neurologic:  [] Dizziness  [] Blackouts   [] Seizures   [] History of stroke   [] History of TIA  [] Aphasia   [] Temporary blindness   [] Dysphagia   [] Weakness or numbness in arms   [] Weakness or numbness in legs Musculoskeletal:  [] Arthritis   [] Joint swelling   [] Joint pain   [] Low back  pain Hematologic:  [] Easy bruising  [] Easy bleeding   [] Hypercoagulable state   [] Anemic   Gastrointestinal:  [] Blood in stool   [] Vomiting blood  [] Gastroesophageal reflux/heartburn   [] Abdominal pain Genitourinary:  [] Chronic kidney disease   [] Difficult urination  [] Frequent urination  [] Burning with urination   [] Hematuria Skin:  [] Rashes   [] Ulcers   [] Wounds Psychological:  [] History of anxiety   []  History of major depression.  Physical Examination  There were no vitals taken for this visit. Gen:  WD/WN, NAD Head: [place]/AT, No temporalis wasting. Ear/Nose/Throat: Hearing grossly intact, nares w/o erythema or drainage Eyes: Conjunctiva clear. Sclera non-icteric Neck: Supple.  Trachea midline Pulmonary:  Good air movement, no use of accessory muscles.  Cardiac: RRR, no JVD Vascular: *** Vessel Right Left  Radial Palpable Palpable                          PT Palpable Palpable  DP Palpable Palpable   Gastrointestinal: soft, non-tender/non-distended. No guarding/reflex.  Musculoskeletal: M/S 5/5 throughout.  No deformity or atrophy. *** edema. Neurologic: Sensation grossly intact in extremities.  Symmetrical.  Speech is fluent.  Psychiatric: Judgment intact, Mood & affect appropriate for pt's clinical situation. Dermatologic: No rashes or ulcers noted.  No cellulitis or open wounds.      Labs Recent Results (from the past 2160 hours)  Bayer DCA Hb A1c Waived     Status: Abnormal   Collection Time: 05/21/23  3:03 PM  Result Value Ref Range   HB A1C (BAYER DCA - WAIVED) 13.4 (H) 4.8 - 5.6 %    Comment:          Prediabetes: 5.7 - 6.4          Diabetes: >6.4          Glycemic control for adults with diabetes: <7.0   Microalbumin, Urine Waived     Status: Abnormal   Collection Time: 05/21/23  3:03 PM  Result Value Ref Range   Microalb, Ur Waived 150 (H) 0 - 19 mg/L   Creatinine, Urine Waived 100 10 - 300 mg/dL   Microalb/Creat Ratio 30-300 (H) <30 mg/g    Comment:                               Abnormal:       30 - 300                         High Abnormal:           >300   CBC with Differential/Platelet  Status: Abnormal   Collection Time: 05/21/23  3:04 PM  Result Value Ref Range   WBC 7.1 3.4 - 10.8 x10E3/uL   RBC 4.92 4.14 - 5.80 x10E6/uL   Hemoglobin 15.8 13.0 - 17.7 g/dL   Hematocrit 16.1 09.6 - 51.0 %   MCV 96 79 - 97 fL   MCH 32.1 26.6 - 33.0 pg   MCHC 33.5 31.5 - 35.7 g/dL   RDW 04.5 40.9 - 81.1 %   Platelets 145 (L) 150 - 450 x10E3/uL   Neutrophils 64 Not Estab. %   Lymphs 27 Not Estab. %   Monocytes 6 Not Estab. %   Eos 2 Not Estab. %   Basos 1 Not Estab. %   Neutrophils Absolute 4.6 1.4 - 7.0 x10E3/uL   Lymphocytes Absolute 2.0 0.7 - 3.1 x10E3/uL   Monocytes Absolute 0.4 0.1 - 0.9 x10E3/uL   EOS (ABSOLUTE) 0.1 0.0 - 0.4 x10E3/uL   Basophils Absolute 0.1 0.0 - 0.2 x10E3/uL   Immature Granulocytes 0 Not Estab. %   Immature Grans (Abs) 0.0 0.0 - 0.1 x10E3/uL  Comprehensive metabolic panel     Status: Abnormal   Collection Time: 05/21/23  3:04 PM  Result Value Ref Range   Glucose 333 (H) 70 - 99 mg/dL   BUN 7 6 - 24 mg/dL   Creatinine, Ser 9.14 0.76 - 1.27 mg/dL   eGFR 782 >95 AO/ZHY/8.65   BUN/Creatinine Ratio 9 9 - 20   Sodium 135 134 - 144 mmol/L   Potassium 4.3 3.5 - 5.2 mmol/L   Chloride 95 (L) 96 - 106 mmol/L   CO2 26 20 - 29 mmol/L   Calcium 9.5 8.7 - 10.2 mg/dL   Total Protein 6.9 6.0 - 8.5 g/dL   Albumin 4.2 3.8 - 4.9 g/dL   Globulin, Total 2.7 1.5 - 4.5 g/dL   Bilirubin Total 0.9 0.0 - 1.2 mg/dL   Alkaline Phosphatase 140 (H) 44 - 121 IU/L   AST 11 0 - 40 IU/L   ALT 17 0 - 44 IU/L  Lipid Panel w/o Chol/HDL Ratio     Status: Abnormal   Collection Time: 05/21/23  3:04 PM  Result Value Ref Range   Cholesterol, Total 181 100 - 199 mg/dL   Triglycerides 784 (H) 0 - 149 mg/dL   HDL 41 >69 mg/dL   VLDL Cholesterol Cal 34 5 - 40 mg/dL   LDL Chol Calc (NIH) 629 (H) 0 - 99 mg/dL  TSH     Status: None   Collection  Time: 05/21/23  3:04 PM  Result Value Ref Range   TSH 1.200 0.450 - 4.500 uIU/mL  PSA     Status: None   Collection Time: 05/21/23  3:04 PM  Result Value Ref Range   Prostate Specific Ag, Serum 0.4 0.0 - 4.0 ng/mL    Comment: Roche ECLIA methodology. According to the American Urological Association, Serum PSA should decrease and remain at undetectable levels after radical prostatectomy. The AUA defines biochemical recurrence as an initial PSA value 0.2 ng/mL or greater followed by a subsequent confirmatory PSA value 0.2 ng/mL or greater. Values obtained with different assay methods or kits cannot be used interchangeably. Results cannot be interpreted as absolute evidence of the presence or absence of malignant disease.   Vitamin B12     Status: None   Collection Time: 05/21/23  3:04 PM  Result Value Ref Range   Vitamin B-12 511 232 - 1,245 pg/mL  CBG monitoring, ED  Status: Abnormal   Collection Time: 05/27/23 11:11 AM  Result Value Ref Range   Glucose-Capillary 130 (H) 70 - 99 mg/dL    Comment: Glucose reference range applies only to samples taken after fasting for at least 8 hours.  Urinalysis, Routine w reflex microscopic -Urine, Clean Catch     Status: Abnormal   Collection Time: 05/27/23 11:13 AM  Result Value Ref Range   Color, Urine YELLOW (A) YELLOW   APPearance CLEAR (A) CLEAR   Specific Gravity, Urine 1.031 (H) 1.005 - 1.030   pH 6.0 5.0 - 8.0   Glucose, UA >=500 (A) NEGATIVE mg/dL   Hgb urine dipstick NEGATIVE NEGATIVE   Bilirubin Urine NEGATIVE NEGATIVE   Ketones, ur NEGATIVE NEGATIVE mg/dL   Protein, ur NEGATIVE NEGATIVE mg/dL   Nitrite NEGATIVE NEGATIVE   Leukocytes,Ua NEGATIVE NEGATIVE   RBC / HPF 0-5 0 - 5 RBC/hpf   WBC, UA 0-5 0 - 5 WBC/hpf   Bacteria, UA NONE SEEN NONE SEEN   Squamous Epithelial / HPF 0-5 0 - 5 /HPF    Comment: Performed at [facility], [address] [place], [place] [address]  Basic metabolic panel     Status: Abnormal    Collection Time: 05/27/23 11:14 AM  Result Value Ref Range   Sodium 137 135 - 145 mmol/L   Potassium 3.9 3.5 - 5.1 mmol/L   Chloride 103 98 - 111 mmol/L   CO2 26 22 - 32 mmol/L   Glucose, Bld 154 (H) 70 - 99 mg/dL    Comment: Glucose reference range applies only to samples taken after fasting for at least 8 hours.   BUN 13 6 - 20 mg/dL   Creatinine, Ser 6.04 0.61 - 1.24 mg/dL   Calcium 8.9 8.9 - 54.0 mg/dL   GFR, Estimated >98 >11 mL/min    Comment: (NOTE) Calculated using the CKD-EPI Creatinine Equation (2021)    Anion gap 8 5 - 15    Comment: Performed at [facility], [address] [place], [place] [address]  CBC     Status: Abnormal   Collection Time: 05/27/23 11:14 AM  Result Value Ref Range   WBC 10.9 (H) 4.0 - 10.5 K/uL   RBC 4.84 4.22 - 5.81 MIL/uL   Hemoglobin 15.8 13.0 - 17.0 g/dL   HCT 29.5 62.1 - 30.8 %   MCV 95.0 80.0 - 100.0 fL   MCH 32.6 26.0 - 34.0 pg   MCHC 34.3 30.0 - 36.0 g/dL   RDW 65.7 84.6 - 96.2 %   Platelets 150 150 - 400 K/uL   nRBC 0.0 0.0 - 0.2 %    Comment: Performed at [facility], [address] [place], [place] [address]  D-dimer, quantitative     Status: None   Collection Time: 05/27/23 11:14 AM  Result Value Ref Range   D-Dimer, Quant 0.27 0.00 - 0.50 ug/mL-FEU    Comment: (NOTE) At the manufacturer cut-off value of 0.5 g/mL FEU, this assay has a negative predictive value of 95-100%.This assay is intended for use in conjunction with a clinical pretest probability (PTP) assessment model to exclude pulmonary embolism (PE) and deep venous thrombosis (DVT) in outpatients suspected of PE or DVT. Results should be correlated with clinical presentation. Performed at [facility], [address] [place], [place] [address]   Troponin I (High Sensitivity)     Status: None   Collection Time: 05/27/23 11:14 AM  Result Value Ref Range   Troponin I (  High Sensitivity) 5 <18 ng/L    Comment: (NOTE) Elevated high  sensitivity troponin I (hsTnI) values and significant  changes across serial measurements may suggest ACS but many other  chronic and acute conditions are known to elevate hsTnI results.  Refer to the "Links" section for chest pain algorithms and additional  guidance. Performed at [facility], [address] [place], [place] [address]   Basic metabolic panel     Status: Abnormal   Collection Time: 06/03/23  4:37 PM  Result Value Ref Range   Glucose 127 (H) 70 - 99 mg/dL   BUN 13 6 - 24 mg/dL   Creatinine, Ser 6.21 0.76 - 1.27 mg/dL   eGFR 308 >65 HQ/ION/6.29   BUN/Creatinine Ratio 15 9 - 20   Sodium 140 134 - 144 mmol/L   Potassium 4.3 3.5 - 5.2 mmol/L   Chloride 104 96 - 106 mmol/L   CO2 22 20 - 29 mmol/L   Calcium 9.1 8.7 - 10.2 mg/dL  CBC with Differential/Platelet     Status: Abnormal   Collection Time: 06/03/23  4:37 PM  Result Value Ref Range   WBC 5.9 3.4 - 10.8 x10E3/uL   RBC 4.63 4.14 - 5.80 x10E6/uL   Hemoglobin 15.4 13.0 - 17.7 g/dL   Hematocrit 52.8 41.3 - 51.0 %   MCV 96 79 - 97 fL   MCH 33.3 (H) 26.6 - 33.0 pg   MCHC 34.8 31.5 - 35.7 g/dL   RDW 24.4 01.0 - 27.2 %   Platelets 179 150 - 450 x10E3/uL   Neutrophils 53 Not Estab. %   Lymphs 34 Not Estab. %   Monocytes 9 Not Estab. %   Eos 3 Not Estab. %   Basos 1 Not Estab. %   Neutrophils Absolute 3.1 1.4 - 7.0 x10E3/uL   Lymphocytes Absolute 2.0 0.7 - 3.1 x10E3/uL   Monocytes Absolute 0.5 0.1 - 0.9 x10E3/uL   EOS (ABSOLUTE) 0.2 0.0 - 0.4 x10E3/uL   Basophils Absolute 0.1 0.0 - 0.2 x10E3/uL   Immature Granulocytes 0 Not Estab. %   Immature Grans (Abs) 0.0 0.0 - 0.1 x10E3/uL  VAS [place] ABI WITH/WO TBI     Status: None   Collection Time: 07/04/23  9:46 AM  Result Value Ref Range   Right ABI 1.14    Left ABI 0.96     Radiology VAS US CAROTID Result Date: 07/07/2023 Carotid Arterial Duplex Study Patient Name:  ARMANI BRAR  Date of Exam:   07/04/2023 Medical Rec #: [id number]       Accession #:     [id number] Date of Birth: 1965/07/31       Patient Gender: M Patient Age:   47 years Exam Location:  [facility] Procedure:      VAS US CAROTID Referring Phys: Festus Barren --------------------------------------------------------------------------------  Indications:  CVA. Risk Factors: Hypertension, Diabetes. Performing Technologist: Hardie Lora RVT  Examination Guidelines: A complete evaluation includes B-mode imaging, spectral Doppler, color Doppler, and power Doppler as needed of all accessible portions of each vessel. Bilateral testing is considered an integral part of a complete examination. Limited examinations for reoccurring indications may be performed as noted.  Right Carotid Findings: +----------+--------+--------+--------+------------------+--------+           PSV cm/sEDV cm/sStenosisPlaque DescriptionComments +----------+--------+--------+--------+------------------+--------+ CCA Prox  114     21                                         +----------+--------+--------+--------+------------------+--------+  CCA Mid   83      22                                         +----------+--------+--------+--------+------------------+--------+ CCA Distal92      24      <50%    calcific                   +----------+--------+--------+--------+------------------+--------+ ICA Prox  81      25      1-39%   calcific                   +----------+--------+--------+--------+------------------+--------+ ICA Mid   100     37                                         +----------+--------+--------+--------+------------------+--------+ ICA Distal85      25                                         +----------+--------+--------+--------+------------------+--------+ ECA       111     16                                         +----------+--------+--------+--------+------------------+--------+ +----------+--------+-------+----------------+-------------------+            PSV cm/sEDV cmsDescribe        Arm Pressure (mmHG) +----------+--------+-------+----------------+-------------------+ Subclavian110            Multiphasic, WNL                    +----------+--------+-------+----------------+-------------------+ +---------+--------+--+--------+-+---------+ VertebralPSV cm/s32EDV cm/s9Antegrade +---------+--------+--+--------+-+---------+  Left Carotid Findings: +----------+--------+--------+--------+-------------------+--------+           PSV cm/sEDV cm/sStenosisPlaque Description Comments +----------+--------+--------+--------+-------------------+--------+ CCA Prox  93      25      <50%    calcific                    +----------+--------+--------+--------+-------------------+--------+ CCA Mid   106     30                                          +----------+--------+--------+--------+-------------------+--------+ CCA Distal75      27                                          +----------+--------+--------+--------+-------------------+--------+ ICA Prox  94      40      40-59%  smooth and calcific         +----------+--------+--------+--------+-------------------+--------+ ICA Mid   105     42      40-59%                              +----------+--------+--------+--------+-------------------+--------+ ICA Distal94      36                                          +----------+--------+--------+--------+-------------------+--------+  ECA       99      16                                          +----------+--------+--------+--------+-------------------+--------+ +----------+--------+--------+----------------+-------------------+           PSV cm/sEDV cm/sDescribe        Arm Pressure (mmHG) +----------+--------+--------+----------------+-------------------+ [id number]              Multiphasic, WNL                    +----------+--------+--------+----------------+-------------------+  +---------+--------+--+--------+--+---------+ VertebralPSV cm/s39EDV cm/s14Antegrade +---------+--------+--+--------+--+---------+   Summary: Right Carotid: Velocities in the right ICA are consistent with a 1-39% stenosis.                Non-hemodynamically significant plaque <50% noted in the CCA. Left Carotid: Velocities in the left ICA are consistent with a 40-59% stenosis.               Non-hemodynamically significant plaque <50% noted in the CCA. Vertebrals:  Bilateral vertebral arteries demonstrate antegrade flow. Subclavians: Normal flow hemodynamics were seen in bilateral subclavian              arteries. *See table(s) above for measurements and observations.  Electronically signed by Festus Barren MD on 07/07/2023 at 1:40:10 PM.    Final    VAS [place] ABI WITH/WO TBI Result Date: 07/07/2023  LOWER EXTREMITY DOPPLER STUDY Patient Name:  Harold Tate  Date of Exam:   07/04/2023 Medical Rec #: [id number]       Accession #:    [id number] Date of Birth: Apr 20, 1966       Patient Gender: M Patient Age:   68 years Exam Location:  [facility] Procedure:      VAS [place] ABI WITH/WO TBI Referring Phys: Barbara Cower Felisha Claytor --------------------------------------------------------------------------------  Indications: Peripheral artery disease. High Risk Factors: Hypertension, hyperlipidemia, Diabetes, current smoker, prior                    CVA.  Performing Technologist: Hardie Lora RVT  Examination Guidelines: A complete evaluation includes at minimum, Doppler waveform signals and systolic blood pressure reading at the level of bilateral brachial, anterior tibial, and posterior tibial arteries, when vessel segments are accessible. Bilateral testing is considered an integral part of a complete examination. Photoelectric Plethysmograph (PPG) waveforms and toe systolic pressure readings are included as required and additional duplex testing as needed. Limited examinations for reoccurring indications may be performed as  noted.  ABI Findings: +---------+------------------+-----+---------+--------+ Right    Rt Pressure (mmHg)IndexWaveform Comment  +---------+------------------+-----+---------+--------+ Brachial 119                                      +---------+------------------+-----+---------+--------+ PTA      127               1.07 triphasic         +---------+------------------+-----+---------+--------+ DP       136               1.14 triphasic         +---------+------------------+-----+---------+--------+ Great Toe94                0.79                   +---------+------------------+-----+---------+--------+ +---------+------------------+-----+---------+-------+  Left     Lt Pressure (mmHg)IndexWaveform Comment +---------+------------------+-----+---------+-------+ Brachial 117                                     +---------+------------------+-----+---------+-------+ PTA      80                0.67 triphasic        +---------+------------------+-----+---------+-------+ PERO     115               0.97 triphasic        +---------+------------------+-----+---------+-------+ DP       114               0.96 triphasic        +---------+------------------+-----+---------+-------+ Great Toe98                0.82                  +---------+------------------+-----+---------+-------+ +-------+-----------+-----------+------------+------------+ ABI/TBIToday's ABIToday's TBIPrevious ABIPrevious TBI +-------+-----------+-----------+------------+------------+ Right  1.14       0.79                                +-------+-----------+-----------+------------+------------+ Left   0.96       0.82                                +-------+-----------+-----------+------------+------------+  Summary: Right: Resting right ankle-brachial index is within normal range. The right toe-brachial index is normal. Left: Resting left ankle-brachial index is within normal  range. The left toe-brachial index is normal. *See table(s) above for measurements and observations.  Electronically signed by Festus Barren MD on 07/07/2023 at 1:40:01 PM.    Final    VAS [place] LOWER EXTREMITY VENOUS (DVT) Result Date: 07/07/2023  Lower Venous DVT Study Patient Name:  Harold Tate  Date of Exam:   07/04/2023 Medical Rec #: [id number]       Accession #:    [id number] Date of Birth: 11-Jan-1966       Patient Gender: M Patient Age:   48 years Exam Location:  [facility] Procedure:      VAS [place] LOWER EXTREMITY VENOUS (DVT) Referring Phys: Festus Barren --------------------------------------------------------------------------------  Indications: Swelling, and Pain.  Risk Factors: DVT History of bilateral popliteal DVT. Anticoagulation: Xarelto. Performing Technologist: Hardie Lora RVT  Examination Guidelines: A complete evaluation includes B-mode imaging, spectral Doppler, color Doppler, and power Doppler as needed of all accessible portions of each vessel. Bilateral testing is considered an integral part of a complete examination. Limited examinations for reoccurring indications may be performed as noted. The reflux portion of the exam is performed with the patient in reverse Trendelenburg.  +---------+---------------+---------+-----------+----------+--------------+ RIGHT    CompressibilityPhasicitySpontaneityPropertiesThrombus Aging +---------+---------------+---------+-----------+----------+--------------+ CFV      Full                                                        +---------+---------------+---------+-----------+----------+--------------+ SFJ      Full                                                        +---------+---------------+---------+-----------+----------+--------------+  FV Prox  Full                                                        +---------+---------------+---------+-----------+----------+--------------+ FV Mid   Full                                                         +---------+---------------+---------+-----------+----------+--------------+ FV DistalFull                                                        +---------+---------------+---------+-----------+----------+--------------+ POP      Full                                                        +---------+---------------+---------+-----------+----------+--------------+ PTV      Full                                                        +---------+---------------+---------+-----------+----------+--------------+ PERO     Full                                                        +---------+---------------+---------+-----------+----------+--------------+ GSV      Full                                                        +---------+---------------+---------+-----------+----------+--------------+ SSV      Full                                                        +---------+---------------+---------+-----------+----------+--------------+ RIght GSV demonstrates reflux > 500 msec.  +---------+---------------+---------+-----------+----------+--------------+ LEFT     CompressibilityPhasicitySpontaneityPropertiesThrombus Aging +---------+---------------+---------+-----------+----------+--------------+ CFV      Full           Yes      Yes                                 +---------+---------------+---------+-----------+----------+--------------+ SFJ      Full           Yes      Yes                                 +---------+---------------+---------+-----------+----------+--------------+  FV Prox  Full           Yes      Yes                                 +---------+---------------+---------+-----------+----------+--------------+ FV Mid   Full           Yes      Yes                                 +---------+---------------+---------+-----------+----------+--------------+ FV DistalFull           Yes      Yes                                  +---------+---------------+---------+-----------+----------+--------------+ POP      Partial        Yes      Yes        retracted Chronic        +---------+---------------+---------+-----------+----------+--------------+ PTV      Full                                                        +---------+---------------+---------+-----------+----------+--------------+ GSV      Full                    Yes                                 +---------+---------------+---------+-----------+----------+--------------+ SSV      Full                                                        +---------+---------------+---------+-----------+----------+--------------+     Summary: RIGHT: - There is no evidence of deep vein thrombosis in the lower extremity. - There is no evidence of superficial venous thrombosis.  - No cystic structure found in the popliteal fossa.  LEFT: - There is recanalized thrombus in the left popliteal vein(s).  - Findings consistent with chronic deep vein thrombosis involving the left popliteal vein.  - There is no evidence of superficial venous thrombosis.  *See table(s) above for measurements and observations. Electronically signed by Festus Barren MD on 07/07/2023 at 1:39:46 PM.    Final     Assessment/Plan  No problem-specific Assessment & Plan notes found for this encounter.    Festus Barren, MD  07/15/2023 3:18 PM    This note was created with Dragon medical transcription system.  Any errors from dictation are purely unintentional

## 2023-07-18 NOTE — Assessment & Plan Note (Signed)
 Carotid duplex showed velocities in the 1 to 39% range on the right and just into the 40 to 59% range on the left.

## 2023-07-18 NOTE — Assessment & Plan Note (Signed)
 ABIs showed normal triphasic waveforms with normal ABIs bilaterally of greater than 0.9.  Venous study showed only chronic DVT in the left popliteal vein.  No acute DVT or superficial thrombophlebitis was identified.  There could be a component of postphlebitic disease of the lower extremities, but it sounds like his leg discomfort is less likely vascular and likely musculoskeletal.

## 2023-07-18 NOTE — Assessment & Plan Note (Signed)
 Duplex today shows only chronic DVT in the left popliteal vein.  No acute DVT is identified.  He is currently on Xarelto.  He has had multiple previous thrombotic episodes in the past.  I think it is reasonable to continue on indefinite anticoagulation.  He seems interested in considering aspirin therapy which would be the traditional and technically okay way to manage this, with multiple previous DVTs he is at higher risk of recurrent DVT without anticoagulation.  I would at least consider a lower dose of Xarelto which I do think would be reasonable for a prophylactic treatment choice.  If he develops significant pain or swelling, he should wear compression socks regularly to avoid postphlebitic symptoms.

## 2023-07-18 NOTE — Assessment & Plan Note (Signed)
 blood pressure control important in reducing the progression of atherosclerotic disease. On appropriate oral medications.

## 2023-07-18 NOTE — Assessment & Plan Note (Signed)
 lipid control important in reducing the progression of atherosclerotic disease. Continue statin therapy

## 2023-08-15 ENCOUNTER — Ambulatory Visit: Admitting: Nurse Practitioner

## 2023-08-24 DIAGNOSIS — E119 Type 2 diabetes mellitus without complications: Secondary | ICD-10-CM | POA: Insufficient documentation

## 2023-08-27 ENCOUNTER — Ambulatory Visit: Payer: 59 | Admitting: Nurse Practitioner

## 2023-08-27 DIAGNOSIS — I700 Atherosclerosis of aorta: Secondary | ICD-10-CM

## 2023-08-27 DIAGNOSIS — I82532 Chronic embolism and thrombosis of left popliteal vein: Secondary | ICD-10-CM

## 2023-08-27 DIAGNOSIS — F17210 Nicotine dependence, cigarettes, uncomplicated: Secondary | ICD-10-CM

## 2023-08-27 DIAGNOSIS — F321 Major depressive disorder, single episode, moderate: Secondary | ICD-10-CM

## 2023-08-27 DIAGNOSIS — E119 Type 2 diabetes mellitus without complications: Secondary | ICD-10-CM

## 2023-08-27 DIAGNOSIS — F411 Generalized anxiety disorder: Secondary | ICD-10-CM

## 2023-08-27 DIAGNOSIS — K7469 Other cirrhosis of liver: Secondary | ICD-10-CM

## 2023-08-27 DIAGNOSIS — I6523 Occlusion and stenosis of bilateral carotid arteries: Secondary | ICD-10-CM

## 2023-08-27 DIAGNOSIS — G114 Hereditary spastic paraplegia: Secondary | ICD-10-CM

## 2023-08-27 DIAGNOSIS — D696 Thrombocytopenia, unspecified: Secondary | ICD-10-CM

## 2023-08-27 DIAGNOSIS — E1159 Type 2 diabetes mellitus with other circulatory complications: Secondary | ICD-10-CM

## 2023-08-27 DIAGNOSIS — Z79899 Other long term (current) drug therapy: Secondary | ICD-10-CM

## 2023-08-27 DIAGNOSIS — E1169 Type 2 diabetes mellitus with other specified complication: Secondary | ICD-10-CM

## 2023-08-30 NOTE — Patient Instructions (Signed)

## 2023-09-02 ENCOUNTER — Encounter: Payer: Self-pay | Admitting: Nurse Practitioner

## 2023-09-02 ENCOUNTER — Ambulatory Visit (INDEPENDENT_AMBULATORY_CARE_PROVIDER_SITE_OTHER): Admitting: Nurse Practitioner

## 2023-09-02 VITALS — BP 125/67 | HR 67 | Temp 97.7°F | Ht 73.0 in | Wt 186.4 lb

## 2023-09-02 DIAGNOSIS — E1169 Type 2 diabetes mellitus with other specified complication: Secondary | ICD-10-CM | POA: Diagnosis not present

## 2023-09-02 DIAGNOSIS — Z7984 Long term (current) use of oral hypoglycemic drugs: Secondary | ICD-10-CM | POA: Diagnosis not present

## 2023-09-02 DIAGNOSIS — F321 Major depressive disorder, single episode, moderate: Secondary | ICD-10-CM

## 2023-09-02 DIAGNOSIS — E1159 Type 2 diabetes mellitus with other circulatory complications: Secondary | ICD-10-CM

## 2023-09-02 DIAGNOSIS — K7469 Other cirrhosis of liver: Secondary | ICD-10-CM

## 2023-09-02 DIAGNOSIS — F411 Generalized anxiety disorder: Secondary | ICD-10-CM

## 2023-09-02 DIAGNOSIS — E119 Type 2 diabetes mellitus without complications: Secondary | ICD-10-CM | POA: Diagnosis not present

## 2023-09-02 DIAGNOSIS — Z79899 Other long term (current) drug therapy: Secondary | ICD-10-CM

## 2023-09-02 DIAGNOSIS — I82532 Chronic embolism and thrombosis of left popliteal vein: Secondary | ICD-10-CM

## 2023-09-02 DIAGNOSIS — E669 Obesity, unspecified: Secondary | ICD-10-CM | POA: Diagnosis not present

## 2023-09-02 DIAGNOSIS — G114 Hereditary spastic paraplegia: Secondary | ICD-10-CM

## 2023-09-02 DIAGNOSIS — F17210 Nicotine dependence, cigarettes, uncomplicated: Secondary | ICD-10-CM

## 2023-09-02 DIAGNOSIS — I6523 Occlusion and stenosis of bilateral carotid arteries: Secondary | ICD-10-CM

## 2023-09-02 DIAGNOSIS — E785 Hyperlipidemia, unspecified: Secondary | ICD-10-CM | POA: Diagnosis not present

## 2023-09-02 DIAGNOSIS — D696 Thrombocytopenia, unspecified: Secondary | ICD-10-CM | POA: Diagnosis not present

## 2023-09-02 DIAGNOSIS — I152 Hypertension secondary to endocrine disorders: Secondary | ICD-10-CM

## 2023-09-02 LAB — BAYER DCA HB A1C WAIVED: HB A1C (BAYER DCA - WAIVED): 5.5 % (ref 4.8–5.6)

## 2023-09-02 MED ORDER — CLONAZEPAM 0.5 MG PO TABS
0.5000 mg | ORAL_TABLET | Freq: Three times a day (TID) | ORAL | 2 refills | Status: DC | PRN
Start: 2023-09-26 — End: 2023-11-14

## 2023-09-02 MED ORDER — APIXABAN 2.5 MG PO TABS: 2.5000 mg | ORAL_TABLET | Freq: Two times a day (BID) | ORAL | 12 refills | Status: DC

## 2023-09-02 NOTE — Assessment & Plan Note (Signed)
 Refer to anxiety plan of care.

## 2023-09-02 NOTE — Assessment & Plan Note (Signed)
Chronic, ongoing.  Denies SI/HI.   Refills sent on Klonopin, refer to anxiety plan.  Have recommended psychiatry visit, he refuses.  Has failed multiple maintenance medications. 

## 2023-09-02 NOTE — Progress Notes (Signed)
 BP 125/67   Pulse 67   Temp 97.7 F (36.5 C) (Oral)   Ht 6\' 1"  (1.854 m)   Wt 186 lb 6.4 oz (84.6 kg)   SpO2 94%   BMI 24.59 kg/m    Subjective:    Patient ID: Harold Tate, male    DOB: 12-23-1965, 58 y.o.   MRN: [id number]  HPI: Harold Tate is a 58 y.o. male  Chief Complaint  Patient presents with   Anxiety   Diabetes   Hyperlipidemia   Hypertension   DIABETES A1c in January was 13.4%.  Had been off medications >1 year with A1c in 5-6 range, but had poor diet changes and this led to BS elevations. We started Farxiga  in January, which he is tolerating -- does endorse missing many doses and not consistently taking. Had not been above goal since 04/26/2021 when was 9.1%. Has lost 10 lbs since last visit, has cut out regular sodas and is back to working on diet and exercise. Hypoglycemic episodes:no Polydipsia/polyuria: no Visual disturbance: no Chest pain: no Paresthesias: no Glucose Monitoring: no  Accucheck frequency: Not Checking  Fasting glucose:  Post prandial:  Evening:  Before meals: Taking Insulin?: no  Long acting insulin:  Short acting insulin: Blood Pressure Monitoring: not checking Retinal Examination: Not up to Date Foot Exam: Up to Date Pneumovax: Not up to Date -- refuses Influenza: Not up to Date -- refuses Aspirin: no   HYPERTENSION / HYPERLIPIDEMIA/ CIRRHOSIS + DVT No medications for BP and refuses statin therapy, took Rosuvastatin  in past with muscle aches even on weekly basis.  He did start Zetia  last visit. Aortic atherosclerosis on imaging 12/27/20.  DVT 01/31/23 - history of same in past with negative hematology work-up in 2021. To stay on lifelong Xarelto , although on review he is not taking.  Saw vascular on 07/15/23 and they recommend he be on indefinitely.  He was to [facility] with [facility] Pharm[facility] Bartholomew Light on cost.  Followed by [facility] neurology for hereditary spastic paraparesis neurology on 09/27/22. At visit they ordered a sleep study+ were  concerned that Baclofen  and Klonopin  together caused his increased fatigue at times.   Followed by GI, last visit 07/24/21, ultrasound on 09/16/22 was stable. To have repeat in 6 months, appears to have missed this. Denies heavy alcohol use, no current use of any alcohol.  Continues to smoke, smokes about 1 PPD. Smoked since he was 58 years old.   Satisfied with current treatment? yes Duration of hyperlipidemia: chronic Cholesterol medication side effects: no Cholesterol supplements: none Medication compliance: good compliance Aspirin: no Recent stressors: no Recurrent headaches: no Visual changes: no Palpitations: no Dyspnea: no Chest pain: no Lower extremity edema: no Dizzy/lightheaded: no   ANXIETY/STRESS Continues Clonazepam  0.5 MG TID ordered by previous PCP, Dr. Micaela Addison. Been on this for several years. Tried Xanax in past but was changed as anxiety became worse with this. Pt is aware of risks of benzo medication use to include increased sedation, respiratory suppression, falls, extrapyramidal movements, dependence and cardiovascular events. Pt would like to continue treatment as benefit determined to outweigh risk.    In past tried Wellbutrin, Celexa , and Prozac  in past without success. Wellbutrin caused weird thoughts. PDMP last Klonopin  fill was 08/29/23. No other recent controlled substance fills.   Duration:stable Anxious mood: yes  Excessive worrying: no Irritability: no  Sweating: no Nausea: no Palpitations:no Hyperventilation: no Panic attacks: no Agoraphobia: no  Obscessions/compulsions: no Depressed mood: no    09/02/2023  1:25 PM 05/21/2023    3:04 PM 02/10/2023   10:27 AM 01/09/2023    9:08 AM 10/09/2022    8:59 AM  Depression screen PHQ 2/9  Decreased Interest 0 1 0 0 0  Down, Depressed, Hopeless 0 0 0 0 0  PHQ - 2 Score 0 1 0 0 0  Altered sleeping 0 0 0 0 0  Tired, decreased energy 0 3 0 0 0  Change in appetite 0 0 3 0 0  Feeling bad or failure about  yourself  0 1 0 0 0  Trouble concentrating 0 0 0 0 0  Moving slowly or fidgety/restless 0 0 0 0 0  Suicidal thoughts 0 0 0 0 0  PHQ-9 Score 0 5 3 0 0  Difficult doing work/chores Not difficult at all Somewhat difficult  Not difficult at all   Anhedonia: no Weight changes: no Insomnia: none Hypersomnia: no Fatigue/loss of energy: no Feelings of worthlessness: no Feelings of guilt: no Impaired concentration/indecisiveness: no Suicidal ideations: no  Crying spells: no Recent Stressors/Life Changes: no   Relationship problems: no   Family stress: no     Financial stress: no    Job stress: no    Recent death/loss: no     09-08-2023    1:25 PM 05/21/2023    3:05 PM 02/10/2023   10:28 AM 01/09/2023    9:10 AM  GAD 7 : Generalized Anxiety Score  Nervous, Anxious, on Edge 1 3 3 3   Control/stop worrying 0 1 0 2  Worry too much - different things 0 3 0 2  Trouble relaxing 0 0 0 0  Restless 0 0 0 0  Easily annoyed or irritable 0 0 0 0  Afraid - awful might happen 0 1 0 0  Total GAD 7 Score 1 8 3 7   Anxiety Difficulty Not difficult at all Somewhat difficult  Not difficult at all   Relevant past medical, surgical, family and social history reviewed and updated as indicated. Interim medical history since our last visit reviewed. Allergies and medications reviewed and updated.  Review of Systems  Constitutional:  Negative for activity change, diaphoresis, fatigue and fever.  Respiratory:  Negative for cough, chest tightness, shortness of breath and wheezing.   Cardiovascular:  Negative for chest pain, palpitations and leg swelling.  Gastrointestinal: Negative.   Endocrine: Negative for cold intolerance, heat intolerance, polydipsia, polyphagia and polyuria.  Neurological: Negative.   Psychiatric/Behavioral: Negative.     Per HPI unless specifically indicated above     Objective:    BP 125/67   Pulse 67   Temp 97.7 F (36.5 C) (Oral)   Ht 6\' 1"  (1.854 m)   Wt 186 lb 6.4 oz  (84.6 kg)   SpO2 94%   BMI 24.59 kg/m   Wt Readings from Last 3 Encounters:  2023-09-08 186 lb 6.4 oz (84.6 kg)  07/15/23 196 lb 8 oz (89.1 kg)  07/01/23 196 lb 3.2 oz (89 kg)    Physical Exam Vitals and nursing note reviewed.  Constitutional:      General: He is awake. He is not in acute distress.    Appearance: He is well-developed and well-groomed. He is not ill-appearing or toxic-appearing.  HENT:     Head: Normocephalic.     Right Ear: Hearing and external ear normal.     Left Ear: Hearing and external ear normal.  Eyes:     General: Lids are normal.     Extraocular Movements: Extraocular movements  intact.     Conjunctiva/sclera: Conjunctivae normal.  Neck:     Thyroid: No thyromegaly.     Vascular: No carotid bruit.  Cardiovascular:     Rate and Rhythm: Normal rate and regular rhythm.     Heart sounds: Normal heart sounds. No murmur heard.    No gallop.  Pulmonary:     Effort: No accessory muscle usage or respiratory distress.     Breath sounds: Normal breath sounds.  Abdominal:     General: Bowel sounds are normal. There is no distension.     Palpations: Abdomen is soft.     Tenderness: There is no abdominal tenderness.  Musculoskeletal:     Cervical back: Full passive range of motion without pain.     Right lower leg: No edema.     Left lower leg: No edema.  Lymphadenopathy:     Cervical: No cervical adenopathy.  Skin:    General: Skin is warm.     Capillary Refill: Capillary refill takes less than 2 seconds.  Neurological:     Mental Status: He is alert and oriented to person, place, and time.     Deep Tendon Reflexes: Reflexes are normal and symmetric.     Reflex Scores:      Brachioradialis reflexes are 2+ on the right side and 2+ on the left side.      Patellar reflexes are 2+ on the right side and 2+ on the left side. Psychiatric:        Attention and Perception: Attention normal.        Mood and Affect: Mood normal.        Speech: Speech normal.         Behavior: Behavior normal. Behavior is cooperative.        Thought Content: Thought content normal.    Results for orders placed or performed in visit on 07/04/23  VAS US  ABI WITH/WO TBI   Collection Time: 07/04/23  9:46 AM  Result Value Ref Range   Right ABI 1.14    Left ABI 0.96       Assessment & Plan:   Problem List Items Addressed This Visit       Cardiovascular and Mediastinum   Hypertension associated with diabetes (HCC)   Chronic, stable without medications.  BP at goal in office today.  Recommend he monitor BP at least a few mornings a week at home and document.  DASH diet at home.  Continue current medication regimen and adjust as needed.  Urine ALB 150 January 2025, refuses ACE/ARB. Labs today: CMP and CBC.      Relevant Medications   FARXIGA  10 MG TABS tablet   apixaban (ELIQUIS) 2.5 MG TABS tablet   Other Relevant Orders   Bayer DCA Hb A1c Waived   Comprehensive metabolic panel with GFR   Chronic deep vein thrombosis (DVT) of popliteal vein of left lower extremity (HCC)   Chronic, history of DVTs in the past.  Dicussed at length with him he will need to be on lifelong anticoagulant, this was recommended by hematology in the past.  Will reach out to [facility] Pharm[facility] about him not taking recently.  Will send in Eliquis 2.5 MG BID which appears to be covered, as cost was concern with Xarelto . Continue collaboration with vascular. Recommend complete cessation of smoking.      Relevant Medications   apixaban (ELIQUIS) 2.5 MG TABS tablet     Digestive   Cirrhosis of liver (HCC)   Chronic,  stable.  Followed by GI with recent imaging remaining stable.  He denies alcohol use.  Continue collaboration with GI.        Endocrine   Type 2 diabetes mellitus with obesity (HCC) - Primary   Chronic, recent A1c 13.4% and today trend down to 5.5% with diet changes, cutting out regular soda. He had been very stable since 2022, with A1c 5-6%. Will trial stopping Farxiga  since is  rarely taking and see how he does without medication. Recommend he check blood sugar daily -- currently he is not doing this.  Discussed with him risks of high sugar levels.  Urine ALB 150 January 2025, refuses ACE/ARB. - Foot exam up to date.  Needs eye exam, highly recommend he obtain as he reports reading vision is worse. - Refuses all vaccines - Zetia  on board, refuses ACE/ARB.      Relevant Medications   FARXIGA  10 MG TABS tablet   Other Relevant Orders   Bayer DCA Hb A1c Waived   Hyperlipidemia associated with type 2 diabetes mellitus (HCC)   Chronic, ongoing.  Recommend he continue Zetia  at this time, discussed at length.  Lipid panel today.      Relevant Medications   FARXIGA  10 MG TABS tablet   apixaban (ELIQUIS) 2.5 MG TABS tablet   Other Relevant Orders   Bayer DCA Hb A1c Waived   Comprehensive metabolic panel with GFR   Lipid Panel w/o Chol/HDL Ratio   Diabetes mellitus treated with oral medication (HCC)   Refer to diabetes with obesity plan of care.      Relevant Medications   FARXIGA  10 MG TABS tablet   Other Relevant Orders   Bayer DCA Hb A1c Waived     Nervous and Auditory   Hereditary spastic paraparesis (HCC)   Chronic, progressive.  Continue collaboration with [facility].  Baclofen  for discomfort as needed. Recommend referral to PT, referral in past and has not attended.          Hematopoietic and Hemostatic   Thrombocytopenia (HCC)   Chronic, stable.  ?related to past heavier alcohol intake.  CBC today.  Recommend continued cessation of alcohol use.       Relevant Orders   CBC with Differential/Platelet     Other   Generalized anxiety disorder (Chronic)   Chronic, ongoing.  Has been on benzo long term started by his previous PCP.  Discussed at length risks of long term benzo use, including risk for dementia.  He is fully aware of risks and wishes to continue, refuses psychiatry visit.  Refill sent #90 plus 2 refills.  Have HIGHLY recommend he not take more  then ordered and that he trial a reduction of it, trial BID dosing.  Will have return to office in 3 months.  UDS today and due next 01/09/24, contract up to date.      Relevant Medications   clonazePAM  (KLONOPIN ) 0.5 MG tablet (Start on 09/26/2023)   Nicotine  dependence, cigarettes, uncomplicated   I have recommended complete cessation of tobacco use. I have discussed various options available for assistance with tobacco cessation including over the counter methods (Nicotine  gum, patch and lozenges). We also discussed prescription options (Chantix, Nicotine  Inhaler / Nasal Spray). The patient is not interested in pursuing any prescription tobacco cessation options at this time.  Recommend he return to lung cancer screening, referral placed today.       Long-term current use of benzodiazepine   Refer to anxiety plan of care.  Depression, major, single episode, moderate (HCC)   Chronic, ongoing.  Denies SI/HI.   Refills sent on Klonopin , refer to anxiety plan.  Have recommended psychiatry visit, he refuses.  Has failed multiple maintenance medications.        Follow up plan: Return in about 3 months (around 12/03/2023) for T2DM, HTN/HLD, ANXIETY.

## 2023-09-02 NOTE — Assessment & Plan Note (Signed)
 Chronic, history of DVTs in the past.  Dicussed at length with him he will need to be on lifelong anticoagulant, this was recommended by hematology in the past.  Will reach out to [facility] Pharm[facility] about him not taking recently.  Will send in Eliquis 2.5 MG BID which appears to be covered, as cost was concern with Xarelto . Continue collaboration with vascular. Recommend complete cessation of smoking.

## 2023-09-02 NOTE — Assessment & Plan Note (Addendum)
 Chronic, progressive.  Continue collaboration with [facility].  Baclofen  for discomfort as needed. Recommend referral to PT, referral in past and has not attended.

## 2023-09-02 NOTE — Assessment & Plan Note (Signed)
Chronic, stable.  ?related to past heavier alcohol intake.  CBC today.  Recommend continued cessation of alcohol use.  

## 2023-09-02 NOTE — Assessment & Plan Note (Signed)
 Refer to diabetes with obesity plan of care.

## 2023-09-02 NOTE — Assessment & Plan Note (Signed)
Chronic, ongoing.  Has been on benzo long term started by his previous PCP.  Discussed at length risks of long term benzo use, including risk for dementia.  He is fully aware of risks and wishes to continue, refuses psychiatry visit.  Refill sent #90 plus 2 refills.  Have HIGHLY recommend he not take more then ordered and that he trial a reduction of it, trial BID dosing.  Will have return to office in 3 months.  UDS today and due next 01/09/24, contract up to date.

## 2023-09-02 NOTE — Assessment & Plan Note (Signed)
 Chronic, recent A1c 13.4% and today trend down to 5.5% with diet changes, cutting out regular soda. He had been very stable since 2022, with A1c 5-6%. Will trial stopping Farxiga  since is rarely taking and see how he does without medication. Recommend he check blood sugar daily -- currently he is not doing this.  Discussed with him risks of high sugar levels.  Urine ALB 150 January 2025, refuses ACE/ARB. - Foot exam up to date.  Needs eye exam, highly recommend he obtain as he reports reading vision is worse. - Refuses all vaccines - Zetia  on board, refuses ACE/ARB.

## 2023-09-02 NOTE — Assessment & Plan Note (Signed)
Chronic, stable.  Followed by GI with recent imaging remaining stable.  He denies alcohol use.  Continue collaboration with GI. 

## 2023-09-02 NOTE — Assessment & Plan Note (Signed)
 Chronic, stable without medications.  BP at goal in office today.  Recommend he monitor BP at least a few mornings a week at home and document.  DASH diet at home.  Continue current medication regimen and adjust as needed.  Urine ALB 150 January 2025, refuses ACE/ARB. Labs today: CMP and CBC.

## 2023-09-02 NOTE — Assessment & Plan Note (Signed)
 Chronic, ongoing.  Recommend he continue Zetia  at this time, discussed at length.  Lipid panel today.

## 2023-09-02 NOTE — Assessment & Plan Note (Signed)
I have recommended complete cessation of tobacco use. I have discussed various options available for assistance with tobacco cessation including over the counter methods (Nicotine gum, patch and lozenges). We also discussed prescription options (Chantix, Nicotine Inhaler / Nasal Spray). The patient is not interested in pursuing any prescription tobacco cessation options at this time.  Recommend he return to lung cancer screening, referral placed today.

## 2023-09-03 ENCOUNTER — Encounter: Payer: Self-pay | Admitting: Nurse Practitioner

## 2023-09-03 LAB — COMPREHENSIVE METABOLIC PANEL WITH GFR
ALT: 21 IU/L (ref 0–44)
AST: 19 IU/L (ref 0–40)
Albumin: 4.3 g/dL (ref 3.8–4.9)
Alkaline Phosphatase: 113 IU/L (ref 44–121)
BUN/Creatinine Ratio: 13 (ref 9–20)
BUN: 10 mg/dL (ref 6–24)
Bilirubin Total: 0.6 mg/dL (ref 0.0–1.2)
CO2: 21 mmol/L (ref 20–29)
Calcium: 9.2 mg/dL (ref 8.7–10.2)
Chloride: 97 mmol/L (ref 96–106)
Creatinine, Ser: 0.79 mg/dL (ref 0.76–1.27)
Globulin, Total: 2.5 g/dL (ref 1.5–4.5)
Glucose: 111 mg/dL — ABNORMAL HIGH (ref 70–99)
Potassium: 4.1 mmol/L (ref 3.5–5.2)
Sodium: 136 mmol/L (ref 134–144)
Total Protein: 6.8 g/dL (ref 6.0–8.5)
eGFR: 104 mL/min/{1.73_m2} (ref >59)

## 2023-09-03 LAB — CBC WITH DIFFERENTIAL/PLATELET
Basophils Absolute: 0.1 10*3/uL (ref 0.0–0.2)
Basos: 1 %
EOS (ABSOLUTE): 0.1 10*3/uL (ref 0.0–0.4)
Eos: 2 %
Hematocrit: 45.6 % (ref 37.5–51.0)
Hemoglobin: 15.4 g/dL (ref 13.0–17.7)
Immature Grans (Abs): 0 10*3/uL (ref 0.0–0.1)
Immature Granulocytes: 0 %
Lymphocytes Absolute: 1.5 10*3/uL (ref 0.7–3.1)
Lymphs: 27 %
MCH: 32.6 pg (ref 26.6–33.0)
MCHC: 33.8 g/dL (ref 31.5–35.7)
MCV: 96 fL (ref 79–97)
Monocytes Absolute: 0.5 10*3/uL (ref 0.1–0.9)
Monocytes: 9 %
Neutrophils Absolute: 3.5 10*3/uL (ref 1.4–7.0)
Neutrophils: 61 %
Platelets: 141 10*3/uL — ABNORMAL LOW (ref 150–450)
RBC: 4.73 x10E6/uL (ref 4.14–5.80)
RDW: 12.6 % (ref 11.6–15.4)
WBC: 5.8 10*3/uL (ref 3.4–10.8)

## 2023-09-03 LAB — LIPID PANEL W/O CHOL/HDL RATIO
Cholesterol, Total: 172 mg/dL (ref 100–199)
HDL: 50 mg/dL (ref >39)
LDL Chol Calc (NIH): 103 mg/dL — ABNORMAL HIGH (ref 0–99)
Triglycerides: 105 mg/dL (ref 0–149)
VLDL Cholesterol Cal: 19 mg/dL (ref 5–40)

## 2023-09-03 NOTE — Progress Notes (Signed)
 Contacted via MyChart   Good afternoon Harold Tate, your labs have returned: - Kidney function, creatinine and eGFR, remains normal, as is liver function, AST and ALT.  - Lipid panel continues to show elevation in LDL, bad cholesterol, ensure to take Zetia  daily and focus on diet/exercise. - CBC shows no infection or anemia.  Platelts are mildly low which we can continue to monitor.  Any questions? Keep being amazing!!  Thank you for allowing me to participate in your care.  I appreciate you. Kindest regards, Artha Stavros

## 2023-09-09 ENCOUNTER — Telehealth: Payer: Self-pay

## 2023-09-09 ENCOUNTER — Other Ambulatory Visit: Payer: Self-pay | Admitting: Nurse Practitioner

## 2023-09-09 NOTE — Telephone Encounter (Unsigned)
 Copied from CRM [id number]. Topic: General - Other >> Sep 09, 2023  1:22 PM Ary Bitter R wrote: Reason for CRM: Pt called and wanting to speak with the nurse to discuss his last office visit. [phone]

## 2023-09-11 MED ORDER — TADALAFIL 20 MG PO TABS: 10.0000 mg | ORAL_TABLET | Freq: Every day | ORAL | 2 refills | Status: DC | PRN

## 2023-09-11 NOTE — Telephone Encounter (Signed)
 Called and spoke to patient. Patient asked if he could have an increase on the amount of Viagra  prescribed and refills as well. Patient states that the medication is not covered at all and he is aware and ok with this.

## 2023-09-12 NOTE — Telephone Encounter (Signed)
 Called and notified patient of providers message.

## 2023-09-16 ENCOUNTER — Encounter (INDEPENDENT_AMBULATORY_CARE_PROVIDER_SITE_OTHER): Payer: Self-pay

## 2023-09-26 DIAGNOSIS — G114 Hereditary spastic paraplegia: Secondary | ICD-10-CM | POA: Diagnosis not present

## 2023-10-14 ENCOUNTER — Other Ambulatory Visit: Payer: Self-pay | Admitting: Nurse Practitioner

## 2023-10-14 ENCOUNTER — Telehealth: Payer: Self-pay

## 2023-10-14 MED ORDER — SILDENAFIL CITRATE 100 MG PO TABS: 50.0000 mg | ORAL_TABLET | Freq: Every day | ORAL | 2 refills | Status: DC | PRN

## 2023-10-14 MED ORDER — TADALAFIL 20 MG PO TABS: 10.0000 mg | ORAL_TABLET | Freq: Every day | ORAL | 2 refills | Status: DC | PRN

## 2023-10-14 NOTE — Telephone Encounter (Signed)
 Please see below. PCP to advise.     Copied from CRM [id number]. Topic: Clinical - Prescription Issue >> Oct 14, 2023 10:00 AM Chrystal Crape R wrote: Pt calling to obtain a refill for both sildenafil  (VIAGRA ) 100 MG tablet [[id number]] & tadalafil  (CIALIS ) 20 MG tablet [[id number]] Sidenafil has been discontinued may of 2025 by the dr, however the pt stated he never wanted it to be discontinued he was the prescriptions for both medication. Pharmacy informed him the Cialis  is a med he can take on the weekend and the Viagra  can be used nightly.

## 2023-10-14 NOTE — Telephone Encounter (Signed)
 Spoke with patient and he is aware this is resolved.

## 2023-10-23 ENCOUNTER — Ambulatory Visit: Payer: Self-pay

## 2023-10-23 ENCOUNTER — Ambulatory Visit
Admission: EM | Admit: 2023-10-23 | Discharge: 2023-10-23 | Disposition: A | Discharge status: Home / Self Care | Attending: Emergency Medicine | Admitting: Emergency Medicine

## 2023-10-23 DIAGNOSIS — S90424A Blister (nonthermal), right lesser toe(s), initial encounter: Secondary | ICD-10-CM

## 2023-10-23 MED ORDER — MUPIROCIN 2 % EX OINT: 1.0000 | TOPICAL_OINTMENT | Freq: Two times a day (BID) | CUTANEOUS | 0 refills | Status: AC

## 2023-10-23 NOTE — Telephone Encounter (Signed)
 FYI Only or Action Required?: Action required by provider: request for appointment.  Patient was last seen in primary care on 09/02/2023 by Valerio Melanie DASEN, NP. Called Nurse Triage reporting Blister. Symptoms began yesterday. Interventions attempted: OTC medications: Neosporin  and Rest, hydration, or home remedies. Symptoms are: stable.  Triage Disposition: See Physician Within 24 Hours  Patient/caregiver understands and will follow disposition?: Yes                             Copied from CRM [id number]. Topic: Clinical - Red Word Triage >> Oct 23, 2023 11:47 AM Sasha H wrote: Red Word that prompted transfer to Nurse Triage: Pt has blister size of a dime on his big toe, states it is gray. Doesn't hurt but wants to make sure it wasn't a brown recluse bite Reason for Disposition  [1] Blister on foot AND [2] diabetes mellitus or peripheral vascular disease (poor circulation)  Answer Assessment - Initial Assessment Questions 1. APPEARANCE of BLISTER: What does it look like?     Red, weirdly shaped 2. SIZE: How large is the blister? (inches, cm or compare to coins)     Close to the size of a quarter  3. LOCATION: Where are the blisters located?      Right big toe 4. WHEN: When did the blister happen?     States he noticed it last night when he took his shoes off  5. CAUSE: What do you think caused the blister?     Unsure, concerned about a brown recluse spider bite, does not recall being bitten 6. PAIN: Does it hurt? If Yes, ask: How bad is the pain?  (Scale 1-10; or mild, moderate, severe)     Mild pain 7. OTHER SYMPTOMS: Do you have any other symptoms? (e.g., fever)     Pus/slime-like drainage, denies fever, denies rash, denies headache    Patient would like an appointment in the office today or tomorrow and does not want to go to UC at this time. No availability in office until Monday.  Protocols used: Blister - Foot and Hand-A-AH

## 2023-10-23 NOTE — ED Provider Notes (Signed)
 [facility]    CSN: [id number] Arrival date & time: 10/23/23  1604      History   Chief Complaint Chief Complaint  Patient presents with   Nail Problem    HPI Harold Tate is a 58 y.o. male.   58 year old male pt, Harold Tate, presents to urgent care for evaluation of right toe wound.  Patient states he had a blister that he touched and skin came off, unknown cause. Patient has tried peroxide, Neosporin , and alcohol without relief of symptoms. Pt denies new shoes or known insect bite.  The history is provided by the patient. No language interpreter was used.    Past Medical History:  Diagnosis Date   Anxiety    Arthritis    Diabetes mellitus without complication (HCC)    Patient states A1C has gone down, no longer a diabetic   Dyspnea    Hip pain    Hypertension    Raynaud's syndrome    Sleep apnea     CPAP/ Doesn't use    Patient Active Problem List   Diagnosis Date Noted   Toe blister without infection, right, initial encounter 10/23/2023   Diabetes mellitus treated with oral medication (HCC) 08/24/2023   Carotid artery disease (HCC) 07/01/2023   Pain in limb 07/01/2023   History of cerebral infarction 06/03/2023   Chronic deep vein thrombosis (DVT) of popliteal vein of left lower extremity (HCC) 02/09/2023   Right shoulder pain 10/09/2022   Controlled substance agreement signed 07/09/2022   Cirrhosis of liver (HCC) 03/07/2021   Aortic atherosclerosis (HCC) 12/28/2020   Polyp of ascending colon    Family history of polyps in the colon 08/24/2020   Thrombocytopenia (HCC) 05/31/2020   B12 deficiency 04/24/2020   Hereditary spastic paraparesis (HCC) 04/22/2020   Long-term current use of benzodiazepine 06/10/2019   Status post total hip replacement, left 02/11/2019   Depression, major, single episode, moderate (HCC) 01/11/2019   Status post total hip replacement, right 11/03/2018   Osteoarthritis of both hips 10/01/2018   Type 2 diabetes  mellitus with obesity (HCC) 11/05/2016   Hyperlipidemia associated with type 2 diabetes mellitus (HCC) 06/06/2016   Raynaud's syndrome 09/30/2014   Generalized anxiety disorder 09/30/2014   Nicotine  dependence, cigarettes, uncomplicated 09/30/2014   Hypertension associated with diabetes (HCC) 09/30/2014    Past Surgical History:  Procedure Laterality Date   COLONOSCOPY WITH PROPOFOL  N/A 12/08/2020   Procedure: COLONOSCOPY WITH PROPOFOL ;  Surgeon: Jinny Carmine, MD;  Location: [facility];  Service: Endoscopy;  Laterality: N/A;   ESOPHAGOGASTRODUODENOSCOPY (EGD) WITH PROPOFOL  N/A 12/08/2020   Procedure: ESOPHAGOGASTRODUODENOSCOPY (EGD) WITH PROPOFOL ;  Surgeon: Jinny Carmine, MD;  Location: [facility];  Service: Endoscopy;  Laterality: N/A;   I & D EXTREMITY     OPEN REDUCTION INTERNAL FIXATION (ORIF) TIBIA/FIBULA FRACTURE Left July 2014   POLYPECTOMY N/A 12/08/2020   Procedure: POLYPECTOMY;  Surgeon: Jinny Carmine, MD;  Location: [facility];  Service: Endoscopy;  Laterality: N/A;   TOTAL HIP ARTHROPLASTY Right 11/03/2018   Procedure: RIGHT TOTAL HIP ARTHROPLASTY ANTERIOR APPROACH;  Surgeon: Kathlynn Sharper, MD;  Location: [facility];  Service: Orthopedics;  Laterality: Right;   TOTAL HIP ARTHROPLASTY Left 02/11/2019   Procedure: TOTAL HIP ARTHROPLASTY ANTERIOR APPROACH;  Surgeon: Kathlynn Sharper, MD;  Location: [facility];  Service: Orthopedics;  Laterality: Left;       Home Medications    Prior to Admission medications   Medication Sig Start Date End Date Taking? Authorizing Provider  apixaban  (ELIQUIS ) 2.5 MG TABS tablet Take 1 tablet (2.5 mg total) by mouth 2 (two) times daily. 09/02/23  Yes Cannady, Jolene T, NP  baclofen  (LIORESAL ) 10 MG tablet Take 10 mg by mouth 3 (three) times daily. 11/21/21  Yes [provider]  clonazePAM  (KLONOPIN ) 0.5 MG tablet Take 1 tablet (0.5 mg total) by mouth 3 (three) times daily as needed for anxiety. 09/26/23  Yes Cannady, Jolene  T, NP  ezetimibe  (ZETIA ) 10 MG tablet Take 1 tablet (10 mg total) by mouth daily. 04/25/23  Yes Cannady, Jolene T, NP  FARXIGA  10 MG TABS tablet Take 10 mg by mouth daily. 08/29/23  Yes [provider]  mupirocin ointment (BACTROBAN) 2 % Apply 1 Application topically 2 (two) times daily for 7 days. Right toe 10/23/23 10/30/23 Yes Jyrah Blye, Rilla, NP  sildenafil  (VIAGRA ) 100 MG tablet Take 0.5-1 tablets (50-100 mg total) by mouth daily as needed for erectile dysfunction. Do not take on same day you take Cialis . 10/14/23  Yes Cannady, Jolene T, NP  tadalafil  (CIALIS ) 20 MG tablet Take 0.5 tablets (10 mg total) by mouth daily as needed for erectile dysfunction. Do not take on same day you take Viagra . 10/14/23  Yes Cannady, Jolene T, NP    Family History Family History  Problem Relation Age of Onset   Depression Sister     Social History Social History   Tobacco Use   Smoking status: Every Day    Current packs/day: 1.00    Average packs/day: 1 pack/day for 27.0 years (27.0 ttl pk-yrs)    Types: Cigarettes   Smokeless tobacco: Never  Vaping Use   Vaping status: Never Used  Substance Use Topics   Alcohol use: Not Currently   Drug use: Not Currently    Types: Benzodiazepines     Allergies   Metformin  and related and Chlorhexidine    Review of Systems Review of Systems  Constitutional:  Negative for fever.  Skin:  Positive for color change and wound.  All other systems reviewed and are negative.    Physical Exam Triage Vital Signs ED Triage Vitals  Encounter Vitals Group     BP 10/23/23 1621 116/75     Girls Systolic BP Percentile --      Girls Diastolic BP Percentile --      Boys Systolic BP Percentile --      Boys Diastolic BP Percentile --      Pulse Rate 10/23/23 1621 78     Resp --      Temp 10/23/23 1621 98.3 F (36.8 C)     Temp Source 10/23/23 1621 Oral     SpO2 10/23/23 1621 96 %     Weight 10/23/23 1622 194 lb (88 kg)     Height 10/23/23 1622 6' 1  (1.854 m)     Head Circumference --      Peak Flow --      Pain Score 10/23/23 1622 0     Pain Loc --      Pain Education --      Exclude from Growth Chart --    No data found.  Updated Vital Signs BP 116/75 (BP Location: Left Arm)   Pulse 78   Temp 98.3 F (36.8 C) (Oral)   Ht 6' 1 (1.854 m)   Wt 194 lb (88 kg)   SpO2 96%   BMI 25.60 kg/m   Visual Acuity Right Eye Distance:   Left Eye Distance:   Bilateral Distance:  Right Eye Near:   Left Eye Near:    Bilateral Near:     Physical Exam Vitals and nursing note reviewed.  Constitutional:      Appearance: He is well-developed and well-groomed.   Cardiovascular:     Rate and Rhythm: Normal rate.     Pulses:          Dorsalis pedis pulses are 2+ on the right side.  Pulmonary:     Effort: Pulmonary effort is normal.   Musculoskeletal:       Feet:   Skin:    General: Skin is warm.     Capillary Refill: Capillary refill takes less than 2 seconds.     Findings: Wound present.     Comments: Right great toe   Neurological:     General: No focal deficit present.     Mental Status: He is alert and oriented to person, place, and time.     GCS: GCS eye subscore is 4. GCS verbal subscore is 5. GCS motor subscore is 6.   Psychiatric:        Attention and Perception: Attention normal.        Mood and Affect: Mood normal.        Speech: Speech normal.        Behavior: Behavior is cooperative.      UC Treatments / Results  Labs (all labs ordered are listed, but only abnormal results are displayed) Labs Reviewed - No data to display  EKG   Radiology No results found.  Procedures Procedures (including critical care time)  Medications Ordered in UC Medications - No data to display  Initial Impression / Assessment and Plan / UC Course  I have reviewed the triage vital signs and the nursing notes.  Pertinent labs & imaging results that were available during my care of the patient were reviewed by me  and considered in my medical decision making (see chart for details).    Discussed exam findings and plan of care with patient, mupirocin ointment scripted , strict go to ER precautions given.   Patient verbalized understanding to this provider.  Ddx: Right great toe unroofed blister, athlete's foot, insect bite Final Clinical Impressions(s) / UC Diagnoses   Final diagnoses:  Toe blister without infection, right, initial encounter     Discharge Instructions      Apply mupirocin ointment to affected area, cover with nonstick bandage or Telfa cover , follow-up with your PCP in 4 days for wound check sooner if worse.  Go to ER for new or worsening issues or concerns(fever, increased redness, streaking, etc.)    ED Prescriptions     Medication Sig Dispense Auth. Provider   mupirocin ointment (BACTROBAN) 2 % Apply 1 Application topically 2 (two) times daily for 7 days. Right toe 15 g Endia Moncur, Rilla, NP      PDMP not reviewed this encounter.   Aminta Rilla, NP 10/23/23 2045

## 2023-10-23 NOTE — Discharge Instructions (Addendum)
 Apply mupirocin ointment to affected area, cover with nonstick bandage or Telfa cover , follow-up with your PCP in 4 days for wound check sooner if worse.  Go to ER for new or worsening issues or concerns(fever, increased redness, streaking, etc.)

## 2023-10-23 NOTE — ED Triage Notes (Signed)
 Pt has a nickel sized wound between the 1st and 2nd toes on the right foot.   Pt states that he pulled off the skin of the blister and denies any pain.

## 2023-10-23 NOTE — Telephone Encounter (Signed)
Please call and schedule the patient an appointment.  

## 2023-10-23 NOTE — ED Triage Notes (Signed)
 Pt c/o blister on right 1st toe x1day  Pt states that he noticed the blister last night after removing his shoes.  Pt states that the blister popped and he used alcohol and peroxide to clean the wound.

## 2023-10-24 NOTE — Telephone Encounter (Signed)
 Called to schedule with provider unable to leave message voice mail full.

## 2023-11-12 ENCOUNTER — Other Ambulatory Visit: Payer: Self-pay | Admitting: Nurse Practitioner

## 2023-11-12 DIAGNOSIS — F411 Generalized anxiety disorder: Secondary | ICD-10-CM

## 2023-11-14 NOTE — Telephone Encounter (Signed)
 Requested medication (s) are due for refill today: yes  Requested medication (s) are on the active medication list: yes  Last refill:  09/02/23  Future visit scheduled: no  Notes to clinic:  Unable to refill per protocol, cannot delegate.      Requested Prescriptions  Pending Prescriptions Disp Refills   clonazePAM  (KLONOPIN ) 0.5 MG tablet [Pharmacy Med Name: CLONAZEPAM  0.5 MG TABLET] 90 tablet 0    Sig: Take 1 tablet (0.5 mg total) by mouth 3 (three) times daily as needed for anxiety.     Not Delegated - Psychiatry: Anxiolytics/Hypnotics 2 Failed - 11/14/2023 10:28 AM      Failed - This refill cannot be delegated      Passed - Urine Drug Screen completed in last 360 days      Passed - Patient is not pregnant      Passed - Valid encounter within last 6 months    Recent Outpatient Visits           2 months ago Type 2 diabetes mellitus with obesity (HCC)   [place] [facility] [place], [place] T, NP   5 months ago Type 2 diabetes mellitus with obesity (HCC)   [place] [facility] [place], Melanie DASEN, NP

## 2023-11-30 NOTE — Patient Instructions (Signed)

## 2023-12-05 ENCOUNTER — Encounter: Payer: Self-pay | Admitting: Nurse Practitioner

## 2023-12-05 ENCOUNTER — Ambulatory Visit: Admitting: Nurse Practitioner

## 2023-12-05 VITALS — BP 134/64 | HR 66 | Temp 97.8°F | Ht 73.0 in | Wt 186.8 lb

## 2023-12-05 DIAGNOSIS — K7469 Other cirrhosis of liver: Secondary | ICD-10-CM

## 2023-12-05 DIAGNOSIS — Z79899 Other long term (current) drug therapy: Secondary | ICD-10-CM | POA: Diagnosis not present

## 2023-12-05 DIAGNOSIS — D696 Thrombocytopenia, unspecified: Secondary | ICD-10-CM

## 2023-12-05 DIAGNOSIS — I82532 Chronic embolism and thrombosis of left popliteal vein: Secondary | ICD-10-CM | POA: Diagnosis not present

## 2023-12-05 DIAGNOSIS — I779 Disorder of arteries and arterioles, unspecified: Secondary | ICD-10-CM

## 2023-12-05 DIAGNOSIS — F411 Generalized anxiety disorder: Secondary | ICD-10-CM | POA: Diagnosis not present

## 2023-12-05 DIAGNOSIS — I152 Hypertension secondary to endocrine disorders: Secondary | ICD-10-CM

## 2023-12-05 DIAGNOSIS — E1159 Type 2 diabetes mellitus with other circulatory complications: Secondary | ICD-10-CM

## 2023-12-05 DIAGNOSIS — E669 Obesity, unspecified: Secondary | ICD-10-CM | POA: Diagnosis not present

## 2023-12-05 DIAGNOSIS — E785 Hyperlipidemia, unspecified: Secondary | ICD-10-CM | POA: Diagnosis not present

## 2023-12-05 DIAGNOSIS — E119 Type 2 diabetes mellitus without complications: Secondary | ICD-10-CM

## 2023-12-05 DIAGNOSIS — E1169 Type 2 diabetes mellitus with other specified complication: Secondary | ICD-10-CM | POA: Diagnosis not present

## 2023-12-05 DIAGNOSIS — F321 Major depressive disorder, single episode, moderate: Secondary | ICD-10-CM

## 2023-12-05 DIAGNOSIS — G114 Hereditary spastic paraplegia: Secondary | ICD-10-CM

## 2023-12-05 DIAGNOSIS — Z7984 Long term (current) use of oral hypoglycemic drugs: Secondary | ICD-10-CM | POA: Diagnosis not present

## 2023-12-05 DIAGNOSIS — F17210 Nicotine dependence, cigarettes, uncomplicated: Secondary | ICD-10-CM

## 2023-12-05 LAB — BAYER DCA HB A1C WAIVED: HB A1C (BAYER DCA - WAIVED): 5.6 % (ref 4.8–5.6)

## 2023-12-05 MED ORDER — CLONAZEPAM 0.5 MG PO TABS
0.5000 mg | ORAL_TABLET | Freq: Three times a day (TID) | ORAL | 2 refills | Status: DC | PRN
Start: 2023-12-05 — End: 2024-03-10

## 2023-12-05 NOTE — Assessment & Plan Note (Signed)
 Chronic, stable.  Followed by GI with recent imaging remaining stable.  He denies alcohol use.  Continue collaboration with GI. Ordered repeat imaging as he was supposed to have this some months back and never attended.

## 2023-12-05 NOTE — Assessment & Plan Note (Signed)
 Follows with vascular, continue this collaboration and ASA + Zetia .  He is thinking about trying a statin again. Recent imaging: showed velocities in the 1 to 39% range on the right and just into the 40 to 59% range on the left.

## 2023-12-05 NOTE — Progress Notes (Signed)
 BP 134/64   Pulse 66   Temp 97.8 F (36.6 C) (Oral)   Ht 6' 1 (1.854 m)   Wt 186 lb 12.8 oz (84.7 kg)   SpO2 99%   BMI 24.65 kg/m    Subjective:    Patient ID: Harold Tate, male    DOB: 1965/08/17, 58 y.o.   MRN: [id number]  HPI: Harold Tate is a 58 y.o. male  Chief Complaint  Patient presents with   Depression   Diabetes   Hyperlipidemia   Hypertension   DIABETES Last A1c May 5.5%. Had been off medications >1 year with A1c in 5-6 range, but had poor diet changes and this led to BS elevations months back. He has now been off Farxiga  again for over 3 months. Has cut out regular sodas and is back to working on diet and exercise. Hypoglycemic episodes:no Polydipsia/polyuria: no Visual disturbance: no Chest pain: no Paresthesias: no Glucose Monitoring: no  Accucheck frequency: Not Checking  Fasting glucose:  Post prandial:  Evening:  Before meals: Taking Insulin?: no  Long acting insulin:  Short acting insulin: Blood Pressure Monitoring: not checking Retinal Examination: Not up to Date - missed appointment, is taking care of his mom who has dementia Foot Exam: Up to Date Pneumovax: Not up to Date -- refuses Influenza: Not up to Date -- refuses Aspirin: no   HYPERTENSION / HYPERLIPIDEMIA/ CIRRHOSIS + DVT Saw vascular on 07/15/23 with imaging stable, he is only taking ASA at this time. They did report he can take this, but could consider low dose of Xarelto  for prophylaxis.  He has trouble affording prescription anticoagulants and reports he told vascular he would prefer to take ASA.  Aortic atherosclerosis on imaging 12/27/20.    Follows with [facility] neurology for hereditary spastic paraparesis neurology on 09/26/23. To take Baclofen  TID PRN and they ordered OT/PT, which he reports he has not performed.  Followed with GI, last visit 07/24/21, ultrasound on 09/16/22 was stable. To have repeat ultrasound in 6 months, but missed this and has not returned to GI. Denies  heavy alcohol use, no current use of any alcohol. Continues to smoke, smokes about 1 PPD. Smoked since he was 58 years old.   Satisfied with current treatment? yes Duration of hyperlipidemia: chronic Cholesterol medication side effects: no Cholesterol supplements: none Medication compliance: good compliance Aspirin: no Recent stressors: no Recurrent headaches: no Visual changes: no Palpitations: no Dyspnea: no Chest pain: no Lower extremity edema: no Dizzy/lightheaded: no   ANXIETY/STRESS Takes Clonazepam  0.5 MG TID ordered by previous PCP, Dr. Montell. Been on this for several years. He reports he is easing off of this because his girlfriend is encouraging him to.Danita Xanax in past but was changed as anxiety became worse with this. Pt is aware of risks of benzo medication use to include increased sedation, respiratory suppression, falls, extrapyramidal movements, dependence and cardiovascular events. Pt would like to continue treatment as benefit determined to outweigh risk.    In past tried Wellbutrin, Celexa , and Prozac  in past without success. Wellbutrin caused weird thoughts. PDMP last Klonopin  fill was 11/12/23. No other recent controlled substance fills.   Duration:stable Anxious mood: yes  Excessive worrying: no Irritability: no  Sweating: no Nausea: no Palpitations:no Hyperventilation: no Panic attacks: no Agoraphobia: no  Obscessions/compulsions: no Depressed mood: no    12/05/2023    1:31 PM 09/02/2023    1:25 PM 05/21/2023    3:04 PM 02/10/2023   10:27 AM 01/09/2023  9:08 AM  Depression screen PHQ 2/9  Decreased Interest 0 0 1 0 0  Down, Depressed, Hopeless 0 0 0 0 0  PHQ - 2 Score 0 0 1 0 0  Altered sleeping 0 0 0 0 0  Tired, decreased energy 0 0 3 0 0  Change in appetite 0 0 0 3 0  Feeling bad or failure about yourself  0 0 1 0 0  Trouble concentrating 0 0 0 0 0  Moving slowly or fidgety/restless 0 0 0 0 0  Suicidal thoughts 0 0 0 0 0  PHQ-9 Score 0 0 5 3  0  Difficult doing work/chores Not difficult at all Not difficult at all Somewhat difficult  Not difficult at all  Anhedonia: no Weight changes: no Insomnia: none Hypersomnia: no Fatigue/loss of energy: no Feelings of worthlessness: no Feelings of guilt: no Impaired concentration/indecisiveness: no Suicidal ideations: no  Crying spells: no Recent Stressors/Life Changes: no   Relationship problems: no   Family stress: no     Financial stress: no    Job stress: no    Recent death/loss: no     December 13, 2023    1:31 PM 09/02/2023    1:25 PM 05/21/2023    3:05 PM 02/10/2023   10:28 AM  GAD 7 : Generalized Anxiety Score  Nervous, Anxious, on Edge 0 1 3 3   Control/stop worrying 0 0 1 0  Worry too much - different things 0 0 3 0  Trouble relaxing 0 0 0 0  Restless 0 0 0 0  Easily annoyed or irritable 1 0 0 0  Afraid - awful might happen 0 0 1 0  Total GAD 7 Score 1 1 8 3   Anxiety Difficulty Somewhat difficult Not difficult at all Somewhat difficult    Relevant past medical, surgical, family and social history reviewed and updated as indicated. Interim medical history since our last visit reviewed. Allergies and medications reviewed and updated.  Review of Systems  Constitutional:  Negative for activity change, diaphoresis, fatigue and fever.  Respiratory:  Negative for cough, chest tightness, shortness of breath and wheezing.   Cardiovascular:  Negative for chest pain, palpitations and leg swelling.  Gastrointestinal: Negative.   Endocrine: Negative for cold intolerance, heat intolerance, polydipsia, polyphagia and polyuria.  Neurological: Negative.   Psychiatric/Behavioral: Negative.     Per HPI unless specifically indicated above     Objective:    BP 134/64   Pulse 66   Temp 97.8 F (36.6 C) (Oral)   Ht 6' 1 (1.854 m)   Wt 186 lb 12.8 oz (84.7 kg)   SpO2 99%   BMI 24.65 kg/m   Wt Readings from Last 3 Encounters:  2023-12-13 186 lb 12.8 oz (84.7 kg)  10/23/23 194 lb (88  kg)  09/02/23 186 lb 6.4 oz (84.6 kg)    Physical Exam Vitals and nursing note reviewed.  Constitutional:      General: He is awake. He is not in acute distress.    Appearance: He is well-developed and well-groomed. He is not ill-appearing or toxic-appearing.  HENT:     Head: Normocephalic.     Right Ear: Hearing and external ear normal.     Left Ear: Hearing and external ear normal.  Eyes:     General: Lids are normal.     Extraocular Movements: Extraocular movements intact.     Conjunctiva/sclera: Conjunctivae normal.  Neck:     Thyroid: No thyromegaly.     Vascular: No carotid  bruit.  Cardiovascular:     Rate and Rhythm: Normal rate and regular rhythm.     Heart sounds: Normal heart sounds. No murmur heard.    No gallop.  Pulmonary:     Effort: No accessory muscle usage or respiratory distress.     Breath sounds: Normal breath sounds.  Abdominal:     General: Bowel sounds are normal. There is no distension.     Palpations: Abdomen is soft.     Tenderness: There is no abdominal tenderness.  Musculoskeletal:     Cervical back: Full passive range of motion without pain.     Right lower leg: No edema.     Left lower leg: No edema.  Lymphadenopathy:     Cervical: No cervical adenopathy.  Skin:    General: Skin is warm.     Capillary Refill: Capillary refill takes less than 2 seconds.  Neurological:     Mental Status: He is alert and oriented to person, place, and time.     Deep Tendon Reflexes: Reflexes are normal and symmetric.     Reflex Scores:      Brachioradialis reflexes are 2+ on the right side and 2+ on the left side.      Patellar reflexes are 2+ on the right side and 2+ on the left side. Psychiatric:        Attention and Perception: Attention normal.        Mood and Affect: Mood normal.        Speech: Speech normal.        Behavior: Behavior normal. Behavior is cooperative.        Thought Content: Thought content normal.    Results for orders placed or  performed in visit on 12/05/23  Bayer DCA Hb A1c Waived   Collection Time: 12/05/23  1:32 PM  Result Value Ref Range   HB A1C (BAYER DCA - WAIVED) 5.6 4.8 - 5.6 %      Assessment & Plan:   Problem List Items Addressed This Visit       Cardiovascular and Mediastinum   Hypertension associated with diabetes (HCC)   Chronic, stable without medications.  BP at goal in office today.  Recommend he monitor BP at least a few mornings a week at home and document.  DASH diet at home.  Continue current medication regimen and adjust as needed.  Urine ALB 150 January 2025, refuses ACE/ARB. Labs today: CMP and CBC.      Relevant Orders   Bayer DCA Hb A1c Waived (Completed)   Chronic deep vein thrombosis (DVT) of popliteal vein of left lower extremity (HCC)   Chronic, history of DVTs in the past.  Dicussed at length with him he may benefit from lifelong anticoagulant, this was recommended by hematology in the past.  Recent visit with vascular, okay to take ASA, but in future would benefit Xarelto  when cost effective. He has spoken to [facility] in past and not able to get assistance.  Continue collaboration with vascular. Recommend complete cessation of smoking.      Relevant Orders   CBC with Differential/Platelet   Comprehensive metabolic panel with GFR   Carotid artery disease (HCC)   Follows with vascular, continue this collaboration and ASA + Zetia .  He is thinking about trying a statin again. Recent imaging: showed velocities in the 1 to 39% range on the right and just into the 40 to 59% range on the left.  Digestive   Cirrhosis of liver (HCC)   Chronic, stable.  Followed by GI with recent imaging remaining stable.  He denies alcohol use.  Continue collaboration with GI. Ordered repeat imaging as he was supposed to have this some months back and never attended.      Relevant Orders   CBC with Differential/Platelet   Comprehensive metabolic panel with GFR   US  Abdomen Limited  RUQ (LIVER/GB)     Endocrine   Type 2 diabetes mellitus with obesity (HCC) - Primary   Chronic, stable with A1c remaining 5.6% today. He had been very stable since 2022, then had recent upward trend which he has fixed with diet and exercise. Currently diet controlled.  Recommend he check blood sugar daily -- currently he is not doing this.  Discussed with him risks of high sugar levels.  Urine ALB 150 January 2025, refuses ACE/ARB. - Foot exam up to date.  Needs eye exam, highly recommend he obtain as he reports reading vision is worse. - Refuses all vaccines - Zetia  on board, refuses ACE/ARB.      Relevant Orders   Bayer DCA Hb A1c Waived (Completed)   Hyperlipidemia associated with type 2 diabetes mellitus (HCC)   Chronic, ongoing.  Recommend he continue Zetia  at this time, discussed at length.  Lipid panel today.      Relevant Orders   Bayer DCA Hb A1c Waived (Completed)   Comprehensive metabolic panel with GFR   Lipid Panel w/o Chol/HDL Ratio     Nervous and Auditory   Hereditary spastic paraparesis (HCC)   Chronic, progressive.  Continue collaboration with [facility].  Baclofen  for discomfort as needed. Recommend referral to PT, referral in past and has not attended.          Hematopoietic and Hemostatic   Thrombocytopenia (HCC)   Chronic, stable.  ?related to past heavier alcohol intake.  CBC today.  Recommend continued cessation of alcohol use.       Relevant Orders   CBC with Differential/Platelet     Other   Generalized anxiety disorder (Chronic)   Chronic, ongoing.  Has been on benzo long term started by his previous PCP.  Discussed at length risks of long term benzo use, including risk for dementia.  He is fully aware of risks and wishes to continue, refuses psychiatry visit.  Refill sent #90 plus 2 refills.  Have HIGHLY recommended he not take more then ordered and that he trial a reduction of it, trial BID dosing.  Will have return to office in 3 months.  UDS due next on  12/04/24, contract up to date.      Relevant Medications   clonazePAM  (KLONOPIN ) 0.5 MG tablet   Other Relevant Orders   [id number] 11+Oxyco+Alc+Crt-Bund   Nicotine  dependence, cigarettes, uncomplicated   I have recommended complete cessation of tobacco use. I have discussed various options available for assistance with tobacco cessation including over the counter methods (Nicotine  gum, patch and lozenges). We also discussed prescription options (Chantix, Nicotine  Inhaler / Nasal Spray). The patient is not interested in pursuing any prescription tobacco cessation options at this time.  Recommend he return to lung cancer screening which he has not.       Long-term current use of benzodiazepine   Refer to anxiety plan of care.      Relevant Orders   [id number] 11+Oxyco+Alc+Crt-Bund   Depression, major, single episode, moderate (HCC)   Chronic, ongoing.  Denies SI/HI.   Refills sent on Klonopin , refer to anxiety  plan.  Have recommended psychiatry visit, he refuses.  Has failed multiple maintenance medications.      Relevant Orders   [id number] 11+Oxyco+Alc+Crt-Bund     Follow up plan: Return in about 3 months (around 03/06/2024) for Anxiety with refills.

## 2023-12-05 NOTE — Assessment & Plan Note (Addendum)
 I have recommended complete cessation of tobacco use. I have discussed various options available for assistance with tobacco cessation including over the counter methods (Nicotine  gum, patch and lozenges). We also discussed prescription options (Chantix, Nicotine  Inhaler / Nasal Spray). The patient is not interested in pursuing any prescription tobacco cessation options at this time.  Recommend he return to lung cancer screening which he has not.

## 2023-12-05 NOTE — Assessment & Plan Note (Signed)
 Chronic, ongoing.  Has been on benzo long term started by his previous PCP.  Discussed at length risks of long term benzo use, including risk for dementia.  He is fully aware of risks and wishes to continue, refuses psychiatry visit.  Refill sent #90 plus 2 refills.  Have HIGHLY recommended he not take more then ordered and that he trial a reduction of it, trial BID dosing.  Will have return to office in 3 months.  UDS due next on 12/04/24, contract up to date.

## 2023-12-05 NOTE — Assessment & Plan Note (Signed)
 Refer to anxiety plan of care.

## 2023-12-05 NOTE — Assessment & Plan Note (Signed)
 Chronic, stable with A1c remaining 5.6% today. He had been very stable since 2022, then had recent upward trend which he has fixed with diet and exercise. Currently diet controlled.  Recommend he check blood sugar daily -- currently he is not doing this.  Discussed with him risks of high sugar levels.  Urine ALB 150 January 2025, refuses ACE/ARB. - Foot exam up to date.  Needs eye exam, highly recommend he obtain as he reports reading vision is worse. - Refuses all vaccines - Zetia  on board, refuses ACE/ARB.

## 2023-12-05 NOTE — Assessment & Plan Note (Signed)
 Chronic, stable without medications.  BP at goal in office today.  Recommend he monitor BP at least a few mornings a week at home and document.  DASH diet at home.  Continue current medication regimen and adjust as needed.  Urine ALB 150 January 2025, refuses ACE/ARB. Labs today: CMP and CBC.

## 2023-12-05 NOTE — Assessment & Plan Note (Signed)
 Chronic, history of DVTs in the past.  Dicussed at length with him he may benefit from lifelong anticoagulant, this was recommended by hematology in the past.  Recent visit with vascular, okay to take ASA, but in future would benefit Xarelto  when cost effective. He has spoken to [facility] in past and not able to get assistance.  Continue collaboration with vascular. Recommend complete cessation of smoking.

## 2023-12-05 NOTE — Assessment & Plan Note (Signed)
Chronic, ongoing.  Denies SI/HI.   Refills sent on Klonopin, refer to anxiety plan.  Have recommended psychiatry visit, he refuses.  Has failed multiple maintenance medications. 

## 2023-12-05 NOTE — Assessment & Plan Note (Signed)
 Chronic, ongoing.  Recommend he continue Zetia  at this time, discussed at length.  Lipid panel today.

## 2023-12-05 NOTE — Assessment & Plan Note (Signed)
 Chronic, progressive.  Continue collaboration with [facility].  Baclofen  for discomfort as needed. Recommend referral to PT, referral in past and has not attended.

## 2023-12-05 NOTE — Assessment & Plan Note (Signed)
Chronic, stable.  ?related to past heavier alcohol intake.  CBC today.  Recommend continued cessation of alcohol use.  

## 2023-12-06 ENCOUNTER — Ambulatory Visit: Payer: Self-pay | Admitting: Nurse Practitioner

## 2023-12-06 LAB — COMPREHENSIVE METABOLIC PANEL WITH GFR
ALT: 22 IU/L (ref 0–44)
AST: 22 IU/L (ref 0–40)
Albumin: 4.5 g/dL (ref 3.8–4.9)
Alkaline Phosphatase: 133 IU/L — ABNORMAL HIGH (ref 44–121)
BUN/Creatinine Ratio: 16 (ref 9–20)
BUN: 15 mg/dL (ref 6–24)
Bilirubin Total: 0.5 mg/dL (ref 0.0–1.2)
CO2: 21 mmol/L (ref 20–29)
Calcium: 9.7 mg/dL (ref 8.7–10.2)
Chloride: 99 mmol/L (ref 96–106)
Creatinine, Ser: 0.91 mg/dL (ref 0.76–1.27)
Globulin, Total: 2.7 g/dL (ref 1.5–4.5)
Glucose: 105 mg/dL — ABNORMAL HIGH (ref 70–99)
Potassium: 4.4 mmol/L (ref 3.5–5.2)
Sodium: 139 mmol/L (ref 134–144)
Total Protein: 7.2 g/dL (ref 6.0–8.5)
eGFR: 98 mL/min/1.73 (ref >59)

## 2023-12-06 LAB — CBC WITH DIFFERENTIAL/PLATELET
Basophils Absolute: 0.1 x10E3/uL (ref 0.0–0.2)
Basos: 2 %
EOS (ABSOLUTE): 0.5 x10E3/uL — ABNORMAL HIGH (ref 0.0–0.4)
Eos: 7 %
Hematocrit: 46.2 % (ref 37.5–51.0)
Hemoglobin: 15.2 g/dL (ref 13.0–17.7)
Immature Grans (Abs): 0 x10E3/uL (ref 0.0–0.1)
Immature Granulocytes: 0 %
Lymphocytes Absolute: 1.8 x10E3/uL (ref 0.7–3.1)
Lymphs: 29 %
MCH: 33.1 pg — ABNORMAL HIGH (ref 26.6–33.0)
MCHC: 32.9 g/dL (ref 31.5–35.7)
MCV: 101 fL — ABNORMAL HIGH (ref 79–97)
Monocytes Absolute: 0.5 x10E3/uL (ref 0.1–0.9)
Monocytes: 8 %
Neutrophils Absolute: 3.3 x10E3/uL (ref 1.4–7.0)
Neutrophils: 54 %
Platelets: 155 x10E3/uL (ref 150–450)
RBC: 4.59 x10E6/uL (ref 4.14–5.80)
RDW: 13.5 % (ref 11.6–15.4)
WBC: 6.2 x10E3/uL (ref 3.4–10.8)

## 2023-12-06 LAB — LIPID PANEL W/O CHOL/HDL RATIO
Cholesterol, Total: 184 mg/dL (ref 100–199)
HDL: 59 mg/dL (ref >39)
LDL Chol Calc (NIH): 112 mg/dL — ABNORMAL HIGH (ref 0–99)
Triglycerides: 67 mg/dL (ref 0–149)
VLDL Cholesterol Cal: 13 mg/dL (ref 5–40)

## 2023-12-06 NOTE — Progress Notes (Signed)
 Good day, please let Harold Tate know his labs have returned: Overall these are stable with exception of lipid panel where LDL, bad cholesterol, remains a little elevated above goal.  His is 112, would like to see <70.  Continue Zetia  and if you want to add Rosuvastatin  20 MG weekly let me know.  I know you had mentioned possibly trying this again at your visit.  Any questions? Keep being stellar!!  Thank you for allowing me to participate in your care.  I appreciate you. Kindest regards, Sharicka Pogorzelski

## 2023-12-08 LAB — DRUG SCREEN 764883 11+OXYCO+ALC+CRT-BUND
Amphetamines, Urine: NEGATIVE ng/mL
BENZODIAZ UR QL: NEGATIVE ng/mL
Barbiturate screen, urine: NEGATIVE ng/mL
Cannabinoid Quant, Ur: NEGATIVE ng/mL
Cocaine (Metab.): NEGATIVE ng/mL
Creatinine, Urine: 12 mg/dL — ABNORMAL LOW (ref 20.0–300.0)
Ethanol, Urine: NEGATIVE %
Meperidine: NEGATIVE ng/mL
Methadone Screen, Urine: NEGATIVE ng/mL
Nitrite Urine, Quantitative: NEGATIVE ug/mL
OPIATE SCREEN URINE: NEGATIVE ng/mL
Oxycodone/Oxymorphone, Urine: NEGATIVE ng/mL
PCP Quant, Ur: NEGATIVE ng/mL
Propoxyphene: NEGATIVE ng/mL
Tramadol: NEGATIVE ng/mL
pH, Urine: 6.3 (ref 4.5–8.9)

## 2023-12-08 LAB — SPECIFIC GRAVITY: Specific Gravity: 1.003

## 2023-12-08 MED ORDER — ROSUVASTATIN CALCIUM 5 MG PO TABS: 5.0000 mg | ORAL_TABLET | Freq: Every day | ORAL | 3 refills | Status: AC

## 2023-12-08 NOTE — Addendum Note (Signed)
 Addended by: Modesta Sammons T on: 12/08/2023 03:10 PM   Modules accepted: Orders

## 2023-12-08 NOTE — Progress Notes (Signed)
 Sent!

## 2023-12-16 ENCOUNTER — Ambulatory Visit
Admission: RE | Admit: 2023-12-16 | Discharge: 2023-12-16 | Disposition: A | Discharge status: Home / Self Care | Source: Ambulatory Visit | Attending: Nurse Practitioner | Admitting: Nurse Practitioner

## 2023-12-16 DIAGNOSIS — K7460 Unspecified cirrhosis of liver: Secondary | ICD-10-CM | POA: Diagnosis not present

## 2023-12-16 DIAGNOSIS — K7689 Other specified diseases of liver: Secondary | ICD-10-CM | POA: Diagnosis not present

## 2023-12-16 DIAGNOSIS — R932 Abnormal findings on diagnostic imaging of liver and biliary tract: Secondary | ICD-10-CM | POA: Diagnosis not present

## 2023-12-16 DIAGNOSIS — K7469 Other cirrhosis of liver: Secondary | ICD-10-CM | POA: Diagnosis present

## 2023-12-23 NOTE — Progress Notes (Signed)
 Contacted via MyChart  Liver ultrasound remains stable with ongoing cirrhosis noted, but no acute findings.  We will continue to monitor and please follow-up with GI.

## 2024-01-20 ENCOUNTER — Telehealth: Payer: Self-pay

## 2024-01-20 NOTE — Telephone Encounter (Signed)
 Medical records were sent to [facility] [place] office notes from 02/28/2020

## 2024-02-06 ENCOUNTER — Other Ambulatory Visit: Payer: Self-pay | Admitting: Nurse Practitioner

## 2024-02-10 NOTE — Telephone Encounter (Signed)
 Requested Prescriptions  Pending Prescriptions Disp Refills   sildenafil  (VIAGRA ) 100 MG tablet [Pharmacy Med Name: SILDENAFIL  100 MG TABLET] 5 tablet 2    Sig: Take 0.5-1 tablets (50-100 mgtotal) by mouth daily as needed for erectile dysfunction. Do not take on same day you take Cialis .     Urology: Erectile Dysfunction Agents Passed - 02/10/2024  7:50 AM      Passed - AST in normal range and within 360 days    AST  Date Value Ref Range Status  12/05/2023 22 0 - 40 IU/L Final   SGOT(AST)  Date Value Ref Range Status  01/28/2014 22 15 - 37 Unit/L Final         Passed - ALT in normal range and within 360 days    ALT  Date Value Ref Range Status  12/05/2023 22 0 - 44 IU/L Final   SGPT (ALT)  Date Value Ref Range Status  01/28/2014 36 U/L Final    Comment:    14-63 NOTE: New Reference Range 11/16/13    ALT (SGPT) P5P  Date Value Ref Range Status  10/31/2020 16 0 - 55 IU/L Final         Passed - Last BP in normal range    BP Readings from Last 1 Encounters:  12/05/23 134/64         Passed - Valid encounter within last 12 months    Recent Outpatient Visits           2 months ago Type 2 diabetes mellitus with obesity   [place] [facility] [place], [place] T, NP   5 months ago Type 2 diabetes mellitus with obesity   [facility] [place], [place] T, NP   8 months ago Type 2 diabetes mellitus with obesity   [place] [place], Melanie DASEN, NP

## 2024-03-06 ENCOUNTER — Other Ambulatory Visit: Payer: Self-pay | Admitting: Nurse Practitioner

## 2024-03-07 NOTE — Patient Instructions (Incomplete)

## 2024-03-08 NOTE — Telephone Encounter (Signed)
 Requested Prescriptions  Pending Prescriptions Disp Refills   tadalafil  (CIALIS ) 20 MG tablet [Pharmacy Med Name: TADALAFIL  20 MG TABLET] 10 tablet 0    Sig: Take 0.5 tablets (10 mg total) bymouth daily as needed for erectile dysfunction. Do not take on same day you take Viagra .     Urology: Erectile Dysfunction Agents Passed - 03/08/2024  3:55 PM      Passed - AST in normal range and within 360 days    AST  Date Value Ref Range Status  12/05/2023 22 0 - 40 IU/L Final   SGOT(AST)  Date Value Ref Range Status  01/28/2014 22 15 - 37 Unit/L Final         Passed - ALT in normal range and within 360 days    ALT  Date Value Ref Range Status  12/05/2023 22 0 - 44 IU/L Final   SGPT (ALT)  Date Value Ref Range Status  01/28/2014 36 U/L Final    Comment:    14-63 NOTE: New Reference Range 11/16/13    ALT (SGPT) P5P  Date Value Ref Range Status  10/31/2020 16 0 - 55 IU/L Final         Passed - Last BP in normal range    BP Readings from Last 1 Encounters:  12/05/23 134/64         Passed - Valid encounter within last 12 months    Recent Outpatient Visits           3 months ago Type 2 diabetes mellitus with obesity   [facility] [place], [place] T, NP   6 months ago Type 2 diabetes mellitus with obesity   [facility] [place], [place] T, NP   9 months ago Type 2 diabetes mellitus with obesity   [facility] [place], Melanie DASEN, NP

## 2024-03-09 ENCOUNTER — Ambulatory Visit: Admitting: Nurse Practitioner

## 2024-03-09 DIAGNOSIS — F17210 Nicotine dependence, cigarettes, uncomplicated: Secondary | ICD-10-CM

## 2024-03-09 DIAGNOSIS — E669 Obesity, unspecified: Secondary | ICD-10-CM

## 2024-03-09 DIAGNOSIS — I82532 Chronic embolism and thrombosis of left popliteal vein: Secondary | ICD-10-CM

## 2024-03-09 DIAGNOSIS — G114 Hereditary spastic paraplegia: Secondary | ICD-10-CM

## 2024-03-09 DIAGNOSIS — E1169 Type 2 diabetes mellitus with other specified complication: Secondary | ICD-10-CM

## 2024-03-09 DIAGNOSIS — F321 Major depressive disorder, single episode, moderate: Secondary | ICD-10-CM

## 2024-03-09 DIAGNOSIS — K7469 Other cirrhosis of liver: Secondary | ICD-10-CM

## 2024-03-09 DIAGNOSIS — F411 Generalized anxiety disorder: Secondary | ICD-10-CM

## 2024-03-09 DIAGNOSIS — I152 Hypertension secondary to endocrine disorders: Secondary | ICD-10-CM

## 2024-03-09 DIAGNOSIS — Z79899 Other long term (current) drug therapy: Secondary | ICD-10-CM

## 2024-03-10 ENCOUNTER — Encounter: Payer: Self-pay | Admitting: Nurse Practitioner

## 2024-03-10 ENCOUNTER — Ambulatory Visit (INDEPENDENT_AMBULATORY_CARE_PROVIDER_SITE_OTHER): Admitting: Nurse Practitioner

## 2024-03-10 VITALS — BP 127/77 | HR 67 | Temp 97.8°F | Ht 73.0 in | Wt 194.0 lb

## 2024-03-10 DIAGNOSIS — E1159 Type 2 diabetes mellitus with other circulatory complications: Secondary | ICD-10-CM

## 2024-03-10 DIAGNOSIS — E119 Type 2 diabetes mellitus without complications: Secondary | ICD-10-CM

## 2024-03-10 DIAGNOSIS — E1169 Type 2 diabetes mellitus with other specified complication: Secondary | ICD-10-CM | POA: Diagnosis not present

## 2024-03-10 DIAGNOSIS — K7469 Other cirrhosis of liver: Secondary | ICD-10-CM

## 2024-03-10 DIAGNOSIS — F17210 Nicotine dependence, cigarettes, uncomplicated: Secondary | ICD-10-CM

## 2024-03-10 DIAGNOSIS — I152 Hypertension secondary to endocrine disorders: Secondary | ICD-10-CM | POA: Diagnosis not present

## 2024-03-10 DIAGNOSIS — F411 Generalized anxiety disorder: Secondary | ICD-10-CM

## 2024-03-10 DIAGNOSIS — Z79899 Other long term (current) drug therapy: Secondary | ICD-10-CM

## 2024-03-10 DIAGNOSIS — F321 Major depressive disorder, single episode, moderate: Secondary | ICD-10-CM

## 2024-03-10 DIAGNOSIS — E669 Obesity, unspecified: Secondary | ICD-10-CM

## 2024-03-10 DIAGNOSIS — G114 Hereditary spastic paraplegia: Secondary | ICD-10-CM

## 2024-03-10 DIAGNOSIS — I82532 Chronic embolism and thrombosis of left popliteal vein: Secondary | ICD-10-CM

## 2024-03-10 DIAGNOSIS — E785 Hyperlipidemia, unspecified: Secondary | ICD-10-CM | POA: Diagnosis not present

## 2024-03-10 LAB — BAYER DCA HB A1C WAIVED: HB A1C (BAYER DCA - WAIVED): 5.7 % — ABNORMAL HIGH (ref 4.8–5.6)

## 2024-03-10 MED ORDER — EZETIMIBE 10 MG PO TABS: 10.0000 mg | ORAL_TABLET | Freq: Every day | ORAL | 3 refills | Status: AC

## 2024-03-10 MED ORDER — CLONAZEPAM 0.5 MG PO TABS: 0.5000 mg | ORAL_TABLET | Freq: Three times a day (TID) | ORAL | 2 refills | Status: AC | PRN

## 2024-03-10 NOTE — Patient Instructions (Signed)

## 2024-03-10 NOTE — Assessment & Plan Note (Signed)
Chronic, ongoing.  Denies SI/HI.   Refills sent on Klonopin, refer to anxiety plan.  Have recommended psychiatry visit, he refuses.  Has failed multiple maintenance medications. 

## 2024-03-10 NOTE — Assessment & Plan Note (Signed)
 Chronic, stable.  Followed by GI with recent imaging remaining stable.  He denies alcohol use.  Continue collaboration with GI, new referral placed. Needs colonoscopy as well.

## 2024-03-10 NOTE — Assessment & Plan Note (Signed)
 Chronic, stable with A1c remaining 5.6% last visit, recheck today. He had been very stable since 2022, then had recent upward trend which he has fixed with diet and exercise. Currently diet controlled.  Recommend he check blood sugar daily -- currently he is not doing this.  Discussed with him risks of high sugar levels.  Urine ALB 150 January 2025, refuses ACE/ARB. - Foot exam up to date.  Needs eye exam, highly recommend he obtain as he reports reading vision is worse. - Refuses all vaccines - Zetia  and statin on board, refuses ACE/ARB.

## 2024-03-10 NOTE — Assessment & Plan Note (Signed)
 Chronic, ongoing.  Recommend he continue Zetia  and Crestor  at this time, discussed at length.  Lipid panel today.

## 2024-03-10 NOTE — Assessment & Plan Note (Signed)
 Chronic, progressive.  Continue collaboration with [facility].  Baclofen  for discomfort as needed. Recommend referral to PT, referral in past and has not attended.

## 2024-03-10 NOTE — Assessment & Plan Note (Signed)
 Chronic, stable without medications.  BP at goal in office today.  Recommend he monitor BP at least a few mornings a week at home and document.  DASH diet at home.  Continue current medication regimen and adjust as needed.  Urine ALB 150 January 2025, refuses ACE/ARB. Labs today: CMP.

## 2024-03-10 NOTE — Assessment & Plan Note (Signed)
 Refer to anxiety plan of care.

## 2024-03-10 NOTE — Assessment & Plan Note (Signed)
 I have recommended complete cessation of tobacco use. I have discussed various options available for assistance with tobacco cessation including over the counter methods (Nicotine  gum, patch and lozenges). We also discussed prescription options (Chantix, Nicotine  Inhaler / Nasal Spray). The patient is not interested in pursuing any prescription tobacco cessation options at this time.  Recommend he return to lung cancer screening which he has not.

## 2024-03-10 NOTE — Assessment & Plan Note (Signed)
 Chronic, history of DVTs in the past.  Dicussed at length with him he may benefit from lifelong anticoagulant, this was recommended by hematology in the past.  Recent visit with vascular, okay to take ASA, but in future would benefit Xarelto  when cost effective. He has spoken to [facility] in past and not able to get assistance.  Continue collaboration with vascular. Recommend complete cessation of smoking. HIGHLY recommend he restart ASA as has not been taking, discussed at length with him.

## 2024-03-10 NOTE — Assessment & Plan Note (Signed)
 Chronic, ongoing.  Has been on benzo long term started by his previous PCP.  Discussed at length risks of long term benzo use, including risk for dementia.  He is fully aware of risks and wishes to continue, refuses psychiatry visit.  Refill sent #90 plus 2 refills.  Have HIGHLY recommended he not take more then ordered and that he trial a reduction of it, trial BID dosing.  Will have return to office in 3 months.  UDS due next on 12/04/24, contract up to date.

## 2024-03-10 NOTE — Progress Notes (Signed)
 BP 127/77   Pulse 67   Temp 97.8 F (36.6 C) (Oral)   Ht 6' 1 (1.854 m)   Wt 194 lb (88 kg)   SpO2 94%   BMI 25.60 kg/m    Subjective:    Patient ID: Harold Tate, male    DOB: 1965-11-29, 58 y.o.   MRN: [id number]  HPI: Harold Tate is a 58 y.o. male  Chief Complaint  Patient presents with   Diabetes   DIABETES August A1c 5.6%. Currently has been off medications for 6 months and focused on diet control. Took Farxiga  for short period in past. Hypoglycemic episodes:no Polydipsia/polyuria: no Visual disturbance: no Chest pain: no Paresthesias: no Glucose Monitoring: no  Accucheck frequency: Not Checking  Fasting glucose:  Post prandial:  Evening:  Before meals: Taking Insulin?: no  Long acting insulin:  Short acting insulin: Blood Pressure Monitoring: not checking Retinal Examination: Not up to Date - needs to schedule Foot Exam: Up to Date Pneumovax: Not up to Date -- refuses Influenza: Not up to Date -- refuses Aspirin: no   HYPERTENSION / HYPERLIPIDEMIA/ CIRRHOSIS + DVT Vascular last seen 07/15/23 with imaging stable, he is not taking ASA as recommended. They did report he can take this, but would consider low dose of Xarelto  for prophylaxis.  He has trouble affording prescription anticoagulants and reports he told vascular he would prefer to take ASA. Aortic atherosclerosis on imaging 12/27/20.    Saw [facility] neurology last for hereditary spastic paraparesis neurology on 09/26/23. To take Baclofen  TID PRN and they ordered OT/PT, which he has not pursued.  Saw GI last 07/24/21 for cirrhosis, has not returned since as his provider left practice. Imaging last on 12/16/23 remainded stable with no changes. He feels that the antibiotics he took for 2 years in past caused this. Denies heavy alcohol use in past, no current use of any alcohol. Continues to smoke, smokes about 1 PPD. Smoked since he was 58 years old.   Satisfied with current treatment? yes Duration of  hyperlipidemia: chronic Cholesterol medication side effects: no Cholesterol supplements: none Medication compliance: good compliance Aspirin: no Recent stressors: no Recurrent headaches: no Visual changes: no Palpitations: no Dyspnea: no Chest pain: no Lower extremity edema: no Dizzy/lightheaded: no   ANXIETY/STRESS Continues Clonazepam  0.5 MG TID ordered by previous PCP, Dr. Montell. Been on this for many years. Tried Xanax in past but was changed as anxiety became worse with this. Pt is aware of risks of benzo medication use to include increased sedation, respiratory suppression, falls, extrapyramidal movements, dependence and cardiovascular events. Pt would like to continue treatment as benefit determined to outweigh risk.   He is helping to care for his mother who has Alzheimer's. His father has been at the [facility] for 2 months for gas gangrene.    Tried Wellbutrin, Celexa , and Prozac  in past without success. Wellbutrin caused weird thoughts. PDMP last Klonopin  fill was 02/11/24. No other recent controlled substance fills.   Duration:stable Anxious mood: yes  Excessive worrying: no Irritability: no  Sweating: no Nausea: no Palpitations:no Hyperventilation: no Panic attacks: no Agoraphobia: no  Obscessions/compulsions: no Depressed mood: no    03/10/2024   11:42 AM 12/05/2023    1:31 PM 09/02/2023    1:25 PM 05/21/2023    3:04 PM 02/10/2023   10:27 AM  Depression screen PHQ 2/9  Decreased Interest 0 0 0 1 0  Down, Depressed, Hopeless 0 0 0 0 0  PHQ - 2 Score 0 0  0 1 0  Altered sleeping 0 0 0 0 0  Tired, decreased energy 2 0 0 3 0  Change in appetite 0 0 0 0 3  Feeling bad or failure about yourself  0 0 0 1 0  Trouble concentrating 0 0 0 0 0  Moving slowly or fidgety/restless 0 0 0 0 0  Suicidal thoughts 0 0 0 0 0  PHQ-9 Score 2 0  0  5  3   Difficult doing work/chores Somewhat difficult Not difficult at all Not difficult at all Somewhat difficult      Data saved with a  previous flowsheet row definition  Anhedonia: no Weight changes: no Insomnia: none Hypersomnia: no Fatigue/loss of energy: no Feelings of worthlessness: no Feelings of guilt: no Impaired concentration/indecisiveness: no Suicidal ideations: no  Crying spells: no Recent Stressors/Life Changes: no   Relationship problems: no   Family stress: no     Financial stress: no    Job stress: no    Recent death/loss: no     2024-03-14   11:42 AM 12/05/2023    1:31 PM 09/02/2023    1:25 PM 05/21/2023    3:05 PM  GAD 7 : Generalized Anxiety Score  Nervous, Anxious, on Edge 2 0 1 3  Control/stop worrying 0 0 0 1  Worry too much - different things 0 0 0 3  Trouble relaxing 0 0 0 0  Restless 0 0 0 0  Easily annoyed or irritable 0 1 0 0  Afraid - awful might happen 0 0 0 1  Total GAD 7 Score 2 1 1 8   Anxiety Difficulty  Somewhat difficult Not difficult at all Somewhat difficult   Relevant past medical, surgical, family and social history reviewed and updated as indicated. Interim medical history since our last visit reviewed. Allergies and medications reviewed and updated.  Review of Systems  Constitutional:  Negative for activity change, diaphoresis, fatigue and fever.  Respiratory:  Negative for cough, chest tightness, shortness of breath and wheezing.   Cardiovascular:  Negative for chest pain, palpitations and leg swelling.  Gastrointestinal: Negative.   Endocrine: Negative for cold intolerance, heat intolerance, polydipsia, polyphagia and polyuria.  Neurological: Negative.   Psychiatric/Behavioral:  Negative for decreased concentration, self-injury, sleep disturbance and suicidal ideas. The patient is nervous/anxious.    Per HPI unless specifically indicated above     Objective:    BP 127/77   Pulse 67   Temp 97.8 F (36.6 C) (Oral)   Ht 6' 1 (1.854 m)   Wt 194 lb (88 kg)   SpO2 94%   BMI 25.60 kg/m   Wt Readings from Last 3 Encounters:  2024/03/14 194 lb (88 kg)  12/05/23  186 lb 12.8 oz (84.7 kg)  10/23/23 194 lb (88 kg)    Physical Exam Vitals and nursing note reviewed.  Constitutional:      General: He is awake. He is not in acute distress.    Appearance: He is well-developed and well-groomed. He is not ill-appearing or toxic-appearing.  HENT:     Head: Normocephalic.     Right Ear: Hearing and external ear normal.     Left Ear: Hearing and external ear normal.  Eyes:     General: Lids are normal.     Extraocular Movements: Extraocular movements intact.     Conjunctiva/sclera: Conjunctivae normal.  Neck:     Thyroid: No thyromegaly.     Vascular: No carotid bruit.  Cardiovascular:     Rate  and Rhythm: Normal rate and regular rhythm.     Heart sounds: Normal heart sounds. No murmur heard.    No gallop.  Pulmonary:     Effort: No accessory muscle usage or respiratory distress.     Breath sounds: Normal breath sounds.  Abdominal:     General: Bowel sounds are normal. There is no distension.     Palpations: Abdomen is soft.     Tenderness: There is no abdominal tenderness.  Musculoskeletal:     Cervical back: Full passive range of motion without pain.     Right lower leg: No edema.     Left lower leg: No edema.  Lymphadenopathy:     Cervical: No cervical adenopathy.  Skin:    General: Skin is warm.     Capillary Refill: Capillary refill takes less than 2 seconds.  Neurological:     Mental Status: He is alert and oriented to person, place, and time.     Deep Tendon Reflexes: Reflexes are normal and symmetric.     Reflex Scores:      Brachioradialis reflexes are 2+ on the right side and 2+ on the left side.      Patellar reflexes are 2+ on the right side and 2+ on the left side. Psychiatric:        Attention and Perception: Attention normal.        Mood and Affect: Mood normal.        Speech: Speech normal.        Behavior: Behavior normal. Behavior is cooperative.        Thought Content: Thought content normal.    Results for orders  placed or performed in visit on 12/05/23  [id number] 11+Oxyco+Alc+Crt-Bund   Collection Time: 12/05/23  1:31 PM  Result Value Ref Range   Amphetamines, Urine Negative Cutoff=1000 ng/mL   Barbiturate screen, urine Negative Cutoff=200 ng/mL   BENZODIAZ UR QL Negative Cutoff=200 ng/mL   Cannabinoid Quant, Ur Negative Cutoff=50 ng/mL   Cocaine (Metab.) Negative Cutoff=300 ng/mL   OPIATE SCREEN URINE Negative Cutoff=300 ng/mL   Oxycodone /Oxymorphone, Urine Negative Cutoff=300 ng/mL   PCP Quant, Ur Negative Cutoff=25 ng/mL   Methadone Screen, Urine Negative Cutoff=300 ng/mL   Propoxyphene Negative Cutoff=300 ng/mL   Meperidine  Negative Cutoff=200 ng/mL   Tramadol  Negative Cutoff=200 ng/mL   Ethanol, Urine Negative Cutoff=0.020 %   Creatinine, Urine 12.0 (L) 20.0 - 300.0 mg/dL   Nitrite Urine, Quantitative Negative Cutoff=200 mcg/mL   pH, Urine 6.3 4.5 - 8.9  Specific Gravity   Collection Time: 12/05/23  1:31 PM  Result Value Ref Range   Specific Gravity 1.0030   Bayer DCA Hb A1c Waived   Collection Time: 12/05/23  1:32 PM  Result Value Ref Range   HB A1C (BAYER DCA - WAIVED) 5.6 4.8 - 5.6 %  CBC with Differential/Platelet   Collection Time: 12/05/23  1:32 PM  Result Value Ref Range   WBC 6.2 3.4 - 10.8 x10E3/uL   RBC 4.59 4.14 - 5.80 x10E6/uL   Hemoglobin 15.2 13.0 - 17.7 g/dL   Hematocrit 53.7 62.4 - 51.0 %   MCV 101 (H) 79 - 97 fL   MCH 33.1 (H) 26.6 - 33.0 pg   MCHC 32.9 31.5 - 35.7 g/dL   RDW 86.4 88.3 - 84.5 %   Platelets 155 150 - 450 x10E3/uL   Neutrophils 54 Not Estab. %   Lymphs 29 Not Estab. %   Monocytes 8 Not Estab. %   Eos 7 Not  Estab. %   Basos 2 Not Estab. %   Neutrophils Absolute 3.3 1.4 - 7.0 x10E3/uL   Lymphocytes Absolute 1.8 0.7 - 3.1 x10E3/uL   Monocytes Absolute 0.5 0.1 - 0.9 x10E3/uL   EOS (ABSOLUTE) 0.5 (H) 0.0 - 0.4 x10E3/uL   Basophils Absolute 0.1 0.0 - 0.2 x10E3/uL   Immature Granulocytes 0 Not Estab. %   Immature Grans (Abs) 0.0 0.0 - 0.1 x10E3/uL   Comprehensive metabolic panel with GFR   Collection Time: 12/05/23  1:32 PM  Result Value Ref Range   Glucose 105 (H) 70 - 99 mg/dL   BUN 15 6 - 24 mg/dL   Creatinine, Ser 9.08 0.76 - 1.27 mg/dL   eGFR 98 >40 fO/fpw/8.26   BUN/Creatinine Ratio 16 9 - 20   Sodium 139 134 - 144 mmol/L   Potassium 4.4 3.5 - 5.2 mmol/L   Chloride 99 96 - 106 mmol/L   CO2 21 20 - 29 mmol/L   Calcium  9.7 8.7 - 10.2 mg/dL   Total Protein 7.2 6.0 - 8.5 g/dL   Albumin 4.5 3.8 - 4.9 g/dL   Globulin, Total 2.7 1.5 - 4.5 g/dL   Bilirubin Total 0.5 0.0 - 1.2 mg/dL   Alkaline Phosphatase 133 (H) 44 - 121 IU/L   AST 22 0 - 40 IU/L   ALT 22 0 - 44 IU/L  Lipid Panel w/o Chol/HDL Ratio   Collection Time: 12/05/23  1:32 PM  Result Value Ref Range   Cholesterol, Total 184 100 - 199 mg/dL   Triglycerides 67 0 - 149 mg/dL   HDL 59 >60 mg/dL   VLDL Cholesterol Cal 13 5 - 40 mg/dL   LDL Chol Calc (NIH) 887 (H) 0 - 99 mg/dL      Assessment & Plan:   Problem List Items Addressed This Visit       Cardiovascular and Mediastinum   Hypertension associated with diabetes (HCC)   Chronic, stable without medications.  BP at goal in office today.  Recommend he monitor BP at least a few mornings a week at home and document.  DASH diet at home.  Continue current medication regimen and adjust as needed.  Urine ALB 150 January 2025, refuses ACE/ARB. Labs today: CMP.      Relevant Medications   ezetimibe  (ZETIA ) 10 MG tablet   Other Relevant Orders   Bayer DCA Hb A1c Waived   Chronic deep vein thrombosis (DVT) of popliteal vein of left lower extremity (HCC)   Chronic, history of DVTs in the past.  Dicussed at length with him he may benefit from lifelong anticoagulant, this was recommended by hematology in the past.  Recent visit with vascular, okay to take ASA, but in future would benefit Xarelto  when cost effective. He has spoken to [place] [facility] in past and not able to get assistance.  Continue collaboration with vascular.  Recommend complete cessation of smoking. HIGHLY recommend he restart ASA as has not been taking, discussed at length with him.      Relevant Medications   ezetimibe  (ZETIA ) 10 MG tablet     Digestive   Cirrhosis of liver (HCC)   Chronic, stable.  Followed by GI with recent imaging remaining stable.  He denies alcohol use.  Continue collaboration with GI, new referral placed. Needs colonoscopy as well.      Relevant Orders   Ambulatory referral to Gastroenterology     Endocrine   Type 2 diabetes mellitus in patient with obesity (HCC) - Primary  Chronic, stable with A1c remaining 5.6% last visit, recheck today. He had been very stable since 2022, then had recent upward trend which he has fixed with diet and exercise. Currently diet controlled.  Recommend he check blood sugar daily -- currently he is not doing this.  Discussed with him risks of high sugar levels.  Urine ALB 150 January 2025, refuses ACE/ARB. - Foot exam up to date.  Needs eye exam, highly recommend he obtain as he reports reading vision is worse. - Refuses all vaccines - Zetia  and statin on board, refuses ACE/ARB.      Relevant Orders   Bayer DCA Hb A1c Waived   Hyperlipidemia associated with type 2 diabetes mellitus (HCC)   Chronic, ongoing.  Recommend he continue Zetia  and Crestor  at this time, discussed at length.  Lipid panel today.      Relevant Medications   ezetimibe  (ZETIA ) 10 MG tablet   Other Relevant Orders   Bayer DCA Hb A1c Waived   Comprehensive metabolic panel with GFR   Lipid Panel w/o Chol/HDL Ratio     Nervous and Auditory   Hereditary spastic paraparesis (HCC)   Chronic, progressive.  Continue collaboration with [facility].  Baclofen  for discomfort as needed. Recommend referral to PT, referral in past and has not attended.          Other   Generalized anxiety disorder (Chronic)   Chronic, ongoing.  Has been on benzo long term started by his previous PCP.  Discussed at length risks of long term  benzo use, including risk for dementia.  He is fully aware of risks and wishes to continue, refuses psychiatry visit.  Refill sent #90 plus 2 refills.  Have HIGHLY recommended he not take more then ordered and that he trial a reduction of it, trial BID dosing.  Will have return to office in 3 months.  UDS due next on 12/04/24, contract up to date.      Relevant Medications   clonazePAM  (KLONOPIN ) 0.5 MG tablet   Nicotine  dependence, cigarettes, uncomplicated   I have recommended complete cessation of tobacco use. I have discussed various options available for assistance with tobacco cessation including over the counter methods (Nicotine  gum, patch and lozenges). We also discussed prescription options (Chantix, Nicotine  Inhaler / Nasal Spray). The patient is not interested in pursuing any prescription tobacco cessation options at this time.  Recommend he return to lung cancer screening which he has not.       Long-term current use of benzodiazepine   Refer to anxiety plan of care.      Depression, major, single episode, moderate (HCC)   Chronic, ongoing.  Denies SI/HI.   Refills sent on Klonopin , refer to anxiety plan.  Have recommended psychiatry visit, he refuses.  Has failed multiple maintenance medications.         Follow up plan: Return in about 3 months (around 06/10/2024) for ANXIETY for refills.

## 2024-03-11 ENCOUNTER — Ambulatory Visit: Payer: Self-pay | Admitting: Nurse Practitioner

## 2024-03-11 LAB — COMPREHENSIVE METABOLIC PANEL WITH GFR
ALT: 17 IU/L (ref 0–44)
AST: 15 IU/L (ref 0–40)
Albumin: 4 g/dL (ref 3.8–4.9)
Alkaline Phosphatase: 96 IU/L (ref 47–123)
BUN/Creatinine Ratio: 13 (ref 9–20)
BUN: 12 mg/dL (ref 6–24)
Bilirubin Total: 0.5 mg/dL (ref 0.0–1.2)
CO2: 24 mmol/L (ref 20–29)
Calcium: 9.4 mg/dL (ref 8.7–10.2)
Chloride: 103 mmol/L (ref 96–106)
Creatinine, Ser: 0.89 mg/dL (ref 0.76–1.27)
Globulin, Total: 3 g/dL (ref 1.5–4.5)
Glucose: 139 mg/dL — ABNORMAL HIGH (ref 70–99)
Potassium: 4.4 mmol/L (ref 3.5–5.2)
Sodium: 137 mmol/L (ref 134–144)
Total Protein: 7 g/dL (ref 6.0–8.5)
eGFR: 99 mL/min/1.73 (ref >59)

## 2024-03-11 LAB — LIPID PANEL W/O CHOL/HDL RATIO
Cholesterol, Total: 201 mg/dL — ABNORMAL HIGH (ref 100–199)
HDL: 48 mg/dL (ref >39)
LDL Chol Calc (NIH): 133 mg/dL — ABNORMAL HIGH (ref 0–99)
Triglycerides: 109 mg/dL (ref 0–149)
VLDL Cholesterol Cal: 20 mg/dL (ref 5–40)

## 2024-03-11 NOTE — Progress Notes (Signed)
 Contacted via MyChart  Good morning Harold Tate, your labs have returned: - Kidney function, creatinine and eGFR, remains normal, as is liver function, AST and ALT.  - Lipid panel is showing elevations placing you at risk for stroke or heart attack, please try to take your statin (Rosuvastatin ) and Zetia  as ordered. Any questions? Keep being amazing!!  Thank you for allowing me to participate in your care.  I appreciate you. Kindest regards, Meng [place]

## 2024-04-02 ENCOUNTER — Other Ambulatory Visit: Payer: Self-pay | Admitting: Nurse Practitioner

## 2024-04-05 NOTE — Telephone Encounter (Signed)
 Requested medication (s) are due for refill today:Yes  Requested medication (s) are on the active medication list:Yes  Last refill:  03/08/2024  Future visit if scheduled: 06/11/2024    Requested Renewals   Name from pharmacy: TADALAFIL  20 MG TABLET       Will file in chart as: tadalafil  (CIALIS ) 20 MG tablet   Sig: Take 0.5 tablets (10 mg total) by mouth daily as needed for erectile dysfunction. Do not take on same day you take Viagra .   Disp: 10 tablet    Refills: 0   Start: 04/02/2024   Class: Normal   Non-formulary   Last ordered: 4 weeks ago (03/08/2024) by Melanie ONEIDA Polio, NP   Last refill: 03/08/2024   Rx #: [id number]   Urology: Erectile Dysfunction Agents Passed12/08/2023 04:26 PM  Protocol Details AST in normal range and within 360 days   ALT in normal range and within 360 days   Last BP in normal range   Valid encounter within last 12 months    To be filled at: [facility] - [place], [place] - [address]

## 2024-05-06 ENCOUNTER — Ambulatory Visit (INDEPENDENT_AMBULATORY_CARE_PROVIDER_SITE_OTHER): Admitting: Gastroenterology

## 2024-05-06 VITALS — BP 143/80 | HR 67 | Temp 98.1°F | Ht 73.0 in | Wt 196.0 lb

## 2024-05-06 DIAGNOSIS — K7030 Alcoholic cirrhosis of liver without ascites: Secondary | ICD-10-CM

## 2024-05-06 DIAGNOSIS — Z8719 Personal history of other diseases of the digestive system: Secondary | ICD-10-CM | POA: Diagnosis not present

## 2024-05-06 DIAGNOSIS — Z860100 Personal history of colon polyps, unspecified: Secondary | ICD-10-CM

## 2024-05-06 DIAGNOSIS — D122 Benign neoplasm of ascending colon: Secondary | ICD-10-CM

## 2024-05-06 DIAGNOSIS — F1091 Alcohol use, unspecified, in remission: Secondary | ICD-10-CM

## 2024-05-06 DIAGNOSIS — I8500 Esophageal varices without bleeding: Secondary | ICD-10-CM

## 2024-05-06 DIAGNOSIS — K7460 Unspecified cirrhosis of liver: Secondary | ICD-10-CM

## 2024-05-06 NOTE — Patient Instructions (Addendum)
 Your ultrasound has been scheduled for January 12th  you must arrive at   8:30AM.  You can not have anything to eat or drink after 12 midnight the day before.  If you need to cancel/reschedule ultrasound, pleas call scheduling directly at [phone]

## 2024-05-06 NOTE — Addendum Note (Signed)
 Addended by: LANNIE BRAULIO GRADE on: 05/06/2024 04:52 PM   Modules accepted: Orders

## 2024-05-06 NOTE — Progress Notes (Signed)
 "   Primary Care Physician: Valerio Melanie DASEN, NP  Primary Gastroenterologist:  Dr. Clotilda Schaffer  Chief Complaint  Patient presents with   Transfer Care    Previous Dr Jinny patient    Cirrhosis    HPI: Harold Tate is a 59 y.o. male with a history of alcoholic cirrhosis, stably sober for several years.  He became sober just before his diagnosis of cirrhosis.  He feels that his alcohol intake was not the sole reason for the development of cirrhosis, and theorizes that several years of antibiotic use contributed.  I can also see in the chart that he has been advised to avoid iron, however he does not carry a diagnosis of hereditary hemochromatosis, and I assume this was just general liver health advice.  His last labs show AST 15, ALT 17, alkaline phosphatase 96 and total bilirubin 0.5.  Platelets in August 2025 were 155. His last ultrasound was in 8/25, showing a nodular contour consistent with cirrhosis, and no masses. His last EGD was in 8/22, showing grade 1 varices. His last colonoscopy was also in 2022, when 6 polyps were removed, the largest a 10 mm adenomatous polyp.  He was recommended for repeat colonoscopy in 3 years.  Past Medical History:  Diagnosis Date   Anxiety    Arthritis    Diabetes mellitus without complication (HCC)    Patient states A1C has gone down, no longer a diabetic   Dyspnea    Hip pain    Hypertension    Raynaud's syndrome    Sleep apnea     CPAP/ Doesn't use    Current Outpatient Medications  Medication Sig Dispense Refill   baclofen  (LIORESAL ) 10 MG tablet Take 20 mg by mouth 3 (three) times daily as needed.     clonazePAM  (KLONOPIN ) 0.5 MG tablet Take 1 tablet (0.5 mg total) by mouth 3 (three) times daily as needed for anxiety. 90 tablet 2   ezetimibe  (ZETIA ) 10 MG tablet Take 1 tablet (10 mg total) by mouth daily. 90 tablet 3   rosuvastatin  (CRESTOR ) 5 MG tablet Take 1 tablet (5 mg total) by mouth daily. 90 tablet 3   sildenafil  (VIAGRA ) 100  MG tablet Take 0.5-1 tablets (50-100 mgtotal) by mouth daily as needed for erectile dysfunction. Do not take on same day you take Cialis . 5 tablet 2   tadalafil  (CIALIS ) 20 MG tablet Take 0.5 tablets (10 mg total) by mouth daily as needed for erectile dysfunction. Do not take on same day you take Viagra . 10 tablet 0   No current facility-administered medications for this visit.    Allergies as of 05/06/2024 - Review Complete 03/10/2024  Allergen Reaction Noted   Metformin  and related Diarrhea 06/27/2023   Chlorhexidine  Rash 02/11/2019    ROS:  General: Negative for anorexia, weight loss, fever, chills, fatigue, weakness. ENT: Negative for hoarseness, difficulty swallowing , nasal congestion. CV: Negative for chest pain, angina, palpitations, dyspnea on exertion, peripheral edema.  Respiratory: Negative for dyspnea at rest, dyspnea on exertion, cough, sputum, wheezing.  GI: See history of present illness. GU:  Negative for dysuria, hematuria, urinary incontinence, urinary frequency, nocturnal urination.  Endo: Negative for unusual weight change.    Physical Examination:   There were no vitals taken for this visit.  General: Well-nourished, well-developed in no acute distress.  Eyes: No icterus. Conjunctivae pink. Lungs: Clear to auscultation bilaterally. Non-labored. Heart: Regular rate and rhythm, no murmurs rubs or gallops.  Abdomen: Bowel sounds are normal, nontender,  nondistended, no hepatosplenomegaly or masses, no abdominal bruits or hernia , no rebound or guarding.   Extremities: No lower extremity edema. No clubbing or deformities. Neuro: Alert and oriented x 3.  Grossly intact. Skin: Warm and dry, no jaundice.   Psych: Alert and cooperative, normal mood and affect.  Labs:    Imaging Studies: No results found.  Assessment and Plan:   ELIO HADEN is a 59 y.o. y/o male with alcoholic cirrhosis, sober for several years, with a history of grade 1 varices, portal  hypertensive gastropathy, and colon polyps.  Plan updated ultrasound, EGD (planned for every 2 years) and colonoscopy.  Will also order iron labs as he has been exhorted to avoid iron in the past, and he remains unconvinced that his level of alcohol intake was adequate for the development of cirrhosis.     Clotilda Schaffer, MD    Note: This dictation was prepared with Dragon dictation along with smaller phrase technology. Any transcriptional errors that result from this process are unintentional.   "

## 2024-05-07 ENCOUNTER — Other Ambulatory Visit: Payer: Self-pay | Admitting: Nurse Practitioner

## 2024-05-07 ENCOUNTER — Other Ambulatory Visit

## 2024-05-07 DIAGNOSIS — K7469 Other cirrhosis of liver: Secondary | ICD-10-CM

## 2024-05-08 ENCOUNTER — Ambulatory Visit: Payer: Self-pay | Admitting: Nurse Practitioner

## 2024-05-08 LAB — CBC WITH DIFFERENTIAL/PLATELET
Basophils Absolute: 0.1 x10E3/uL (ref 0.0–0.2)
Basos: 2 %
EOS (ABSOLUTE): 0.2 x10E3/uL (ref 0.0–0.4)
Eos: 4 %
Hematocrit: 46.1 % (ref 37.5–51.0)
Hemoglobin: 15.1 g/dL (ref 13.0–17.7)
Immature Grans (Abs): 0 x10E3/uL (ref 0.0–0.1)
Immature Granulocytes: 0 %
Lymphocytes Absolute: 1.6 x10E3/uL (ref 0.7–3.1)
Lymphs: 28 %
MCH: 32.5 pg (ref 26.6–33.0)
MCHC: 32.8 g/dL (ref 31.5–35.7)
MCV: 99 fL — ABNORMAL HIGH (ref 79–97)
Monocytes Absolute: 0.6 x10E3/uL (ref 0.1–0.9)
Monocytes: 11 %
Neutrophils Absolute: 3.1 x10E3/uL (ref 1.4–7.0)
Neutrophils: 55 %
Platelets: 147 x10E3/uL — ABNORMAL LOW (ref 150–450)
RBC: 4.65 x10E6/uL (ref 4.14–5.80)
RDW: 13 % (ref 11.6–15.4)
WBC: 5.6 x10E3/uL (ref 3.4–10.8)

## 2024-05-08 LAB — IRON,TIBC AND FERRITIN PANEL
Ferritin: 66 ng/mL (ref 30–400)
Iron Saturation: 20 % (ref 15–55)
Iron: 67 ug/dL (ref 38–169)
Total Iron Binding Capacity: 332 ug/dL (ref 250–450)
UIBC: 265 ug/dL (ref 111–343)

## 2024-05-08 NOTE — Progress Notes (Signed)
 Good morning Dr. Melany, here are the labs you had requested for [facility]. He came to our office to have them drawn. He told me he had a great visit with you.

## 2024-05-10 ENCOUNTER — Ambulatory Visit
Admission: RE | Admit: 2024-05-10 | Discharge: 2024-05-10 | Disposition: A | Discharge status: Home / Self Care | Source: Ambulatory Visit | Attending: Gastroenterology | Admitting: Gastroenterology

## 2024-05-10 DIAGNOSIS — K7460 Unspecified cirrhosis of liver: Secondary | ICD-10-CM | POA: Diagnosis present

## 2024-05-11 ENCOUNTER — Ambulatory Visit: Payer: Self-pay | Admitting: Gastroenterology

## 2024-06-09 ENCOUNTER — Ambulatory Visit: Admission: RE | Admit: 2024-06-09 | Source: Home / Self Care | Admitting: Gastroenterology

## 2024-06-09 ENCOUNTER — Encounter: Admission: RE | Payer: Self-pay | Source: Home / Self Care

## 2024-06-11 ENCOUNTER — Ambulatory Visit: Admitting: Nurse Practitioner

## 2024-07-13 ENCOUNTER — Ambulatory Visit (INDEPENDENT_AMBULATORY_CARE_PROVIDER_SITE_OTHER): Admitting: Vascular Surgery

## 2024-07-13 ENCOUNTER — Encounter (INDEPENDENT_AMBULATORY_CARE_PROVIDER_SITE_OTHER)
